# Patient Record
Sex: Female | Born: 1960 | Race: Black or African American | Hispanic: No | Marital: Married | Smoking: Current every day smoker
Health system: Southern US, Community
[De-identification: ages and names within clinical notes are randomized; demographics above are authoritative.]

## PROBLEM LIST (undated history)

## (undated) DIAGNOSIS — J189 Pneumonia, unspecified organism: Secondary | ICD-10-CM

## (undated) DIAGNOSIS — I340 Nonrheumatic mitral (valve) insufficiency: Secondary | ICD-10-CM

## (undated) DIAGNOSIS — N946 Dysmenorrhea, unspecified: Secondary | ICD-10-CM

## (undated) DIAGNOSIS — E119 Type 2 diabetes mellitus without complications: Secondary | ICD-10-CM

## (undated) DIAGNOSIS — I10 Essential (primary) hypertension: Secondary | ICD-10-CM

## (undated) DIAGNOSIS — I38 Endocarditis, valve unspecified: Secondary | ICD-10-CM

## (undated) DIAGNOSIS — F32A Depression, unspecified: Secondary | ICD-10-CM

## (undated) DIAGNOSIS — F329 Major depressive disorder, single episode, unspecified: Secondary | ICD-10-CM

## (undated) DIAGNOSIS — D509 Iron deficiency anemia, unspecified: Secondary | ICD-10-CM

## (undated) DIAGNOSIS — M1990 Unspecified osteoarthritis, unspecified site: Secondary | ICD-10-CM

## (undated) DIAGNOSIS — N920 Excessive and frequent menstruation with regular cycle: Secondary | ICD-10-CM

## (undated) HISTORY — PX: HAMMER TOE SURGERY: SHX385

## (undated) HISTORY — DX: Essential (primary) hypertension: I10

## (undated) HISTORY — DX: Iron deficiency anemia, unspecified: D50.9

## (undated) HISTORY — PX: KNEE ARTHROSCOPY: SUR90

## (undated) HISTORY — DX: Depression, unspecified: F32.A

## (undated) HISTORY — DX: Major depressive disorder, single episode, unspecified: F32.9

## (undated) HISTORY — DX: Nonrheumatic mitral (valve) insufficiency: I34.0

---

## 1997-10-29 ENCOUNTER — Inpatient Hospital Stay (HOSPITAL_COMMUNITY): Admission: EM | Admit: 1997-10-29 | Discharge: 1997-10-31 | Payer: Self-pay | Admitting: Emergency Medicine

## 1997-11-08 ENCOUNTER — Encounter: Admission: RE | Admit: 1997-11-08 | Discharge: 1997-11-08 | Payer: Self-pay | Admitting: Hematology and Oncology

## 1999-07-16 ENCOUNTER — Inpatient Hospital Stay (HOSPITAL_COMMUNITY): Admission: EM | Admit: 1999-07-16 | Discharge: 1999-08-01 | Payer: Self-pay | Admitting: Emergency Medicine

## 1999-07-16 ENCOUNTER — Encounter: Payer: Self-pay | Admitting: Emergency Medicine

## 1999-07-17 ENCOUNTER — Encounter: Payer: Self-pay | Admitting: Pulmonary Disease

## 1999-07-17 ENCOUNTER — Encounter: Payer: Self-pay | Admitting: Critical Care Medicine

## 1999-07-18 ENCOUNTER — Encounter: Payer: Self-pay | Admitting: Critical Care Medicine

## 1999-07-18 ENCOUNTER — Encounter: Payer: Self-pay | Admitting: Pulmonary Disease

## 1999-07-19 ENCOUNTER — Encounter: Payer: Self-pay | Admitting: Critical Care Medicine

## 1999-07-20 ENCOUNTER — Encounter: Payer: Self-pay | Admitting: Critical Care Medicine

## 1999-07-21 ENCOUNTER — Encounter: Payer: Self-pay | Admitting: Pulmonary Disease

## 1999-07-22 ENCOUNTER — Encounter: Payer: Self-pay | Admitting: Pulmonary Disease

## 1999-07-22 ENCOUNTER — Encounter: Payer: Self-pay | Admitting: Critical Care Medicine

## 1999-07-23 ENCOUNTER — Encounter: Payer: Self-pay | Admitting: Pulmonary Disease

## 1999-07-24 ENCOUNTER — Encounter: Payer: Self-pay | Admitting: Critical Care Medicine

## 1999-07-25 ENCOUNTER — Encounter: Payer: Self-pay | Admitting: Pulmonary Disease

## 1999-07-26 ENCOUNTER — Encounter: Payer: Self-pay | Admitting: Critical Care Medicine

## 1999-07-27 ENCOUNTER — Encounter: Payer: Self-pay | Admitting: Pulmonary Disease

## 1999-07-29 ENCOUNTER — Encounter: Payer: Self-pay | Admitting: Pulmonary Disease

## 1999-08-09 ENCOUNTER — Encounter: Payer: Self-pay | Admitting: Family Medicine

## 1999-08-09 ENCOUNTER — Ambulatory Visit (HOSPITAL_COMMUNITY): Admission: RE | Admit: 1999-08-09 | Discharge: 1999-08-09 | Payer: Self-pay | Admitting: Family Medicine

## 1999-09-27 ENCOUNTER — Encounter: Payer: Self-pay | Admitting: Internal Medicine

## 1999-09-27 ENCOUNTER — Ambulatory Visit (HOSPITAL_COMMUNITY): Admission: RE | Admit: 1999-09-27 | Discharge: 1999-09-27 | Payer: Self-pay | Admitting: Internal Medicine

## 2000-07-06 ENCOUNTER — Other Ambulatory Visit: Admission: RE | Admit: 2000-07-06 | Discharge: 2000-07-06 | Payer: Self-pay | Admitting: Family Medicine

## 2001-04-11 ENCOUNTER — Inpatient Hospital Stay (HOSPITAL_COMMUNITY): Admission: AD | Admit: 2001-04-11 | Discharge: 2001-04-14 | Payer: Self-pay | Admitting: *Deleted

## 2001-09-12 ENCOUNTER — Emergency Department (HOSPITAL_COMMUNITY): Admission: EM | Admit: 2001-09-12 | Discharge: 2001-09-12 | Payer: Self-pay | Admitting: Emergency Medicine

## 2001-10-29 ENCOUNTER — Emergency Department (HOSPITAL_COMMUNITY): Admission: EM | Admit: 2001-10-29 | Discharge: 2001-10-29 | Payer: Self-pay | Admitting: Emergency Medicine

## 2002-01-12 ENCOUNTER — Emergency Department (HOSPITAL_COMMUNITY): Admission: EM | Admit: 2002-01-12 | Discharge: 2002-01-12 | Payer: Self-pay | Admitting: Unknown Physician Specialty

## 2002-01-12 ENCOUNTER — Encounter: Payer: Self-pay | Admitting: Emergency Medicine

## 2002-01-30 ENCOUNTER — Encounter: Payer: Self-pay | Admitting: *Deleted

## 2002-01-30 ENCOUNTER — Emergency Department (HOSPITAL_COMMUNITY): Admission: EM | Admit: 2002-01-30 | Discharge: 2002-01-30 | Payer: Self-pay | Admitting: *Deleted

## 2002-02-06 ENCOUNTER — Ambulatory Visit (HOSPITAL_COMMUNITY): Admission: RE | Admit: 2002-02-06 | Discharge: 2002-02-06 | Payer: Self-pay | Admitting: Family Medicine

## 2002-02-06 ENCOUNTER — Encounter: Payer: Self-pay | Admitting: Family Medicine

## 2002-03-22 ENCOUNTER — Encounter: Admission: RE | Admit: 2002-03-22 | Discharge: 2002-04-21 | Payer: Self-pay | Admitting: Orthopedic Surgery

## 2002-04-01 ENCOUNTER — Emergency Department (HOSPITAL_COMMUNITY): Admission: EM | Admit: 2002-04-01 | Discharge: 2002-04-01 | Payer: Self-pay | Admitting: Emergency Medicine

## 2002-05-02 ENCOUNTER — Encounter: Payer: Self-pay | Admitting: Internal Medicine

## 2002-05-02 ENCOUNTER — Ambulatory Visit (HOSPITAL_COMMUNITY): Admission: RE | Admit: 2002-05-02 | Discharge: 2002-05-02 | Payer: Self-pay | Admitting: Internal Medicine

## 2002-05-11 ENCOUNTER — Ambulatory Visit (HOSPITAL_BASED_OUTPATIENT_CLINIC_OR_DEPARTMENT_OTHER): Admission: RE | Admit: 2002-05-11 | Discharge: 2002-05-11 | Payer: Self-pay | Admitting: Orthopedic Surgery

## 2002-05-26 ENCOUNTER — Encounter: Admission: RE | Admit: 2002-05-26 | Discharge: 2002-08-24 | Payer: Self-pay | Admitting: Orthopedic Surgery

## 2002-06-11 ENCOUNTER — Emergency Department (HOSPITAL_COMMUNITY): Admission: EM | Admit: 2002-06-11 | Discharge: 2002-06-11 | Payer: Self-pay | Admitting: Emergency Medicine

## 2002-06-13 ENCOUNTER — Emergency Department (HOSPITAL_COMMUNITY): Admission: EM | Admit: 2002-06-13 | Discharge: 2002-06-13 | Payer: Self-pay | Admitting: Emergency Medicine

## 2002-08-05 ENCOUNTER — Ambulatory Visit (HOSPITAL_BASED_OUTPATIENT_CLINIC_OR_DEPARTMENT_OTHER): Admission: RE | Admit: 2002-08-05 | Discharge: 2002-08-05 | Payer: Self-pay | Admitting: Pulmonary Disease

## 2003-03-15 ENCOUNTER — Emergency Department (HOSPITAL_COMMUNITY): Admission: EM | Admit: 2003-03-15 | Discharge: 2003-03-15 | Payer: Self-pay | Admitting: Emergency Medicine

## 2003-03-21 ENCOUNTER — Encounter: Payer: Self-pay | Admitting: Emergency Medicine

## 2003-03-21 ENCOUNTER — Emergency Department (HOSPITAL_COMMUNITY): Admission: EM | Admit: 2003-03-21 | Discharge: 2003-03-21 | Payer: Self-pay | Admitting: Emergency Medicine

## 2003-03-24 ENCOUNTER — Emergency Department (HOSPITAL_COMMUNITY): Admission: EM | Admit: 2003-03-24 | Discharge: 2003-03-24 | Payer: Self-pay | Admitting: Emergency Medicine

## 2004-03-12 ENCOUNTER — Ambulatory Visit: Payer: Self-pay | Admitting: Family Medicine

## 2004-07-16 ENCOUNTER — Ambulatory Visit: Payer: Self-pay | Admitting: Family Medicine

## 2004-07-16 ENCOUNTER — Other Ambulatory Visit: Admission: RE | Admit: 2004-07-16 | Discharge: 2004-07-16 | Payer: Self-pay | Admitting: Family Medicine

## 2004-07-25 ENCOUNTER — Ambulatory Visit (HOSPITAL_COMMUNITY): Admission: RE | Admit: 2004-07-25 | Discharge: 2004-07-25 | Payer: Self-pay | Admitting: Family Medicine

## 2004-10-24 ENCOUNTER — Emergency Department (HOSPITAL_COMMUNITY): Admission: EM | Admit: 2004-10-24 | Discharge: 2004-10-24 | Payer: Self-pay | Admitting: Emergency Medicine

## 2004-11-04 ENCOUNTER — Ambulatory Visit (HOSPITAL_COMMUNITY): Admission: RE | Admit: 2004-11-04 | Discharge: 2004-11-04 | Payer: Self-pay | Admitting: Internal Medicine

## 2004-11-08 ENCOUNTER — Ambulatory Visit: Payer: Self-pay | Admitting: Family Medicine

## 2004-11-29 ENCOUNTER — Ambulatory Visit: Payer: Self-pay | Admitting: Family Medicine

## 2004-11-30 ENCOUNTER — Ambulatory Visit (HOSPITAL_COMMUNITY): Admission: RE | Admit: 2004-11-30 | Discharge: 2004-11-30 | Payer: Self-pay | Admitting: Internal Medicine

## 2004-12-04 ENCOUNTER — Ambulatory Visit: Payer: Self-pay | Admitting: Family Medicine

## 2004-12-24 ENCOUNTER — Ambulatory Visit: Payer: Self-pay | Admitting: Family Medicine

## 2005-03-18 ENCOUNTER — Ambulatory Visit: Payer: Self-pay | Admitting: Family Medicine

## 2005-03-25 ENCOUNTER — Ambulatory Visit: Payer: Self-pay | Admitting: Family Medicine

## 2005-05-20 ENCOUNTER — Ambulatory Visit: Payer: Self-pay | Admitting: Internal Medicine

## 2005-07-23 ENCOUNTER — Ambulatory Visit (HOSPITAL_COMMUNITY): Admission: RE | Admit: 2005-07-23 | Discharge: 2005-07-23 | Payer: Self-pay | Admitting: Internal Medicine

## 2005-07-31 ENCOUNTER — Ambulatory Visit (HOSPITAL_COMMUNITY): Admission: RE | Admit: 2005-07-31 | Discharge: 2005-07-31 | Payer: Self-pay | Admitting: Family Medicine

## 2005-07-31 ENCOUNTER — Ambulatory Visit: Payer: Self-pay | Admitting: Family Medicine

## 2005-08-26 ENCOUNTER — Ambulatory Visit: Payer: Self-pay | Admitting: Family Medicine

## 2005-11-03 ENCOUNTER — Ambulatory Visit: Payer: Self-pay | Admitting: Family Medicine

## 2005-11-04 ENCOUNTER — Encounter (INDEPENDENT_AMBULATORY_CARE_PROVIDER_SITE_OTHER): Payer: Self-pay | Admitting: Family Medicine

## 2005-11-04 LAB — CONVERTED CEMR LAB

## 2005-11-24 ENCOUNTER — Ambulatory Visit: Payer: Self-pay | Admitting: Family Medicine

## 2005-12-08 ENCOUNTER — Ambulatory Visit: Payer: Self-pay | Admitting: *Deleted

## 2006-03-31 ENCOUNTER — Emergency Department (HOSPITAL_COMMUNITY): Admission: EM | Admit: 2006-03-31 | Discharge: 2006-03-31 | Payer: Self-pay | Admitting: Emergency Medicine

## 2006-07-13 ENCOUNTER — Ambulatory Visit: Payer: Self-pay | Admitting: Family Medicine

## 2006-07-21 ENCOUNTER — Telehealth: Payer: Self-pay | Admitting: *Deleted

## 2006-08-04 ENCOUNTER — Ambulatory Visit (HOSPITAL_COMMUNITY): Admission: RE | Admit: 2006-08-04 | Discharge: 2006-08-04 | Payer: Self-pay | Admitting: Internal Medicine

## 2006-10-06 ENCOUNTER — Ambulatory Visit: Admission: RE | Admit: 2006-10-06 | Discharge: 2006-10-06 | Payer: Self-pay | Admitting: Internal Medicine

## 2006-10-06 ENCOUNTER — Ambulatory Visit: Payer: Self-pay | Admitting: Family Medicine

## 2006-10-08 ENCOUNTER — Ambulatory Visit: Payer: Self-pay | Admitting: Internal Medicine

## 2006-12-08 ENCOUNTER — Ambulatory Visit: Payer: Self-pay | Admitting: Family Medicine

## 2006-12-08 ENCOUNTER — Encounter (INDEPENDENT_AMBULATORY_CARE_PROVIDER_SITE_OTHER): Payer: Self-pay | Admitting: Family Medicine

## 2006-12-16 ENCOUNTER — Encounter (INDEPENDENT_AMBULATORY_CARE_PROVIDER_SITE_OTHER): Payer: Self-pay | Admitting: Family Medicine

## 2006-12-16 DIAGNOSIS — I10 Essential (primary) hypertension: Secondary | ICD-10-CM | POA: Insufficient documentation

## 2006-12-16 DIAGNOSIS — D509 Iron deficiency anemia, unspecified: Secondary | ICD-10-CM | POA: Insufficient documentation

## 2006-12-16 DIAGNOSIS — M109 Gout, unspecified: Secondary | ICD-10-CM | POA: Insufficient documentation

## 2006-12-16 DIAGNOSIS — F17200 Nicotine dependence, unspecified, uncomplicated: Secondary | ICD-10-CM | POA: Insufficient documentation

## 2007-03-15 ENCOUNTER — Ambulatory Visit: Payer: Self-pay | Admitting: Internal Medicine

## 2007-03-15 LAB — CONVERTED CEMR LAB: TSH: 1.488 microintl units/mL (ref 0.350–5.50)

## 2007-03-24 ENCOUNTER — Encounter (INDEPENDENT_AMBULATORY_CARE_PROVIDER_SITE_OTHER): Payer: Self-pay | Admitting: *Deleted

## 2007-09-07 ENCOUNTER — Emergency Department (HOSPITAL_COMMUNITY): Admission: EM | Admit: 2007-09-07 | Discharge: 2007-09-07 | Payer: Self-pay | Admitting: Family Medicine

## 2007-09-16 ENCOUNTER — Ambulatory Visit (HOSPITAL_COMMUNITY): Admission: RE | Admit: 2007-09-16 | Discharge: 2007-09-16 | Payer: Self-pay | Admitting: Family Medicine

## 2007-10-06 ENCOUNTER — Ambulatory Visit: Payer: Self-pay | Admitting: Internal Medicine

## 2007-11-16 ENCOUNTER — Emergency Department (HOSPITAL_COMMUNITY): Admission: EM | Admit: 2007-11-16 | Discharge: 2007-11-17 | Payer: Self-pay | Admitting: Emergency Medicine

## 2007-12-14 ENCOUNTER — Ambulatory Visit: Payer: Self-pay | Admitting: Family Medicine

## 2007-12-14 ENCOUNTER — Encounter (INDEPENDENT_AMBULATORY_CARE_PROVIDER_SITE_OTHER): Payer: Self-pay | Admitting: Family Medicine

## 2007-12-14 LAB — CONVERTED CEMR LAB
ALT: 14 units/L (ref 0–35)
AST: 18 units/L (ref 0–37)
Albumin: 3.9 g/dL (ref 3.5–5.2)
Alkaline Phosphatase: 81 units/L (ref 39–117)
BUN: 14 mg/dL (ref 6–23)
CO2: 23 meq/L (ref 19–32)
Calcium: 9.1 mg/dL (ref 8.4–10.5)
Chloride: 106 meq/L (ref 96–112)
Cholesterol: 132 mg/dL (ref 0–200)
Creatinine, Ser: 1.01 mg/dL (ref 0.40–1.20)
Glucose, Bld: 106 mg/dL — ABNORMAL HIGH (ref 70–99)
HDL: 89 mg/dL (ref >39)
LDL Cholesterol: 26 mg/dL (ref 0–99)
Potassium: 4.4 meq/L (ref 3.5–5.3)
Sodium: 139 meq/L (ref 135–145)
Total Bilirubin: 0.6 mg/dL (ref 0.3–1.2)
Total CHOL/HDL Ratio: 1.5
Total Protein: 7.2 g/dL (ref 6.0–8.3)
Triglycerides: 83 mg/dL (ref <150)
VLDL: 17 mg/dL (ref 0–40)

## 2008-04-01 ENCOUNTER — Inpatient Hospital Stay (HOSPITAL_COMMUNITY): Admission: EM | Admit: 2008-04-01 | Discharge: 2008-04-08 | Payer: Self-pay | Admitting: Emergency Medicine

## 2008-04-03 ENCOUNTER — Encounter (INDEPENDENT_AMBULATORY_CARE_PROVIDER_SITE_OTHER): Payer: Self-pay | Admitting: Internal Medicine

## 2008-04-20 ENCOUNTER — Ambulatory Visit: Payer: Self-pay | Admitting: Internal Medicine

## 2008-09-04 ENCOUNTER — Emergency Department (HOSPITAL_COMMUNITY): Admission: EM | Admit: 2008-09-04 | Discharge: 2008-09-04 | Payer: Self-pay | Admitting: Family Medicine

## 2008-10-03 ENCOUNTER — Ambulatory Visit (HOSPITAL_COMMUNITY): Admission: RE | Admit: 2008-10-03 | Discharge: 2008-10-03 | Payer: Self-pay | Admitting: Family Medicine

## 2008-11-02 ENCOUNTER — Ambulatory Visit: Payer: Self-pay | Admitting: Internal Medicine

## 2008-12-07 ENCOUNTER — Emergency Department (HOSPITAL_COMMUNITY): Admission: EM | Admit: 2008-12-07 | Discharge: 2008-12-07 | Payer: Self-pay | Admitting: Emergency Medicine

## 2009-01-29 ENCOUNTER — Emergency Department (HOSPITAL_COMMUNITY): Admission: EM | Admit: 2009-01-29 | Discharge: 2009-01-29 | Payer: Self-pay | Admitting: Emergency Medicine

## 2009-04-01 ENCOUNTER — Emergency Department (HOSPITAL_COMMUNITY): Admission: EM | Admit: 2009-04-01 | Discharge: 2009-04-01 | Payer: Self-pay | Admitting: Emergency Medicine

## 2009-04-03 ENCOUNTER — Emergency Department (HOSPITAL_COMMUNITY): Admission: EM | Admit: 2009-04-03 | Discharge: 2009-04-03 | Payer: Self-pay | Admitting: Family Medicine

## 2009-04-09 ENCOUNTER — Emergency Department (HOSPITAL_COMMUNITY): Admission: EM | Admit: 2009-04-09 | Discharge: 2009-04-09 | Payer: Self-pay | Admitting: Emergency Medicine

## 2009-04-12 ENCOUNTER — Ambulatory Visit: Payer: Self-pay | Admitting: Internal Medicine

## 2009-04-26 ENCOUNTER — Telehealth (INDEPENDENT_AMBULATORY_CARE_PROVIDER_SITE_OTHER): Payer: Self-pay | Admitting: *Deleted

## 2009-08-30 ENCOUNTER — Emergency Department (HOSPITAL_COMMUNITY): Admission: EM | Admit: 2009-08-30 | Discharge: 2009-08-30 | Payer: Self-pay | Admitting: Family Medicine

## 2009-10-04 ENCOUNTER — Ambulatory Visit (HOSPITAL_COMMUNITY): Admission: RE | Admit: 2009-10-04 | Discharge: 2009-10-04 | Payer: Self-pay | Admitting: Internal Medicine

## 2009-11-08 ENCOUNTER — Ambulatory Visit: Payer: Self-pay | Admitting: Internal Medicine

## 2009-12-12 ENCOUNTER — Ambulatory Visit: Payer: Self-pay | Admitting: Internal Medicine

## 2009-12-13 LAB — CONVERTED CEMR LAB: Pap Smear: NEGATIVE

## 2009-12-26 ENCOUNTER — Ambulatory Visit: Payer: Self-pay | Admitting: Internal Medicine

## 2009-12-26 ENCOUNTER — Encounter: Payer: Self-pay | Admitting: Cardiovascular Disease

## 2009-12-26 LAB — CONVERTED CEMR LAB
Basophils Absolute: 0 10*3/uL (ref 0.0–0.1)
Basophils Relative: 0 % (ref 0–1)
Eosinophils Absolute: 0.1 10*3/uL (ref 0.0–0.7)
Eosinophils Relative: 1 % (ref 0–5)
HCT: 35.8 % — ABNORMAL LOW (ref 36.0–46.0)
Hemoglobin: 11.4 g/dL — ABNORMAL LOW (ref 12.0–15.0)
Lymphocytes Relative: 35 % (ref 12–46)
Lymphs Abs: 1.8 10*3/uL (ref 0.7–4.0)
MCHC: 31.8 g/dL (ref 30.0–36.0)
MCV: 85.6 fL (ref 78.0–100.0)
Monocytes Absolute: 0.4 10*3/uL (ref 0.1–1.0)
Monocytes Relative: 8 % (ref 3–12)
Neutro Abs: 2.8 10*3/uL (ref 1.7–7.7)
Neutrophils Relative %: 55 % (ref 43–77)
Platelets: 323 10*3/uL (ref 150–400)
RBC: 4.18 M/uL (ref 3.87–5.11)
RDW: 15.1 % (ref 11.5–15.5)
Retic Ct Pct: 1.5 % (ref 0.4–3.1)
WBC: 5.1 10*3/uL (ref 4.0–10.5)

## 2009-12-27 ENCOUNTER — Ambulatory Visit (HOSPITAL_COMMUNITY): Admission: RE | Admit: 2009-12-27 | Discharge: 2009-12-27 | Payer: Self-pay | Admitting: Internal Medicine

## 2010-01-15 ENCOUNTER — Encounter: Payer: Self-pay | Admitting: Internal Medicine

## 2010-01-15 ENCOUNTER — Encounter: Payer: Self-pay | Admitting: Cardiovascular Disease

## 2010-01-15 ENCOUNTER — Ambulatory Visit (HOSPITAL_COMMUNITY): Admission: RE | Admit: 2010-01-15 | Discharge: 2010-01-15 | Payer: Self-pay | Admitting: Internal Medicine

## 2010-01-25 ENCOUNTER — Encounter: Payer: Self-pay | Admitting: Cardiovascular Disease

## 2010-02-25 ENCOUNTER — Ambulatory Visit (HOSPITAL_COMMUNITY): Admission: RE | Admit: 2010-02-25 | Discharge: 2010-02-25 | Payer: Self-pay | Admitting: Internal Medicine

## 2010-03-04 DIAGNOSIS — F32A Depression, unspecified: Secondary | ICD-10-CM | POA: Insufficient documentation

## 2010-03-04 DIAGNOSIS — F329 Major depressive disorder, single episode, unspecified: Secondary | ICD-10-CM

## 2010-04-18 ENCOUNTER — Encounter (INDEPENDENT_AMBULATORY_CARE_PROVIDER_SITE_OTHER): Payer: Self-pay | Admitting: *Deleted

## 2010-04-26 ENCOUNTER — Ambulatory Visit: Payer: Self-pay | Admitting: Cardiovascular Disease

## 2010-04-29 ENCOUNTER — Telehealth: Payer: Self-pay | Admitting: Cardiovascular Disease

## 2010-06-17 ENCOUNTER — Ambulatory Visit: Payer: Self-pay | Admitting: Cardiovascular Disease

## 2010-08-06 NOTE — Letter (Addendum)
Summary: [facility]: Referral Form  [facility]: Referral Form   Imported By: Earl Many 03/05/2010 11:55:39  _____________________________________________________________________  External Attachment:    Type:   Image     Comment:   External Document

## 2010-08-06 NOTE — Progress Notes (Addendum)
  Phone Note Call from Patient   Caller: Patient Reason for Call: Talk to Nurse Complaint: Cough/Sore throat Action Taken: Patient advised to go to ER Summary of Call: pt called to say she was no better from her 07/15/06 visit wants to be seen, informed no available appts for the remainder of week, instructed to go to urgent care or er. pt agreeable Initial call taken by: Marin Roberts RN,  July 21, 2006 4:07 PM

## 2010-08-06 NOTE — Consult Note (Addendum)
Summary: [facility]   Imported By: Earl Many 03/05/2010 11:52:32  _____________________________________________________________________  External Attachment:    Type:   Image     Comment:   External Document

## 2010-08-06 NOTE — Assessment & Plan Note (Addendum)
Summary: np3/abn echo/mt  Medications Added WELLBUTRIN XL 300 MG XR24H-TAB (BUPROPION HCL) 1 tab by mouth once daily LOSARTAN POTASSIUM 25 MG TABS (LOSARTAN POTASSIUM) 1 once daily      Allergies Added: NKDA  CC:  referal from dr Inocente Salles abnormal echo.  History of Present Illness: 50 yo referred for murmur and abnormal echo.  Reviewed echo read by Dr Lynnea Ferrier in September.  He indicated severe MR with no frank prolapse or flail.  LV size and funciton normal.  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  Denies appetite suppresant use, RF, SBE.  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    I reviewed the echo and thought the MR was more moderate and in the absence of symptoms or clearly repairable lesion there is no indicatoin for agressive w/u at this time  Discussed this with patient including diagnosis of MR and warning signs of symptomatic progression.  BP suboptimally controlled and will add Cozzaar to med list for further afterload reduction.    Current Problems (verified): 1)  Severe Regurgitation  () 2)  Hypertension, Essential Nos  (ICD-401.9) 3)  Depression  (ICD-311) 4)  Tobacco Abuse  (ICD-305.1) 5)  Gout  (ICD-274.9) 6)  Anemia, Iron Deficiency Nos  (ICD-280.9)  Current Medications (verified): 1)  Lasix 20 Mg Tabs (Furosemide) .... Every Am 2)  Atenolol 50 Mg Tabs (Atenolol) .... Once Daily 3)  Prozac 20 Mg Caps (Fluoxetine Hcl) .... Once Daily 4)  Wellbutrin Xl 300 Mg Xr24h-Tab (Bupropion Hcl) .Marland Kitchen.. 1 Tab By Mouth Once Daily 5)  Losartan Potassium 25 Mg Tabs (Losartan Potassium) .Marland Kitchen.. 1 Once Daily  Allergies (verified): No Known Drug Allergies  Past History:  Past Medical History: Last updated: 03/04/2010 Mitral Regurgitation HYPERTENSION, ESSENTIAL NOS  DEPRESSION1) TOBACCO ABUSE  GOUT  ANEMIA, IRON DEFICIENCY NOS   Past Surgical History: Last updated: 03/04/2010 Left knee arthroscopic partial  medial meniscectomy, with debridement of chondromalacia.  Family History: Last updated: 04/26/2010 Sister and brother with heart problems one needing valve replacement but did drugs  Social History: Last updated: 04/26/2010 Tobacco Use - Yes.  Single mother living with daughter Works as Conservation officer, nature Sedentary Previous crack use  Family History: Sister and brother with heart problems one needing valve replacement but did drugs  Social History: Tobacco Use - Yes.  Single mother living with daughter Works as Conservation officer, nature Sedentary Previous crack use  Review of Systems       Denies fever, malais, weight loss, blurry vision, decreased visual acuity, cough, sputum, SOB, hemoptysis, pleuritic pain, palpitaitons, heartburn, abdominal pain, melena, lower extremity edema, claudication, or rash.   Vital Signs:  Patient profile:   50 year old female Height:      67 inches Weight:      225 pounds BMI:     35.37 Pulse rate:   73 / minute Resp:     14 per minute BP sitting:   150 / 80  (left arm)  Vitals Entered By: Kem Parkinson (April 26, 2010 3:39 PM)  Physical Exam  General:  Affect appropriate Healthy:  appears stated age HEENT: normal Neck supple with no adenopathy JVP normal no bruits no thyromegaly Lungs clear with no wheezing and good diaphragmatic motion Heart:  S1/S2 MR diffiuct to hear  murmur,rub, gallop or click PMI normal Abdomen: benighn, BS positve, no tenderness, no AAA no bruit.  No HSM or HJR Distal pulses intact with no  bruits No edema Neuro non-focal Skin warm and dry    Impression & Recommendations:  Problem # 1:  * SEVERE  REGURGITATION Asymptomatic with no easily repairable morphology and more moderate to my review.  Better control BP and F/U echo in 6 months  Problem # 2:  HYPERTENSION, ESSENTIAL NOS (ICD-401.9) Add Cozaar and reevaluate with BMET in 4 weeks Her updated medication list for this problem includes:    Lasix 20 Mg Tabs  (Furosemide) ..... Every am    Atenolol 50 Mg Tabs (Atenolol) ..... Once daily    Losartan Potassium 25 Mg Tabs (Losartan potassium) .Marland Kitchen... 1 once daily  Problem # 3:  TOBACCO ABUSE (ICD-305.1) Counseled for less than 10 minutes.  Risks of lung and vascular disease discussed.  Currently not motivated to quit  Other Orders: Echocardiogram (Echo)  Patient Instructions: 1)  Your physician recommends that you schedule a follow-up appointment in: MARCH 2012 WITH ECHO SAME DAY 2)  Your physician has requested that you have an echocardiogram.  Echocardiography is a painless test that uses sound waves to create images of your heart. It provides your doctor with information about the size and shape of your heart and how well your heart's chambers and valves are working.  This procedure takes approximately one hour. There are no restrictions for this procedure.3 /2012 SEE DR Eden Emms SAME DAY 3)  Your physician recommends that you return for lab work in:4 WEEKS AFTER START BMET BNP V58.69 4)  Your physician has recommended you make the following change in your medication: ADD LOSARATN 25 MG 1 once daily Prescriptions: LOSARTAN POTASSIUM 25 MG TABS (LOSARTAN POTASSIUM) 1 once daily  #30 x 11   Entered by:   Scherrie Bateman, LPN   Authorized by:   Colon Branch, MD, [facility]   Signed by:   Scherrie Bateman, LPN on 16/04/9603   Method used:   Electronically to        [facility] [address] (retail)       [phone]       [place], [place]  [address]       Ph: [phone]       Fax: [phone]   RxID:   [id number]    EKG Report  Procedure date:  04/26/2010  Findings:      NSR 100 Nonspecific ST/T wave changes Abnormal ECG

## 2010-08-06 NOTE — Progress Notes (Addendum)
Summary: pt wants to know if she can get meds at [facility]   Phone Note Call from Patient Call back at [phone]   Caller: Patient Reason for Call: Talk to Nurse, Talk to Doctor Summary of Call: pt was perscribed medication friday and she was told it was $4 at [facility] and it was $41 can you see if they have it at [facility] and leave a message at her daughters phone listed above Initial call taken by: Omer Jack,  April 29, 2010 8:36 AM  Follow-up for Phone Call        [facility] and gave verbal prescription for Losartan 25 mg by mouth daily #30, 11 refills as per office note on 04/26/10. Can be picked up tomorrow.  I called the call back number given which is pt's daughter. Pt is at work and is unavailable. Message left with daughter that medication has been called to pharmacy pt requested and that it can be picked up tomorrow. Follow-up by: Dossie Arbour, RN, BSN,  April 29, 2010 9:36 AM

## 2010-08-06 NOTE — Miscellaneous (Addendum)
Summary: VIP  Patient: Stephanie Joseph Note: All result statuses are Final unless otherwise noted.  Tests: (1) VIP (Medications)   LLIMPORTMEDS              "Result Below..."       RESULT: ORTHO TRI-CYCLEN LO TABS 0.025 MG*TAKE ONE (1) TABLET BY MOUTH EVERY DAY*01/25/2007*Last Refill: 02/24/2007*[id number]*******   LLIMPORTMEDS              "Result Below..."       RESULT: FUROSEMIDE TABS 20 MG*TAKE ONE (1) TABLET EVERY MORNING   Generic for LASIX 20MG  TAB*04/13/2007*Last Refill: Ila.[email or web address]*******   LLIMPORTMEDS              "Result Below..."       RESULT: AZITHROMYCIN TABS 250 MG*TAKE 2 TABLETS TODAY, THEN TAKE 1 TABLET DAILY UNTIL GONE.  Generic for ZITHROMAX 250MG  TA*10/06/2006*Last Refill: MWNUUVO*[id number]*******   LLIMPORTMEDS              "Result Below..."       RESULT: ATENOLOL TABS 50 MG*TAKE ONE (1) TABLET EACH DAY*04/13/2007*Last Refill: Unknown*2012*******   LLIMPORTALLS              NKDA***  Note: An exclamation mark (!) indicates a result that was not dispersed into the flowsheet. Document Creation Date: 05/06/2007 3:02 PM _______________________________________________________________________  (1) Order result status: Final Collection or observation date-time: 03/24/2007 Requested date-time: 03/24/2007 Receipt date-time:  Reported date-time: 03/24/2007 Referring Physician:   Ordering Physician:   Specimen Source:  Source: [place] Order Number:  Lab site:

## 2010-08-08 NOTE — Assessment & Plan Note (Signed)
Summary: bp check/lg  Nurse Visit   Vital Signs:  Patient profile:   50 year old female Pulse rate:   76 / minute Resp:     20 per minute BP sitting:   136 / 72  (right arm)

## 2010-08-08 NOTE — Letter (Addendum)
Summary: [facility] Office Visit Note   [facility] Office Visit Note   Imported By: Roderic Ovens 04/10/2010 12:05:51  _____________________________________________________________________  External Attachment:    Type:   Image     Comment:   External Document

## 2010-08-08 NOTE — Letter (Signed)
Summary: Appointment - Missed  [facility], Main Office  [address]   [place], [place] [address]   Phone: [phone]  Fax: [phone]         April 18, 2010 MRN: [id number]      [facility] [address] [place], [place]  [address]     Dear Ms. [facility],  Our records indicate you missed your appointment on March 05, 2010 at 10:45am with Dr. Eden Emms. It is very important that we reach you to reschedule this appointment. We look forward to participating in your health care needs. Please contact us at the number listed above at your earliest convenience to reschedule this appointment.     Sincerely,   Glass blower/designer

## 2010-08-30 ENCOUNTER — Ambulatory Visit (INDEPENDENT_AMBULATORY_CARE_PROVIDER_SITE_OTHER): Payer: Self-pay

## 2010-08-30 ENCOUNTER — Inpatient Hospital Stay (INDEPENDENT_AMBULATORY_CARE_PROVIDER_SITE_OTHER)
Admission: RE | Admit: 2010-08-30 | Discharge: 2010-08-30 | Disposition: A | Discharge status: Home / Self Care | Payer: Self-pay | Source: Ambulatory Visit | Attending: Family Medicine | Admitting: Family Medicine

## 2010-08-30 DIAGNOSIS — J45909 Unspecified asthma, uncomplicated: Secondary | ICD-10-CM

## 2010-09-11 ENCOUNTER — Encounter: Payer: Self-pay | Admitting: Cardiovascular Disease

## 2010-09-16 ENCOUNTER — Other Ambulatory Visit (HOSPITAL_COMMUNITY): Payer: Self-pay | Admitting: Family Medicine

## 2010-09-16 DIAGNOSIS — Z1231 Encounter for screening mammogram for malignant neoplasm of breast: Secondary | ICD-10-CM

## 2010-09-24 ENCOUNTER — Other Ambulatory Visit (HOSPITAL_COMMUNITY): Payer: Self-pay | Admitting: Cardiovascular Disease

## 2010-09-24 DIAGNOSIS — I340 Nonrheumatic mitral (valve) insufficiency: Secondary | ICD-10-CM

## 2010-09-25 ENCOUNTER — Encounter: Payer: Self-pay | Admitting: Cardiovascular Disease

## 2010-09-25 ENCOUNTER — Ambulatory Visit (HOSPITAL_COMMUNITY): Payer: Self-pay | Attending: Cardiovascular Disease | Admitting: Radiology

## 2010-09-25 ENCOUNTER — Ambulatory Visit (INDEPENDENT_AMBULATORY_CARE_PROVIDER_SITE_OTHER): Payer: Self-pay | Admitting: Cardiovascular Disease

## 2010-09-25 VITALS — BP 120/84 | HR 84 | Ht 67.0 in | Wt 225.8 lb

## 2010-09-25 DIAGNOSIS — I080 Rheumatic disorders of both mitral and aortic valves: Secondary | ICD-10-CM | POA: Insufficient documentation

## 2010-09-25 DIAGNOSIS — I059 Rheumatic mitral valve disease, unspecified: Secondary | ICD-10-CM

## 2010-09-25 DIAGNOSIS — I340 Nonrheumatic mitral (valve) insufficiency: Secondary | ICD-10-CM

## 2010-09-25 DIAGNOSIS — I10 Essential (primary) hypertension: Secondary | ICD-10-CM | POA: Insufficient documentation

## 2010-09-25 DIAGNOSIS — F17200 Nicotine dependence, unspecified, uncomplicated: Secondary | ICD-10-CM | POA: Insufficient documentation

## 2010-09-25 NOTE — Progress Notes (Signed)
50 yo referred for murmur and abnormal echo.  Reviewed echo read by Dr Lynnea Ferrier in September.  He indicated severe MR with no frank prolapse or flail.  LV size and funciton normal.  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  Denies appetite suppresant use, RF, SBE.  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    I reviewed the echo and thought the MR was more moderate and in the absence of symptoms or clearly repairable lesion there is no indicatoin for agressive w/u at this time  Discussed this with patient including diagnosis of MR and warning signs of symptomatic progression.  BP suboptimally controlled and will add Cozzaar to med list for further afterload reduction.

## 2010-09-25 NOTE — Patient Instructions (Signed)
Your physician recommends that you schedule a follow-up appointment in 1 year Your physician has requested that you have an echocardiogram in 1 year. Echocardiography is a painless test that uses sound waves to create images of your heart. It provides your doctor with information about the size and shape of your heart and how well your heart's chambers and valves are working. This procedure takes approximately one hour. There are no restrictions for this procedure.

## 2010-10-07 ENCOUNTER — Ambulatory Visit (HOSPITAL_COMMUNITY)
Admission: RE | Admit: 2010-10-07 | Discharge: 2010-10-07 | Disposition: A | Discharge status: Home / Self Care | Payer: Self-pay | Source: Ambulatory Visit | Attending: Family Medicine | Admitting: Family Medicine

## 2010-10-07 DIAGNOSIS — Z1231 Encounter for screening mammogram for malignant neoplasm of breast: Secondary | ICD-10-CM | POA: Insufficient documentation

## 2010-11-19 NOTE — Consult Note (Signed)
NAMEMarland Kitchen  MANA, HABERL NO.:  [id number]   MEDICAL RECORD NO.:  [id number]          PATIENT TYPE:  INP   LOCATION:  1428                         FACILITY:  [facility]   PHYSICIAN:  Armanda Magic, M.D.     DATE OF BIRTH:  03-20-1961   DATE OF CONSULTATION:  04/06/2008  DATE OF DISCHARGE:                                 CONSULTATION   REFERRING PHYSICIAN:  Isidor Holts, MD, [facility].   CHIEF COMPLAINT:  Shortness of breath.   HISTORY OF PRESENT ILLNESS:  This is a 50 year old African American  obese female who was admitted after a 1-week history of progressive  dyspnea on exertion.  About a week ago, she took a long train ride to  [place].  Apparently, she was having some mild shortness of breath  prior to that, but then after the trip, she noticed increasing dyspnea  on exertion with chest pressure and brought to the emergency room visit.  She denies any past medical history of coronary artery disease or CHF,  but apparently there was some mention about CHF in 2007, although she is  unaware of this.  She was told back in 2001 that she had a heart murmur.  She has seen [facility] in the past for pneumonia in 2001.  Her  chest x-ray on admission showed cardiomegaly and vascular congestion.  A  2-D echocardiogram showed mild to moderate aortic insufficiency and  moderate to severe mitral regurgitation with normal LV systolic  function.  We are now asked to consult.  She denies any recent night  sweats, although she did have some fever and chills when she was  recently admitted, which was felt to be due to bronchitis/pneumonia.  She is currently on antibiotics and prednisone.  She does have a history  of tobacco use in the past.  She has no history of IV drug use and blood  cultures have been negative.   PAST MEDICAL HISTORY:  Includes hypertension, depression, and pneumonia.   ALLERGIES:  None.   MEDICATIONS:  Include:  1. Norvasc 5 mg a day.  2.  Pepcid 20 mg b.i.d.  3. Lasix 40 mg a day.  4. Solu-Medrol 100 mg q.6 h.  5. NicoDerm.  6. K-Dur 20 mEq q.12 h.  7. Prednisone 40 mg daily.  8. Lasix 40 mg a day.   SOCIAL HISTORY:  She is married.  She lives in [place].  She smokes  one-half pack of cigarettes per day.  She drinks up to a 6-pack of beer  a day.  She is currently on disability, but works as a Passenger transport manager.  She used to do snort cocaine, but the last time she did was 10 years  ago.   FAMILY HISTORY:  Her mother is deceased and father is deceased, but no  family history of coronary artery disease.  She has a brother with  asthma.   REVIEW OF SYSTEMS:  Otherwise as stated in HPI includes occasional  dyspnea on exertion and chest pain, otherwise negative.   PHYSICAL EXAMINATION:  VITAL SIGNS:  Blood pressure is 132/87, pulse 76,  respirations 22, she is afebrile, and O2 saturation 96% on room air.  GENERAL:  She appears comfortable.  HEENT:  Benign.  NECK:  Supple without lymphadenopathy.  Carotid upstrokes +2  bilaterally.  No bruits.  LUNGS:  Clear to auscultation throughout.  HEART:  Regular rate and rhythm with a 1/6 diastolic murmur at the left  lower sternal border and a 1/6 systolic murmur at the apex.  There are  some late expiratory wheezes at the left base, both anteriorly and  posteriorly.  ABDOMEN:  Benign.  EXTREMITIES:  No edema.   A 2-D echocardiogram showed normal LV function with EF of 60% with no  regional wall motion abnormalities, mild to moderate aortic  insufficiency, moderate to severe mitral regurgitation, moderate  tricuspid regurgitation, RV systolic pressure of 35-45 mmHg, PA systolic  pressure estimated 47 mmHg.  Chest x-ray showed cardiomegaly with  vascular congestion.   LABORATORY DATA:  Magnesium 2.  Blood cultures negative x2.  BNP 934 and  406.  Free T4 1.2, TSH 0.162, and T3 of 2.9.  White cell count 9.2,  hemoglobin 9.1, hematocrit 28.6, and platelet count 250.   Sodium 138,  potassium 4.1, chloride 105, bicarb 28, BUN 9, creatinine 0.74, and  glucose 133.  CPKs are 220, 218, and 207.  MB is 1.6, 2.1, and 1.2.  Troponin is 0.01 x2 and 0.02.  EKG shows normal sinus rhythm with no ST  changes except for laterally where they are nonspecific.   ASSESSMENT:  1. Dyspnea, multifactorial secondary to mitral regurgitation as well      as to recent bronchitis.  Also need to consider acute pulmonary      embolism given her recent trip.  2. Aortic and mitral regurgitation of unclear etiology.  I doubt this      is due to endocarditis given there was no vegetation on 2-D      echocardiogram and blood cultures of negative.  3. Hypertension, well controlled.  4. Cardiomegaly by chest x-ray, but 2-D echocardiogram shows normal      left ventricular size and systolic function.  5. History of depression.   PLAN:  I have recommended continuing her Lasix.  We will check a stat D-  dimer.  I will take a look at the 2-D echocardiogram.  If it does appear  that there is significant mitral regurgitation more than moderate, then  we will proceed with transesophageal echo to try to determine the  etiology of her mitral regurgitation.      Armanda Magic, M.D.  Electronically Signed     TT/MEDQ  D:  04/06/2008  T:  04/07/2008  Job:  [id number]   cc:   Melvern Banker

## 2010-11-19 NOTE — H&P (Signed)
NAMEMarland Joseph  ASHUNTI, SCHOFIELD NO.:  [id number]   MEDICAL RECORD NO.:  [id number]          PATIENT TYPE:  INP   LOCATION:  1428                         FACILITY:  [facility]   PHYSICIAN:  Della Goo, M.D. DATE OF BIRTH:  [birth year]   DATE OF ADMISSION:  04/01/2008  DATE OF DISCHARGE:                              HISTORY & PHYSICAL   PRIMARY CARE PHYSICIAN:  [facility].   CHIEF COMPLAINT:  Shortness of breath, cough.   HISTORY OF PRESENT ILLNESS:  This 50 year old female presenting to the  emergency department with complaints of worsening shortness of breath  over the past 3 days along with cough productive of greenish sputum.  She reports having fevers and chills along with dyspnea on exertion.  She denies having any chest pain.  She also reports having wheezing off  and on.  She denies having any nausea, vomiting or diarrhea.   PAST MEDICAL HISTORY:  1. History of congestive heart failure in the past.  2. A reported hospitalization for pneumonia and CHF in 2007.  3. Also, a history of hypertension and depression.   MEDICATIONS AT THIS TIME:  1. Wellbutrin 300 mg one p.o. daily.  2. Prozac 20 mg one p.o. daily.  3. Lasix 40 mg one p.o. daily.   ALLERGIES:  No known drug allergies.   SOCIAL HISTORY:  The patient is a smoker.  She smokes 1/2 pack per day  for 10 years and she reports also drinking alcohol 1-2 beers daily and  occasionally mixed drinks.   FAMILY HISTORY:  Noncontributory.   REVIEW OF SYSTEMS:  Pertinents are mentioned above.   PAST SURGICAL HISTORY:  None.   PHYSICAL EXAMINATION FINDINGS:  This is a 50 year old overweight female  in discomfort but no acute distress.  VITAL SIGNS:  Temperature 99.5, blood pressure 148/89, heart rate 91,  respirations 24, O2 saturations 93% on room air.  HEENT:  Normocephalic, atraumatic.  Pupils equally round, reactive to  light.  Extraocular movements are intact, funduscopic benign.  There is  no scleral  icterus.  Oropharynx is clear.  NECK:  Supple, full range of motion.  No thyromegaly, adenopathy,  jugular venous distention.  CARDIOVASCULAR:  Regular rate, rhythm.  No murmurs, gallops, rubs.  LUNGS:  Clear to auscultation but mildly decreased.  There are no rales,  rhonchi or wheezes.  ABDOMEN:  Positive bowel sounds, soft, nontender, nondistended.  EXTREMITIES:  Without cyanosis, clubbing or edema.  NEUROLOGIC EXAMINATION:  Nonfocal.   LABORATORY STUDIES:  White blood cell count 10.7, hemoglobin 9.7,  hematocrit 30.2, platelets 236, neutrophils 93%, lymphocytes 3%.  Sodium  139, potassium 3.1, chloride 99, bicarb 31, BUN 6, creatinine 0.84 and  glucose 103.  Beta natriuretic peptide 934.  Chest x-ray reveals no  pneumonia; however, stable cardiomegaly with vascular congestion  present.  Cardiac enzymes with a CK of 220, CK-MB of 1.6, relative index  0.7 and troponin 0.01.  EKG performed revealing a normal sinus rhythm  with a ventricular rate of 96 with nonspecific ST-segment changes.   ASSESSMENT:  A 50 year old female being admitted with:  1. Shortness of breath.  2. Influenza versus early pneumonia.  3. Acute congestive heart failure/diastolic congestive heart failure      syndrome.  4. Anemia.  5. Hypertension.  6. Hypokalemia.   PLAN:  The patient will be admitted, placed on IV antibiotic therapy of  Avelox, nebulizer treatments and an IV steroid taper.  She will also be  placed on telemetry for cardiac monitoring.  Cardiac enzymes will  continue to be performed.  IV diuretic therapy will also be administered  along with potassium replacement therapy.  She will continue on her  regular medications and DVT and GI prophylaxis will be ordered.  Further  workup will ensue pending results of her studies and her clinical  course.      Della Goo, M.D.  Electronically Signed     HJ/MEDQ  D:  04/02/2008  T:  04/02/2008  Job:  [id number]

## 2010-11-19 NOTE — Discharge Summary (Signed)
NAMENORMALEE, SISTARE NO.:  [id number]   MEDICAL RECORD NO.:  [id number]          PATIENT TYPE:  INP   LOCATION:  1428                         FACILITY:  [facility]   PHYSICIAN:  Isidor Holts, M.D.  DATE OF BIRTH:  1961-01-30   DATE OF ADMISSION:  04/01/2008  DATE OF DISCHARGE:  04/08/2008                               DISCHARGE SUMMARY   DISCHARGE DIAGNOSES:  1. Acute  bronchitis.  2. Angioedema of face and tongue, etiology unclear.  3. Cardiomyopathy/moderate to severe mitral regurgitation; ejection      fraction 60-65% by 2D echocardiogram April 03, 2008.  4. Hypertension.  5. Smoking history.  6. Iron deficiency anemia.  7. Subclinical hyperthyroidism.   DISCHARGE MEDICATIONS:  1. Prozac 20 mg daily.  2. Wellbutrin XL 300 mg p.o. daily.  3. Lasix 40 mg p.o. b.i.d. (was on 40 mg p.o. daily).  4. Norvasc 10 mg p.o. daily.  5. NuIron 150 mg p.o. b.i.d.  6. Albuterol inhaler two puffs p.r.n. q.4-6 h for short term use.   PROCEDURES:  1. Chest x-ray dated April 01, 2008.  This showed stable      cardiomegaly and vascular congestion.  No evidence of pneumonia.  2. Chest CT angiogram dated April 07, 2008.  This showed no evidence      of acute pulmonary embolism, though with mild mediastinal and hilar      lymphadenopathy, bilateral ground glass opacities with an upper      lobe predominance, marked pericardial thickening.  3. A 2D echocardiogram dated April 03, 2008, that showed overall      normal left ventricle systolic function, EF 60-65%.  There were no      left ventricular regional wall motion abnormalities.  Left      ventricle wall thickness was mildly increased.  The upper      parameters were consistent with elevated mean left atrial filling      pressure.  There was moderate aortic valvular regurgitation or      right severe mitral valvular regurgitation.  Left atrial sides were      within the upper limits of normal.  Estimated peak  right      ventricular systolic pressure was mild to moderately increased.      Estimated peak right ventricular systolic pressure was in the range      of 35 mmHg to 45 mmHg.  There was moderate tricuspid valvular      regurgitation.   CONSULTATIONS:  Armanda Magic, M.D., cardiologist.   ADMISSION HISTORY:  As in the H&P notes of April 01, 2008, dictated  by Dr. Della Goo.  However, in brief, this is a 50 year old  female, with known history of congestive heart failure, status post  previous hospitalization for pneumonia/CHF 2007, hypertension,  depression, presenting with worsening shortness of breath for 3 days,  along with cough productive greenish phlegm, fevers and chills.  She was  admitted for further evaluation, investigation and management.   CLINICAL COURSE:  1. Acute bronchitis.  For  details of presentation, refer to admission      history.  Chest  x-rays  showed no evidence of pneumonic      consolidation.  The patient was managed with bronchodilator      nebulizers, Avelox, oxygen supplementation and steroids.  By      April 07, 2008, wheezes had disappeared.  She was no longer short      of breath, sputum had cleared up, and she felt clinically much      improved.  She had no episodes of pyrexia during the course of her      hospitalization.   1. Congestive cardiomyopathy/moderate to severe mitral regurgitation.      The patient does have a known history of congestive heart failure      in 2007.  She presents with shortness of breath, wheeze and      productive cough.  She was managed as above, for acute bronchitis.      However, it was felt that her cardiologic status warranted further      evaluation. She underwent 2D echocardiogram on April 03, 2008.      For details of findings, refer to procedure list above.  Also, her      BNP was found to be elevated 416.  She was managed with diuretics,      with satisfactory clinical response.  Cardiology  consultation was      kindly provided by Dr. Armanda Magic, who did a D-dimer, which was      found to be elevated at 2.40.  However, followup chest CT angiogram      of April 07, 2008, showed no evidence of pulmonary embolism.  Dr.      Mayford Knife has recommended continued diuretic use and plans to have      patient follow up in her office, on discharge.  As of April 08, 2008, BNP was 136.  The patient's Lasix has been increased to 40 mg      p.o. b.i.d.   1. Hypertension.  This was managed with calcium channel blocker.   1. Angioedema.  The patient was in the night of April 05, 2008,      noted to have facial and tongue swelling, raising suspicion of      angioedema.  She was managed with parenteral steroids, H2RA and      antihistaminics, with resolution.  Review of her medication list,      showed no obvious culprit medications.  However, antibiotics were      discontinued on that date.  There were no recurrences, and by      April 07, 2008, angioedema had completely resolved.  Because of      this, one is reluctant to start the patient on ACE inhibitor.   1. Subclinical hyperthyroidism.  The patient was noted to have a TSH      of 0.162.  T4 was normal at 1.20.  T3 was normal at 2.9.  This      constellation of biochemical findings, appears consistent with      subclinical hyperthyroidism.  No treatment is indicated for now.      However, follow up of thyroid function tests periodically, by      primary MD is indicated and recommended.   1. Iron deficiency anemia.  The patient was noted to have an anemia of      9.1 with a hematocrit of 20.6 on April 06, 2008. Laboratory      studies  showed iron which was low at 20.  TIBC 324, percent      desaturation 6.  B-12 was 438.  Folate was 9.6.  Ferritin was 334.      The patient has been commenced on iron supplements, accordingly.   1. History of depression.  The patient's mood remained stable      throughout the course of  her hospitalization.   DISPOSITION:  The patient was, on April 08, 2008, considered clinically  stable for discharge.  She was therefore discharged accordingly.   DISCHARGE INSTRUCTIONS:  1. Diet:  Heart healthy.  2. Activity:  As tolerated.  Recommended to increase activity slowly.   FOLLOWUP:  The patient is recommended to follow up with her primary MD,  Dr. Fannie Knee Drinkard at [facility] within 1-2 weeks of discharge.  She has  been instructed to call for an appointment.  In addition, she has  scheduled an appointment for April 11, 2008, with Dr. Armanda Magic,  cardiologist, phone number [phone].      Isidor Holts, M.D.  Electronically Signed     CO/MEDQ  D:  04/08/2008  T:  04/08/2008  Job:  [id number]   cc:   Armanda Magic, M.D.  Fax: [phone]   Brett Canales Dr. Hinda Kehr  Medical Dept. [facility]

## 2010-11-22 NOTE — Discharge Summary (Signed)
[facility]  Patient:    Stephanie Joseph, Stephanie Joseph Visit Number: [id number] MRN: [id number]          Service Type: PSY Location: 50 [id number] Attending Physician:  Jeanice Lim Dictated by:   Jeanice Lim, M.D. Admit Date:  04/11/2001 Discharge Date: 04/14/2001                             Discharge Summary  IDENTIFYING DATA:  This is a 50 year old single African-American female voluntarily admitted for depression and suicidal ideation.  MEDICATIONS:  Prozac, hydrochlorothiazide and atenolol prior to admission.  ALLERGIES:  No known drug allergies.  PHYSICAL EXAMINATION:  Essentially within normal limits.  Vital signs stable. Afebrile.  Neurologically nonfocal.  LABORATORY DATA:  Routine screening labs were essentially within normal limits including a CBC with differential, a comprehensive metabolic panel and thyroid profile, which showed a slightly elevated T3 uptake at 43.  MENTAL STATUS EXAMINATION:  The patient was alert and oriented, pleasant with little eye contact.  Speech was within normal limits.  Mood depressed.  Affect restricted.  Thought process goal directed.  Thought content negative for psychotic symptoms.  No suicidal or homicidal ideation.  Cognition grossly intact.  Memory and judgment fair with history of poor impulse control.  ADMISSION DIAGNOSES: Axis I:    1. Depression not otherwise specified.            2. Alcohol abuse. Axis II:   None. Axis III:  Hypertension. Axis IV:   Mild (Psychosocial problems related to primary support group). Axis V:    40/70.  HOSPITAL COURSE:  The patient was admitted with routine p.r.n. medications, ordered individual, group and milieu therapy, monitored and medications were resumed.  Prozac 20 mg q.a.m.  Prozac was optimized and Wellbutrin started and titrated.  The patient reported positive improvement for his mood, motivation to be compliant with substance abuse treatment.  CONDITION  ON DISCHARGE:  Improved mood.  Mostly euthymic.  Affect brighter. Thought processes goal directed.  No psychotic symptoms.  No dangerous ideation.  Judgment and insight had improved.  Motivation to be compliant with substance abuse treatment and recommendations.  DISPOSITION:  To follow up with [facility] on April 20, 2001 at 1 p.m. and Lenore Cordia on May 11, 2001 at 2 p.m.  He was given crisis numbers.  DISCHARGE MEDICATIONS: 1. Hydrochlorothiazide. 2. Tenormin. 3. Prozac 40 mg q.a.m. 4. Wellbutrin SR 100 mg q.a.m. and 2 p.m. 5. Seroquel 25 mg q.h.s.  DISCHARGE DIAGNOSES: Axis I:    1. Depression not otherwise specified.            2. Alcohol abuse. Axis II:   None. Axis III:  Hypertension. Axis IV:   Mild (Psychosocial problems related to primary support group). Axis V:    Global Assessment of Functioning on discharge 55-60. Dictated by:   Jeanice Lim, M.D. Attending Physician:  Jeanice Lim DD:  05/26/01 TD:  05/30/01 Job: [id number]

## 2010-11-22 NOTE — H&P (Signed)
[facility]  Patient:    Stephanie Joseph                     MRN: [id number] Adm. Date:  [id number] Attending:  Silvio Pate Dictator:   Nancee Liter, M.D. CC:         Barbaraann Share, M.D. [facility]             [facility], Nadyne Coombes, R.N.                         History and Physical  ADMISSION DIAGNOSES:  1. Bilateral lower lobe pneumonia.  2. Systemic inflammatory response system: fever, tachycardia, tachypnea.  3. Alcohol abuse.  4. Tobacco abuse.  5. Hyponatremia.  6. Hypokalemia.  7. Hypocalcemia.  8. Neutropenia.  9. Anemia. 10. Ileus.  HISTORY OF PRESENT ILLNESS:  The patient is a 50 year old black female who presented to [facility] with complaint of abdominal pain on July 15, 1999. The patient was examined and felt to be stable.  Labs and chest x-ray were ordered.  The patients chest x-ray was read on July 16, 1999, and found to have bilateral lower lobe infiltrates with a right lower lobe consolidation.  The patient was taken by EMS to the [facility] Emergency Department for evaluation.  The patient was found to be extremely hypoxic en route with pulse oximetry of 72%.  The patient complained of abdominal pain, diarrhea, shortness of breath, dyspnea on exertion, and nonproductive cough and nausea over the last four days.  The patient said the diarrhea had started on July 15, 1999.  The patient denies headache, photophobia, nuchal rigidity, confusion, lightheadedness, visual changes, sore throat, rashes, sick contacts, or history of TB.  PAST MEDICAL HISTORY:  1. History of pneumonia in 1997.  2. Tobacco use.  3. Alcohol abuse.  4. 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  MEDICATIONS:  Norplant.  ALLERGIES:  No known drug allergies.  SOCIAL HISTORY: The patient lives with her daughter is [place].  The daughter is disabled, and the patient stays at home with her.  The patient is single.   The patient drank a fifth a day until the last five or six days when she increased er use.  The patient smokes one pack per day and has for 10 to 20 years.  The patient denies every using needles.  The patient reports her last intercourse occurred seven years ago.  FAMILY HISTORY:  No known family illnesses.  Her parents are alive and well.  REVIEW OF SYSTEMS:  The patient denies chest pain, bright red blood per rectum,  melena.  Positive diarrhea, positive nausea.  No lower extremity swelling.  No rashes.  No dysuria or hematuria.  PHYSICAL EXAMINATION:  VITAL SIGNS:  Temperature 101 to 102, pulse 133, blood pressure 149/82, respirations 40 to 42.  Saturation 90 to 96% on 100% face mask.  GENERAL:  She is a well-nourished, well-developed black female in mild respiratory distress.  HEENT:  Pupils are equal, round and reactive to light.  Sclerae nonicteric. Conjunctivae not injected.  Oropharynx clear.  NECK:  No masses or lymphadenopathy.  HEART:  Tachycardic with regular rhythm, no murmurs noted.  LUNGS:  Bilateral rhonchi in upper lobes.  Decreased breath sounds in bases with egophony on the right and rales on the left.  Dullness to percussion as well.  ABDOMEN:  Mildly distended, soft, mildly tender.  No  rebound, no masses.  Bowel  sounds are distant but present.  EXTREMITIES:  No edema, warm.  SKIN:  No rashes.  LABORATORY DATA:  Sodium 126, potassium 2.9, chloride 92, bicarb 23, BUN 25, creatinine 0.9, glucose 124, calcium 7.2.  White blood cell count 2.3 (down from 8.6 yesterday), hemoglobin 9.7 (down from 10.7 yesterday), platelet count 172 (down from 222 yesterday).  ABG 7.46/38/72 on 100% face mask.  RADIOLOGY:  Chest x-ray shows right lower lobe consolidation, left lower lobe infiltrate.  Upper lobes are clear.  KUB: Ileus pattern, small-bowel.  EKG: Sinus tachycardia with rate of 134.  Normal axis.  No hypertrophy.   Normal  EKG.  IMPRESSION/PLAN: 1. Bilateral pneumonia.  Suspect Legionella or Streptococcus given the    gastrointestinal symptoms, consolidation, and fallen white blood cell count.    I will check Legionella urinary antigen, sputum for Gram stain and culture.    I will start Zosyn and azithromycin to cover gram negatives as well as gram    positives, Streptococcus.  The patient received one dose of Levaquin in the    emergency department.  The patient will be monitored closely as she will    probably need to be intubated if she starts to tire.  We will be checking an    HIV with the consideration that the patient may have PCP.  The acute drop in    white blood cell count is probably related to the pneumonia and not a    consequence of HIV. 2. Anemia.  Hemoglobin has decreased by 1 g over one day.  The RDW is elevated.    I will check iron, TIBC, and percent saturation as well as guaiac stools. This    may represent an effect of the possible sepsis syndrome.  I will check a    reticulocyte count.  The patient may have a component of bone marrow suppression    secondary to alcohol abuse. 3. Abdominal pain/ileus.  The patient has had a lipase and amylase drawn.  The    amylase was 55.  I will start Pepcid.  The patient has had diarrhea.  We will    make her n.p.o. except for ice chips and medications. 4. Neutropenia.  Drop in white blood cell count from 8.6 to 2.3 over 24 hours.    I have called the lab to have the pathology slides reviewed, and they report    that the initial reading is accurate.  This represents margination,    sequestration, and/or bone marrow suppression.  We will follow this closely.    The patient will receive folic acid. 5. 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  I will check a urine    sodium, urine osmolarity, and serum osmolarity. 6. Hypocalcemia/hypoalbuminemia.  I  will replace the calcium and check LFTs. 7. Hypokalemia most likely secondary to gastrointestinal losses.  I will replace    with IV Kay-Ciel.  8. Alcohol abuse.  The patient will be watched carefully for DTs.  We will give  the patient thiamine and folate IV. DD:  07/16/99 TD:  07/16/99 Job: [id number]

## 2010-11-22 NOTE — Op Note (Signed)
NAME:  Stephanie Joseph, GEESLIN                       ACCOUNT NO.:  [id number]   MEDICAL RECORD NO.:  [id number]                   PATIENT TYPE:  AMB   LOCATION:  DSC                                  FACILITY:  [facility]   PHYSICIAN:  Feliberto Gottron. Turner Daniels, M.D.                DATE OF BIRTH:  06-19-1961   DATE OF PROCEDURE:  05/11/2002  DATE OF DISCHARGE:  05/11/2002                                 OPERATIVE REPORT   PREOPERATIVE DIAGNOSES:  1. Left knee medial meniscal tear.  2. Anterior cruciate ligament deficient knee.  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   POSTOPERATIVE DIAGNOSES:  1. Left knee medial meniscal tear.  2. Anterior cruciate ligament deficient knee.  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   PROCEDURE:  Left knee arthroscopic partial medial meniscectomy, with  debridement of chondromalacia.   ANESTHESIA:  General endotracheal.   SURGEON:  Feliberto Gottron. Turner Daniels, M.D.   ASSISTANT:  Skip Mayer, P.A.-C.   ESTIMATED BLOOD LOSS:  Minimal.   FLUIDS REPLACED:  800 cc of crystalloid.   DRAINS:  None.   TOURNIQUET TIME:  None.   COMPLICATIONS:  None.   INDICATIONS FOR PROCEDURE:  This 50 year old woman with a left knee injury  consistent with a medial meniscal tear and an ACL deficiency.  Because of  her age and activity style, she is taken for an arthroscopic partial medial  meniscectomy regarding the treatment of the ACL.  We will debride the  meniscus.  The ACL will be treated with continued physical therapy, although  she has had this prior to the surgery.  Apparently she slipped and fell and  injured her medial meniscus as well.  She continues to have instability, ACL  deficiency, which will be corrected by a reconstruction, but right now she  does not have the resources to have physical therapy after an ACL  Reconstruction; therefore it would not be a wise choice at this point.   DESCRIPTION OF PROCEDURE:  The patient was identified by arm band and  taken  to the operating room at [facility]. [facility].  Appropriate anesthetic monitors were attached.  General endotracheal  anesthesia was induced with the patient in the supine position.  Lateral  post applied to the table.  The left lower extremity was prepped and draped  in the usual sterile fashion from the ankle to the mid-thigh.  The standard  inferomedial and inferolateral parapatellar portals were then made with a  #11 blade, allowing introduction of the arthroscope through the  inferolateral portal, and the outflow through the inferomedial portal.  A  diagnostic arthroscopy did reveal grade 3 chondromalacia of the medial  femoral condyle and the medial meniscal tear which were both debrided with a  4.2 great white sucker shaver and straight _____ basket cutters.  Moving  into the notch, the ACL was deficient.  It was debrided back  to its origin  and its insertion, removing the stumps.  On the lateral side the articular  and meniscal cartilages were in good condition.  Small bits of articular  cartilage were noted in the joint fluid and taken through the outflow.  The  gutters were cleared.  The scope was taken medial and lateral to the PCL,  clearing the posterior compartments as well.  At this point the knee was  washed out with normal saline solution.  The arthroscopic instruments were  removed.  A dressing of Xeroform, 4 x 4 dressing, sponges, Webril and an Ace  wrap were applied.  The patient was then awakened and taken to the recovery room without  difficulty.                                                 Feliberto Gottron. Turner Daniels, M.D.    Ovid Curd  D:  06/06/2002  T:  06/06/2002  Job:  [id number]

## 2010-11-22 NOTE — H&P (Signed)
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 Orsini, N.P. Admit Date:  04/11/2001                     Psychiatric Admission Assessment  DATE OF ADMISSION:  April 11, 2001  PATIENT IDENTIFICATION:  This is a 50 year old single African-American female voluntarily admitted on April 11, 2001, for depression and suicidal ideation.  HISTORY OF PRESENT ILLNESS:  The patient presents with a history of depression for years with depression escalating with the patient having suicidal thoughts on Saturday with plans to overdose.  She broke up with her fiancee.  He had left after she began drinking again.  Stephanie Joseph is remaining sober and trying to do well for himself.  She became depressed, having suicidal thoughts.  The patient has a history of alcohol abuse.  Her longest history of sobriety has been seven months.  She relapsed about a month ago with her last drink in August.  She reports no withdrawal symptoms or blackouts.  She is very motivated to stop drinking for herself, child, and hoping her fiancee will give her another chance.  She states she knows what to do to keep herself sober.  Sleeping and appetite have been satisfactory.  She denies any psychotic symptoms.  She denies any current suicidal or homicidal ideation and promises safety.  PAST PSYCHIATRIC HISTORY:  Has a history of depression since the age of 56. She believes she has had an admission before about two years.  She sees Dr. Annetta Maw at [facility] with her first visit about three weeks ago.  SUBSTANCE ABUSE HISTORY:  Smokes for years.  Has been drinking since the age of 45.  She drinks approximately about a half a bottle of wine.  Last drink was on March 03, 2001.  The patient had a history of crack cocaine  use, has been sober from cocaine since 1996.  Reports no substance abuse currently.  PAST MEDICAL HISTORY:  Primary care Chrisette Man: Dr. Lupita Shutter at [facility]. Medical problems: Hypertension.  MEDICATIONS: 1. Prozac 20 mg q.d., been on this for three and a half weeks, reports it is    not effective. 2. Hydrochlorothiazide 12.5 mg q.a.m. 3. Atenolol 100 mg q.d.  DRUG ALLERGIES:  No allergies.  REVIEW OF SYSTEMS:  The patient denies any fever or chills or changes in appetite.  No blurred or double vision.  No hearing loss.  No sinus pain.  No chest pain or palpitations.  No cough or shortness of breath; the patient smokes.  No heartburn, change in habits, or blood in the stool.  GU: No dysuria, frequency, or hematuria.  No stiffness, swelling, or joint pain.  No itching, wounds, or rash.  No numbness, tingling, or seizures.  Long history of depression with suicidal thoughts, no history of suicide attempts.  No thyroid or diabetic problems, no enlarged or tender nodes, no anemias, no environmental allergies.  PHYSICAL EXAMINATION:  VITAL SIGNS:  Temperature 98, pulse 73, respirations 18, blood pressure 114/77.  The patient is 5 feet 7 inches, 204 pounds.  GENERAL:  The patient is a 50 year old African-American female in no acute distress, well-developed, appears her stated age, obese.  The patient is in hospital wear, alert and cooperative.  HEENT:  Head  is normocephalic.  She can raise her eyebrows.  Hair is of equal distribution.  EOMs are intact bilaterally.  External ear canals are patent. Hearing is appropriate to conversation.  Mucosa is moist.  Fair dentition. Has some missing teeth, no lesions were seen.  Tongue protrudes to midline without tremor.  She can clench her teeth and puff out her cheeks.  NECK:  Supple, no JVD, negative lymphadenopathy.  Thyroid is nonpalpable, nontender.  Trachea is midline.  CHEST:  Clear to auscultation, no adventitious sounds, no  cough.  HEART:  Regular rate and rhythm, no edema was noted.  BREAST:  Deferred.  ABDOMEN:  Soft, obese, nontender abdomen, no CVA tenderness.  MUSCULOSKELETAL:  No joint swelling or deformity, good range of motion. Muscle strength and tone is equal bilaterally, no signs of injury.  SKIN:  Warm and dry with good turgor.  Nail beds are pink with good capillary refill.  NEUROLOGIC:  Oriented x 3.  Cranial nerves are grossly intact.  Good grip strength bilaterally.  No involuntary movements.  Gait is normal.  Cerebellar function is intact with heel-to-shin and normal alternating movements.  Health maintenance issues were addressed.  SOCIAL HISTORY:  A 50 year old single African-American female, has a 34 year old daughter.  She lives with her daughter.  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  She completed the 12th grade.  She has no legal charges.  FAMILY HISTORY:  Mother with depression.  MENTAL STATUS EXAMINATION:  She is an alert, middle-aged African-American female dressed in hospital gown.  She is cooperative, pleasant, with little eye contact.  Speech is normal and relevant.  Mood is depressed.  Affect is depressed but overall euthymic.  Thought processes are coherent.  There is no evidence of psychosis, no auditory or visual hallucinations, no suicidal or homicidal ideations.  Cognitive: Intact.  Memory is good.  Judgment is fair. Insight is good.  Poor impulse control.  ADMISSION DIAGNOSES: Axis I:    1. Depression, not otherwise specified.            2. Alcohol abuse. Axis II:   Deferred. Axis III:  Hypertension. Axis IV:   Psychosocial problems and problems with primary support group. Axis V:    Current is 40, past year is 85.  INITIAL PLAN OF CARE:  Plan is a voluntary admission to [facility] for depression, suicidal ideation, and alcohol abuse.  Contract for safety.  Check every 15 minutes.  Will resume her routine medications.  Will obtain labs.  Will increase Prozac to  reduce depressive symptoms.  Attend  groups.  Our goal is to return the patient to her prior living arrangement, to stabilize her mood and thinking so the patient can be safe, for the patient to remain sober, attend AA and [facility] after discharge.  ESTIMATED LENGTH OF STAY:  Three to four days. Dictated by:   Candi Leash. Orsini, N.P. Attending Physician:  Denny Peon DD:  04/12/01 TD:  04/12/01 Job: [id number]

## 2010-11-22 NOTE — Discharge Summary (Signed)
[facility]  Patient:    Stephanie Joseph                     MRN: [id number] Adm. Date:  [id number] Disc. Date: [id number] Attending:  Farley Ly Dictator:   Talmage Coin, M.D.                           Discharge Summary  DATE OF BIRTH:  [birth year]  ADMISSION DIAGNOSES:  1. Bilateral lower lobe pneumonia.  2. Systemic inflammatory response syndrome with fever, tachycardia, and     tachypnea.  3. Alcohol abuse.  4. Tobacco use.  5. Hyponatremia.  6. Hypokalemia.  7. Hypocalcemia.  8. Neutropenia.  9. Anemia. 10. Ileus.  DISCHARGE DIAGNOSES:  1. Bilateral lower lobe pneumonia.  2. Systemic inflammatory response syndrome with fever, tachycardia, and     tachypnea.  3. Alcohol abuse.  4. Tobacco use.  5. Hyponatremia.  6. Hypokalemia.  7. Hypocalcemia.  8. Neutropenia.  9. Anemia. 10. Ileus. 11. Brief psychosis during hospitalization. 12. Adult respiratory distress syndrome.  DISCHARGE MEDICATIONS:  Ferrous sulfate 325 mg 1 p.o. t.i.d. x 2 months. Prevacid  and promethazine as previously prescribed by the [facility], dosage unknown at the time of her hospitalization.  She reports that she has the medications at home and will follow up with her [facility] physicians regarding this.  Multivitamin 1 p.o. q..d  ACTIVITY:  As tolerated.  DIET:  As tolerated.  FOLLOWUP:  The patient is to return to [facility] for followup appointment at 2:45 p.m. on Friday, August 09, 1999, with Fannie Knee Drinkard.  She will need a followup chest x-ray to evaluate infiltrate/pleural effusion and will need to recheck a hemoglobin level.  ADMISSION HISTORY AND PHYSICAL:  The patient is a 50 year old black female who presented to [facility] with complaint of abdominal pain on July 15, 1999.  She was examined and felt to be stable.  Labs and chest x-ray were ordered.  Chest x-ray revealed bilateral lower lobe  infiltrate with right lower lobe consolidation.  She was taken by ambulance to [facility] Emergency Department.  She was found to be extremely hypoxic with pulse oximetry at 72% en route to the hospital.  She complained of abdominal pain, diarrhea, shortness of breath, dyspnea on exertion, a nonproductive cough, and nausea over the last four days.  She had diarrhea which started on July 15, 1999, as well.  She denied headache, photophobia, nuchal rigidity, confusion, lightheadedness, visual changes, sore throat, rashes, sick contacts, or history of TB.  PAST MEDICAL HISTORY:  Included pneumonia, tobacco use, alcohol abuse, and history of crack cocaine use.  MEDICATIONS:  Included Norplant.  ALLERGIES:  None.  PHYSICAL EXAMINATION:  VITAL SIGNS:  Temperature 101 to 102, pulse 133, blood pressure 149/82, respirations 40 to 42, saturation 90 to 96% on 100% oxygen per nonrebreather face mask.  GENERAL:  Well-developed, well-nourished, mild respiratory distress.  HEENT:  Pupils are equal, round and reactive.  Sclerae nonicteric.  No conjunctival injection.  Mouth and throat clear.  NECK:  No masses, no adenopathy.  HEART:  Tachycardic, regular rhythm, no murmurs.  LUNGS:  Bilateral rhonchi, decreased breath sounds in bases with egophony on the right and rales on the left, dullness to percussion as well.  ABDOMEN:  Soft, mildly tender, no masses.  EXTREMITIES:  No edema, no warmth.  SKIN:  No  rashes.  LABORATORY DATA:  Sodium 126, potassium 2.9, chloride 92, bicarb 23, BUN 25, creatinine 0.9, glucose 124, calcium 7.2.  White blood cells 2.3, down from 8.6 the day prior to admission.  Hemoglobin 9.7, down from 10.7 yesterday. Platelets 172, down from 222 yesterday.  ABG 7.46/38/72 on 100% face mask.  Chest x-ray showed right lower lobe consolidation, left lower lobe infiltrate.  KUB showed ileus pattern.  EKG showed sinus tachycardia with rate of 134, otherwise  normal.  HOSPITAL COURSE:  #1 - BILATERAL PNEUMONIA:  The patient was in acute respiratory failure.  She was intubated on July 18, 1999, by anesthesiology who were called for an awake intubation.  Rapid sequence intubation was performed successfully.  She was started on azithromycin and Zosyn intravenously as well as nebulizer therapy and appropriate sedation for mechanical ventilation.  Over the course of her stay, she developed acute respiratory distress syndrome.  She had difficulty with oxygenation despite being on ventilation.  However, this resolved over the course of her stay.  By the time of discharge, she was oxygenating well with oxygen saturation 100% on 2 liters as well as oxygen saturations of 92% at rest on room air, and greater than 90% with exertion. She had only a transient drop down to 85% room air on exertion, and that was quickly relieved by rest.  Her pneumonia was confirmed to be Streptococcal or pneumococcal pneumonia.  Sepsis resolved, and she was doing reasonably well in regards to blood pressure and systemic symptoms.  #2 - ANEMIA:  This remained rather stable.  She was confirmed by laboratory studies to have iron-deficiency anemia and was started on iron therapy.  She did not have any signs or symptoms of gastrointestinal bleeding, and this was thought to be due to other causes.  #3 - ILEUS:  Her ileus resolved after the initial presentation.  We were able to titrate up tube feeds.  She was on a regular diet by the time of discharge and having adequate bowel movements and bowel function.  LABORATORY DATA ON DISCHARGE:  Her labs at the time of discharge include hemoglobin 10.6, white blood cells 10.3, platelets 646.  Potassium 4.4, BUN 9, creatinine 0.5.  Even to the time of discharge, her x-ray reveals pleural effusion and what appears to represent fibrotic changes in the lung.   Will need to follow up on this as an outpatient.  Otherwise, her  hospital course was relatively unremarkable except for a  psychosis that resolved once she was taken off some of her medications.  This was thought to be due to ICU psychosis or drug-related psychosis.  By the time of discharge, she was quite competent and lucid with clear mentation.  If you have any questions or concerns regarding her care and her medical problems, please feel free to contact me. DD:  09/12/99 TD:  09/13/99 Job: [id number]

## 2010-12-12 ENCOUNTER — Other Ambulatory Visit: Payer: Self-pay | Admitting: Internal Medicine

## 2010-12-12 NOTE — Telephone Encounter (Signed)
Is this our patient?

## 2010-12-16 ENCOUNTER — Other Ambulatory Visit: Payer: Self-pay | Admitting: Internal Medicine

## 2010-12-16 NOTE — Telephone Encounter (Signed)
Call to pt.  Pt goes to [facility].  Call again to [facility] to have refill request sent to [facility].

## 2010-12-16 NOTE — Telephone Encounter (Signed)
Call to [facility] spoke to [name]- informed her that Stephanie Joseph is not a patient of the [facility] at [facility].

## 2010-12-16 NOTE — Telephone Encounter (Signed)
Call to pharmacy and refax that pt is not connected with the Clinics.

## 2011-04-07 LAB — DIFFERENTIAL
Basophils Absolute: 0
Basophils Absolute: 0
Basophils Relative: 0
Basophils Relative: 0
Eosinophils Absolute: 0.1
Eosinophils Absolute: 0.1
Eosinophils Relative: 1
Eosinophils Relative: 1
Lymphocytes Relative: 3 — ABNORMAL LOW
Lymphocytes Relative: 18
Lymphs Abs: 0.3 — ABNORMAL LOW
Lymphs Abs: 1.7
Monocytes Absolute: 0.3
Monocytes Absolute: 0.2
Monocytes Relative: 3
Monocytes Relative: 3
Neutro Abs: 10 — ABNORMAL HIGH
Neutro Abs: 7.2
Neutrophils Relative %: 93 — ABNORMAL HIGH
Neutrophils Relative %: 78 — ABNORMAL HIGH

## 2011-04-07 LAB — GLUCOSE, CAPILLARY
Glucose-Capillary: 116 — ABNORMAL HIGH
Glucose-Capillary: 134 — ABNORMAL HIGH
Glucose-Capillary: 136 — ABNORMAL HIGH
Glucose-Capillary: 153 — ABNORMAL HIGH
Glucose-Capillary: 163 — ABNORMAL HIGH
Glucose-Capillary: 168 — ABNORMAL HIGH
Glucose-Capillary: 182 — ABNORMAL HIGH
Glucose-Capillary: 214 — ABNORMAL HIGH

## 2011-04-07 LAB — CBC
HCT: 30.2 — ABNORMAL LOW
HCT: 28.6 — ABNORMAL LOW
Hemoglobin: 9.7 — ABNORMAL LOW
Hemoglobin: 9.1 — ABNORMAL LOW
MCHC: 32
MCHC: 31.8
MCV: 92.4
MCV: 92.6
Platelets: 236
Platelets: 250
RBC: 3.26 — ABNORMAL LOW
RBC: 3.08 — ABNORMAL LOW
RDW: 17.6 — ABNORMAL HIGH
RDW: 17.6 — ABNORMAL HIGH
WBC: 10.7 — ABNORMAL HIGH
WBC: 9.2

## 2011-04-07 LAB — COMPREHENSIVE METABOLIC PANEL
ALT: 21
AST: 31
Albumin: 3.3 — ABNORMAL LOW
Alkaline Phosphatase: 101
BUN: 6
CO2: 31
Calcium: 8.5
Chloride: 99
Creatinine, Ser: 0.84
GFR calc Af Amer: >60
GFR calc non Af Amer: >60
Glucose, Bld: 103 — ABNORMAL HIGH
Potassium: 3.1 — ABNORMAL LOW
Sodium: 139
Total Bilirubin: 0.8
Total Protein: 6.6

## 2011-04-07 LAB — BASIC METABOLIC PANEL
BUN: 10
BUN: 10
BUN: 9
CO2: 28
CO2: 29
CO2: 31
Calcium: 8.6
Calcium: 8.6
Calcium: 8.7
Chloride: 101
Chloride: 104
Chloride: 105
Creatinine, Ser: 0.7
Creatinine, Ser: 0.74
Creatinine, Ser: 0.83
GFR calc Af Amer: >60
GFR calc Af Amer: >60
GFR calc Af Amer: >60
GFR calc non Af Amer: >60
GFR calc non Af Amer: >60
GFR calc non Af Amer: >60
Glucose, Bld: 133 — ABNORMAL HIGH
Glucose, Bld: 165 — ABNORMAL HIGH
Glucose, Bld: 95
Potassium: 3.6
Potassium: 4.1
Potassium: 4.3
Sodium: 138
Sodium: 138
Sodium: 138

## 2011-04-07 LAB — MAGNESIUM: Magnesium: 2

## 2011-04-07 LAB — CARDIAC PANEL(CRET KIN+CKTOT+MB+TROPI)
CK, MB: 2.1
CK, MB: 2.5
Relative Index: 1
Relative Index: 1.2
Total CK: 207 — ABNORMAL HIGH
Total CK: 218 — ABNORMAL HIGH
Troponin I: 0.01
Troponin I: 0.02

## 2011-04-07 LAB — CULTURE, BLOOD (ROUTINE X 2)
Culture: NO GROWTH
Culture: NO GROWTH

## 2011-04-07 LAB — B-NATRIURETIC PEPTIDE (CONVERTED LAB)
Pro B Natriuretic peptide (BNP): 416 — ABNORMAL HIGH
Pro B Natriuretic peptide (BNP): 934 — ABNORMAL HIGH
Pro B Natriuretic peptide (BNP): 136 — ABNORMAL HIGH
Pro B Natriuretic peptide (BNP): 406 — ABNORMAL HIGH
Pro B Natriuretic peptide (BNP): 608 — ABNORMAL HIGH

## 2011-04-07 LAB — CK TOTAL AND CKMB (NOT AT ARMC)
CK, MB: 1.6
Relative Index: 0.7
Total CK: 220 — ABNORMAL HIGH

## 2011-04-07 LAB — IRON AND TIBC
Iron: 20 — ABNORMAL LOW
Saturation Ratios: 6 — ABNORMAL LOW
TIBC: 324
UIBC: 304

## 2011-04-07 LAB — TSH: TSH: 0.162 — ABNORMAL LOW

## 2011-04-07 LAB — TROPONIN I: Troponin I: 0.01

## 2011-04-07 LAB — FOLATE: Folate: 9.6

## 2011-04-07 LAB — FERRITIN: Ferritin: 334 — ABNORMAL HIGH (ref 10–291)

## 2011-04-07 LAB — D-DIMER, QUANTITATIVE: D-Dimer, Quant: 2.4 — ABNORMAL HIGH

## 2011-04-07 LAB — T3, FREE: T3, Free: 2.9 (ref 2.3–4.2)

## 2011-04-07 LAB — POTASSIUM: Potassium: 4.5

## 2011-04-07 LAB — T4, FREE: Free T4: 1.2

## 2011-04-07 LAB — VITAMIN B12: Vitamin B-12: 438 (ref 211–911)

## 2011-06-05 ENCOUNTER — Telehealth: Payer: Self-pay | Admitting: Cardiovascular Disease

## 2011-06-05 NOTE — Telephone Encounter (Signed)
Pt needs refill of losartin, pt out now, said [facility] has been requesting for two weeks, uses [facility]

## 2011-06-06 NOTE — Telephone Encounter (Signed)
Called in to pharmacy gave pt 1 year supply number to pharmacy was [phone]

## 2011-07-27 ENCOUNTER — Encounter (HOSPITAL_COMMUNITY): Payer: Self-pay

## 2011-07-27 ENCOUNTER — Inpatient Hospital Stay (HOSPITAL_COMMUNITY)

## 2011-07-27 ENCOUNTER — Inpatient Hospital Stay (HOSPITAL_COMMUNITY)
Admission: AD | Admit: 2011-07-27 | Discharge: 2011-07-27 | Disposition: A | Discharge status: Home / Self Care | Source: Ambulatory Visit | Attending: Obstetrics & Gynecology | Admitting: Obstetrics & Gynecology

## 2011-07-27 ENCOUNTER — Emergency Department (HOSPITAL_COMMUNITY)
Admission: EM | Admit: 2011-07-27 | Discharge: 2011-07-27 | Disposition: A | Discharge status: Other Acute Inpatient Hospital | Payer: Self-pay | Source: Home / Self Care

## 2011-07-27 DIAGNOSIS — N938 Other specified abnormal uterine and vaginal bleeding: Secondary | ICD-10-CM | POA: Insufficient documentation

## 2011-07-27 DIAGNOSIS — N925 Other specified irregular menstruation: Secondary | ICD-10-CM | POA: Insufficient documentation

## 2011-07-27 DIAGNOSIS — N898 Other specified noninflammatory disorders of vagina: Secondary | ICD-10-CM

## 2011-07-27 DIAGNOSIS — N949 Unspecified condition associated with female genital organs and menstrual cycle: Secondary | ICD-10-CM | POA: Insufficient documentation

## 2011-07-27 DIAGNOSIS — N920 Excessive and frequent menstruation with regular cycle: Secondary | ICD-10-CM

## 2011-07-27 DIAGNOSIS — N939 Abnormal uterine and vaginal bleeding, unspecified: Secondary | ICD-10-CM

## 2011-07-27 LAB — POCT URINALYSIS DIP (DEVICE)
Glucose, UA: NEGATIVE mg/dL
Leukocytes, UA: NEGATIVE
Nitrite: NEGATIVE
Protein, ur: 100 mg/dL — AB
Specific Gravity, Urine: 1.02 (ref 1.005–1.030)
Urobilinogen, UA: 1 mg/dL (ref 0.0–1.0)
pH: 7 (ref 5.0–8.0)

## 2011-07-27 LAB — POCT I-STAT, CHEM 8
BUN: 11 mg/dL (ref 6–23)
Calcium, Ion: 1.15 mmol/L (ref 1.12–1.32)
Chloride: 99 mEq/L (ref 96–112)
Creatinine, Ser: 1 mg/dL (ref 0.50–1.10)
Glucose, Bld: 93 mg/dL (ref 70–99)
HCT: 36 % (ref 36.0–46.0)
Hemoglobin: 12.2 g/dL (ref 12.0–15.0)
Potassium: 3.4 mEq/L — ABNORMAL LOW (ref 3.5–5.1)
Sodium: 139 mEq/L (ref 135–145)
TCO2: 27 mmol/L (ref 0–100)

## 2011-07-27 LAB — WET PREP, GENITAL
Clue Cells Wet Prep HPF POC: NONE SEEN
Trich, Wet Prep: NONE SEEN
Yeast Wet Prep HPF POC: NONE SEEN

## 2011-07-27 LAB — POCT PREGNANCY, URINE: Preg Test, Ur: NEGATIVE

## 2011-07-27 MED ORDER — MEDROXYPROGESTERONE ACETATE 10 MG PO TABS: 10.0000 mg | ORAL_TABLET | Freq: Every day | ORAL | Status: DC

## 2011-07-27 NOTE — ED Notes (Signed)
Pt c/o heavy vaginal bleeding with clots since 7/12, states will stop for 1/2 day then bleeding with begin again.  States has been trying to get into [facility] but they have a long waiting list.

## 2011-07-27 NOTE — ED Provider Notes (Signed)
History     CSN: [id number]  Arrival date & time 07/27/11  1037   None     Chief Complaint  Patient presents with  . Vaginal Bleeding    heavy vaginal bleeding since July 2012    (Consider location/radiation/quality/duration/timing/severity/associated sxs/prior treatment) HPI Comments: Patient presents today stating that she's had heavy vaginal bleeding daily since July of 2012. She states that the bleeding may let up for half a day, or maybe a whole day but then resumes. The bleeding has worsened in the last 2 weeks. It has become heavier with clots. She states that she is now wearing 2 pads and a tampon and changing her protection every 2 hours. She denies any pelvic pain. She admits that she has become dizzy in the last 2-3 weeks. She states that she only notices the dizziness while lying down. Patient initially stated that prior to July 2012 her periods were irregular, usually bleeding every 2 months or sometimes going longer between periods. She then stated that in 2011 she did have an episode of heavy vaginal bleeding. Her records do show that she had a pelvic ultrasound in June of 2011 that showed a thickened endometrium and complex ovarian cyst. She did have a followup pelvic ultrasound in August of 2011 which was negative. Patient states that she's had no further followup since that time.   Past Medical History  Diagnosis Date  . Hypertension   . Mitral regurgitation   . Depression   . Gout   . Anemia, iron deficiency     Past Surgical History  Procedure Date  . Knee arthroscopy     Left    Family History  Problem Relation Age of Onset  . Heart disease Sister   . Heart disease Brother     History  Substance Use Topics  . Smoking status: Current Everyday Smoker -- 0.5 packs/day    Types: Cigarettes  . Smokeless tobacco: Never Used  . Alcohol Use: No    OB History    Grav Para Term Preterm Abortions TAB SAB Ect Mult Living                  Review of  Systems  Respiratory: Negative for cough and shortness of breath.   Cardiovascular: Negative for chest pain and palpitations.  Genitourinary: Positive for vaginal bleeding. Negative for vaginal discharge, vaginal pain and pelvic pain.  Neurological: Positive for dizziness. Negative for headaches.    Allergies  Review of patient's allergies indicates no known allergies.  Home Medications   Current Outpatient Rx  Name Route Sig Dispense Refill  . AMLODIPINE BESYLATE 10 MG PO TABS Oral Take 10 mg by mouth daily.      . BUPROPION HCL ER (XL) 300 MG PO TB24 Oral Take 150 mg by mouth 2 (two) times daily.     Marland Kitchen HYDROCHLOROTHIAZIDE 25 MG PO TABS Oral Take 25 mg by mouth daily.      Marland Kitchen LOSARTAN POTASSIUM 25 MG PO TABS Oral Take 25 mg by mouth daily.      Marland Kitchen NAPROXEN 500 MG PO TABS Oral Take 500 mg by mouth daily as needed.      . SERTRALINE HCL 50 MG PO TABS Oral Take 50 mg by mouth daily.        BP 143/90  Pulse 88  Temp(Src) 98 F (36.7 C) (Oral)  Resp 18  SpO2 97%  LMP 07/27/2011  Physical Exam  Nursing note and vitals reviewed. Cardiovascular: Normal rate,  regular rhythm and normal heart sounds.   Pulmonary/Chest: Effort normal and breath sounds normal. No respiratory distress.  Abdominal: Soft. Bowel sounds are normal. She exhibits no distension and no mass. There is no tenderness.  Genitourinary: Uterus normal. There is no rash, tenderness or lesion on the right labia. There is no rash, tenderness or lesion on the left labia. Cervix exhibits no motion tenderness, no discharge and no friability. Right adnexum displays no mass, no tenderness and no fullness. Left adnexum displays no mass, no tenderness and no fullness. There is bleeding (minimal bleeding with small clot at os) around the vagina. No erythema or tenderness around the vagina. No foreign body around the vagina. No signs of injury around the vagina. No vaginal discharge found.  Neurological: She is alert.  Skin: Skin is warm  and dry.  Psychiatric: She has a normal mood and affect.    ED Course  Procedures (including critical care time)  Labs Reviewed  POCT URINALYSIS DIP (DEVICE) - Abnormal; Notable for the following:    Bilirubin Urine SMALL (*)    Ketones, ur TRACE (*)    Hgb urine dipstick LARGE (*)    Protein, ur 100 (*)    All other components within normal limits  WET PREP, GENITAL - Abnormal; Notable for the following:    WBC, Wet Prep HPF POC FEW (*)    All other components within normal limits  POCT I-STAT, CHEM 8 - Abnormal; Notable for the following:    Potassium 3.4 (*)    All other components within normal limits  POCT PREGNANCY, URINE  POCT URINALYSIS DIPSTICK  POCT PREGNANCY, URINE  I-STAT, CHEM 8  GC/CHLAMYDIA PROBE AMP, GENITAL   No results found.   1. Menorrhagia       MDM  Pt to go via private vehicle to [facility] ED for further evaluation/Pelvic US.         Melody Comas, [place] 07/27/11 1255

## 2011-07-27 NOTE — ED Provider Notes (Signed)
History     Chief Complaint  Patient presents with  . Vaginal Bleeding   HPI Stephanie Joseph 51 y.o. Comes to MAU today from [facility] for an ultrasound.  Has been having bleeding since July 2012.  Had full exam and labs at Urgent Care.  Is a client of [facility].  OB History    Grav Para Term Preterm Abortions TAB SAB Ect Mult Living   3 1   2 1 1   1       Past Medical History  Diagnosis Date  . Hypertension   . Mitral regurgitation   . Depression   . Gout   . Anemia, iron deficiency     Past Surgical History  Procedure Date  . Knee arthroscopy     Left    Family History  Problem Relation Age of Onset  . Heart disease Sister   . Heart disease Brother     History  Substance Use Topics  . Smoking status: Current Everyday Smoker -- 0.5 packs/day    Types: Cigarettes  . Smokeless tobacco: Never Used  . Alcohol Use: No    Allergies: No Known Allergies  Prescriptions prior to admission  Medication Sig Dispense Refill  . amLODipine (NORVASC) 10 MG tablet Take 10 mg by mouth daily.        Marland Kitchen buPROPion (WELLBUTRIN XL) 300 MG 24 hr tablet Take 150 mg by mouth 2 (two) times daily.       . hydrochlorothiazide 25 MG tablet Take 25 mg by mouth daily.        Marland Kitchen losartan (COZAAR) 25 MG tablet Take 25 mg by mouth daily.        . naproxen (NAPROSYN) 500 MG tablet Take 500 mg by mouth daily as needed.        . sertraline (ZOLOFT) 50 MG tablet Take 50 mg by mouth daily.          ROS Physical Exam   Blood pressure 144/83, pulse 94, temperature 98.9 F (37.2 C), temperature source Oral, resp. rate 18, height 5\' 7"  (1.702 m), weight 235 lb (106.595 kg), last menstrual period 07/27/2011.  Physical Exam  Nursing note and vitals reviewed. Constitutional: She is oriented to person, place, and time. She appears well-developed and well-nourished.  HENT:  Head: Normocephalic.  Eyes: EOM are normal.  Neck: Neck supple.  Musculoskeletal: Normal range of motion.    Neurological: She is alert and oriented to person, place, and time.  Skin: Skin is warm and dry.  Psychiatric: She has a normal mood and affect.    MAU Course  Procedures Results for orders placed during the hospital encounter of 07/27/11 (from the past 24 hour(s))  POCT URINALYSIS DIP (DEVICE)     Status: Abnormal   Collection Time   07/27/11 12:04 PM      Component Value Range   Glucose, UA NEGATIVE  NEGATIVE (mg/dL)   Bilirubin Urine SMALL (*) NEGATIVE    Ketones, ur TRACE (*) NEGATIVE (mg/dL)   Specific Gravity, Urine 1.020  1.005 - 1.030    Hgb urine dipstick LARGE (*) NEGATIVE    pH 7.0  5.0 - 8.0    Protein, ur 100 (*) NEGATIVE (mg/dL)   Urobilinogen, UA 1.0  0.0 - 1.0 (mg/dL)   Nitrite NEGATIVE  NEGATIVE    Leukocytes, UA NEGATIVE  NEGATIVE   POCT PREGNANCY, URINE     Status: Normal   Collection Time   07/27/11 12:08 PM  Component Value Range   Preg Test, Ur NEGATIVE    WET PREP, GENITAL     Status: Abnormal   Collection Time   07/27/11 12:15 PM      Component Value Range   Yeast, Wet Prep NONE SEEN  NONE SEEN    Trich, Wet Prep NONE SEEN  NONE SEEN    Clue Cells, Wet Prep NONE SEEN  NONE SEEN    WBC, Wet Prep HPF POC FEW (*) NONE SEEN   POCT I-STAT, CHEM 8     Status: Abnormal   Collection Time   07/27/11 12:26 PM      Component Value Range   Sodium 139  135 - 145 (mEq/L)   Potassium 3.4 (*) 3.5 - 5.1 (mEq/L)   Chloride 99  96 - 112 (mEq/L)   BUN 11  6 - 23 (mg/dL)   Creatinine, Ser 1.61  0.50 - 1.10 (mg/dL)   Glucose, Bld 93  70 - 99 (mg/dL)   Calcium, Ion 0.96  0.45 - 1.32 (mmol/L)   TCO2 27  0 - 100 (mmol/L)   Hemoglobin 12.2  12.0 - 15.0 (g/dL)   HCT 40.9  81.1 - 91.4 (%)     MDM Clinical Data: Dysfunctional uterine bleeding  TRANSABDOMINAL AND TRANSVAGINAL ULTRASOUND OF PELVIS  Technique: Both transabdominal and transvaginal ultrasound  examinations of the pelvis were performed. Transabdominal technique  was performed for global imaging of the  pelvis including uterus,  ovaries, adnexal regions, and pelvic cul-de-sac.  Comparison: 02/25/2010  It was necessary to proceed with endovaginal exam following the  transabdominal exam to visualize the left ovary, not seen  transabdominally, and endometrium.  Findings:  Uterus: 7.0 x 4.2 x 4.1 cm. Anteverted, anteflexed. No focal  abnormality.  Endometrium: 8 mm. Mildly inhomogeneous but no focal abnormality  identified.  Right ovary: 2.1 x 1.2 x 0.9 cm. Normal.  Left ovary: 3.5 x 2.7 x 2.2 cm. Simple appearing cyst measures 3.4  x 2.3 x 2.3 cm.  Other findings: No free fluid  IMPRESSION:  Normal exam.    Assessment and Plan  Abnormal vaginal bleeding  Plan Needs to be seen in GYN clinic for followup - message sent to clinic Will given Provera 10 mg x 10 days to stop bleeding. Return if bleeding worsens.   Stephanie Joseph 07/27/2011, 2:34 PM   Nolene Bernheim, NP 07/27/11 1542

## 2011-07-27 NOTE — Progress Notes (Signed)
Patient c/o of abnormal vaginal bleeding since last 7/12 heavy at times was seen at urgent care and sent for an ultrasound patient denies pain, denies history of uterine fibroids.

## 2011-07-28 LAB — GC/CHLAMYDIA PROBE AMP, GENITAL
Chlamydia, DNA Probe: NEGATIVE
GC Probe Amp, Genital: NEGATIVE

## 2011-07-29 NOTE — ED Provider Notes (Signed)
Medical screening examination/treatment/procedure(s) were performed by non-physician practitioner and as supervising physician I was immediately available for consultation/collaboration.   Barkley Bruns MD.    Barkley Bruns, MD 07/29/11 [phone]

## 2011-08-09 ENCOUNTER — Inpatient Hospital Stay (HOSPITAL_COMMUNITY)
Admission: AD | Admit: 2011-08-09 | Discharge: 2011-08-09 | Disposition: A | Discharge status: Home / Self Care | Source: Ambulatory Visit | Attending: Family Medicine | Admitting: Family Medicine

## 2011-08-09 ENCOUNTER — Encounter (HOSPITAL_COMMUNITY): Payer: Self-pay | Admitting: *Deleted

## 2011-08-09 DIAGNOSIS — N924 Excessive bleeding in the premenopausal period: Secondary | ICD-10-CM

## 2011-08-09 DIAGNOSIS — D649 Anemia, unspecified: Secondary | ICD-10-CM | POA: Insufficient documentation

## 2011-08-09 DIAGNOSIS — N920 Excessive and frequent menstruation with regular cycle: Secondary | ICD-10-CM | POA: Insufficient documentation

## 2011-08-09 HISTORY — DX: Dysmenorrhea, unspecified: N94.6

## 2011-08-09 HISTORY — DX: Excessive and frequent menstruation with regular cycle: N92.0

## 2011-08-09 HISTORY — DX: Pneumonia, unspecified organism: J18.9

## 2011-08-09 LAB — URINALYSIS, MICROSCOPIC ONLY
Bilirubin Urine: NEGATIVE
Glucose, UA: NEGATIVE mg/dL
Ketones, ur: 15 mg/dL — AB
Leukocytes, UA: NEGATIVE
Nitrite: NEGATIVE
Protein, ur: 30 mg/dL — AB
Specific Gravity, Urine: 1.02 (ref 1.005–1.030)
Urobilinogen, UA: 1 mg/dL (ref 0.0–1.0)
pH: 6 (ref 5.0–8.0)

## 2011-08-09 LAB — CBC
HCT: 29.5 % — ABNORMAL LOW (ref 36.0–46.0)
Hemoglobin: 9.5 g/dL — ABNORMAL LOW (ref 12.0–15.0)
MCH: 26.8 pg (ref 26.0–34.0)
MCHC: 32.2 g/dL (ref 30.0–36.0)
MCV: 83.3 fL (ref 78.0–100.0)
Platelets: 385 10*3/uL (ref 150–400)
RBC: 3.54 MIL/uL — ABNORMAL LOW (ref 3.87–5.11)
RDW: 14.7 % (ref 11.5–15.5)
WBC: 6.2 10*3/uL (ref 4.0–10.5)

## 2011-08-09 MED ORDER — MEDROXYPROGESTERONE ACETATE 10 MG PO TABS: 10.0000 mg | ORAL_TABLET | Freq: Every day | ORAL | Status: DC

## 2011-08-09 NOTE — ED Provider Notes (Signed)
History   Stephanie Joseph is a 51 y.o. year old [id number] female who presents to MAU reporting heavy bleeding and passing clots. She denies dizziness or syncope. She states she has been bleeding nearly continuously since July 2012. She was seen in the ED and transferred to MAU 07/27/11 for Menorrhagia and had a normal [place]. Bleeding lessened on Provera and has helped in the past. She is supposed to be scheduled for F/U visit in GYN clinic, but not been called. She would like to take another round of Provera.   CSN: [id number]  Arrival date & time 08/09/11  1012   None     Chief Complaint  Patient presents with  . Vaginal Bleeding    (Consider location/radiation/quality/duration/timing/severity/associated sxs/prior treatment) HPI  Past Medical History  Diagnosis Date  . Hypertension   . Mitral regurgitation   . Depression   . Gout   . Anemia, iron deficiency   . Dysmenorrhea   . Menorrhagia   . Pneumonia     Past Surgical History  Procedure Date  . Knee arthroscopy     Left    Family History  Problem Relation Age of Onset  . Heart disease Sister   . Heart disease Brother     History  Substance Use Topics  . Smoking status: Current Everyday Smoker -- 0.5 packs/day    Types: Cigarettes  . Smokeless tobacco: Never Used  . Alcohol Use: No    OB History    Grav Para Term Preterm Abortions TAB SAB Ect Mult Living   3 1  1 2 1 1   1       Review of Systems: Otherwise neg  Allergies  Review of patient's allergies indicates no known allergies.  Home Medications  No current outpatient prescriptions on file.  BP 130/92  Pulse 82  Temp(Src) 97.9 F (36.6 C) (Oral)  Resp 18  Ht 5\' 7"  (1.702 m)  Wt 107.502 kg (237 lb)  BMI 37.12 kg/m2  LMP 08/09/2011  Physical Exam Mod BRB in vault w/ small clot. Small amount of blood oozing from os. No polyps. Uterus normal size, NT.  ED Course  Procedures (including critical care time)  Labs Reviewed  URINALYSIS, WITH  MICROSCOPIC - Abnormal; Notable for the following:    Color, Urine ORANGE (*) BIOCHEMICALS MAY BE AFFECTED BY COLOR   APPearance HAZY (*)    Hgb urine dipstick LARGE (*)    Ketones, ur 15 (*)    Protein, ur 30 (*)    Bacteria, UA FEW (*)    Squamous Epithelial / LPF FEW (*)    All other components within normal limits  CBC - Abnormal; Notable for the following:    RBC 3.54 (*)    Hemoglobin 9.5 (*)    HCT 29.5 (*)    All other components within normal limits   No results found.   1. Premenopausal menorrhagia   2. Anemia       MDM  Assessment: 1. Menorrhagia, likely perimenopausal 2. Mild anemia, hemodynamically stable and asymptomatic  Plan: 1. Discussed alternative medications for acute management, ablation or hysterectomy for definitive Tx and need for EBX in clinic. Pt would like to use Provera again. 2. Gyn Clinic requested 3. Bleeding precautions 4. FeSO4 PO BID  Dorathy Kinsman 08/09/2011 1:44 PM

## 2011-08-09 NOTE — Progress Notes (Signed)
Last took Provera this week, no change in bleeding, requesting more, has had to have more than one round in the past.  Encouraged to call GYN clinic, states has been referred but has not received a call.

## 2011-08-09 NOTE — Progress Notes (Signed)
Stephanie Joseph is a non pregnant female. Presents to MAU with vaginal bleeding; large, changes 2 pads an hr. Was here recently and prescribed provera. Needs a new prescription due to not able to be seen in the clinic.

## 2011-08-09 NOTE — ED Provider Notes (Signed)
Chart reviewed and agree with management and plan.  

## 2011-09-01 ENCOUNTER — Ambulatory Visit (INDEPENDENT_AMBULATORY_CARE_PROVIDER_SITE_OTHER): Admitting: Obstetrics and Gynecology

## 2011-09-01 ENCOUNTER — Encounter: Payer: Self-pay | Admitting: Advanced Practice Midwife

## 2011-09-01 ENCOUNTER — Other Ambulatory Visit (HOSPITAL_COMMUNITY)
Admission: RE | Admit: 2011-09-01 | Discharge: 2011-09-01 | Disposition: A | Discharge status: Home / Self Care | Source: Ambulatory Visit | Attending: Obstetrics and Gynecology | Admitting: Obstetrics and Gynecology

## 2011-09-01 VITALS — BP 112/75 | HR 84 | Temp 98.6°F | Ht 67.0 in | Wt 236.5 lb

## 2011-09-01 DIAGNOSIS — N920 Excessive and frequent menstruation with regular cycle: Secondary | ICD-10-CM

## 2011-09-01 DIAGNOSIS — N938 Other specified abnormal uterine and vaginal bleeding: Secondary | ICD-10-CM | POA: Insufficient documentation

## 2011-09-01 DIAGNOSIS — N925 Other specified irregular menstruation: Secondary | ICD-10-CM | POA: Insufficient documentation

## 2011-09-01 DIAGNOSIS — N924 Excessive bleeding in the premenopausal period: Secondary | ICD-10-CM | POA: Insufficient documentation

## 2011-09-01 DIAGNOSIS — N949 Unspecified condition associated with female genital organs and menstrual cycle: Secondary | ICD-10-CM | POA: Insufficient documentation

## 2011-09-01 DIAGNOSIS — Z124 Encounter for screening for malignant neoplasm of cervix: Secondary | ICD-10-CM

## 2011-09-01 DIAGNOSIS — Z01812 Encounter for preprocedural laboratory examination: Secondary | ICD-10-CM

## 2011-09-01 LAB — POCT PREGNANCY, URINE: Preg Test, Ur: NEGATIVE

## 2011-09-01 NOTE — Progress Notes (Signed)
Addended by: Sherre Lain A on: 09/01/2011 04:36 PM   Modules accepted: Orders

## 2011-09-01 NOTE — Progress Notes (Signed)
Stephanie Joseph [id number]  Chief Complaint  Patient presents with  . Menorrhagia    SUBJECTIVE  HPI: Seen in MAU 08/09/2011 with hx bleeding nearly continuously since 01/2011 at times heavy with clots. The bleeding was somewhat alleviated by Provera but she is not on Provera at present and not bleeding. She had pelvic US 07/27/2011 that revealed normal size uterus, endometrial stripe 8 mm, no focal abnormalities. Hgb 9.5 08/09/2011 and not taking iron. She would like some type of definitive treatment, possibly ablation. Last Pap normal 1.5 yrs ago at [facility].    Past Medical History  Diagnosis Date  . Hypertension   . Mitral regurgitation   . Depression   . Gout   . Anemia, iron deficiency   . Dysmenorrhea   . Menorrhagia   . Pneumonia    Past Surgical History  Procedure Date  . Knee arthroscopy     Left   History   Social History  . Marital Status: Married    Spouse Name: N/A    Number of Children: N/A  . Years of Education: N/A   Occupational History  . Not on file.   Social History Main Topics  . Smoking status: Current Everyday Smoker -- 0.5 packs/day    Types: Cigarettes  . Smokeless tobacco: Never Used  . Alcohol Use: No  . Drug Use: Yes    Special: "Crack" cocaine     in the past  . Sexually Active: Yes    Birth Control/ Protection: None   Other Topics Concern  . Not on file   Social History Narrative  . No narrative on file   Current Outpatient Prescriptions on File Prior to Visit  Medication Sig Dispense Refill  . amLODipine (NORVASC) 10 MG tablet Take 10 mg by mouth daily.        Marland Kitchen buPROPion (WELLBUTRIN XL) 300 MG 24 hr tablet Take 150 mg by mouth 2 (two) times daily.       . hydrochlorothiazide 25 MG tablet Take 25 mg by mouth daily.        Marland Kitchen losartan (COZAAR) 25 MG tablet Take 25 mg by mouth daily.        . meloxicam (MOBIC) 15 MG tablet Take 15 mg by mouth daily.      . sertraline (ZOLOFT) 50 MG tablet Take 50 mg by mouth daily.        .  medroxyPROGESTERone (PROVERA) 10 MG tablet Take 1 tablet (10 mg total) by mouth daily.  10 tablet  1   No Known Allergies  ROS: Pertinent items in HPI  OBJECTIVE Blood pressure 112/75, pulse 84, temperature 98.6 F (37 C), temperature source Oral, height 5\' 7"  (1.702 m), weight 236 lb 8 oz (107.276 kg), last menstrual period 08/09/2011. GENERAL: Well-developed, well-nourished female in no acute distress.  EXTREMITIES: Nontender, no edema EXTERNAL GENITALIA: normal VAGINA: physiologic discharge CERVIX: appears nulliparous (but SVD 21 yrs ago), no lesions, midline. Pap done  PROCEDURE NOTE Patient given informed consent, signed copy in the chart, time out was performed. Appropriate time out taken. . The patient was placed in the lithotomy position and the cervix brought into view with sterile speculum.  Portio of cervix cleansed x 2 with betadine swabs.  A tenaculum was placed in the anterior lip of the cervix. Pratt dilators x 2 prior to successful sounding. The uterus was sounded for depth of 10cm. A pipelle was introduced to into the uterus, suction created,  and an endometrial sample was obtained. All  equipment was removed and accounted for.  The patient tolerated the procedure well.    Patient given post procedure instructions. The patient will return in 2 weeks for results.  ASSESSMENT Premenopausal menorrhagia Diabetes Hypertension Smoker  PLAN Advised iron supplementation Return in 2 wks for results of endometrial biopsy and POC

## 2011-09-01 NOTE — Patient Instructions (Addendum)
Abnormal Uterine Bleeding Abnormal uterine bleeding can have many causes. Some cases are simply treated, while others are more serious. There are several kinds of bleeding that is considered abnormal, including:  Bleeding between periods.   Bleeding after sexual intercourse.   Spotting anytime in the menstrual cycle.   Bleeding heavier or more than normal.   Bleeding after menopause.  CAUSES  There are many causes of abnormal uterine bleeding. It can be present in teenagers, pregnant women, women during their reproductive years, and women who have reached menopause. Your caregiver will look for the more common causes depending on your age, signs, symptoms and your particular circumstance. Most cases are not serious and can be treated. Even the more serious causes, like cancer of the female organs, can be treated adequately if found in the early stages. That is why all types of bleeding should be evaluated and treated as soon as possible. DIAGNOSIS  Diagnosing the cause may take several kinds of tests. Your caregiver may:  Take a complete history of the type of bleeding.   Perform a complete physical exam and Pap smear.   Take an ultrasound on the abdomen showing a picture of the female organs and the pelvis.   Inject dye into the uterus and Fallopian tubes and X-ray them (hysterosalpingogram).   Place fluid in the uterus and do an ultrasound (sonohysterogrqphy).   Take a CT scan to examine the female organs and pelvis.   Take an MRI to examine the female organs and pelvis. There is no X-ray involved with this procedure.   Look inside the uterus with a telescope that has a light at the end (hysteroscopy).   Scrap the inside of the uterus to get tissue to examine (Dilatation and Curettage, D&C).   Look into the pelvis with a telescope that has a light at the end (laparoscopy). This is done through a very small cut (incision) in the abdomen.  TREATMENT  Treatment will depend on the  cause of the abnormal bleeding. It can include:  Doing nothing to allow the problem to take care of itself over time.   Hormone treatment.   Birth control pills.   Treating the medical condition causing the problem.   Laparoscopy.   Major or minor surgery   Destroying the lining of the uterus with electrical currant, laser, freezing or heat (uterine ablation).  HOME CARE INSTRUCTIONS   Follow your caregiver's recommendation on how to treat your problem.   See your caregiver if you missed a menstrual period and think you may be pregnant.   If you are bleeding heavily, count the number of pads/tampons you use and how often you have to change them. Tell this to your caregiver.   Avoid sexual intercourse until the problem is controlled.  SEEK MEDICAL CARE IF:   You have any kind of abnormal bleeding mentioned above.   You feel dizzy at times.   You are 51 years old and have not had a menstrual period yet.  SEEK IMMEDIATE MEDICAL CARE IF:   You pass out.   You are changing pads/tampons every 15 to 30 minutes.   You have belly (abdominal) pain.   You have a temperature of 100 F (37.8 C) or higher.   You become sweaty or weak.   You are passing large blood clots from the vagina.   You start to feel sick to your stomach (nauseous) and throw up (vomit).  Document Released: 06/23/2005 Document Revised: 03/05/2011 Document Reviewed: 11/16/2008 [employer]   Patient Information 2012 [employer].  Smoking Cessation This document explains the best ways for you to quit smoking and new treatments to help. It lists new medicines that can double or triple your chances of quitting and quitting for good. It also considers ways to avoid relapses and concerns you may have about quitting, including weight gain. NICOTINE: A POWERFUL ADDICTION If you have tried to quit smoking, you know how hard it can be. It is hard because nicotine is a very addictive drug. For some people, it can be as  addictive as heroin or cocaine. Usually, people make 2 or 3 tries, or more, before finally being able to quit. Each time you try to quit, you can learn about what helps and what hurts. Quitting takes hard work and a lot of effort, but you can quit smoking. QUITTING SMOKING IS ONE OF THE MOST IMPORTANT THINGS YOU WILL EVER DO.  You will live longer, feel better, and live better.   The impact on your body of quitting smoking is felt almost immediately:   Within 20 minutes, blood pressure decreases. Pulse returns to its normal level.   After 8 hours, carbon monoxide levels in the blood return to normal. Oxygen level increases.   After 24 hours, chance of heart attack starts to decrease. Breath, hair, and body stop smelling like smoke.   After 48 hours, damaged nerve endings begin to recover. Sense of taste and smell improve.   After 72 hours, the body is virtually free of nicotine. Bronchial tubes relax and breathing becomes easier.   After 2 to 12 weeks, lungs can hold more air. Exercise becomes easier and circulation improves.   Quitting will reduce your risk of having a heart attack, stroke, cancer, or lung disease:   After 1 year, the risk of coronary heart disease is cut in half.   After 5 years, the risk of stroke falls to the same as a nonsmoker.   After 10 years, the risk of lung cancer is cut in half and the risk of other cancers decreases significantly.   After 15 years, the risk of coronary heart disease drops, usually to the level of a nonsmoker.   If you are pregnant, quitting smoking will improve your chances of having a healthy baby.   The people you live with, especially your children, will be healthier.   You will have extra money to spend on things other than cigarettes.  FIVE KEYS TO QUITTING Studies have shown that these 5 steps will help you quit smoking and quit for good. You have the best chances of quitting if you use them together: 1. Get ready.  2. Get  support and encouragement.  3. Learn new skills and behaviors.  4. Get medicine to reduce your nicotine addiction and use it correctly.  5. Be prepared for relapse or difficult situations. Be determined to continue trying to quit, even if you do not succeed at first.  1. GET READY  Set a quit date.   Change your environment.   Get rid of ALL cigarettes, ashtrays, matches, and lighters in your home, car, and place of work.   Do not let people smoke in your home.   Review your past attempts to quit. Think about what worked and what did not.   Once you quit, do not smoke. NOT EVEN A PUFF!  2. GET SUPPORT AND ENCOURAGEMENT Studies have shown that you have a better chance of being successful if you have help. You can   get support in many ways.  Tell your family, friends, and coworkers that you are going to quit and need their support. Ask them not to smoke around you.   Talk to your caregivers (doctor, dentist, nurse, pharmacist, psychologist, and/or smoking counselor).   Get individual, group, or telephone counseling and support. The more counseling you have, the better your chances are of quitting. Programs are available at local hospitals and health centers. Call your local health department for information about programs in your area.   Spiritual beliefs and practices may help some smokers quit.   Quit meters are small computer programs online or downloadable that keep track of quit statistics, such as amount of "quit-time," cigarettes not smoked, and money saved.   Many smokers find one or more of the many self-help books available useful in helping them quit and stay off tobacco.  3. LEARN NEW SKILLS AND BEHAVIORS  Try to distract yourself from urges to smoke. Talk to someone, go for a walk, or occupy your time with a task.   When you first try to quit, change your routine. Take a different route to work. Drink tea instead of coffee. Eat breakfast in a different place.   Do  something to reduce your stress. Take a hot bath, exercise, or read a book.   Plan something enjoyable to do every day. Reward yourself for not smoking.   Explore interactive web-based programs that specialize in helping you quit.  4. GET MEDICINE AND USE IT CORRECTLY Medicines can help you stop smoking and decrease the urge to smoke. Combining medicine with the above behavioral methods and support can quadruple your chances of successfully quitting smoking. The [place] Food and Drug Administration (FDA) has approved 7 medicines to help you quit smoking. These medicines fall into 3 categories.  Nicotine replacement therapy (delivers nicotine to your body without the negative effects and risks of smoking):   Nicotine gum: Available over-the-counter.   Nicotine lozenges: Available over-the-counter.   Nicotine inhaler: Available by prescription.   Nicotine nasal spray: Available by prescription.   Nicotine skin patches (transdermal): Available by prescription and over-the-counter.   Antidepressant medicine (helps people abstain from smoking, but how this works is unknown):   Bupropion sustained-release (SR) tablets: Available by prescription.   Nicotinic receptor partial agonist (simulates the effect of nicotine in your brain):   Varenicline tartrate tablets: Available by prescription.   Ask your caregiver for advice about which medicines to use and how to use them. Carefully read the information on the package.   Everyone who is trying to quit may benefit from using a medicine. If you are pregnant or trying to become pregnant, nursing an infant, you are under age 18, or you smoke fewer than 10 cigarettes per day, talk to your caregiver before taking any nicotine replacement medicines.   You should stop using a nicotine replacement product and call your caregiver if you experience nausea, dizziness, weakness, vomiting, fast or irregular heartbeat, mouth problems with the lozenge or gum, or  redness or swelling of the skin around the patch that does not go away.   Do not use any other product containing nicotine while using a nicotine replacement product.   Talk to your caregiver before using these products if you have diabetes, heart disease, asthma, stomach ulcers, you had a recent heart attack, you have high blood pressure that is not controlled with medicine, a history of irregular heartbeat, or you have been prescribed medicine to help you quit smoking.    5. BE PREPARED FOR RELAPSE OR DIFFICULT SITUATIONS  Most relapses occur within the first 3 months after quitting. Do not be discouraged if you start smoking again. Remember, most people try several times before they finally quit.   You may have symptoms of withdrawal because your body is used to nicotine. You may crave cigarettes, be irritable, feel very hungry, cough often, get headaches, or have difficulty concentrating.   The withdrawal symptoms are only temporary. They are strongest when you first quit, but they will go away within 10 to 14 days.  Here are some difficult situations to watch for:  Alcohol. Avoid drinking alcohol. Drinking lowers your chances of successfully quitting.   Caffeine. Try to reduce the amount of caffeine you consume. It also lowers your chances of successfully quitting.   Other smokers. Being around smoking can make you want to smoke. Avoid smokers.   Weight gain. Many smokers will gain weight when they quit, usually less than 10 pounds. Eat a healthy diet and stay active. Do not let weight gain distract you from your main goal, quitting smoking. Some medicines that help you quit smoking may also help delay weight gain. You can always lose the weight gained after you quit.   Bad mood or depression. There are a lot of ways to improve your mood other than smoking.  If you are having problems with any of these situations, talk to your caregiver. SPECIAL SITUATIONS AND CONDITIONS Studies suggest  that everyone can quit smoking. Your situation or condition can give you a special reason to quit.  Pregnant women/new mothers: By quitting, you protect your baby's health and your own.   Hospitalized patients: By quitting, you reduce health problems and help healing.   Heart attack patients: By quitting, you reduce your risk of a second heart attack.   Lung, head, and neck cancer patients: By quitting, you reduce your chance of a second cancer.   Parents of children and adolescents: By quitting, you protect your children from illnesses caused by secondhand smoke.  QUESTIONS TO THINK ABOUT Think about the following questions before you try to stop smoking. You may want to talk about your answers with your caregiver.  Why do you want to quit?   If you tried to quit in the past, what helped and what did not?   What will be the most difficult situations for you after you quit? How will you plan to handle them?   Who can help you through the tough times? Your family? Friends? Caregiver?   What pleasures do you get from smoking? What ways can you still get pleasure if you quit?  Here are some questions to ask your caregiver:  How can you help me to be successful at quitting?   What medicine do you think would be best for me and how should I take it?   What should I do if I need more help?   What is smoking withdrawal like? How can I get information on withdrawal?  Quitting takes hard work and a lot of effort, but you can quit smoking. FOR MORE INFORMATION  Smokefree.gov (http://www.smokefree.gov) provides free, accurate, evidence-based information and professional assistance to help support the immediate and long-term needs of people trying to quit smoking. Document Released: 06/17/2001 Document Revised: 03/05/2011 Document Reviewed: 04/09/2009 [employer] Patient Information 2012 [employer]. 

## 2011-09-05 ENCOUNTER — Other Ambulatory Visit (HOSPITAL_COMMUNITY): Payer: Self-pay | Admitting: Radiology

## 2011-09-09 ENCOUNTER — Ambulatory Visit: Payer: Self-pay | Admitting: Cardiovascular Disease

## 2011-09-10 ENCOUNTER — Other Ambulatory Visit (HOSPITAL_COMMUNITY): Payer: Self-pay | Admitting: Family Medicine

## 2011-09-10 DIAGNOSIS — Z1231 Encounter for screening mammogram for malignant neoplasm of breast: Secondary | ICD-10-CM

## 2011-09-11 ENCOUNTER — Other Ambulatory Visit (HOSPITAL_COMMUNITY): Payer: Self-pay

## 2011-09-15 ENCOUNTER — Ambulatory Visit (HOSPITAL_COMMUNITY): Attending: Cardiology

## 2011-09-15 ENCOUNTER — Other Ambulatory Visit (HOSPITAL_COMMUNITY): Payer: Self-pay | Admitting: Cardiovascular Disease

## 2011-09-15 ENCOUNTER — Other Ambulatory Visit: Payer: Self-pay

## 2011-09-15 DIAGNOSIS — I359 Nonrheumatic aortic valve disorder, unspecified: Secondary | ICD-10-CM

## 2011-09-15 DIAGNOSIS — I080 Rheumatic disorders of both mitral and aortic valves: Secondary | ICD-10-CM | POA: Insufficient documentation

## 2011-09-15 DIAGNOSIS — I079 Rheumatic tricuspid valve disease, unspecified: Secondary | ICD-10-CM | POA: Insufficient documentation

## 2011-09-15 DIAGNOSIS — F17200 Nicotine dependence, unspecified, uncomplicated: Secondary | ICD-10-CM | POA: Insufficient documentation

## 2011-09-15 DIAGNOSIS — I059 Rheumatic mitral valve disease, unspecified: Secondary | ICD-10-CM

## 2011-09-15 DIAGNOSIS — I340 Nonrheumatic mitral (valve) insufficiency: Secondary | ICD-10-CM

## 2011-09-18 ENCOUNTER — Ambulatory Visit (INDEPENDENT_AMBULATORY_CARE_PROVIDER_SITE_OTHER): Admitting: Obstetrics and Gynecology

## 2011-09-18 ENCOUNTER — Encounter: Payer: Self-pay | Admitting: Obstetrics and Gynecology

## 2011-09-18 VITALS — BP 126/80 | HR 103 | Temp 98.4°F | Ht 67.0 in | Wt 234.5 lb

## 2011-09-18 DIAGNOSIS — N939 Abnormal uterine and vaginal bleeding, unspecified: Secondary | ICD-10-CM

## 2011-09-18 DIAGNOSIS — N926 Irregular menstruation, unspecified: Secondary | ICD-10-CM

## 2011-09-18 NOTE — Progress Notes (Deleted)
  Subjective:    Patient ID: Stephanie Joseph, female    DOB: Jul 12, 1960, 51 y.o.   MRN: [id number]  HPI 51 yo [id number] with BMI 36 and h/o abnormal uterine bleeding presenting today to discuss the results of endometrial biopsy performed   Review of Systems     Objective:   Physical Exam        Assessment & Plan:

## 2011-09-18 NOTE — Progress Notes (Signed)
51 yo [id number] with BMI 36 and h/o abnormal uterine bleeding presenting today to discuss the results of endometrial biopsy performed on 09/01/2011. Patient reports a 2-3 month h/o heavy vaginal bleeding a few months ago medically managed with provera with good results. Patient is currently doing well and denies any further episodes of vaginal bleeding over the past month. Results of endometrial biopsy (weakly proliferative endometrium without hyperplasia or malignancy) reviewed, along with normal pap smear results explained to the patient. It was explained to the patient that she is in the perimenopausal phase and that irregular vaginal bleeding pattern are expected. Patient does not desire any interventions at this time and would like to see what becomes of her vaginal bleeding. If bleeding becomes problematic, patient is interested in endometrial biopsy.  Patient to return prn. Patient schedule for mammogram next week

## 2011-10-08 ENCOUNTER — Ambulatory Visit (HOSPITAL_COMMUNITY)
Admission: RE | Admit: 2011-10-08 | Discharge: 2011-10-08 | Disposition: A | Discharge status: Home / Self Care | Payer: Self-pay | Source: Ambulatory Visit | Attending: Family Medicine | Admitting: Family Medicine

## 2011-10-08 DIAGNOSIS — Z1231 Encounter for screening mammogram for malignant neoplasm of breast: Secondary | ICD-10-CM | POA: Insufficient documentation

## 2011-10-21 ENCOUNTER — Ambulatory Visit (INDEPENDENT_AMBULATORY_CARE_PROVIDER_SITE_OTHER): Admitting: Cardiovascular Disease

## 2011-10-21 ENCOUNTER — Encounter: Payer: Self-pay | Admitting: Cardiovascular Disease

## 2011-10-21 VITALS — BP 131/88 | HR 97 | Ht 67.0 in | Wt 223.0 lb

## 2011-10-21 DIAGNOSIS — I359 Nonrheumatic aortic valve disorder, unspecified: Secondary | ICD-10-CM

## 2011-10-21 DIAGNOSIS — I38 Endocarditis, valve unspecified: Secondary | ICD-10-CM | POA: Insufficient documentation

## 2011-10-21 DIAGNOSIS — I10 Essential (primary) hypertension: Secondary | ICD-10-CM

## 2011-10-21 NOTE — Progress Notes (Signed)
51 yo referred for murmur and abnormal echo. Reviewed echo read by Dr Lynnea Ferrier in September. He indicated severe MR with no frank prolapse or flail. LV size and funciton normal. 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 Denies appetite suppresant use, RF, SBE. 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  I reviewed the echo and thought the MR was more moderate and in the absence of symptoms or clearly repairable lesion there is no indicatoin for agressive w/u at this time Discussed this with patient including diagnosis of MR and warning signs of symptomatic progression. BP suboptimally controlled and will add Cozzaar to med list for further afterload reduction.   Echo 3/13/  Study Conclusions  - Left ventricle: The cavity size was normal. Wall thickness was increased in a pattern of mild LVH. Systolic function was normal. The estimated ejection fraction was in the range of 50% to 55%. Wall motion was normal; there were no regional wall motion abnormalities. Doppler parameters are consistent with abnormal left ventricular relaxation (grade 1 diastolic dysfunction). - Aortic valve: There was mild stenosis. Mild to moderate regurgitation. Mean gradient: 10mm Hg (S). Peak gradient: 20mm Hg (S). - Pulmonary arteries: PA peak pressure: 38mm Hg (S).    ROS: Denies fever, malais, weight loss, blurry vision, decreased visual acuity, cough, sputum, SOB, hemoptysis, pleuritic pain, palpitaitons, heartburn, abdominal pain, melena, lower extremity edema, claudication, or rash.  All other systems reviewed and negative  General: Affect appropriate Obese black female HEENT: normal Neck supple with no adenopathy JVP normal no bruits no thyromegaly Lungs clear with no wheezing and good diaphragmatic motion Heart:  S1/S2 SEM  murmur, no rub, gallop or click PMI normal Abdomen: benighn, BS positve, no tenderness, no AAA no bruit.  No HSM or  HJR Distal pulses intact with no bruits No edema Neuro non-focal Skin warm and dry No muscular weakness   Current Outpatient Prescriptions  Medication Sig Dispense Refill  . amLODipine (NORVASC) 10 MG tablet Take 10 mg by mouth daily.        Marland Kitchen buPROPion (WELLBUTRIN XL) 300 MG 24 hr tablet Take 150 mg by mouth 2 (two) times daily.       . hydrochlorothiazide 25 MG tablet Take 25 mg by mouth daily.        Marland Kitchen losartan (COZAAR) 25 MG tablet Take 25 mg by mouth daily.        . meloxicam (MOBIC) 15 MG tablet Take 15 mg by mouth daily.      . sertraline (ZOLOFT) 50 MG tablet Take 50 mg by mouth daily.          Allergies  Review of patient's allergies indicates no known allergies.  Electrocardiogram:  NSR rate 97  Nonspecific T wave changes  No change from 2012  Assessment and Plan

## 2011-10-21 NOTE — Assessment & Plan Note (Signed)
Continue current 3 drug Rx  Encouraged to loose weight and decrease sodium May have to add beta blocker in future

## 2011-10-21 NOTE — Patient Instructions (Signed)
Your physician wants you to follow-up in:  6 MONTHS WITH DR NISHAN  You will receive a reminder letter in the mail two months in advance. If you don't receive a letter, please call our office to schedule the follow-up appointment. Your physician recommends that you continue on your current medications as directed. Please refer to the Current Medication list given to you today. 

## 2011-10-21 NOTE — Assessment & Plan Note (Signed)
F/U echo in a year Asymptomatic  Will have to review as current echo only indicates trivial MR and this was her primary valvular disease 2 years ago

## 2012-02-09 ENCOUNTER — Encounter (HOSPITAL_COMMUNITY): Payer: Self-pay | Admitting: *Deleted

## 2012-02-09 ENCOUNTER — Emergency Department (INDEPENDENT_AMBULATORY_CARE_PROVIDER_SITE_OTHER)
Admission: EM | Admit: 2012-02-09 | Discharge: 2012-02-09 | Disposition: A | Discharge status: Home / Self Care | Source: Home / Self Care

## 2012-02-09 DIAGNOSIS — M722 Plantar fascial fibromatosis: Secondary | ICD-10-CM

## 2012-02-09 MED ORDER — LIDOCAINE HCL (PF) 1 % IJ SOLN
INTRAMUSCULAR | Status: AC
  Filled 2012-02-09: qty 5

## 2012-02-09 MED ORDER — METHYLPREDNISOLONE SODIUM SUCC 125 MG IJ SOLR: 40.0000 mg | Freq: Once | INTRAMUSCULAR | Status: DC

## 2012-02-09 MED ORDER — METHYLPREDNISOLONE ACETATE 40 MG/ML IJ SUSP
40.0000 mg | Freq: Once | INTRAMUSCULAR | Status: AC
  Administered 2012-02-09: 40 mg via INTRA_ARTICULAR

## 2012-02-09 MED ORDER — LIDOCAINE HCL (PF) 1 % IJ SOLN
2.0000 mL | Freq: Once | INTRAMUSCULAR | Status: AC
  Administered 2012-02-09: 2 mL

## 2012-02-09 MED ORDER — METHYLPREDNISOLONE ACETATE 40 MG/ML IJ SUSP
INTRAMUSCULAR | Status: AC
  Filled 2012-02-09: qty 5

## 2012-02-09 NOTE — ED Provider Notes (Signed)
History     CSN: [id number]  Arrival date & time 02/09/12  1220   First MD Initiated Contact with Patient 02/09/12 1303      Chief Complaint  Patient presents with  . Foot Pain    (Consider location/radiation/quality/duration/timing/severity/associated sxs/prior treatment) Patient is a 51 y.o. female presenting with lower extremity pain.  Foot Pain    Past Medical History  Diagnosis Date  . Hypertension   . Mitral regurgitation   . Depression   . Gout   . Anemia, iron deficiency   . Dysmenorrhea   . Menorrhagia   . Pneumonia     Past Surgical History  Procedure Date  . Knee arthroscopy     Left    Family History  Problem Relation Age of Onset  . Heart disease Sister   . Heart disease Brother     History  Substance Use Topics  . Smoking status: Current Everyday Smoker -- 0.5 packs/day    Types: Cigarettes  . Smokeless tobacco: Never Used  . Alcohol Use: No    OB History    Grav Para Term Preterm Abortions TAB SAB Ect Mult Living   3 1  1 2 1 1   1       Review of Systems  Allergies  Review of patient's allergies indicates no known allergies.  Home Medications   Current Outpatient Rx  Name Route Sig Dispense Refill  . AMLODIPINE BESYLATE 10 MG PO TABS Oral Take 10 mg by mouth daily.      . BUPROPION HCL ER (XL) 300 MG PO TB24 Oral Take 150 mg by mouth 2 (two) times daily.     Marland Kitchen HYDROCHLOROTHIAZIDE 25 MG PO TABS Oral Take 25 mg by mouth daily.      Marland Kitchen LOSARTAN POTASSIUM 25 MG PO TABS Oral Take 25 mg by mouth daily.      . MELOXICAM 15 MG PO TABS Oral Take 15 mg by mouth daily.    . SERTRALINE HCL 50 MG PO TABS Oral Take 50 mg by mouth daily.        BP 138/81  Pulse 87  Temp 98.3 F (36.8 C) (Oral)  Resp 16  SpO2 100%  LMP 01/03/2011  Physical Exam  ED Course  Injection tendon or ligament Date/Time: 02/09/2012 3:06 PM Performed by: Luiz Blare Authorized by: Rhetta Mura Consent: Verbal consent obtained. Risks and  benefits: risks, benefits and alternatives were discussed Consent given by: patient Patient understanding: patient states understanding of the procedure being performed Patient consent: the patient's understanding of the procedure matches consent given Procedure consent: procedure consent matches procedure scheduled Site marked: the operative site was marked Required items: required blood products, implants, devices, and special equipment available Patient identity confirmed: verbally with patient and provided demographic data Time out: Immediately prior to procedure a "time out" was called to verify the correct patient, procedure, equipment, support staff and site/side marked as required. Preparation: Patient was prepped and draped in the usual sterile fashion. Local anesthesia used: yes Local anesthetic: lidocaine 1% without epinephrine Anesthetic total: 2 ml Patient sedated: no Patient tolerance: Patient tolerated the procedure well with no immediate complications. Comments: Mixed 2mL lido 1% and 40 mg depomedrol. Injected plantar fascia. Sterile dressing applied.    (including critical care time)  Labs Reviewed - No data to display No results found.   1. Plantar fascia syndrome       MDM  See Dr. Pandora Leiter note. I performed the procedure myself with  the patient's consent with supervision by Dr. Mahala Menghini.    Luiz Blare, MD 02/09/12 [phone]

## 2012-02-09 NOTE — ED Provider Notes (Signed)
History     CSN: [id number]  Arrival date & time 02/09/12  1220   First MD Initiated Contact with Patient 02/09/12 1303      Chief Complaint  Patient presents with  . Foot Pain   51 yr old with know heart murmur, comes to [facility] with R foot pain in the heel for the past 2 weeks.  She states it "just started"-it appears to her to be swollen.  Has tkn some NSAIDS-Mobic didn't help at all.  States the pain is about 7/10.  Localized to the heel.  Not hot but swollen.  Not a diabetic, hasn't had any fevers or chills.  No accidents or falls onto the area, didn't twist her ankle States that she has had an injection like this before and it did help her  HPI  Past Medical History  Diagnosis Date  . Hypertension   . Mitral regurgitation   . Depression   . Gout   . Anemia, iron deficiency   . Dysmenorrhea   . Menorrhagia   . Pneumonia     Past Surgical History  Procedure Date  . Knee arthroscopy     Left    Family History  Problem Relation Age of Onset  . Heart disease Sister   . Heart disease Brother     History  Substance Use Topics  . Smoking status: Current Everyday Smoker -- 0.5 packs/day    Types: Cigarettes  . Smokeless tobacco: Never Used  . Alcohol Use: No    OB History    Grav Para Term Preterm Abortions TAB SAB Ect Mult Living   3 1  1 2 1 1   1       Review of Systems No chest pain or shortness of breath nausea no vomiting no blurred vision or double vision No cold cough fever States she does have pain in her right ankle but does not have any other joint pain Rest of review systems negative except as noted in history of present illness  Allergies  Review of patient's allergies indicates no known allergies.  Home Medications   Current Outpatient Rx  Name Route Sig Dispense Refill  . AMLODIPINE BESYLATE 10 MG PO TABS Oral Take 10 mg by mouth daily.      . BUPROPION HCL ER (XL) 300 MG PO TB24 Oral Take 150 mg by mouth 2 (two) times daily.     Marland Kitchen  HYDROCHLOROTHIAZIDE 25 MG PO TABS Oral Take 25 mg by mouth daily.      Marland Kitchen LOSARTAN POTASSIUM 25 MG PO TABS Oral Take 25 mg by mouth daily.      . MELOXICAM 15 MG PO TABS Oral Take 15 mg by mouth daily.    . SERTRALINE HCL 50 MG PO TABS Oral Take 50 mg by mouth daily.        BP 138/81  Pulse 87  Temp 98.3 F (36.8 C) (Oral)  Resp 16  SpO2 100%  LMP 01/03/2011  Physical Exam Pleasant alert obese African American female in no apparent distress Chest clear S1-S2 no murmur rub or gallop Morbidly obese Good dentition Tenderness over the right heel point tenderness one third way cephalad 2 the great toe.  Under informed consent with a 25-gauge 1/2 inch needle 40 mg of Solu-Medrol along with 2 mils of lidocaine was injected into the plantar fascia. Next is no need for medications and did well with regards to the same   ED Course  Injection of joint Date/Time: 02/09/2012  3:04 PM Performed by: Rhetta Mura Authorized by: Rhetta Mura Consent: Verbal consent obtained. Consent given by: patient Patient understanding: patient states understanding of the procedure being performed Patient consent: the patient's understanding of the procedure matches consent given Procedure consent: procedure consent matches procedure scheduled Relevant documents: relevant documents present and verified Test results: test results available and properly labeled Site marked: the operative site was marked Imaging studies: imaging studies not available Patient identity confirmed: verbally with patient Time out: Immediately prior to procedure a "time out" was called to verify the correct patient, procedure, equipment, support staff and site/side marked as required. Preparation: Patient was prepped and draped in the usual sterile fashion. Local anesthesia used: yes Local anesthetic: lidocaine 2% without epinephrine Anesthetic total: 2 ml Patient sedated: no Patient tolerance: Patient tolerated  the procedure well with no immediate complications.   (including critical care time)  Labs Reviewed - No data to display No results found.   No diagnosis found.    MDM  Patient tolerated plantar injection well and was given instructions to her night splint and do exercises.  Patient understood the same. She understands she needs to be present primary-care physician of her choice if she continues to have issues        Rhetta Mura, MD 02/09/12 1505

## 2012-02-09 NOTE — ED Notes (Signed)
Pt with c/o right heel pain onset x 2 weeks - worse when standing -  Has taken meloxicam without relief

## 2012-11-02 ENCOUNTER — Other Ambulatory Visit (HOSPITAL_COMMUNITY): Payer: Self-pay | Admitting: Internal Medicine

## 2012-11-02 DIAGNOSIS — Z1231 Encounter for screening mammogram for malignant neoplasm of breast: Secondary | ICD-10-CM

## 2012-11-05 ENCOUNTER — Ambulatory Visit (INDEPENDENT_AMBULATORY_CARE_PROVIDER_SITE_OTHER): Admitting: Cardiovascular Disease

## 2012-11-05 ENCOUNTER — Encounter: Payer: Self-pay | Admitting: Cardiovascular Disease

## 2012-11-05 VITALS — BP 112/76 | HR 71 | Ht 67.0 in | Wt 239.0 lb

## 2012-11-05 DIAGNOSIS — I059 Rheumatic mitral valve disease, unspecified: Secondary | ICD-10-CM

## 2012-11-05 DIAGNOSIS — I10 Essential (primary) hypertension: Secondary | ICD-10-CM

## 2012-11-05 DIAGNOSIS — I340 Nonrheumatic mitral (valve) insufficiency: Secondary | ICD-10-CM

## 2012-11-05 NOTE — Assessment & Plan Note (Signed)
Moderate by echo 3/13  Needs f/u echo Suspect there will continue to be no surgical indications

## 2012-11-05 NOTE — Patient Instructions (Signed)

## 2012-11-05 NOTE — Assessment & Plan Note (Signed)
Well controlled.  Continue current medications and low sodium Dash type diet.    

## 2012-11-05 NOTE — Progress Notes (Signed)
Patient ID: Stephanie Joseph, female   DOB: [birth year], 52 y.o.   MRN: [id number] 52 yo referred for murmur and abnormal echo. Reviewed echo read by Dr Lynnea Ferrier in September. He indicated severe MR with no frank prolapse or flail. LV size and funciton normal. 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 Denies appetite suppresant use, RF, SBE. 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  I reviewed the echo and thought the MR was more moderate and in the absence of symptoms or clearly repairable lesion there is no indicatoin for agressive w/u at this time Discussed this with patient including diagnosis of MR and warning signs of symptomatic progression. BP suboptimally controlled and will add Cozzaar to med list for further afterload reduction.  Echo 3/13/  Study Conclusions  - Left ventricle: The cavity size was normal. Wall thickness was increased in a pattern of mild LVH. Systolic function was normal. The estimated ejection fraction was in the range of 50% to 55%. Wall motion was normal; there were no regional wall motion abnormalities. Doppler parameters are consistent with abnormal left ventricular relaxation (grade 1 diastolic dysfunction). - Aortic valve: There was mild stenosis. Mild to moderate regurgitation. Mean gradient: 10mm Hg (S). Peak gradient: 20mm Hg (S). - Pulmonary arteries: PA peak pressure: 38mm Hg (S).  Daughter living with her She reports going to school No dyspnea palpitaitons or chest pain   ROS: Denies fever, malais, weight loss, blurry vision, decreased visual acuity, cough, sputum, SOB, hemoptysis, pleuritic pain, palpitaitons, heartburn, abdominal pain, melena, lower extremity edema, claudication, or rash.  All other systems reviewed and negative  General: Affect appropriate Healthy:  appears stated age HEENT: normal Neck supple with no adenopathy JVP normal no bruits no thyromegaly Lungs clear with no  wheezing and good diaphragmatic motion Heart:  S1/S2 soft MR murmur, no rub, gallop or click PMI normal Abdomen: benighn, BS positve, no tenderness, no AAA no bruit.  No HSM or HJR Distal pulses intact with no bruits No edema Neuro non-focal Skin warm and dry No muscular weakness   Current Outpatient Prescriptions  Medication Sig Dispense Refill  . amLODipine (NORVASC) 10 MG tablet Take 10 mg by mouth daily.        . hydrochlorothiazide 25 MG tablet Take 25 mg by mouth daily.        . hydrOXYzine (ATARAX/VISTARIL) 50 MG tablet Take 50 mg by mouth 3 (three) times daily as needed for itching.      . losartan (COZAAR) 25 MG tablet Take 25 mg by mouth daily.        . meloxicam (MOBIC) 15 MG tablet Take 15 mg by mouth daily.       No current facility-administered medications for this visit.    Allergies  Review of patient's allergies indicates no known allergies.  Electrocardiogram:  SR rate 71 normal ECG  Assessment and Plan

## 2012-11-10 ENCOUNTER — Ambulatory Visit (HOSPITAL_COMMUNITY)
Admission: RE | Admit: 2012-11-10 | Discharge: 2012-11-10 | Disposition: A | Discharge status: Home / Self Care | Source: Ambulatory Visit | Attending: Internal Medicine | Admitting: Internal Medicine

## 2012-11-10 DIAGNOSIS — Z1231 Encounter for screening mammogram for malignant neoplasm of breast: Secondary | ICD-10-CM | POA: Insufficient documentation

## 2012-11-22 ENCOUNTER — Other Ambulatory Visit (HOSPITAL_COMMUNITY)

## 2012-12-03 ENCOUNTER — Ambulatory Visit (HOSPITAL_COMMUNITY): Attending: Cardiovascular Disease | Admitting: Radiology

## 2012-12-03 DIAGNOSIS — I340 Nonrheumatic mitral (valve) insufficiency: Secondary | ICD-10-CM

## 2012-12-03 DIAGNOSIS — I359 Nonrheumatic aortic valve disorder, unspecified: Secondary | ICD-10-CM

## 2012-12-03 DIAGNOSIS — I080 Rheumatic disorders of both mitral and aortic valves: Secondary | ICD-10-CM | POA: Insufficient documentation

## 2012-12-03 DIAGNOSIS — I059 Rheumatic mitral valve disease, unspecified: Secondary | ICD-10-CM

## 2012-12-03 NOTE — Progress Notes (Signed)
Echocardiogram performed.  

## 2012-12-16 ENCOUNTER — Telehealth: Payer: Self-pay | Admitting: Cardiovascular Disease

## 2012-12-16 ENCOUNTER — Encounter: Payer: Self-pay | Admitting: *Deleted

## 2012-12-16 NOTE — Telephone Encounter (Signed)
New Problem:    Patient called in retuning your call.  Please call back at 1pm.

## 2012-12-21 NOTE — Telephone Encounter (Signed)
PT AWARE OF ECHO RESULTS,   ALSO PT AWARE TO DISREGARD RESULT LETTER THAT WAS MAILED AS RESULTS WERE GIVEN AT  THIS TIME .Stephanie Joseph

## 2013-04-25 ENCOUNTER — Encounter (HOSPITAL_COMMUNITY): Payer: Self-pay | Admitting: Emergency Medicine

## 2013-04-25 ENCOUNTER — Emergency Department (INDEPENDENT_AMBULATORY_CARE_PROVIDER_SITE_OTHER)
Admission: EM | Admit: 2013-04-25 | Discharge: 2013-04-25 | Disposition: A | Discharge status: Home / Self Care | Source: Home / Self Care | Attending: Family Medicine | Admitting: Family Medicine

## 2013-04-25 DIAGNOSIS — M629 Disorder of muscle, unspecified: Secondary | ICD-10-CM

## 2013-04-25 DIAGNOSIS — M7632 Iliotibial band syndrome, left leg: Secondary | ICD-10-CM

## 2013-04-25 MED ORDER — ETODOLAC 500 MG PO TABS: 500.0000 mg | ORAL_TABLET | Freq: Two times a day (BID) | ORAL | Status: DC | PRN

## 2013-04-25 NOTE — ED Provider Notes (Signed)
CSN: [id number]     Arrival date & time 04/25/13  1249 History   First MD Initiated Contact with Patient 04/25/13 1514     Chief Complaint  Patient presents with  . Hip Pain   (Consider location/radiation/quality/duration/timing/severity/associated sxs/prior Treatment) HPI Comments: 52 year old female presents for evaluation of hip pain. This pain has been on for 2 weeks and is located on the  side of her left hip and down the side of her left leg to the knee. She has seen her primary care doctor about this and is set up to see orthopedics but she states she does not have a an appointment until 2 weeks from now. She says this pain is exactly the same as it was when she went to her primary care doctor. She denies any injury. She is taking meloxicam for this which is not helping significantly.    Past Medical History  Diagnosis Date  . Hypertension   . Mitral regurgitation   . Depression   . Gout   . Anemia, iron deficiency   . Dysmenorrhea   . Menorrhagia   . Pneumonia    Past Surgical History  Procedure Laterality Date  . Knee arthroscopy      Left   Family History  Problem Relation Age of Onset  . Heart disease Sister   . Heart disease Brother    History  Substance Use Topics  . Smoking status: Current Every Day Smoker -- 0.50 packs/day    Types: Cigarettes  . Smokeless tobacco: Never Used  . Alcohol Use: No   OB History   Grav Para Term Preterm Abortions TAB SAB Ect Mult Living   3 1  1 2 1 1   1      Review of Systems  Constitutional: Negative for fever and chills.  Eyes: Negative for visual disturbance.  Respiratory: Negative for cough and shortness of breath.   Cardiovascular: Negative for chest pain, palpitations and leg swelling.  Gastrointestinal: Negative for nausea, vomiting and abdominal pain.  Endocrine: Negative for polydipsia and polyuria.  Genitourinary: Negative for dysuria, urgency and frequency.  Musculoskeletal: Negative for arthralgias and  myalgias.       See history of present illness  Skin: Negative for rash.  Neurological: Negative for dizziness, weakness and light-headedness.    Allergies  Review of patient's allergies indicates no known allergies.  Home Medications   Current Outpatient Rx  Name  Route  Sig  Dispense  Refill  . amLODipine (NORVASC) 10 MG tablet   Oral   Take 10 mg by mouth daily.           . hydrochlorothiazide 25 MG tablet   Oral   Take 25 mg by mouth daily.           . hydrOXYzine (ATARAX/VISTARIL) 50 MG tablet   Oral   Take 50 mg by mouth 3 (three) times daily as needed for itching.         . losartan (COZAAR) 25 MG tablet   Oral   Take 25 mg by mouth daily.           . meloxicam (MOBIC) 15 MG tablet   Oral   Take 15 mg by mouth daily.         Marland Kitchen etodolac (LODINE) 500 MG tablet   Oral   Take 1 tablet (500 mg total) by mouth 2 (two) times daily as needed.   30 tablet   0    BP 127/86  Pulse  73  Temp(Src) 98 F (36.7 C) (Oral)  Resp 12  SpO2 100%  LMP 10/13/2012 Physical Exam  Nursing note and vitals reviewed. Constitutional: She is oriented to person, place, and time. Vital signs are normal. She appears well-developed and well-nourished. No distress.  HENT:  Head: Normocephalic and atraumatic.  Pulmonary/Chest: Effort normal. No respiratory distress.  Musculoskeletal:       Right hip: Normal.       Left hip: She exhibits tenderness (Mild tenderness to palpation on the lateral thigh in the distribution of the IT band).       Lumbar back: Normal.  Neurological: She is alert and oriented to person, place, and time. She has normal strength. Coordination normal.  Skin: Skin is warm and dry. No rash noted. She is not diaphoretic.  Psychiatric: She has a normal mood and affect. Judgment normal.    ED Course  Procedures (including critical care time) Labs Review Labs Reviewed - No data to display Imaging Review No results found.    MDM   1. IT band  syndrome, left    This patient's presentation is consistent with IT band syndrome. I will prescribe stretching exercises and we can switch the meloxicam to total in the meantime. I spoke with her primary care physician's office, her appointment is actually one week from now, not 2 weeks from now.  Meds ordered this encounter  Medications  . etodolac (LODINE) 500 MG tablet    Sig: Take 1 tablet (500 mg total) by mouth 2 (two) times daily as needed.    Dispense:  30 tablet    Refill:  0    Order Specific Question:  Supervising Provider    Answer:  Lorenz Coaster, DAVID C [[id number]]     Graylon Good, PA-C 04/25/13 1547

## 2013-04-25 NOTE — ED Notes (Signed)
C/o left hip pain. States has fluid on hip. Pain and swelling.  Pain with walking. Onset 2 wks ago. No otc meds taken. States this has never happened before.

## 2013-04-27 NOTE — ED Provider Notes (Signed)
Medical screening examination/treatment/procedure(s) were performed by resident physician or non-physician practitioner and as supervising physician I was immediately available for consultation/collaboration.   Clista Rainford DOUGLAS MD.   Noa Constante D Hilaria Titsworth, MD 04/27/13 2101 

## 2013-09-07 LAB — HM DIABETES EYE EXAM

## 2013-09-25 LAB — HM DIABETES EYE EXAM

## 2013-11-10 ENCOUNTER — Encounter: Payer: Self-pay | Admitting: Cardiovascular Disease

## 2013-11-10 ENCOUNTER — Ambulatory Visit (INDEPENDENT_AMBULATORY_CARE_PROVIDER_SITE_OTHER): Admitting: Cardiovascular Disease

## 2013-11-10 VITALS — BP 124/78 | HR 67 | Ht 67.0 in | Wt 224.0 lb

## 2013-11-10 DIAGNOSIS — I359 Nonrheumatic aortic valve disorder, unspecified: Secondary | ICD-10-CM

## 2013-11-10 DIAGNOSIS — I340 Nonrheumatic mitral (valve) insufficiency: Secondary | ICD-10-CM

## 2013-11-10 DIAGNOSIS — I10 Essential (primary) hypertension: Secondary | ICD-10-CM

## 2013-11-10 DIAGNOSIS — I059 Rheumatic mitral valve disease, unspecified: Secondary | ICD-10-CM

## 2013-11-10 LAB — HM DIABETES EYE EXAM

## 2013-11-10 NOTE — Progress Notes (Signed)
Patient ID: Stephanie Joseph, female   DOB: 03-21-1961, 53 y.o.   MRN: [id number] 53 yo referred for murmur and abnormal echo. Reviewed echo read by Dr Lynnea FerrierSolomon in September. He indicated severe MR with no frank prolapse or flail. LV size and funciton normal. 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 Denies appetite suppresant use, RF, SBE. 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  I reviewed the echo and thought the MR was more moderate and in the absence of symptoms or clearly repairable lesion there is no indicatoin for agressive w/u at this time Discussed this with patient including diagnosis of MR and warning signs of symptomatic progression. BP suboptimally controlled and will add Cozzaar to med list for further afterload reduction.   Echo 5/14  Study Conclusions  - Left ventricle: The cavity size was normal. Wall thickness was increased in a pattern of mild LVH. Systolic function was normal. The estimated ejection fraction was in the range of 50% to 55%. Wall motion was normal; there were no regional wall motion abnormalities. - Aortic valve: Mild regurgitation. Mean gradient: 8mm Hg (S). Peak gradient: 14mm Hg (S).     ROS: Denies fever, malais, weight loss, blurry vision, decreased visual acuity, cough, sputum, SOB, hemoptysis, pleuritic pain, palpitaitons, heartburn, abdominal pain, melena, lower extremity edema, claudication, or rash.  All other systems reviewed and negative  General: Affect appropriate Healthy:  appears stated age HEENT: normal Neck supple with no adenopathy JVP normal no bruits no thyromegaly Lungs clear with no wheezing and good diaphragmatic motion Heart:  S1/S2 mild AS  murmur, no rub, gallop or click PMI normal Abdomen: benighn, BS positve, no tenderness, no AAA no bruit.  No HSM or HJR Distal pulses intact with no bruits No edema Neuro non-focal Skin warm and dry No muscular  weakness   Current Outpatient Prescriptions  Medication Sig Dispense Refill  . amLODipine (NORVASC) 10 MG tablet Take 10 mg by mouth daily.        Marland Kitchen. etodolac (LODINE) 500 MG tablet Take 1 tablet (500 mg total) by mouth 2 (two) times daily as needed.  30 tablet  0  . hydrochlorothiazide 25 MG tablet Take 25 mg by mouth daily.        . hydrOXYzine (ATARAX/VISTARIL) 50 MG tablet Take 50 mg by mouth 3 (three) times daily as needed for itching.      . losartan (COZAAR) 25 MG tablet Take 25 mg by mouth daily.        . meloxicam (MOBIC) 15 MG tablet Take 15 mg by mouth daily.       No current facility-administered medications for this visit.    Allergies  Review of patient's allergies indicates no known allergies.  Electrocardiogram:  SR rate 67 normal ECG   Assessment and Plan

## 2013-11-10 NOTE — Assessment & Plan Note (Signed)
Not audible on exam mild to moderate on previous echos  Stable Consider echo in a year

## 2013-11-10 NOTE — Assessment & Plan Note (Signed)
Well controlled.  Continue current medications and low sodium Dash type diet.    

## 2013-11-10 NOTE — Patient Instructions (Signed)
Your physician wants you to follow-up in: YEAR WITH DR NISHAN  You will receive a reminder letter in the mail two months in advance. If you don't receive a letter, please call our office to schedule the follow-up appointment.  Your physician recommends that you continue on your current medications as directed. Please refer to the Current Medication list given to you today. 

## 2013-11-10 NOTE — Assessment & Plan Note (Signed)
Mild by echo 2014  Soft murmur consider repeat echo next year

## 2013-12-22 ENCOUNTER — Inpatient Hospital Stay (HOSPITAL_COMMUNITY)
Admission: AD | Admit: 2013-12-22 | Discharge: 2013-12-22 | Disposition: A | Discharge status: Home / Self Care | Source: Ambulatory Visit | Attending: Obstetrics & Gynecology | Admitting: Obstetrics & Gynecology

## 2013-12-22 ENCOUNTER — Encounter (HOSPITAL_COMMUNITY): Payer: Self-pay | Admitting: *Deleted

## 2013-12-22 ENCOUNTER — Other Ambulatory Visit (HOSPITAL_COMMUNITY): Payer: Self-pay | Admitting: Internal Medicine

## 2013-12-22 ENCOUNTER — Inpatient Hospital Stay (HOSPITAL_COMMUNITY)

## 2013-12-22 DIAGNOSIS — N898 Other specified noninflammatory disorders of vagina: Secondary | ICD-10-CM | POA: Insufficient documentation

## 2013-12-22 DIAGNOSIS — E119 Type 2 diabetes mellitus without complications: Secondary | ICD-10-CM | POA: Insufficient documentation

## 2013-12-22 DIAGNOSIS — I10 Essential (primary) hypertension: Secondary | ICD-10-CM | POA: Insufficient documentation

## 2013-12-22 DIAGNOSIS — D500 Iron deficiency anemia secondary to blood loss (chronic): Secondary | ICD-10-CM | POA: Insufficient documentation

## 2013-12-22 DIAGNOSIS — F17200 Nicotine dependence, unspecified, uncomplicated: Secondary | ICD-10-CM | POA: Insufficient documentation

## 2013-12-22 DIAGNOSIS — N950 Postmenopausal bleeding: Secondary | ICD-10-CM | POA: Insufficient documentation

## 2013-12-22 DIAGNOSIS — Z1231 Encounter for screening mammogram for malignant neoplasm of breast: Secondary | ICD-10-CM

## 2013-12-22 HISTORY — DX: Type 2 diabetes mellitus without complications: E11.9

## 2013-12-22 LAB — POCT PREGNANCY, URINE: Preg Test, Ur: NEGATIVE

## 2013-12-22 LAB — CBC
HCT: 27.6 % — ABNORMAL LOW (ref 36.0–46.0)
Hemoglobin: 8.7 g/dL — ABNORMAL LOW (ref 12.0–15.0)
MCH: 26.1 pg (ref 26.0–34.0)
MCHC: 31.5 g/dL (ref 30.0–36.0)
MCV: 82.9 fL (ref 78.0–100.0)
Platelets: 313 10*3/uL (ref 150–400)
RBC: 3.33 MIL/uL — ABNORMAL LOW (ref 3.87–5.11)
RDW: 15.3 % (ref 11.5–15.5)
WBC: 4.5 10*3/uL (ref 4.0–10.5)

## 2013-12-22 MED ORDER — MEGESTROL ACETATE 40 MG PO TABS: 40.0000 mg | ORAL_TABLET | Freq: Two times a day (BID) | ORAL | Status: DC

## 2013-12-22 MED ORDER — FERROUS FUMARATE-FOLIC ACID 324-1 MG PO TABS: 1.0000 | ORAL_TABLET | Freq: Two times a day (BID) | ORAL | Status: DC

## 2013-12-22 NOTE — Discharge Instructions (Signed)

## 2013-12-22 NOTE — MAU Provider Note (Signed)
History     CSN: [id number]  Arrival date and time: 12/22/13 1049   First Provider Initiated Contact with Patient 12/22/13 1128      Chief Complaint  Patient presents with  . Vaginal Bleeding   HPI This is a 53 y.o. female who presents with c/o heavy vaginal bleeding. States went a year with no bleeding prior to this.  Has been bleeding for 2 months off and on. Followed by Dr Eden EmmsNishan for medical care  RN Note:  Pt reports she has been on her period for the past 2 months. Reports changing pad every 1.5-2 hr and having clots as well. reports feeling tired.       OB History   Grav Para Term Preterm Abortions TAB SAB Ect Mult Living   3 1  1 2 1 1   1       Past Medical History  Diagnosis Date  . Hypertension   . Mitral regurgitation   . Depression   . Gout   . Anemia, iron deficiency   . Dysmenorrhea   . Menorrhagia   . Pneumonia   . Diabetes mellitus without complication     Past Surgical History  Procedure Laterality Date  . Knee arthroscopy      Left    Family History  Problem Relation Age of Onset  . Heart disease Sister   . Heart disease Brother     History  Substance Use Topics  . Smoking status: Current Every Day Smoker -- 0.50 packs/day    Types: Cigarettes  . Smokeless tobacco: Never Used  . Alcohol Use: No    Allergies: No Known Allergies  Prescriptions prior to admission  Medication Sig Dispense Refill  . amLODipine (NORVASC) 10 MG tablet Take 10 mg by mouth daily.       . hydrochlorothiazide 25 MG tablet Take 25 mg by mouth daily.        Marland Kitchen. losartan (COZAAR) 25 MG tablet Take 25 mg by mouth daily.        . meloxicam (MOBIC) 15 MG tablet Take 15 mg by mouth daily.      . metFORMIN (GLUCOPHAGE) 500 MG tablet Take 500 mg by mouth 2 (two) times daily with a meal.      . rOPINIRole (REQUIP) 1 MG tablet Take 1 mg by mouth daily.      Marland Kitchen. venlafaxine (EFFEXOR) 75 MG tablet Take 75 mg by mouth 3 (three) times daily with meals.      Marland Kitchen. etodolac  (LODINE) 500 MG tablet Take 1 tablet (500 mg total) by mouth 2 (two) times daily as needed.  30 tablet  0  . hydrOXYzine (ATARAX/VISTARIL) 50 MG tablet Take 50 mg by mouth 3 (three) times daily as needed for itching.        Review of Systems  Constitutional: Negative for fever, chills and malaise/fatigue.  Gastrointestinal: Negative for nausea, vomiting, abdominal pain, diarrhea and constipation.  Genitourinary: Negative for dysuria.       Vaginal bleeding   Neurological: Negative for dizziness.   Physical Exam   Blood pressure 118/73, pulse 77, temperature 98 F (36.7 C), resp. rate 18, height 5\' 7"  (1.702 m), weight 100.88 kg (222 lb 6.4 oz), last menstrual period 10/26/2013.  Physical Exam  Constitutional: She is oriented to person, place, and time. She appears well-developed and well-nourished. No distress.  Cardiovascular: Normal rate.   Respiratory: Effort normal.  GI: Soft. She exhibits no distension. There is no tenderness. There is no  rebound and no guarding.  Genitourinary: Uterus normal. Vaginal discharge (moderate blood with clots) found.  Uterus normal, nontender   Musculoskeletal: Normal range of motion.  Neurological: She is alert and oriented to person, place, and time.  Skin: Skin is warm and dry.  Psychiatric: She has a normal mood and affect.    MAU Course  Procedures  MDM Results for orders placed during the hospital encounter of 12/22/13 (from the past 72 hour(s))  POCT PREGNANCY, URINE     Status: None   Collection Time    12/22/13 11:27 AM      Result Value Ref Range   Preg Test, Ur NEGATIVE  NEGATIVE   Comment:            THE SENSITIVITY OF THIS     METHODOLOGY IS >24 mIU/mL  CBC     Status: Abnormal   Collection Time    12/22/13 11:31 AM      Result Value Ref Range   WBC 4.5  4.0 - 10.5 K/uL   RBC 3.33 (*) 3.87 - 5.11 MIL/uL   Hemoglobin 8.7 (*) 12.0 - 15.0 g/dL   HCT 96.027.6 (*) 45.436.0 - 09.846.0 %   MCV 82.9  78.0 - 100.0 fL   MCH 26.1  26.0 -  34.0 pg   MCHC 31.5  30.0 - 36.0 g/dL   RDW 11.915.3  14.711.5 - 82.915.5 %   Platelets 313  150 - 400 K/uL     Assessment and Plan  A:  Postmenopausal bleeding       Anemia due to #1  P:  Discussed with Dr Debroah LoopArnold       Discharge home       Rx Megace low dose taper, 40mg  bid then daily       Rx Hemocyte F bid       Will schedule outpatient US to check endometrial thickness        Reschedule into GYN clinic for endometrial biopsy and plan of care  [facility],MARIE 12/22/2013, 12:14 PM

## 2013-12-22 NOTE — MAU Note (Signed)
Pt reports she has been on her period for the past 2 months. Reports changing pad every 1.5-2 hr and having clots as well. reports feeling tired.

## 2013-12-26 ENCOUNTER — Ambulatory Visit (HOSPITAL_COMMUNITY)
Admission: RE | Admit: 2013-12-26 | Discharge: 2013-12-26 | Disposition: A | Discharge status: Home / Self Care | Source: Ambulatory Visit | Attending: Internal Medicine | Admitting: Internal Medicine

## 2013-12-26 DIAGNOSIS — Z1231 Encounter for screening mammogram for malignant neoplasm of breast: Secondary | ICD-10-CM | POA: Insufficient documentation

## 2014-01-05 ENCOUNTER — Encounter: Admitting: Obstetrics and Gynecology

## 2014-01-05 ENCOUNTER — Encounter: Payer: Self-pay | Admitting: General Practice

## 2014-01-05 ENCOUNTER — Telehealth: Payer: Self-pay | Admitting: General Practice

## 2014-01-05 NOTE — Telephone Encounter (Signed)
Patient no showed for appt today. Called patient, no answer- left message that we are trying to reach you because it appears you missed your appt with us today, please contact our front office staff so we may get this appt rescheduled. Will send letter

## 2014-01-26 ENCOUNTER — Encounter (HOSPITAL_COMMUNITY): Payer: Self-pay | Admitting: Emergency Medicine

## 2014-01-26 ENCOUNTER — Emergency Department (INDEPENDENT_AMBULATORY_CARE_PROVIDER_SITE_OTHER)
Admission: EM | Admit: 2014-01-26 | Discharge: 2014-01-26 | Disposition: A | Discharge status: Home / Self Care | Payer: PRIVATE HEALTH INSURANCE | Source: Home / Self Care | Attending: Emergency Medicine | Admitting: Emergency Medicine

## 2014-01-26 DIAGNOSIS — M109 Gout, unspecified: Secondary | ICD-10-CM

## 2014-01-26 MED ORDER — INDOMETHACIN 25 MG PO CAPS: 25.0000 mg | ORAL_CAPSULE | Freq: Three times a day (TID) | ORAL | Status: DC | PRN

## 2014-01-26 NOTE — ED Notes (Signed)
C/o left toe pain for two months States tingling started recently Does have hx of gout

## 2014-01-26 NOTE — ED Provider Notes (Signed)
CSN: [id number]     Arrival date & time 01/26/14  0804 History   First MD Initiated Contact with Patient 01/26/14 [phone]     Chief Complaint  Patient presents with  . Toe Pain   (Consider location/radiation/quality/duration/timing/severity/associated sxs/prior Treatment) HPI Comments: No fever No injury No treatment at home PCP: [facility]  Patient is a 53 y.o. female presenting with toe pain. The history is provided by the patient.  Toe Pain This is a recurrent (reports several year history of gout) problem. The problem occurs constantly. The problem has not changed (Symptoms have waxed and waned over past 2 months) since onset.   Past Medical History  Diagnosis Date  . Hypertension   . Mitral regurgitation   . Depression   . Gout   . Anemia, iron deficiency   . Dysmenorrhea   . Menorrhagia   . Pneumonia   . Diabetes mellitus without complication    Past Surgical History  Procedure Laterality Date  . Knee arthroscopy      Left   Family History  Problem Relation Age of Onset  . Heart disease Sister   . Heart disease Brother    History  Substance Use Topics  . Smoking status: Current Every Day Smoker -- 0.50 packs/day    Types: Cigarettes  . Smokeless tobacco: Never Used  . Alcohol Use: No   OB History   Grav Para Term Preterm Abortions TAB SAB Ect Mult Living   3 1  1 2 1 1   1      Review of Systems  All other systems reviewed and are negative.   Allergies  Review of patient's allergies indicates no known allergies.  Home Medications   Prior to Admission medications   Medication Sig Start Date End Date Taking? Authorizing Provider  methocarbamol (ROBAXIN) 500 MG tablet Take 500 mg by mouth 4 (four) times daily.   Yes Historical Provider, MD  QUEtiapine (SEROQUEL) 50 MG tablet Take 50 mg by mouth at bedtime.   Yes Historical Provider, MD  amLODipine (NORVASC) 10 MG tablet Take 10 mg by mouth daily.     Historical Provider, MD  etodolac  (LODINE) 500 MG tablet Take 1 tablet (500 mg total) by mouth 2 (two) times daily as needed. 04/25/13   Graylon GoodZachary H Baker, PA-C  Ferrous Fumarate-Folic Acid 324-1 MG TABS Take 1 tablet by mouth 2 (two) times daily. 12/22/13   Aviva SignsMarie L Williams, CNM  hydrochlorothiazide 25 MG tablet Take 25 mg by mouth daily.      Historical Provider, MD  hydrOXYzine (ATARAX/VISTARIL) 50 MG tablet Take 50 mg by mouth 3 (three) times daily as needed for itching.    Historical Provider, MD  indomethacin (INDOCIN) 25 MG capsule Take 1 capsule (25 mg total) by mouth 3 (three) times daily as needed for mild pain or moderate pain. 01/26/14   Ardis RowanJennifer Lee Jamarcus Laduke, PA  losartan (COZAAR) 25 MG tablet Take 25 mg by mouth daily.      Historical Provider, MD  megestrol (MEGACE) 40 MG tablet Take 1 tablet (40 mg total) by mouth 2 (two) times daily. Take one tablets 2 times a day for 3 days, then one tablet daily 12/22/13   Aviva SignsMarie L Williams, CNM  meloxicam (MOBIC) 15 MG tablet Take 15 mg by mouth daily.    Historical Provider, MD  metFORMIN (GLUCOPHAGE) 500 MG tablet Take 500 mg by mouth 2 (two) times daily with a meal.    Historical Provider, MD  rOPINIRole (  REQUIP) 1 MG tablet Take 1 mg by mouth daily.    Historical Provider, MD  venlafaxine (EFFEXOR) 75 MG tablet Take 75 mg by mouth 3 (three) times daily with meals.    Historical Provider, MD   BP 127/82  Pulse 80  Temp(Src) 98.2 F (36.8 C) (Oral)  Resp 14  SpO2 98% Physical Exam  Nursing note and vitals reviewed. Constitutional: She is oriented to person, place, and time. She appears well-developed and well-nourished. No distress.  HENT:  Head: Normocephalic and atraumatic.  Eyes: Conjunctivae are normal. No scleral icterus.  Cardiovascular: Normal rate.   Pulmonary/Chest: Effort normal.  Musculoskeletal: Normal range of motion. She exhibits tenderness. She exhibits no edema.  +podagra at left great toe with intact cap refill, sensation and ROM  Neurological: She is alert  and oriented to person, place, and time.  Skin: Skin is warm and dry. There is erythema.  +mild podagra at base of left great toe  Psychiatric: She has a normal mood and affect. Her behavior is normal.    ED Course  Procedures (including critical care time) Labs Review Labs Reviewed - No data to display  Imaging Review No results found.   MDM   1. Podagra    Last creatinine 1.0. Will provide Rx for indocin and advise PCP follow up if no improvement.     Jess Barters [place], [place] 01/26/14 [phone]

## 2014-01-26 NOTE — Discharge Instructions (Signed)
Gout Gout is an inflammatory arthritis caused by a buildup of uric acid crystals in the joints. Uric acid is a chemical that is normally present in the blood. When the level of uric acid in the blood is too high it can form crystals that deposit in your joints and tissues. This causes joint redness, soreness, and swelling (inflammation). Repeat attacks are common. Over time, uric acid crystals can form into masses (tophi) near a joint, destroying bone and causing disfigurement. Gout is treatable and often preventable. CAUSES  The disease begins with elevated levels of uric acid in the blood. Uric acid is produced by your body when it breaks down a naturally found substance called purines. Certain foods you eat, such as meats and fish, contain high amounts of purines. Causes of an elevated uric acid level include:  Being passed down from parent to child (heredity).  Diseases that cause increased uric acid production (such as obesity, psoriasis, and certain cancers).  Excessive alcohol use.  Diet, especially diets rich in meat and seafood.  Medicines, including certain cancer-fighting medicines (chemotherapy), water pills (diuretics), and aspirin.  Chronic kidney disease. The kidneys are no longer able to remove uric acid well.  Problems with metabolism. Conditions strongly associated with gout include:  Obesity.  High blood pressure.  High cholesterol.  Diabetes. Not everyone with elevated uric acid levels gets gout. It is not understood why some people get gout and others do not. Surgery, joint injury, and eating too much of certain foods are some of the factors that can lead to gout attacks. SYMPTOMS   An attack of gout comes on quickly. It causes intense pain with redness, swelling, and warmth in a joint.  Fever can occur.  Often, only one joint is involved. Certain joints are more commonly involved:  Base of the big toe.  Knee.  Ankle.  Wrist.  Finger. Without  treatment, an attack usually goes away in a few days to weeks. Between attacks, you usually will not have symptoms, which is different from many other forms of arthritis. DIAGNOSIS  Your caregiver will suspect gout based on your symptoms and exam. In some cases, tests may be recommended. The tests may include:  Blood tests.  Urine tests.  X-rays.  Joint fluid exam. This exam requires a needle to remove fluid from the joint (arthrocentesis). Using a microscope, gout is confirmed when uric acid crystals are seen in the joint fluid. TREATMENT  There are two phases to gout treatment: treating the sudden onset (acute) attack and preventing attacks (prophylaxis).  Treatment of an Acute Attack.  Medicines are used. These include anti-inflammatory medicines or steroid medicines.  An injection of steroid medicine into the affected joint is sometimes necessary.  The painful joint is rested. Movement can worsen the arthritis.  You may use warm or cold treatments on painful joints, depending which works best for you.  Treatment to Prevent Attacks.  If you suffer from frequent gout attacks, your caregiver may advise preventive medicine. These medicines are started after the acute attack subsides. These medicines either help your kidneys eliminate uric acid from your body or decrease your uric acid production. You may need to stay on these medicines for a very long time.  The early phase of treatment with preventive medicine can be associated with an increase in acute gout attacks. For this reason, during the first few months of treatment, your caregiver may also advise you to take medicines usually used for acute gout treatment. Be sure you   understand your caregiver's directions. Your caregiver may make several adjustments to your medicine dose before these medicines are effective.  Discuss dietary treatment with your caregiver or dietitian. Alcohol and drinks high in sugar and fructose and foods  such as meat, poultry, and seafood can increase uric acid levels. Your caregiver or dietitian can advise you on drinks and foods that should be limited. HOME CARE INSTRUCTIONS   Do not take aspirin to relieve pain. This raises uric acid levels.  Only take over-the-counter or prescription medicines for pain, discomfort, or fever as directed by your caregiver.  Rest the joint as much as possible. When in bed, keep sheets and blankets off painful areas.  Keep the affected joint raised (elevated).  Apply warm or cold treatments to painful joints. Use of warm or cold treatments depends on which works best for you.  Use crutches if the painful joint is in your leg.  Drink enough fluids to keep your urine clear or pale yellow. This helps your body get rid of uric acid. Limit alcohol, sugary drinks, and fructose drinks.  Follow your dietary instructions. Pay careful attention to the amount of protein you eat. Your daily diet should emphasize fruits, vegetables, whole grains, and fat-free or low-fat milk products. Discuss the use of coffee, vitamin C, and cherries with your caregiver or dietitian. These may be helpful in lowering uric acid levels.  Maintain a healthy body weight. SEEK MEDICAL CARE IF:   You develop diarrhea, vomiting, or any side effects from medicines.  You do not feel better in 24 hours, or you are getting worse. SEEK IMMEDIATE MEDICAL CARE IF:   Your joint becomes suddenly more tender, and you have chills or a fever. MAKE SURE YOU:   Understand these instructions.  Will watch your condition.  Will get help right away if you are not doing well or get worse. Document Released: 06/20/2000 Document Revised: 11/07/2013 Document Reviewed: 02/04/2012 Exit[employer] Patient Information 2015 [employer]. This information is not intended to replace advice given to you by your health care provider. Make sure you discuss any questions you have with your health care  provider. Low-Purine Diet Purines are compounds that affect the level of uric acid in your body. A low-purine diet is a diet that is low in purines. Eating a low-purine diet can prevent the level of uric acid in your body from getting too high and causing gout or kidney stones or both. WHAT DO I NEED TO KNOW ABOUT THIS DIET?  Choose low-purine foods. Examples of low-purine foods are listed in the next section.  Drink plenty of fluids, especially water. Fluids can help remove uric acid from your body. Try to drink 8-16 cups (1.9-3.8 L) a day.  Limit foods high in fat, especially saturated fat, as fat makes it harder for the body to get rid of uric acid. Foods high in saturated fat include pizza, cheese, ice cream, whole milk, fried foods, and gravies. Choose foods that are lower in fat and lean sources of protein. Use olive oil when cooking as it contains healthy fats that are not high in saturated fat.  Limit alcohol. Alcohol interferes with the elimination of uric acid from your body. If you are having a gout attack, avoid all alcohol.  Keep in mind that different people's bodies react differently to different foods. You will probably learn over time which foods do or do not affect you. If you discover that a food tends to cause your gout to flare up,   avoid eating that food. You can more freely enjoy foods that do not cause problems. If you have any questions about a food item, talk to your dietitian or health care provider. WHICH FOODS ARE LOW, MODERATE, AND HIGH IN PURINES? The following is a list of foods that are low, moderate, and high in purines. You can eat any amount of the foods that are low in purines. You may be able to have small amounts of foods that are moderate in purines. Ask your health care provider how much of a food moderate in purines you can have. Avoid foods high in purines. Grains  Foods low in purines: Enriched white bread, pasta, rice, cake, cornbread, popcorn.  Foods  moderate in purines: Whole-grain breads and cereals, wheat germ, bran, oatmeal. Uncooked oatmeal. Dry wheat bran or wheat germ.  Foods high in purines: Pancakes, French toast, biscuits, muffins. Vegetables  Foods low in purines: All vegetables, except those that are moderate in purines.  Foods moderate in purines: Asparagus, cauliflower, spinach, mushrooms, green peas. Fruits  All fruits are low in purines. Meats and other Protein Foods  Foods low in purines: Eggs, nuts, peanut butter.  Foods moderate in purines: 80-90% lean beef, lamb, veal, pork, poultry, fish, eggs, peanut butter, nuts. Crab, lobster, oysters, and shrimp. Cooked dried beans, peas, and lentils.  Foods high in purines: Anchovies, sardines, herring, mussels, tuna, codfish, scallops, trout, and haddock. Bacon. Organ meats (such as liver or kidney). Tripe. Game meat. Goose. Sweetbreads. Dairy  All dairy foods are low in purines. Low-fat and fat-free dairy products are best because they are low in saturated fat. Beverages  Drinks low in purines: Water, carbonated beverages, tea, coffee, cocoa.  Drinks moderate in purines: Soft drinks and other drinks sweetened with high-fructose corn syrup. Juices. To find whether a food or drink is sweetened with high-fructose corn syrup, look at the ingredients list.  Drinks high in purines: Alcoholic beverages (such as beer). Condiments  Foods low in purines: Salt, herbs, olives, pickles, relishes, vinegar.  Foods moderate in purines: Butter, margarine, oils, mayonnaise. Fats and Oils  Foods low in purines: All types, except gravies and sauces made with meat.  Foods high in purines: Gravies and sauces made with meat. Other Foods  Foods low in purines: Sugars, sweets, gelatin. Cake. Soups made without meat.  Foods moderate in purines: Meat-based or fish-based soups, broths, or bouillons. Foods and drinks sweetened with high-fructose corn syrup.  Foods high in purines:  High-fat desserts (such as ice cream, cookies, cakes, pies, doughnuts, and chocolate). Contact your dietitian for more information on foods that are not listed here. Document Released: 10/18/2010 Document Revised: 06/28/2013 Document Reviewed: 05/30/2013 Exit[employer] Patient Information 2015 [employer]. This information is not intended to replace advice given to you by your health care provider. Make sure you discuss any questions you have with your health care provider.  

## 2014-01-26 NOTE — ED Provider Notes (Signed)
Medical screening examination/treatment/procedure(s) were performed by resident physician or non-physician practitioner and as supervising physician I was immediately available for consultation/collaboration.  Randal BubaErin Yisroel Mullendore, MD     Charm RingsErin J Aarian Griffie, MD 01/26/14 [phone]

## 2014-02-21 ENCOUNTER — Emergency Department (INDEPENDENT_AMBULATORY_CARE_PROVIDER_SITE_OTHER)
Admission: EM | Admit: 2014-02-21 | Discharge: 2014-02-21 | Disposition: A | Discharge status: Home / Self Care | Payer: PRIVATE HEALTH INSURANCE | Source: Home / Self Care | Attending: Emergency Medicine | Admitting: Emergency Medicine

## 2014-02-21 ENCOUNTER — Emergency Department (HOSPITAL_COMMUNITY): Payer: PRIVATE HEALTH INSURANCE

## 2014-02-21 ENCOUNTER — Encounter (HOSPITAL_COMMUNITY): Payer: Self-pay | Admitting: Emergency Medicine

## 2014-02-21 DIAGNOSIS — M545 Low back pain, unspecified: Secondary | ICD-10-CM

## 2014-02-21 MED ORDER — DICLOFENAC SODIUM 75 MG PO TBEC: 75.0000 mg | DELAYED_RELEASE_TABLET | Freq: Two times a day (BID) | ORAL | Status: DC | PRN

## 2014-02-21 MED ORDER — CYCLOBENZAPRINE HCL 10 MG PO TABS: 10.0000 mg | ORAL_TABLET | Freq: Three times a day (TID) | ORAL | Status: DC | PRN

## 2014-02-21 NOTE — ED Provider Notes (Signed)
CSN: [id number]     Arrival date & time 02/21/14  1305 History   First MD Initiated Contact with Patient 02/21/14 1459     Chief Complaint  Patient presents with  . Back Pain   (Consider location/radiation/quality/duration/timing/severity/associated sxs/prior Treatment) HPI She is a 53 year old woman here for evaluation of low back pain. It is located in the central low back. It started 4 days ago. She denies any radiation of the pain. There is no numbness, tingling, weakness in her lower extremities. No saddle anesthesia. No trouble with bowel or bladder incontinence. She has tried Robaxin and meloxicam without improvement. She's also tried heat and ice without improvement. She states she had the same pain back in January of this year. At [facility], [facility] and received what sounds like an epidural steroid injection. She states this helped for several months.  Past Medical History  Diagnosis Date  . Hypertension   . Mitral regurgitation   . Depression   . Gout   . Anemia, iron deficiency   . Dysmenorrhea   . Menorrhagia   . Pneumonia   . Diabetes mellitus without complication    Past Surgical History  Procedure Laterality Date  . Knee arthroscopy      Left   Family History  Problem Relation Age of Onset  . Heart disease Sister   . Heart disease Brother    History  Substance Use Topics  . Smoking status: Current Every Day Smoker -- 0.50 packs/day    Types: Cigarettes  . Smokeless tobacco: Never Used  . Alcohol Use: No   OB History   Grav Para Term Preterm Abortions TAB SAB Ect Mult Living   3 1  1 2 1 1   1      Review of Systems  Constitutional: Negative.   Musculoskeletal: Positive for back pain.  Neurological: Negative for weakness and numbness.    Allergies  Review of patient's allergies indicates no known allergies.  Home Medications   Prior to Admission medications   Medication Sig Start Date End Date Taking? Authorizing  Provider  amLODipine (NORVASC) 10 MG tablet Take 10 mg by mouth daily.    Yes Historical Provider, MD  hydrochlorothiazide 25 MG tablet Take 25 mg by mouth daily.     Yes Historical Provider, MD  losartan (COZAAR) 25 MG tablet Take 25 mg by mouth daily.     Yes Historical Provider, MD  megestrol (MEGACE) 40 MG tablet Take 1 tablet (40 mg total) by mouth 2 (two) times daily. Take one tablets 2 times a day for 3 days, then one tablet daily 12/22/13  Yes Aviva SignsMarie L Williams, CNM  metFORMIN (GLUCOPHAGE) 500 MG tablet Take 500 mg by mouth 2 (two) times daily with a meal.   Yes Historical Provider, MD  QUEtiapine (SEROQUEL) 50 MG tablet Take 50 mg by mouth at bedtime.   Yes Historical Provider, MD  rOPINIRole (REQUIP) 1 MG tablet Take 1 mg by mouth daily.   Yes Historical Provider, MD  venlafaxine (EFFEXOR) 75 MG tablet Take 75 mg by mouth 3 (three) times daily with meals.   Yes Historical Provider, MD  cyclobenzaprine (FLEXERIL) 10 MG tablet Take 1 tablet (10 mg total) by mouth 3 (three) times daily as needed for muscle spasms. 02/21/14   Charm RingsErin J Anye Brose, MD  diclofenac (VOLTAREN) 75 MG EC tablet Take 1 tablet (75 mg total) by mouth 2 (two) times daily as needed for moderate pain. 02/21/14   Denny PeonErin  Chip Boer, MD  etodolac (LODINE) 500 MG tablet Take 1 tablet (500 mg total) by mouth 2 (two) times daily as needed. 04/25/13   Graylon Good, PA-C  Ferrous Fumarate-Folic Acid 324-1 MG TABS Take 1 tablet by mouth 2 (two) times daily. 12/22/13   Aviva Signs, CNM  hydrOXYzine (ATARAX/VISTARIL) 50 MG tablet Take 50 mg by mouth 3 (three) times daily as needed for itching.    Historical Provider, MD   BP 134/79  Pulse 83  Temp(Src) 98.3 F (36.8 C) (Oral)  SpO2 100% Physical Exam  Constitutional: She is oriented to person, place, and time. She appears well-developed and well-nourished. She appears distressed (mild with exam).  Neck: Normal range of motion.  Cardiovascular: Normal rate.   Pulmonary/Chest: Effort  normal.  Musculoskeletal:  Back: no erythema or obvious deformity.  Tender over distal lumbar spine. No paraspinous spasm appreciated.  No tenderness over piriformis or SI joints.  Negative seated SLR bilaterally.  Neurological: She is alert and oriented to person, place, and time. She exhibits normal muscle tone.    ED Course  Procedures (including critical care time) Labs Review Labs Reviewed - No data to display  Imaging Review No results found.   MDM   1. Midline low back pain without sciatica    Initially planned to get lumbar films, however patient would like to defer at this time. No red flags on history or exam. We'll treat symptomatically with Tylenol, Flexeril, diclofenac. Recommended alternating ice and heat to the area. Reviewed warning signs as in after visit summary. Followup with primary care provider in 2 weeks. If symptoms worsen, she should go to the emergency room.    Charm Rings, MD 02/21/14 [phone]

## 2014-02-21 NOTE — ED Notes (Signed)
C/o 4 day duration of pain in lower back ; no known injury, denies saddle anesthesia

## 2014-02-21 NOTE — Discharge Instructions (Signed)
Take extra-strength tylenol 2 tablets 3 times a day. Stop the robaxin and meloxicam. Start flexeril 3 times a day as needed. Start diclofenac 75mg  twice a day as needed. Continue to alternate heat and ice to the area.  Follow up with your regular doctor in 2 weeks. If you experience worsening pain, weakness in your legs, or bowel/bladder incontinence, go to the emergency room.

## 2014-02-22 ENCOUNTER — Encounter: Payer: Self-pay | Admitting: Obstetrics & Gynecology

## 2014-02-22 ENCOUNTER — Ambulatory Visit (INDEPENDENT_AMBULATORY_CARE_PROVIDER_SITE_OTHER): Payer: PRIVATE HEALTH INSURANCE | Admitting: Obstetrics & Gynecology

## 2014-02-22 VITALS — BP 120/84 | HR 81 | Temp 98.0°F | Ht 67.0 in | Wt 214.6 lb

## 2014-02-22 DIAGNOSIS — N924 Excessive bleeding in the premenopausal period: Secondary | ICD-10-CM

## 2014-02-22 DIAGNOSIS — Z01812 Encounter for preprocedural laboratory examination: Secondary | ICD-10-CM

## 2014-02-22 LAB — POCT PREGNANCY, URINE: Preg Test, Ur: NEGATIVE

## 2014-02-22 MED ORDER — ESTRADIOL 1 MG PO TABS: 1.0000 mg | ORAL_TABLET | Freq: Every day | ORAL | Status: DC

## 2014-02-22 MED ORDER — MEDROXYPROGESTERONE ACETATE 10 MG PO TABS: 20.0000 mg | ORAL_TABLET | Freq: Every day | ORAL | Status: DC

## 2014-02-22 NOTE — Patient Instructions (Signed)
Dysfunctional Uterine Bleeding Normally, menstrual periods begin between ages 11 to 17 in young women. A normal menstrual cycle/period may begin every 23 days up to 35 days and lasts from 1 to 7 days. Around 12 to 14 days before your menstrual period starts, ovulation (ovary produces an egg) occurs. When counting the time between menstrual periods, count from the first day of bleeding of the previous period to the first day of bleeding of the next period. Dysfunctional (abnormal) uterine bleeding is bleeding that is different from a normal menstrual period. Your periods may come earlier or later than usual. They may be lighter, have blood clots or be heavier. You may have bleeding between periods, or you may skip one period or more. You may have bleeding after sexual intercourse, bleeding after menopause, or no menstrual period. CAUSES   Pregnancy (normal, miscarriage, tubal).  IUDs (intrauterine device, birth control).  Birth control pills.  Hormone treatment.  Menopause.  Infection of the cervix.  Blood clotting problems.  Infection of the inside lining of the uterus.  Endometriosis, inside lining of the uterus growing in the pelvis and other female organs.  Adhesions (scar tissue) inside the uterus.  Obesity or severe weight loss.  Uterine polyps inside the uterus.  Cancer of the vagina, cervix, or uterus.  Ovarian cysts or polycystic ovary syndrome.  Medical problems (diabetes, thyroid disease).  Uterine fibroids (noncancerous tumor).  Problems with your female hormones.  Endometrial hyperplasia, very thick lining and enlarged cells inside of the uterus.  Medicines that interfere with ovulation.  Radiation to the pelvis or abdomen.  Chemotherapy. DIAGNOSIS   Your doctor will discuss the history of your menstrual periods, medicines you are taking, changes in your weight, stress in your life, and any medical problems you may have.  Your doctor will do a physical  and pelvic examination.  Your doctor may want to perform certain tests to make a diagnosis, such as:  Pap test.  Blood tests.  Cultures for infection.  CT scan.  Ultrasound.  Hysteroscopy.  Laparoscopy.  MRI.  Hysterosalpingography.  D and C.  Endometrial biopsy. TREATMENT  Treatment will depend on the cause of the dysfunctional uterine bleeding (DUB). Treatment may include:  Observing your menstrual periods for a couple of months.  Prescribing medicines for medical problems, including:  Antibiotics.  Hormones.  Birth control pills.  Removing an IUD (intrauterine device, birth control).  Surgery:  D and C (scrape and remove tissue from inside the uterus).  Laparoscopy (examine inside the abdomen with a lighted tube).  Uterine ablation (destroy lining of the uterus with electrical current, laser, heat, or freezing).  Hysteroscopy (examine cervix and uterus with a lighted tube).  Hysterectomy (remove the uterus). HOME CARE INSTRUCTIONS   If medicines were prescribed, take exactly as directed. Do not change or switch medicines without consulting your caregiver.  Long term heavy bleeding may result in iron deficiency. Your caregiver may have prescribed iron pills. They help replace the iron that your body lost from heavy bleeding. Take exactly as directed.  Do not take aspirin or medicines that contain aspirin one week before or during your menstrual period. Aspirin may make the bleeding worse.  If you need to change your sanitary pad or tampon more than once every 2 hours, stay in bed with your feet elevated and a cold pack on your lower abdomen. Rest as much as possible, until the bleeding stops or slows down.  Eat well-balanced meals. Eat foods high in iron. Examples   are:  Leafy green vegetables.  Whole-grain breads and cereals.  Eggs.  Meat.  Liver.  Do not try to lose weight until the abnormal bleeding has stopped and your blood iron level is  back to normal. Do not lift more than ten pounds or do strenuous activities when you are bleeding.  For a couple of months, make note on your calendar, marking the start and ending of your period, and the type of bleeding (light, medium, heavy, spotting, clots or missed periods). This is for your caregiver to better evaluate your problem. SEEK MEDICAL CARE IF:   You develop nausea (feeling sick to your stomach) and vomiting, dizziness, or diarrhea while you are taking your medicine.  You are getting lightheaded or weak.  You have any problems that may be related to the medicine you are taking.  You develop pain with your DUB.  You want to remove your IUD.  You want to stop or change your birth control pills or hormones.  You have any type of abnormal bleeding mentioned above.  You are over 16 years old and have not had a menstrual period yet.  You are 53 years old and you are still having menstrual periods.  You have any of the symptoms mentioned above.  You develop a rash. SEEK IMMEDIATE MEDICAL CARE IF:   An oral temperature above 102 F (38.9 C) develops.  You develop chills.  You are changing your sanitary pad or tampon more than once an hour.  You develop abdominal pain.  You pass out or faint. Document Released: 06/20/2000 Document Revised: 09/15/2011 Document Reviewed: 05/22/2009 Exit[employer] Patient Information 2015 [employer]. This information is not intended to replace advice given to you by your health care provider. Make sure you discuss any questions you have with your health care provider.  

## 2014-02-22 NOTE — Progress Notes (Signed)
Patient ID: Stephanie Joseph, female   DOB: January 02, 1961, 53 y.o.   MRN: [id number]  Chief Complaint  Patient presents with  . DUB     HPI Stephanie Humblengela Reichel is a 53 y.o. female.  [id number] No LMP recorded. H/O DUB, frequent irregular vaginal bleeding, may use 3 pad a day, no pain. Normal biopsy of uterus 08/2011, nl pap, nl US  HPI  Past Medical History  Diagnosis Date  . Hypertension   . Mitral regurgitation   . Depression   . Gout   . Anemia, iron deficiency   . Dysmenorrhea   . Menorrhagia   . Pneumonia   . Diabetes mellitus without complication     Past Surgical History  Procedure Laterality Date  . Knee arthroscopy      Left    Family History  Problem Relation Age of Onset  . Heart disease Sister   . Heart disease Brother     Social History History  Substance Use Topics  . Smoking status: Current Every Day Smoker -- 0.50 packs/day    Types: Cigarettes  . Smokeless tobacco: Never Used  . Alcohol Use: No    No Known Allergies  Current Outpatient Prescriptions  Medication Sig Dispense Refill  . amLODipine (NORVASC) 10 MG tablet Take 10 mg by mouth daily.       . Ferrous Fumarate-Folic Acid 324-1 MG TABS Take 1 tablet by mouth 2 (two) times daily.  60 each  1  . hydrOXYzine (ATARAX/VISTARIL) 50 MG tablet Take 50 mg by mouth 3 (three) times daily as needed for itching.      . losartan (COZAAR) 25 MG tablet Take 25 mg by mouth daily.        . metFORMIN (GLUCOPHAGE) 500 MG tablet Take 500 mg by mouth 2 (two) times daily with a meal.      . QUEtiapine (SEROQUEL) 50 MG tablet Take 50 mg by mouth at bedtime.      Marland Kitchen. rOPINIRole (REQUIP) 1 MG tablet Take 1 mg by mouth daily.      Marland Kitchen. venlafaxine (EFFEXOR) 75 MG tablet Take 75 mg by mouth 3 (three) times daily with meals.      . cyclobenzaprine (FLEXERIL) 10 MG tablet Take 1 tablet (10 mg total) by mouth 3 (three) times daily as needed for muscle spasms.  30 tablet  0  . diclofenac (VOLTAREN) 75 MG EC tablet Take 1 tablet (75  mg total) by mouth 2 (two) times daily as needed for moderate pain.  30 tablet  0  . estradiol (ESTRACE) 1 MG tablet Take 1 tablet (1 mg total) by mouth daily.  30 tablet  2  . etodolac (LODINE) 500 MG tablet Take 1 tablet (500 mg total) by mouth 2 (two) times daily as needed.  30 tablet  0  . hydrochlorothiazide 25 MG tablet Take 25 mg by mouth daily.        . medroxyPROGESTERone (PROVERA) 10 MG tablet Take 2 tablets (20 mg total) by mouth daily.  30 tablet  2  . megestrol (MEGACE) 40 MG tablet Take 1 tablet (40 mg total) by mouth 2 (two) times daily. Take one tablets 2 times a day for 3 days, then one tablet daily  90 tablet  3   No current facility-administered medications for this visit.    Review of Systems Review of Systems  Constitutional: Negative.   Respiratory: Negative.   Genitourinary: Positive for vaginal bleeding and menstrual problem. Negative for vaginal discharge and pelvic pain.  Blood pressure 120/84, pulse 81, temperature 98 F (36.7 C), temperature source Oral, height 5\' 7"  (1.702 m), weight 214 lb 9.6 oz (97.342 kg).  Physical Exam Physical Exam  Vitals reviewed. Constitutional: She is oriented to person, place, and time. She appears well-developed. She appears distressed.  Pulmonary/Chest: Effort normal. No respiratory distress.  Abdominal: Soft. There is no tenderness.  Genitourinary: Uterus normal. Vaginal discharge (light bleeding, vaginal tissue tag posterior fornix) found.  Neurological: She is alert and oriented to person, place, and time.  Skin: Skin is warm and dry. No pallor.  Psychiatric: She has a normal mood and affect. Her behavior is normal.    Data Reviewed [place], pap  And Bx  Assessment    DUB perimenopause previous nl biopsy     Plan    MPA 20 mg /day plus estrace 1 mg f/u in 6 weeks        Floria Brandau 02/22/2014, 4:08 PM

## 2014-04-03 ENCOUNTER — Ambulatory Visit: Payer: Self-pay | Admitting: Obstetrics and Gynecology

## 2014-05-08 ENCOUNTER — Encounter: Payer: Self-pay | Admitting: Obstetrics and Gynecology

## 2014-05-08 ENCOUNTER — Ambulatory Visit (INDEPENDENT_AMBULATORY_CARE_PROVIDER_SITE_OTHER): Payer: PRIVATE HEALTH INSURANCE | Admitting: Obstetrics and Gynecology

## 2014-05-08 VITALS — BP 125/74 | HR 75 | Temp 97.9°F | Ht 67.0 in | Wt 222.4 lb

## 2014-05-08 DIAGNOSIS — N924 Excessive bleeding in the premenopausal period: Secondary | ICD-10-CM

## 2014-05-08 MED ORDER — FERROUS FUMARATE-FOLIC ACID 324-1 MG PO TABS: 1.0000 | ORAL_TABLET | Freq: Two times a day (BID) | ORAL | Status: DC

## 2014-05-08 MED ORDER — ESTRADIOL 1 MG PO TABS: 1.0000 mg | ORAL_TABLET | Freq: Every day | ORAL | Status: DC

## 2014-05-08 MED ORDER — MEDROXYPROGESTERONE ACETATE 10 MG PO TABS: 20.0000 mg | ORAL_TABLET | Freq: Every day | ORAL | Status: DC

## 2014-05-08 NOTE — Progress Notes (Signed)
Patient ID: Stephanie Joseph, female   DOB: Dec 05, 1960, 53 y.o.   MRN: [id number] 53 yo [id number] presenting today for follow up on her perimenopausal vaginal bleeding. Patient was prescribed MPA 20 mg/d with estrace. She reports no menses since mid-August.   Past Medical History  Diagnosis Date  . Hypertension   . Mitral regurgitation   . Depression   . Gout   . Anemia, iron deficiency   . Dysmenorrhea   . Menorrhagia   . Pneumonia   . Diabetes mellitus without complication    Past Surgical History  Procedure Laterality Date  . Knee arthroscopy      Left   Family History  Problem Relation Age of Onset  . Heart disease Sister   . Heart disease Brother    History  Substance Use Topics  . Smoking status: Current Every Day Smoker -- 0.50 packs/day    Types: Cigarettes  . Smokeless tobacco: Never Used  . Alcohol Use: No    A/P 53 yo here for follow up on perimenopausal vaginal bleeding - Patient has been amenorrheic for 2 months - Patient desires to continue on these medications and will stop in the spring to see if her cycle will return - Advised to keep track of a menstrual calendar - RTC prn

## 2014-06-07 ENCOUNTER — Encounter: Payer: Self-pay | Admitting: Cardiovascular Disease

## 2014-06-07 ENCOUNTER — Ambulatory Visit (INDEPENDENT_AMBULATORY_CARE_PROVIDER_SITE_OTHER)
Admission: RE | Admit: 2014-06-07 | Discharge: 2014-06-07 | Disposition: A | Discharge status: Home / Self Care | Payer: PRIVATE HEALTH INSURANCE | Source: Ambulatory Visit | Attending: Cardiovascular Disease | Admitting: Cardiovascular Disease

## 2014-06-07 ENCOUNTER — Ambulatory Visit (INDEPENDENT_AMBULATORY_CARE_PROVIDER_SITE_OTHER): Payer: PRIVATE HEALTH INSURANCE | Admitting: Cardiovascular Disease

## 2014-06-07 VITALS — BP 127/88 | HR 77 | Ht 67.0 in | Wt 221.1 lb

## 2014-06-07 DIAGNOSIS — I10 Essential (primary) hypertension: Secondary | ICD-10-CM

## 2014-06-07 DIAGNOSIS — Z720 Tobacco use: Secondary | ICD-10-CM

## 2014-06-07 DIAGNOSIS — F17200 Nicotine dependence, unspecified, uncomplicated: Secondary | ICD-10-CM

## 2014-06-07 DIAGNOSIS — I359 Nonrheumatic aortic valve disorder, unspecified: Secondary | ICD-10-CM

## 2014-06-07 NOTE — Assessment & Plan Note (Signed)
Counseled for less than 10 minutes  Not motivated to quit  CXR today

## 2014-06-07 NOTE — Assessment & Plan Note (Signed)
NO need for echo this year mild AS/AR no symptoms  Consider f/u echo next year ? Related to previous drug/ appetite suppressant use

## 2014-06-07 NOTE — Assessment & Plan Note (Signed)
Well controlled.  Continue current medications and low sodium Dash type diet.    

## 2014-06-07 NOTE — Patient Instructions (Signed)
Your physician wants you to follow-up in: YEAR WITH DR Eden EmmsNISHAN AND  ECHO SAME DAY  You will receive a reminder letter in the mail two months in advance. If you don't receive a letter, please call our office to schedule the follow-up appointment. Your physician recommends that you continue on your current medications as directed. Please refer to the Current Medication list given to you today.  A chest x-ray takes a picture of the organs and structures inside the chest, including the heart, lungs, and blood vessels. This test can show several things, including, whether the heart is enlarges; whether fluid is building up in the lungs; and whether pacemaker / defibrillator leads are still in place. Your physician has requested that you have an echocardiogram. Echocardiography is a painless test that uses sound waves to create images of your heart. It provides your doctor with information about the size and shape of your heart and how well your heart's chambers and valves are working. This procedure takes approximately one hour. There are no restrictions for this procedure. YEAR  SEE DR  Eden EmmsNISHAN SAME  DAY

## 2014-06-07 NOTE — Progress Notes (Signed)
Patient ID: Stephanie Joseph, female   DOB: 09-Oct-1960, 53 y.o.   MRN: [id number] 53 yo referred for murmur and abnormal echo. Reviewed echo read by Dr Lynnea FerrierSolomon in September. He indicated severe MR with no frank prolapse or flail. LV size and funciton normal. 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 Denies appetite suppresant use, RF, SBE. 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  I reviewed the echo and thought the MR was more moderate and in the absence of symptoms or clearly repairable lesion there is no indicatoin for agressive w/u at this time Discussed this with patient including diagnosis of MR and warning signs of symptomatic progression. BP suboptimally controlled and will add Cozzaar to med list for further afterload reduction.   Echo 5/14  Study Conclusions  - Left ventricle: The cavity size was normal. Wall thickness was increased in a pattern of mild LVH. Systolic function was normal. The estimated ejection fraction was in the range of 50% to 55%. Wall motion was normal; there were no regional wall motion abnormalities. - Aortic valve: Mild regurgitation. Mean gradient: 8mm Hg (S). Peak gradient: 14mm Hg (S). Trivial MR     ROS: Denies fever, malais, weight loss, blurry vision, decreased visual acuity, cough, sputum, SOB, hemoptysis, pleuritic pain, palpitaitons, heartburn, abdominal pain, melena, lower extremity edema, claudication, or rash.  All other systems reviewed and negative  General: Affect appropriate Healthy:  appears stated age HEENT: normal Neck supple with no adenopathy JVP normal no bruits no thyromegaly Lungs clear with no wheezing and good diaphragmatic motion Heart:  S1/S2 mild AS/AR murmur, no rub, gallop or click PMI normal Abdomen: benighn, BS positve, no tenderness, no AAA no bruit.  No HSM or HJR Distal pulses intact with no bruits No edema Neuro non-focal Skin warm and dry No  muscular weakness   Current Outpatient Prescriptions  Medication Sig Dispense Refill  . amLODipine (NORVASC) 10 MG tablet Take 10 mg by mouth daily.     . cyclobenzaprine (FLEXERIL) 10 MG tablet Take 1 tablet (10 mg total) by mouth 3 (three) times daily as needed for muscle spasms. 30 tablet 0  . diclofenac (VOLTAREN) 75 MG EC tablet Take 1 tablet (75 mg total) by mouth 2 (two) times daily as needed for moderate pain. 30 tablet 0  . estradiol (ESTRACE) 1 MG tablet Take 1 tablet (1 mg total) by mouth daily. 30 tablet 2  . etodolac (LODINE) 500 MG tablet Take 1 tablet (500 mg total) by mouth 2 (two) times daily as needed. 30 tablet 0  . Ferrous Fumarate-Folic Acid 324-1 MG TABS Take 1 tablet by mouth 2 (two) times daily. 60 each 1  . hydrochlorothiazide 25 MG tablet Take 25 mg by mouth daily.      . hydrOXYzine (ATARAX/VISTARIL) 50 MG tablet Take 50 mg by mouth 3 (three) times daily as needed for itching.    . losartan (COZAAR) 25 MG tablet Take 25 mg by mouth daily.      . medroxyPROGESTERone (PROVERA) 10 MG tablet Take 2 tablets (20 mg total) by mouth daily. 30 tablet 2  . metFORMIN (GLUCOPHAGE) 500 MG tablet Take 500 mg by mouth 2 (two) times daily with a meal.    . QUEtiapine (SEROQUEL) 50 MG tablet Take 50 mg by mouth at bedtime.    Marland Kitchen. rOPINIRole (REQUIP) 1 MG tablet Take 1 mg by mouth daily.    Marland Kitchen. venlafaxine (EFFEXOR) 75  MG tablet Take 75 mg by mouth 3 (three) times daily with meals.     No current facility-administered medications for this visit.    Allergies  Review of patient's allergies indicates no known allergies.  Electrocardiogram:  5/15  SR rate 67 normal   Assessment and Plan

## 2014-06-20 LAB — HM DIABETES EYE EXAM

## 2014-07-01 ENCOUNTER — Other Ambulatory Visit: Payer: Self-pay | Admitting: Obstetrics and Gynecology

## 2014-07-03 ENCOUNTER — Other Ambulatory Visit: Payer: Self-pay | Admitting: Obstetrics and Gynecology

## 2014-07-03 MED ORDER — MEDROXYPROGESTERONE ACETATE 10 MG PO TABS: 20.0000 mg | ORAL_TABLET | Freq: Every day | ORAL | Status: DC

## 2014-09-08 ENCOUNTER — Ambulatory Visit: Payer: PRIVATE HEALTH INSURANCE | Admitting: Obstetrics and Gynecology

## 2014-09-21 LAB — HM DIABETES EYE EXAM

## 2014-10-04 ENCOUNTER — Encounter: Payer: Self-pay | Admitting: Obstetrics & Gynecology

## 2014-10-04 ENCOUNTER — Ambulatory Visit (INDEPENDENT_AMBULATORY_CARE_PROVIDER_SITE_OTHER): Payer: PRIVATE HEALTH INSURANCE | Admitting: Obstetrics & Gynecology

## 2014-10-04 VITALS — BP 134/86 | HR 82 | Temp 98.0°F | Wt 225.0 lb

## 2014-10-04 DIAGNOSIS — Z1151 Encounter for screening for human papillomavirus (HPV): Secondary | ICD-10-CM | POA: Diagnosis not present

## 2014-10-04 DIAGNOSIS — Z124 Encounter for screening for malignant neoplasm of cervix: Secondary | ICD-10-CM | POA: Diagnosis not present

## 2014-10-04 DIAGNOSIS — Z01419 Encounter for gynecological examination (general) (routine) without abnormal findings: Secondary | ICD-10-CM

## 2014-10-04 DIAGNOSIS — N924 Excessive bleeding in the premenopausal period: Secondary | ICD-10-CM

## 2014-10-04 DIAGNOSIS — Z79890 Hormone replacement therapy (postmenopausal): Secondary | ICD-10-CM | POA: Diagnosis not present

## 2014-10-04 MED ORDER — MEDROXYPROGESTERONE ACETATE 5 MG PO TABS: 5.0000 mg | ORAL_TABLET | Freq: Every day | ORAL | Status: DC

## 2014-10-04 MED ORDER — ESTRADIOL 1 MG PO TABS: 1.0000 mg | ORAL_TABLET | Freq: Every day | ORAL | Status: DC

## 2014-10-04 NOTE — Progress Notes (Signed)
Patient ID: Stephanie Joseph Labonte, female   DOB: [birth year], 54 y.o.   MRN: [id number]  Chief Complaint  Patient presents with  . Gynecologic Exam    HPI Stephanie Joseph Wangerin is a 54 y.o. female.  [id number] Postmenopausal HRT, no bleeding.   HPI  Past Medical History  Diagnosis Date  . Hypertension   . Mitral regurgitation   . Depression   . Gout   . Anemia, iron deficiency   . Dysmenorrhea   . Menorrhagia   . Pneumonia   . Diabetes mellitus without complication     Past Surgical History  Procedure Laterality Date  . Knee arthroscopy      Left    Family History  Problem Relation Age of Onset  . Heart disease Sister   . Heart disease Brother     Social History History  Substance Use Topics  . Smoking status: Current Every Day Smoker -- 0.50 packs/day    Types: Cigarettes  . Smokeless tobacco: Never Used  . Alcohol Use: No    No Known Allergies  Current Outpatient Prescriptions  Medication Sig Dispense Refill  . amLODipine (NORVASC) 10 MG tablet Take 10 mg by mouth daily.     . cyclobenzaprine (FLEXERIL) 10 MG tablet Take 1 tablet (10 mg total) by mouth 3 (three) times daily as needed for muscle spasms. 30 tablet 0  . estradiol (ESTRACE) 1 MG tablet Take 1 tablet (1 mg total) by mouth daily. 30 tablet 12  . etodolac (LODINE) 500 MG tablet Take 1 tablet (500 mg total) by mouth 2 (two) times daily as needed. 30 tablet 0  . hydrochlorothiazide 25 MG tablet Take 25 mg by mouth daily.      . hydrOXYzine (ATARAX/VISTARIL) 50 MG tablet Take 50 mg by mouth 3 (three) times daily as needed for itching.    . losartan (COZAAR) 25 MG tablet Take 25 mg by mouth daily.      . metFORMIN (GLUCOPHAGE) 500 MG tablet Take 500 mg by mouth 2 (two) times daily with a meal.    . QUEtiapine (SEROQUEL) 50 MG tablet Take 50 mg by mouth at bedtime.    Marland Kitchen. venlafaxine (EFFEXOR) 75 MG tablet Take 75 mg by mouth 3 (three) times daily with meals.    . diclofenac (VOLTAREN) 75 MG EC tablet Take 1 tablet (75  mg total) by mouth 2 (two) times daily as needed for moderate pain. (Patient not taking: Reported on 10/04/2014) 30 tablet 0  . medroxyPROGESTERone (PROVERA) 5 MG tablet Take 1 tablet (5 mg total) by mouth daily. 30 tablet 12  . rOPINIRole (REQUIP) 1 MG tablet Take 1 mg by mouth daily.     No current facility-administered medications for this visit.    Review of Systems  Review of Systems  Constitutional: Negative.   Respiratory: Negative.   Genitourinary: Positive for frequency. Negative for dysuria, vaginal bleeding and vaginal discharge.    Blood pressure 134/86, pulse 82, temperature 98 F (36.7 C), weight 102.059 kg (225 lb).  Physical Exam Physical Exam  Constitutional: She is oriented to person, place, and time. She appears well-developed. No distress.  Pulmonary/Chest:  Breasts: breasts appear normal, no suspicious masses, no skin or nipple changes or axillary nodes.   Abdominal: Soft. There is no tenderness.  Genitourinary: Vagina normal and uterus normal. No vaginal discharge found.  Pelvic exam: normal external genitalia, vulva, vagina, cervix, uterus and adnexa.   Musculoskeletal: Normal range of motion.  Neurological: She is alert and oriented to person,  place, and time.  Skin: Skin is warm and dry.  Psychiatric: She has a normal mood and affect. Her behavior is normal.    Data Reviewed Pap and mammogram  Assessment    Doing well with HRT postmenopausal     Plan    Provera 5 mg daily, estrace 1 mg daily, mammogram, check pap result        ARNOLD,JAMES 10/04/2014, 1:15 PM

## 2014-10-04 NOTE — Patient Instructions (Signed)

## 2014-10-05 LAB — CYTOLOGY - PAP

## 2014-12-29 ENCOUNTER — Other Ambulatory Visit (HOSPITAL_COMMUNITY): Payer: Self-pay | Admitting: Family Medicine

## 2014-12-29 DIAGNOSIS — Z1231 Encounter for screening mammogram for malignant neoplasm of breast: Secondary | ICD-10-CM

## 2015-01-02 ENCOUNTER — Ambulatory Visit (HOSPITAL_COMMUNITY)
Admission: RE | Admit: 2015-01-02 | Discharge: 2015-01-02 | Disposition: A | Discharge status: Home / Self Care | Source: Ambulatory Visit | Attending: Family Medicine | Admitting: Family Medicine

## 2015-01-02 DIAGNOSIS — Z1231 Encounter for screening mammogram for malignant neoplasm of breast: Secondary | ICD-10-CM | POA: Insufficient documentation

## 2015-01-23 LAB — HM DIABETES EYE EXAM

## 2015-02-06 ENCOUNTER — Encounter: Payer: Self-pay | Admitting: Podiatry

## 2015-02-06 ENCOUNTER — Ambulatory Visit (INDEPENDENT_AMBULATORY_CARE_PROVIDER_SITE_OTHER): Admitting: Podiatry

## 2015-02-06 VITALS — BP 153/86 | HR 77 | Temp 99.0°F | Resp 14

## 2015-02-06 DIAGNOSIS — L84 Corns and callosities: Secondary | ICD-10-CM | POA: Diagnosis not present

## 2015-02-06 DIAGNOSIS — B351 Tinea unguium: Secondary | ICD-10-CM | POA: Diagnosis not present

## 2015-02-06 DIAGNOSIS — M79676 Pain in unspecified toe(s): Secondary | ICD-10-CM | POA: Diagnosis not present

## 2015-02-06 NOTE — Progress Notes (Signed)
   Subjective:    Patient ID: Stephanie Joseph, female    DOB: February 14, 1961, 54 y.o.   MRN: [id number]  HPI This patient presents today complaining of 1 year history of pain in the fifth toenial areas bilaterally aggravated with walking wearing shoes relieved with removal shoes. She denies any specific treatment or professional evaluation.  She is a type II diabetic for the past 1 year She denies any history of foot ulceration, claudication, or amputation      Review of Systems  HENT: Positive for hearing loss.   Genitourinary: Positive for urgency.  Musculoskeletal: Positive for back pain.       Joint pain       Objective:   Physical Exam  Orientated 3. Patient's husband present in the treatment room today  Vascular: No peripheral edema noted bilaterally DP and PT pulses 2/4 bilaterally Capillary reflex immediate bilaterally  Neurological: Sensation to 10 g monofilament wire intact 5/5 bilaterally Vibratory sensation reactive bilaterally Ankle reflex equal and reactive bilaterally  Dermatological: Hypertrophic deformed fifth toenails bilaterally The lateral margins the fifth toenails have hyperkeratotic tissue, bilaterally Well-healed surgical scar dorsal right first MPJ  Musculoskeletal: HAV deformity left Varus rotation fourth and fifth toes bilaterally     Assessment & Plan:   Assessment: Satisfactory neurovascular status 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

## 2015-02-06 NOTE — Patient Instructions (Signed)
Diabetes and Foot Care Diabetes may cause you to have problems because of poor blood supply (circulation) to your feet and legs. This may cause the skin on your feet to become thinner, break easier, and heal more slowly. Your skin may become dry, and the skin may peel and crack. You may also have nerve damage in your legs and feet causing decreased feeling in them. You may not notice minor injuries to your feet that could lead to infections or more serious problems. Taking care of your feet is one of the most important things you can do for yourself.  HOME CARE INSTRUCTIONS  Wear shoes at all times, even in the house. Do not go barefoot. Bare feet are easily injured.  Check your feet daily for blisters, cuts, and redness. If you cannot see the bottom of your feet, use a mirror or ask someone for help.  Wash your feet with warm water (do not use hot water) and mild soap. Then pat your feet and the areas between your toes until they are completely dry. Do not soak your feet as this can dry your skin.  Apply a moisturizing lotion or petroleum jelly (that does not contain alcohol and is unscented) to the skin on your feet and to dry, brittle toenails. Do not apply lotion between your toes.  Trim your toenails straight across. Do not dig under them or around the cuticle. File the edges of your nails with an emery board or nail file.  Do not cut corns or calluses or try to remove them with medicine.  Wear clean socks or stockings every day. Make sure they are not too tight. Do not wear knee-high stockings since they may decrease blood flow to your legs.  Wear shoes that fit properly and have enough cushioning. To break in new shoes, wear them for just a few hours a day. This prevents you from injuring your feet. Always look in your shoes before you put them on to be sure there are no objects inside.  Do not cross your legs. This may decrease the blood flow to your feet.  If you find a minor scrape,  cut, or break in the skin on your feet, keep it and the skin around it clean and dry. These areas may be cleansed with mild soap and water. Do not cleanse the area with peroxide, alcohol, or iodine.  When you remove an adhesive bandage, be sure not to damage the skin around it.  If you have a wound, look at it several times a day to make sure it is healing.  Do not use heating pads or hot water bottles. They may burn your skin. If you have lost feeling in your feet or legs, you may not know it is happening until it is too late.  Make sure your health care provider performs a complete foot exam at least annually or more often if you have foot problems. Report any cuts, sores, or bruises to your health care provider immediately. SEEK MEDICAL CARE IF:   You have an injury that is not healing.  You have cuts or breaks in the skin.  You have an ingrown nail.  You notice redness on your legs or feet.  You feel burning or tingling in your legs or feet.  You have pain or cramps in your legs and feet.  Your legs or feet are numb.  Your feet always feel cold. SEEK IMMEDIATE MEDICAL CARE IF:   There is increasing redness,   swelling, or pain in or around a wound.  There is a red line that goes up your leg.  Pus is coming from a wound.  You develop a fever or as directed by your health care provider.  You notice a bad smell coming from an ulcer or wound. Document Released: 06/20/2000 Document Revised: 02/23/2013 Document Reviewed: 11/30/2012 [employer] Patient Information 2015 [employer]. This information is not intended to replace advice given to you by your health care provider. Make sure you discuss any questions you have with your health care provider.  

## 2015-04-22 ENCOUNTER — Emergency Department (HOSPITAL_COMMUNITY)

## 2015-04-22 ENCOUNTER — Encounter (HOSPITAL_COMMUNITY): Payer: Self-pay | Admitting: Nurse Practitioner

## 2015-04-22 ENCOUNTER — Emergency Department (HOSPITAL_COMMUNITY)
Admission: EM | Admit: 2015-04-22 | Discharge: 2015-04-22 | Disposition: A | Discharge status: Home / Self Care | Attending: Emergency Medicine | Admitting: Emergency Medicine

## 2015-04-22 DIAGNOSIS — Z720 Tobacco use: Secondary | ICD-10-CM | POA: Diagnosis not present

## 2015-04-22 DIAGNOSIS — Z862 Personal history of diseases of the blood and blood-forming organs and certain disorders involving the immune mechanism: Secondary | ICD-10-CM | POA: Diagnosis not present

## 2015-04-22 DIAGNOSIS — Z79899 Other long term (current) drug therapy: Secondary | ICD-10-CM | POA: Diagnosis not present

## 2015-04-22 DIAGNOSIS — M545 Low back pain: Secondary | ICD-10-CM

## 2015-04-22 DIAGNOSIS — Z8701 Personal history of pneumonia (recurrent): Secondary | ICD-10-CM | POA: Insufficient documentation

## 2015-04-22 DIAGNOSIS — E119 Type 2 diabetes mellitus without complications: Secondary | ICD-10-CM | POA: Diagnosis not present

## 2015-04-22 DIAGNOSIS — R1030 Lower abdominal pain, unspecified: Secondary | ICD-10-CM | POA: Insufficient documentation

## 2015-04-22 DIAGNOSIS — F329 Major depressive disorder, single episode, unspecified: Secondary | ICD-10-CM | POA: Insufficient documentation

## 2015-04-22 DIAGNOSIS — M5440 Lumbago with sciatica, unspecified side: Secondary | ICD-10-CM | POA: Diagnosis not present

## 2015-04-22 DIAGNOSIS — M1990 Unspecified osteoarthritis, unspecified site: Secondary | ICD-10-CM | POA: Insufficient documentation

## 2015-04-22 DIAGNOSIS — I10 Essential (primary) hypertension: Secondary | ICD-10-CM | POA: Diagnosis not present

## 2015-04-22 HISTORY — DX: Endocarditis, valve unspecified: I38

## 2015-04-22 HISTORY — DX: Unspecified osteoarthritis, unspecified site: M19.90

## 2015-04-22 LAB — CBC
HCT: 37.8 % (ref 36.0–46.0)
Hemoglobin: 12.3 g/dL (ref 12.0–15.0)
MCH: 26.6 pg (ref 26.0–34.0)
MCHC: 32.5 g/dL (ref 30.0–36.0)
MCV: 81.6 fL (ref 78.0–100.0)
Platelets: 266 10*3/uL (ref 150–400)
RBC: 4.63 MIL/uL (ref 3.87–5.11)
RDW: 14.2 % (ref 11.5–15.5)
WBC: 5 10*3/uL (ref 4.0–10.5)

## 2015-04-22 LAB — COMPREHENSIVE METABOLIC PANEL
ALT: 8 U/L — ABNORMAL LOW (ref 14–54)
AST: 12 U/L — ABNORMAL LOW (ref 15–41)
Albumin: 3.3 g/dL — ABNORMAL LOW (ref 3.5–5.0)
Alkaline Phosphatase: 73 U/L (ref 38–126)
Anion gap: 7 (ref 5–15)
BUN: 12 mg/dL (ref 6–20)
CO2: 23 mmol/L (ref 22–32)
Calcium: 8.4 mg/dL — ABNORMAL LOW (ref 8.9–10.3)
Chloride: 103 mmol/L (ref 101–111)
Creatinine, Ser: 1 mg/dL (ref 0.44–1.00)
GFR calc Af Amer: >60 mL/min (ref >60)
GFR calc non Af Amer: >60 mL/min (ref >60)
Glucose, Bld: 105 mg/dL — ABNORMAL HIGH (ref 65–99)
Potassium: 4 mmol/L (ref 3.5–5.1)
Sodium: 133 mmol/L — ABNORMAL LOW (ref 135–145)
Total Bilirubin: 0.5 mg/dL (ref 0.3–1.2)
Total Protein: 6.5 g/dL (ref 6.5–8.1)

## 2015-04-22 LAB — URINALYSIS, ROUTINE W REFLEX MICROSCOPIC
Bilirubin Urine: NEGATIVE
Glucose, UA: NEGATIVE mg/dL
Hgb urine dipstick: NEGATIVE
Ketones, ur: NEGATIVE mg/dL
Leukocytes, UA: NEGATIVE
Nitrite: NEGATIVE
Protein, ur: NEGATIVE mg/dL
Specific Gravity, Urine: 1.009 (ref 1.005–1.030)
Urobilinogen, UA: 0.2 mg/dL (ref 0.0–1.0)
pH: 5.5 (ref 5.0–8.0)

## 2015-04-22 MED ORDER — METHOCARBAMOL 500 MG PO TABS
1000.0000 mg | ORAL_TABLET | Freq: Once | ORAL | Status: AC
  Administered 2015-04-22: 1000 mg via ORAL
  Filled 2015-04-22: qty 2

## 2015-04-22 MED ORDER — METHOCARBAMOL 500 MG PO TABS: 500.0000 mg | ORAL_TABLET | Freq: Four times a day (QID) | ORAL | Status: DC | PRN

## 2015-04-22 NOTE — ED Provider Notes (Signed)
CSN: [id number]     Arrival date & time 04/22/15  1152 History   First MD Initiated Contact with Patient 04/22/15 1206     Chief Complaint  Patient presents with  . Abdominal Pain  . Back Pain     (Consider location/radiation/quality/duration/timing/severity/associated sxs/prior Treatment) The history is provided by the patient.     Patient with hx DM, HTN p/w low back pain that wraps around to her lower abdomen and into her anterior thighs.  Exacerbated by walking, bending over.  The pain in her legs is cramping.  The pain in her back and abdomen is pressure-like.  9/10 intensity, constant.  Has had this pain twice before this year, thought it was due to old mattress but has since replaced mattress..  Denies fevers, chills, loss of control of bowel or bladder, weakness of numbness of the extremities, saddle anesthesia, bowel, urinary, or vaginal complaints.   Pt does state her blood sugar was low yesterday, reports it was 96.    Past Medical History  Diagnosis Date  . Hypertension   . Mitral regurgitation   . Depression   . Gout   . Anemia, iron deficiency   . Dysmenorrhea   . Menorrhagia   . Pneumonia   . Diabetes mellitus without complication (HCC)   . Heart valve problem   . Arthritis    Past Surgical History  Procedure Laterality Date  . Knee arthroscopy      Left   Family History  Problem Relation Age of Onset  . Heart disease Sister   . Heart disease Brother    Social History  Substance Use Topics  . Smoking status: Current Every Day Smoker -- 0.50 packs/day    Types: Cigarettes  . Smokeless tobacco: Never Used  . Alcohol Use: No   OB History    Gravida Para Term Preterm AB TAB SAB Ectopic Multiple Living   3 1  1 2 1 1   1      Review of Systems  All other systems reviewed and are negative.     Allergies  Review of patient's allergies indicates no known allergies.  Home Medications   Prior to Admission medications   Medication Sig Start Date End  Date Taking? Authorizing Provider  amLODipine (NORVASC) 10 MG tablet Take 10 mg by mouth daily.     Historical Provider, MD  cyclobenzaprine (FLEXERIL) 10 MG tablet Take 1 tablet (10 mg total) by mouth 3 (three) times daily as needed for muscle spasms. 02/21/14   Charm RingsErin J Honig, MD  diclofenac (VOLTAREN) 75 MG EC tablet Take 1 tablet (75 mg total) by mouth 2 (two) times daily as needed for moderate pain. 02/21/14   Charm RingsErin J Honig, MD  empagliflozin (JARDIANCE) 10 MG TABS tablet Take 10 mg by mouth daily.    Historical Provider, MD  estradiol (ESTRACE) 1 MG tablet Take 1 tablet (1 mg total) by mouth daily. 10/04/14   Adam PhenixJames G Arnold, MD  etodolac (LODINE) 500 MG tablet Take 1 tablet (500 mg total) by mouth 2 (two) times daily as needed. 04/25/13   Graylon GoodZachary H Baker, PA-C  hydrochlorothiazide 25 MG tablet Take 25 mg by mouth daily.      Historical Provider, MD  hydrOXYzine (ATARAX/VISTARIL) 50 MG tablet Take 50 mg by mouth 3 (three) times daily as needed for itching.    Historical Provider, MD  losartan (COZAAR) 25 MG tablet Take 50 mg by mouth daily.     Historical Provider, MD  medroxyPROGESTERone (  PROVERA) 5 MG tablet Take 1 tablet (5 mg total) by mouth daily. 10/04/14   Adam Phenix, MD  metFORMIN (GLUCOPHAGE) 500 MG tablet Take 500 mg by mouth daily.     Historical Provider, MD  QUEtiapine (SEROQUEL) 50 MG tablet Take 50 mg by mouth at bedtime.    Historical Provider, MD  rOPINIRole (REQUIP) 1 MG tablet Take 1 mg by mouth daily.    Historical Provider, MD  sitaGLIPtin (JANUVIA) 100 MG tablet Take 100 mg by mouth daily.    Historical Provider, MD  venlafaxine (EFFEXOR) 75 MG tablet Take 75 mg by mouth 3 (three) times daily with meals.    Historical Provider, MD   BP 147/85 mmHg  Pulse 83  Temp(Src) 97.7 F (36.5 C) (Oral)  Resp 14  Ht  (1.702 m)  Wt 213 lb 6 oz (96.786 kg)  BMI 33.41 kg/m2  SpO2 98% Physical Exam  Constitutional: She appears well-developed and well-nourished. No distress.    HENT:  Head: Normocephalic and atraumatic.  Neck: Neck supple.  Pulmonary/Chest: Effort normal.  Abdominal: Soft. She exhibits no distension and no mass. There is generalized tenderness. There is no rebound and no guarding.  Musculoskeletal:       Back:  Spine nontender, no crepitus, or stepoffs. Lower extremities:  Strength 5/5, sensation intact, distal pulses intact.    Gait is normal   Neurological: She is alert.  Skin: She is not diaphoretic.  Nursing note and vitals reviewed.   ED Course  Procedures (including critical care time) Labs Review Labs Reviewed  COMPREHENSIVE METABOLIC PANEL - Abnormal; Notable for the following:    Sodium 133 (*)    Glucose, Bld 105 (*)    Calcium 8.4 (*)    Albumin 3.3 (*)    AST 12 (*)    ALT 8 (*)    All other components within normal limits  URINALYSIS, ROUTINE W REFLEX MICROSCOPIC (NOT AT [facility]) - Abnormal; Notable for the following:    APPearance HAZY (*)    All other components within normal limits  CBC    Imaging Review Dg Lumbar Spine Complete  04/22/2015  CLINICAL DATA:  Initial encounter for Pt complains of lower back pain x 3 days; pain is more on the right side; no known injury or history of back surgery LMP: pt states she no longer has a period due to hormonal medication she takes EXAM: LUMBAR SPINE - COMPLETE 4+ VIEW COMPARISON:  None. FINDINGS: Five lumbar type vertebral bodies. Sacroiliac joints are symmetric. Maintenance of vertebral body height and alignment. Degenerative disc disease, most advanced at L5-S1 and L1-2. Facet arthropathy at L4-S1. IMPRESSION: Spondylosis, without acute osseous finding. Electronically Signed   By: Jeronimo Greaves M.D.   On: 04/22/2015 14:01   I have personally reviewed and evaluated these images and lab results as part of my medical decision-making.   EKG Interpretation None      MDM   Final diagnoses:  Bilateral low back pain, with sciatica presence unspecified   Afebrile, nontoxic  patient with mechanical low back pain. No red flags.  Doubt acute intraabdominal pathology.  Pt feeling better with robaxin.  DDD and spondylosis on lumbar film.  D/C home with robaxin, PCP follow up.  Discussed result, findings, treatment, and follow up  with patient.  Pt given return precautions.  Pt verbalizes understanding and agrees with plan.        Trixie Dredge, PA-C 04/22/15 1504  Geoffery Lyons, MD 04/22/15 1539

## 2015-04-22 NOTE — Discharge Instructions (Signed)
Read the information below.  Use the prescribed medication as directed.  Please discuss all new medications with your pharmacist.  You may return to the Emergency Department at any time for worsening condition or any new symptoms that concern you.    If you develop fevers, loss of control of bowel or bladder, weakness or numbness in your legs, or are unable to walk, return to the ER for a recheck.    Back Pain, Adult Back pain is very common in adults.The cause of back pain is rarely dangerous and the pain often gets better over time.The cause of your back pain may not be known. Some common causes of back pain include: 1. Strain of the muscles or ligaments supporting the spine. 2. Wear and tear (degeneration) of the spinal disks. 3. Arthritis. 4. Direct injury to the back. For many people, back pain may return. Since back pain is rarely dangerous, most people can learn to manage this condition on their own. HOME CARE INSTRUCTIONS Watch your back pain for any changes. The following actions may help to lessen any discomfort you are feeling: 1. Remain active. It is stressful on your back to sit or stand in one place for long periods of time. Do not sit, drive, or stand in one place for more than 30 minutes at a time. Take short walks on even surfaces as soon as you are able.Try to increase the length of time you walk each day. 2. Exercise regularly as directed by your health care provider. Exercise helps your back heal faster. It also helps avoid future injury by keeping your muscles strong and flexible. 3. Do not stay in bed.Resting more than 1-2 days can delay your recovery. 4. Pay attention to your body when you bend and lift. The most comfortable positions are those that put less stress on your recovering back. Always use proper lifting techniques, including: 1. Bending your knees. 2. Keeping the load close to your body. 3. Avoiding twisting. 5. Find a comfortable position to sleep. Use a  firm mattress and lie on your side with your knees slightly bent. If you lie on your back, put a pillow under your knees. 6. Avoid feeling anxious or stressed.Stress increases muscle tension and can worsen back pain.It is important to recognize when you are anxious or stressed and learn ways to manage it, such as with exercise. 7. Take medicines only as directed by your health care provider. Over-the-counter medicines to reduce pain and inflammation are often the most helpful.Your health care provider may prescribe muscle relaxant drugs.These medicines help dull your pain so you can more quickly return to your normal activities and healthy exercise. 8. Apply ice to the injured area: 1. Put ice in a plastic bag. 2. Place a towel between your skin and the bag. 3. Leave the ice on for 20 minutes, 2-3 times a day for the first 2-3 days. After that, ice and heat may be alternated to reduce pain and spasms. 9. Maintain a healthy weight. Excess weight puts extra stress on your back and makes it difficult to maintain good posture. SEEK MEDICAL CARE IF: 1. You have pain that is not relieved with rest or medicine. 2. You have increasing pain going down into the legs or buttocks. 3. You have pain that does not improve in one week. 4. You have night pain. 5. You lose weight. 6. You have a fever or chills. SEEK IMMEDIATE MEDICAL CARE IF:  1. You develop new bowel or bladder control  problems. 2. You have unusual weakness or numbness in your arms or legs. 3. You develop nausea or vomiting. 4. You develop abdominal pain. 5. You feel faint.   This information is not intended to replace advice given to you by your health care provider. Make sure you discuss any questions you have with your health care provider.   Document Released: 06/23/2005 Document Revised: 07/14/2014 Document Reviewed: 10/25/2013 Elsevier Interactive Patient Education 2016 [employer]  Back Exercises The following exercises  strengthen the muscles that help to support the back. They also help to keep the lower back flexible. Doing these exercises can help to prevent back pain or lessen existing pain. If you have back pain or discomfort, try doing these exercises 2-3 times each day or as told by your health care provider. When the pain goes away, do them once each day, but increase the number of times that you repeat the steps for each exercise (do more repetitions). If you do not have back pain or discomfort, do these exercises once each day or as told by your health care provider. EXERCISES Single Knee to Chest Repeat these steps 3-5 times for each leg: 5. Lie on your back on a firm bed or the floor with your legs extended. 6. Bring one knee to your chest. Your other leg should stay extended and in contact with the floor. 7. Hold your knee in place by grabbing your knee or thigh. 8. Pull on your knee until you feel a gentle stretch in your lower back. 9. Hold the stretch for 10-30 seconds. 10. Slowly release and straighten your leg. Pelvic Tilt Repeat these steps 5-10 times: 10. Lie on your back on a firm bed or the floor with your legs extended. 11. Bend your knees so they are pointing toward the ceiling and your feet are flat on the floor. 12. Tighten your lower abdominal muscles to press your lower back against the floor. This motion will tilt your pelvis so your tailbone points up toward the ceiling instead of pointing to your feet or the floor. 13. With gentle tension and even breathing, hold this position for 5-10 seconds. Cat-Cow Repeat these steps until your lower back becomes more flexible: 7. Get into a hands-and-knees position on a firm surface. Keep your hands under your shoulders, and keep your knees under your hips. You may place padding under your knees for comfort. 8. Let your head hang down, and point your tailbone toward the floor so your lower back becomes rounded like the back of a cat. 9. Hold  this position for 5 seconds. 10. Slowly lift your head and point your tailbone up toward the ceiling so your back forms a sagging arch like the back of a cow. 11. Hold this position for 5 seconds. Press-Ups Repeat these steps 5-10 times: 6. Lie on your abdomen (face-down) on the floor. 7. Place your palms near your head, about shoulder-width apart. 8. While you keep your back as relaxed as possible and keep your hips on the floor, slowly straighten your arms to raise the top half of your body and lift your shoulders. Do not use your back muscles to raise your upper torso. You may adjust the placement of your hands to make yourself more comfortable. 9. Hold this position for 5 seconds while you keep your back relaxed. 10. Slowly return to lying flat on the floor. Bridges Repeat these steps 10 times: 1. Lie on your back on a firm surface. 2. [place] your  knees so they are pointing toward the ceiling and your feet are flat on the floor. 3. Tighten your buttocks muscles and lift your buttocks off of the floor until your waist is at almost the same height as your knees. You should feel the muscles working in your buttocks and the back of your thighs. If you do not feel these muscles, slide your feet 1-2 inches farther away from your buttocks. 4. Hold this position for 3-5 seconds. 5. Slowly lower your hips to the starting position, and allow your buttocks muscles to relax completely. If this exercise is too easy, try doing it with your arms crossed over your chest. Abdominal Crunches Repeat these steps 5-10 times: 1. Lie on your back on a firm bed or the floor with your legs extended. 2. Bend your knees so they are pointing toward the ceiling and your feet are flat on the floor. 3. Cross your arms over your chest. 4. Tip your chin slightly toward your chest without bending your neck. 5. Tighten your abdominal muscles and slowly raise your trunk (torso) high enough to lift your shoulder blades a tiny  bit off of the floor. Avoid raising your torso higher than that, because it can put too much stress on your low back and it does not help to strengthen your abdominal muscles. 6. Slowly return to your starting position. Back Lifts Repeat these steps 5-10 times: 1. Lie on your abdomen (face-down) with your arms at your sides, and rest your forehead on the floor. 2. Tighten the muscles in your legs and your buttocks. 3. Slowly lift your chest off of the floor while you keep your hips pressed to the floor. Keep the back of your head in line with the curve in your back. Your eyes should be looking at the floor. 4. Hold this position for 3-5 seconds. 5. Slowly return to your starting position. SEEK MEDICAL CARE IF:  Your back pain or discomfort gets much worse when you do an exercise.  Your back pain or discomfort does not lessen within 2 hours after you exercise. If you have any of these problems, stop doing these exercises right away. Do not do them again unless your health care provider says that you can. SEEK IMMEDIATE MEDICAL CARE IF:  You develop sudden, severe back pain. If this happens, stop doing the exercises right away. Do not do them again unless your health care provider says that you can.   This information is not intended to replace advice given to you by your health care provider. Make sure you discuss any questions you have with your health care provider.   Document Released: 07/31/2004 Document Revised: 03/14/2015 Document Reviewed: 08/17/2014 Elsevier Interactive Patient Education [employer] Inc[employer]

## 2015-04-22 NOTE — ED Notes (Signed)
Pt c/o lower back , lower abd pain radiating down bilateral legs x 2 days. She describes as "Cramps." she had an episode of nausea and vomiting yesterday. She states her blood sugar was low yesterday. She is A&Ox4, resp e/u

## 2015-04-22 NOTE — ED Notes (Signed)
Patient transported to X-ray 

## 2015-06-07 ENCOUNTER — Encounter: Payer: Self-pay | Admitting: Cardiovascular Disease

## 2015-06-07 NOTE — Progress Notes (Signed)
Patient ID: Stephanie Joseph, female   DOB: 19-Jan-1961, 54 y.o.   MRN: [id number] 54 y.o.  referred for murmur and abnormal echo. Reviewed echo read by Dr Lynnea Ferrier in September. He indicated severe MR with no frank prolapse or flail. LV size and funciton normal. 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 Denies appetite suppresant use, RF, SBE. 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  I reviewed the echo and thought the MR was more moderate and in the absence of symptoms or clearly repairable lesion there is no indicatoin for agressive w/u at this time Discussed this with patient including diagnosis of MR and warning signs of symptomatic progression. BP suboptimally controlled and will add Cozzaar to med list for further afterload reduction.   Echo 5/14  Study Conclusions  - Left ventricle: The cavity size was normal. Wall thickness was increased in a pattern of mild LVH. Systolic function was normal. The estimated ejection fraction was in the range of 50% to 55%. Wall motion was normal; there were no regional wall motion abnormalities. - Aortic valve: Mild regurgitation. Mean gradient: 8mm Hg (S). Peak gradient: 14mm Hg (S). Trivial MR   ROS: Denies fever, malais, weight loss, blurry vision, decreased visual acuity, cough, sputum, SOB, hemoptysis, pleuritic pain, palpitaitons, heartburn, abdominal pain, melena, lower extremity edema, claudication, or rash.  All other systems reviewed and negative  General: Affect appropriate Healthy:  appears stated age HEENT: normal Neck supple with no adenopathy JVP normal no bruits no thyromegaly Lungs clear with no wheezing and good diaphragmatic motion Heart:  S1/S2 mild AS/AR murmur, no rub, gallop or click PMI normal Abdomen: benighn, BS positve, no tenderness, no AAA no bruit.  No HSM or HJR Distal pulses intact with no bruits No edema Neuro non-focal Skin warm and dry No  muscular weakness   Current Outpatient Prescriptions  Medication Sig Dispense Refill  . amLODipine (NORVASC) 10 MG tablet Take 10 mg by mouth daily.     . cyclobenzaprine (FLEXERIL) 10 MG tablet Take 1 tablet (10 mg total) by mouth 3 (three) times daily as needed for muscle spasms. 30 tablet 0  . diclofenac (VOLTAREN) 75 MG EC tablet Take 1 tablet (75 mg total) by mouth 2 (two) times daily as needed for moderate pain. 30 tablet 0  . empagliflozin (JARDIANCE) 10 MG TABS tablet Take 10 mg by mouth daily.    Marland Kitchen estradiol (ESTRACE) 1 MG tablet Take 1 tablet (1 mg total) by mouth daily. 30 tablet 12  . etodolac (LODINE) 500 MG tablet Take 1 tablet (500 mg total) by mouth 2 (two) times daily as needed. 30 tablet 0  . hydrochlorothiazide 25 MG tablet Take 25 mg by mouth daily.      . hydrOXYzine (ATARAX/VISTARIL) 50 MG tablet Take 50 mg by mouth 3 (three) times daily as needed for itching.    . losartan (COZAAR) 25 MG tablet Take 50 mg by mouth daily.     . medroxyPROGESTERone (PROVERA) 5 MG tablet Take 1 tablet (5 mg total) by mouth daily. 30 tablet 12  . metFORMIN (GLUCOPHAGE) 500 MG tablet Take 500 mg by mouth daily.     . methocarbamol (ROBAXIN) 500 MG tablet Take 1-2 tablets (500-1,000 mg total) by mouth every 6 (six) hours as needed. 20 tablet 0  . QUEtiapine (SEROQUEL) 50 MG tablet Take 50 mg by mouth at bedtime.    Marland Kitchen rOPINIRole (REQUIP) 1 MG tablet  Take 1 mg by mouth daily.    . sitaGLIPtin (JANUVIA) 100 MG tablet Take 100 mg by mouth daily.    Marland Kitchen. venlafaxine (EFFEXOR) 75 MG tablet Take 75 mg by mouth 3 (three) times daily with meals.     No current facility-administered medications for this visit.    Allergies  Review of patient's allergies indicates no known allergies.  Electrocardiogram:  5/15  SR rate 67 normal   Assessment and Plan HTN: AS: MR: Previous Drug Use: DM:  Stephanie Joseph

## 2015-06-08 ENCOUNTER — Other Ambulatory Visit (HOSPITAL_COMMUNITY): Payer: PRIVATE HEALTH INSURANCE

## 2015-06-08 ENCOUNTER — Encounter: Payer: PRIVATE HEALTH INSURANCE | Admitting: Cardiovascular Disease

## 2015-06-15 LAB — HM DIABETES EYE EXAM

## 2015-07-10 ENCOUNTER — Encounter: Payer: Self-pay | Admitting: Family

## 2015-07-10 ENCOUNTER — Ambulatory Visit (INDEPENDENT_AMBULATORY_CARE_PROVIDER_SITE_OTHER): Admitting: Family

## 2015-07-10 ENCOUNTER — Other Ambulatory Visit (INDEPENDENT_AMBULATORY_CARE_PROVIDER_SITE_OTHER)

## 2015-07-10 ENCOUNTER — Telehealth: Payer: Self-pay | Admitting: Family

## 2015-07-10 VITALS — BP 144/98 | HR 77 | Temp 98.3°F | Resp 18 | Ht 67.0 in | Wt 215.0 lb

## 2015-07-10 DIAGNOSIS — F329 Major depressive disorder, single episode, unspecified: Secondary | ICD-10-CM

## 2015-07-10 DIAGNOSIS — E119 Type 2 diabetes mellitus without complications: Secondary | ICD-10-CM | POA: Diagnosis not present

## 2015-07-10 DIAGNOSIS — I10 Essential (primary) hypertension: Secondary | ICD-10-CM | POA: Diagnosis not present

## 2015-07-10 DIAGNOSIS — E1165 Type 2 diabetes mellitus with hyperglycemia: Secondary | ICD-10-CM | POA: Insufficient documentation

## 2015-07-10 DIAGNOSIS — E1169 Type 2 diabetes mellitus with other specified complication: Secondary | ICD-10-CM | POA: Insufficient documentation

## 2015-07-10 DIAGNOSIS — F32A Depression, unspecified: Secondary | ICD-10-CM

## 2015-07-10 LAB — MICROALBUMIN / CREATININE URINE RATIO
Creatinine,U: 221.2 mg/dL
Microalb Creat Ratio: 0.6 mg/g (ref 0.0–30.0)
Microalb, Ur: 1.3 mg/dL (ref 0.0–1.9)

## 2015-07-10 LAB — COMPREHENSIVE METABOLIC PANEL
ALT: 8 U/L (ref 0–35)
AST: 11 U/L (ref 0–37)
Albumin: 3.9 g/dL (ref 3.5–5.2)
Alkaline Phosphatase: 73 U/L (ref 39–117)
BUN: 13 mg/dL (ref 6–23)
CO2: 26 mEq/L (ref 19–32)
Calcium: 9.2 mg/dL (ref 8.4–10.5)
Chloride: 107 mEq/L (ref 96–112)
Creatinine, Ser: 0.91 mg/dL (ref 0.40–1.20)
GFR: 82.63 mL/min (ref >60.00)
Glucose, Bld: 104 mg/dL — ABNORMAL HIGH (ref 70–99)
Potassium: 4.1 mEq/L (ref 3.5–5.1)
Sodium: 139 mEq/L (ref 135–145)
Total Bilirubin: 0.4 mg/dL (ref 0.2–1.2)
Total Protein: 7.1 g/dL (ref 6.0–8.3)

## 2015-07-10 LAB — HEMOGLOBIN A1C: Hgb A1c MFr Bld: 5.8 % (ref 4.6–6.5)

## 2015-07-10 MED ORDER — AMLODIPINE BESYLATE 10 MG PO TABS: 10.0000 mg | ORAL_TABLET | Freq: Every day | ORAL | Status: DC

## 2015-07-10 MED ORDER — LOSARTAN POTASSIUM 100 MG PO TABS: 100.0000 mg | ORAL_TABLET | Freq: Every day | ORAL | Status: DC

## 2015-07-10 MED ORDER — ROPINIROLE HCL 1 MG PO TABS: 1.0000 mg | ORAL_TABLET | Freq: Every day | ORAL | Status: DC

## 2015-07-10 MED ORDER — METHOCARBAMOL 500 MG PO TABS: 500.0000 mg | ORAL_TABLET | Freq: Four times a day (QID) | ORAL | Status: DC | PRN

## 2015-07-10 NOTE — Assessment & Plan Note (Signed)
Stable with current medication and denies adverse side effects. Mood is stable with no suicidal ideation. Management and changes per [facility]. Continue current dosage quetiapine, venlafaxine, and hydroxyzine.

## 2015-07-10 NOTE — Assessment & Plan Note (Signed)
Blood pressure remains elevated with current regimen above goal of 140/90. Denies adverse side effects. Obtain CMET to check kidney function and electrolytes. If appropriate will increase losartan. Continue current dosage of amlodipine, hydrochlorothiazide, and losartan pending blood work.

## 2015-07-10 NOTE — Assessment & Plan Note (Signed)
Obtain A1c, microalbumin, and complete metabolic panel to determine current status. Maintained on sitagliptin, empagliflozin, and metformin. Symptoms experienced unlikely related to hypoglycemia given blood sugar of 127. Continue to monitor if occurs again. Eye exam and foot exams are up to date. Maintained on losartan for CAD risk reduction. Not currently maintained on a statin. Continue current dosage of sitagliptin, empagliflozin, and metformin pending A1c results.

## 2015-07-10 NOTE — Progress Notes (Signed)
Pre visit review using our clinic review tool, if applicable. No additional management support is needed unless otherwise documented below in the visit note. 

## 2015-07-10 NOTE — Telephone Encounter (Signed)
Please inform patient that her blood work shows that her A1c is 5.8 which indicates exceptional control of her diabetes. We can consider going down on one of her medications is she continues to have lower readings. Otherwise her kidney function, electrolytes and liver function are within the normal limits. Her urine also confirms that her kidney function is good. We can plan to follow up in 1 month to check her blood pressure with the new increased dose of losartan.

## 2015-07-10 NOTE — Patient Instructions (Signed)
Thank you for choosing [facility].  Summary/Instructions:  Your prescription(s) have been submitted to your pharmacy or been printed and provided for you. Please take as directed and contact our office if you believe you are having problem(s) with the medication(s) or have any questions.  Please stop by the lab on the basement level of the building for your blood work. Your results will be released to MyChart (or called to you) after review, usually within 72 hours after test completion. If any changes need to be made, you will be notified at that same time.  If your symptoms worsen or fail to improve, please contact our office for further instruction, or in case of emergency go directly to the emergency room at the closest medical facility.   Please continue to take your medications as prescribed.  Smoking Cessation, Tips for Success If you are ready to quit smoking, congratulations! You have chosen to help yourself be healthier. Cigarettes bring nicotine, tar, carbon monoxide, and other irritants into your body. Your lungs, heart, and blood vessels will be able to work better without these poisons. There are many different ways to quit smoking. Nicotine gum, nicotine patches, a nicotine inhaler, or nicotine nasal spray can help with physical craving. Hypnosis, support groups, and medicines help break the habit of smoking. WHAT THINGS CAN I DO TO MAKE QUITTING EASIER?  Here are some tips to help you quit for good:  Pick a date when you will quit smoking completely. Tell all of your friends and family about your plan to quit on that date.  Do not try to slowly cut down on the number of cigarettes you are smoking. Pick a quit date and quit smoking completely starting on that day.  Throw away all cigarettes.   Clean and remove all ashtrays from your home, work, and car.  On a card, write down your reasons for quitting. Carry the card with you and read it when you get the urge to  smoke.  Cleanse your body of nicotine. Drink enough water and fluids to keep your urine clear or pale yellow. Do this after quitting to flush the nicotine from your body.  Learn to predict your moods. Do not let a bad situation be your excuse to have a cigarette. Some situations in your life might tempt you into wanting a cigarette.  Never have "just one" cigarette. It leads to wanting another and another. Remind yourself of your decision to quit.  Change habits associated with smoking. If you smoked while driving or when feeling stressed, try other activities to replace smoking. Stand up when drinking your coffee. Brush your teeth after eating. Sit in a different chair when you read the paper. Avoid alcohol while trying to quit, and try to drink fewer caffeinated beverages. Alcohol and caffeine may urge you to smoke.  Avoid foods and drinks that can trigger a desire to smoke, such as sugary or spicy foods and alcohol.  Ask people who smoke not to smoke around you.  Have something planned to do right after eating or having a cup of coffee. For example, plan to take a walk or exercise.  Try a relaxation exercise to calm you down and decrease your stress. Remember, you may be tense and nervous for the first 2 weeks after you quit, but this will pass.  Find new activities to keep your hands busy. Play with a pen, coin, or rubber band. Doodle or draw things on paper.  Brush your teeth right after  eating. This will help cut down on the craving for the taste of tobacco after meals. You can also try mouthwash.   Use oral substitutes in place of cigarettes. Try using lemon drops, carrots, cinnamon sticks, or chewing gum. Keep them handy so they are available when you have the urge to smoke.  When you have the urge to smoke, try deep breathing.  Designate your home as a nonsmoking area.  If you are a heavy smoker, ask your health care provider about a prescription for nicotine chewing gum. It can  ease your withdrawal from nicotine.  Reward yourself. Set aside the cigarette money you save and buy yourself something nice.  Look for support from others. Join a support group or smoking cessation program. Ask someone at home or at work to help you with your plan to quit smoking.  Always ask yourself, "Do I need this cigarette or is this just a reflex?" Tell yourself, "Today, I choose not to smoke," or "I do not want to smoke." You are reminding yourself of your decision to quit.  Do not replace cigarette smoking with electronic cigarettes (commonly called e-cigarettes). The safety of e-cigarettes is unknown, and some may contain harmful chemicals.  If you relapse, do not give up! Plan ahead and think about what you will do the next time you get the urge to smoke. HOW WILL I FEEL WHEN I QUIT SMOKING? You may have symptoms of withdrawal because your body is used to nicotine (the addictive substance in cigarettes). You may crave cigarettes, be irritable, feel very hungry, cough often, get headaches, or have difficulty concentrating. The withdrawal symptoms are only temporary. They are strongest when you first quit but will go away within 10-14 days. When withdrawal symptoms occur, stay in control. Think about your reasons for quitting. Remind yourself that these are signs that your body is healing and getting used to being without cigarettes. Remember that withdrawal symptoms are easier to treat than the major diseases that smoking can cause.  Even after the withdrawal is over, expect periodic urges to smoke. However, these cravings are generally short lived and will go away whether you smoke or not. Do not smoke! WHAT RESOURCES ARE AVAILABLE TO HELP ME QUIT SMOKING? Your health care provider can direct you to community resources or hospitals for support, which may include:  Group support.  Education.  Hypnosis.  Therapy.   This information is not intended to replace advice given to you by  your health care provider. Make sure you discuss any questions you have with your health care provider.   Document Released: 03/21/2004 Document Revised: 07/14/2014 Document Reviewed: 12/09/2012 Elsevier Interactive Patient Education 2016 [employer].  Steps to Quit Smoking  Smoking tobacco can be harmful to your health and can affect almost every organ in your body. Smoking puts you, and those around you, at risk for developing many serious chronic diseases. Quitting smoking is difficult, but it is one of the best things that you can do for your health. It is never too late to quit. WHAT ARE THE BENEFITS OF QUITTING SMOKING? When you quit smoking, you lower your risk of developing serious diseases and conditions, such as:  Lung cancer or lung disease, such as COPD.  Heart disease.  Stroke.  Heart attack.  Infertility.  Osteoporosis and bone fractures. Additionally, symptoms such as coughing, wheezing, and shortness of breath may get better when you quit. You may also find that you get sick less often because your body  is stronger at fighting off colds and infections. If you are pregnant, quitting smoking can help to reduce your chances of having a baby of low birth weight. HOW DO I GET READY TO QUIT? When you decide to quit smoking, create a plan to make sure that you are successful. Before you quit:  Pick a date to quit. Set a date within the next two weeks to give you time to prepare.  Write down the reasons why you are quitting. Keep this list in places where you will see it often, such as on your bathroom mirror or in your car or wallet.  Identify the people, places, things, and activities that make you want to smoke (triggers) and avoid them. Make sure to take these actions:  Throw away all cigarettes at home, at work, and in your car.  Throw away smoking accessories, such as Set designerashtrays and lighters.  Clean your car and make sure to empty the ashtray.  Clean your home,  including curtains and carpets.  Tell your family, friends, and coworkers that you are quitting. Support from your loved ones can make quitting easier.  Talk with your health care provider about your options for quitting smoking.  Find out what treatment options are covered by your health insurance. WHAT STRATEGIES CAN I USE TO QUIT SMOKING?  Talk with your healthcare provider about different strategies to quit smoking. Some strategies include:  Quitting smoking altogether instead of gradually lessening how much you smoke over a period of time. Research shows that quitting "cold [place]" is more successful than gradually quitting.  Attending in-person counseling to help you build problem-solving skills. You are more likely to have success in quitting if you attend several counseling sessions. Even short sessions of 10 minutes can be effective.  Finding resources and support systems that can help you to quit smoking and remain smoke-free after you quit. These resources are most helpful when you use them often. They can include:  Online chats with a Veterinary surgeoncounselor.  Telephone quitlines.  Printed Materials engineerself-help materials.  Support groups or group counseling.  Text messaging programs.  Mobile phone applications.  Taking medicines to help you quit smoking. (If you are pregnant or breastfeeding, talk with your health care provider first.) Some medicines contain nicotine and some do not. Both types of medicines help with cravings, but the medicines that include nicotine help to relieve withdrawal symptoms. Your health care provider may recommend:  Nicotine patches, gum, or lozenges.  Nicotine inhalers or sprays.  Non-nicotine medicine that is taken by mouth. Talk with your health care provider about combining strategies, such as taking medicines while you are also receiving in-person counseling. Using these two strategies together makes you more likely to succeed in quitting than if you used either  strategy on its own. If you are pregnant or breastfeeding, talk with your health care provider about finding counseling or other support strategies to quit smoking. Do not take medicine to help you quit smoking unless told to do so by your health care provider. WHAT THINGS CAN I DO TO MAKE IT EASIER TO QUIT? Quitting smoking might feel overwhelming at first, but there is a lot that you can do to make it easier. Take these important actions:  Reach out to your family and friends and ask that they support and encourage you during this time. Call telephone quitlines, reach out to support groups, or work with a counselor for support.  Ask people who smoke to avoid smoking around you.  Avoid  places that trigger you to smoke, such as bars, parties, or smoke-break areas at work.  Spend time around people who do not smoke.  Lessen stress in your life, because stress can be a smoking trigger for some people. To lessen stress, try:  Exercising regularly.  Deep-breathing exercises.  Yoga.  Meditating.  Performing a body scan. This involves closing your eyes, scanning your body from head to toe, and noticing which parts of your body are particularly tense. Purposefully relax the muscles in those areas.  Download or purchase mobile phone or tablet apps (applications) that can help you stick to your quit plan by providing reminders, tips, and encouragement. There are many free apps, such as QuitGuide from the [employer] developer for Disease Control and Prevention). You can find other support for quitting smoking (smoking cessation) through smokefree.gov and other websites. HOW WILL I FEEL WHEN I QUIT SMOKING? Within the first 24 hours of quitting smoking, you may start to feel some withdrawal symptoms. These symptoms are usually most noticeable 2-3 days after quitting, but they usually do not last beyond 2-3 weeks. Changes or symptoms that you might experience include:  Mood swings.  Restlessness, anxiety,  or irritation.  Difficulty concentrating.  Dizziness.  Strong cravings for sugary foods in addition to nicotine.  Mild weight gain.  Constipation.  Nausea.  Coughing or a sore throat.  Changes in how your medicines work in your body.  A depressed mood.  Difficulty sleeping (insomnia). After the first 2-3 weeks of quitting, you may start to notice more positive results, such as:  Improved sense of smell and taste.  Decreased coughing and sore throat.  Slower heart rate.  Lower blood pressure.  Clearer skin.  The ability to breathe more easily.  Fewer sick days. Quitting smoking is very challenging for most people. Do not get discouraged if you are not successful the first time. Some people need to make many attempts to quit before they achieve long-term success. Do your best to stick to your quit plan, and talk with your health care provider if you have any questions or concerns.   This information is not intended to replace advice given to you by your health care provider. Make sure you discuss any questions you have with your health care provider.   Document Released: 06/17/2001 Document Revised: 11/07/2014 Document Reviewed: 11/07/2014 Elsevier Interactive Patient Education [employer]

## 2015-07-10 NOTE — Progress Notes (Signed)
Subjective:    Patient ID: Stephanie Joseph, female    DOB: 10-Mar-1961, 55 y.o.   MRN: [id number]  Chief Complaint  Patient presents with  . Establish Care    wants to check on diabetes, states that her sugar was at 127 and she felt really bad, her normal is around 140s-150s    HPI:  Stephanie Joseph is a 55 y.o. female who  has a past medical history of Hypertension; Mitral regurgitation; Depression; Gout; Anemia, iron deficiency; Dysmenorrhea; Menorrhagia; Pneumonia; Diabetes mellitus without complication ([place]); Heart valve problem; and Arthritis. and presents today for an office visit to establish care.  1.) Diabetes - Currently maintained on metformin, empagliflozin, and sitagliptin. Takes the medications as prescribed and denies adverse side effects. Did note an episode where she believes her sugar was low and she was feeling bad with a blood sugar of 127 and reports her normal average is 140-150. Modifying factor for this event included eating something and she reportedly felt better. Denies symptoms of end organ damage. Diabetic foot care is done at [facility] with the most recent being November 2016. Last eye exam was most recently completed in December 2016.   2.) Depression - Currently managed through [facility] and maintained on venlafaxine, quetiapine, and hydroxyzine. Takes her medication as prescribed and denies adverse side effects. No sucidial ideations.  3.) Hypertension - Currently managed on lorsartan, hydrochlorothiazide, and amlodipine.   BP Readings from Last 3 Encounters:  07/10/15 144/98  04/22/15 163/96  02/06/15 153/86    No Known Allergies   Outpatient Prescriptions Prior to Visit  Medication Sig Dispense Refill  . amLODipine (NORVASC) 10 MG tablet Take 10 mg by mouth daily.     . cyclobenzaprine (FLEXERIL) 10 MG tablet Take 1 tablet (10 mg total) by mouth 3 (three) times daily as needed for muscle spasms. 30 tablet 0  . diclofenac (VOLTAREN) 75 MG EC  tablet Take 1 tablet (75 mg total) by mouth 2 (two) times daily as needed for moderate pain. 30 tablet 0  . empagliflozin (JARDIANCE) 10 MG TABS tablet Take 10 mg by mouth daily.    Marland Kitchen estradiol (ESTRACE) 1 MG tablet Take 1 tablet (1 mg total) by mouth daily. 30 tablet 12  . etodolac (LODINE) 500 MG tablet Take 500 mg by mouth 2 (two) times daily as needed (pain).    . hydrochlorothiazide 25 MG tablet Take 25 mg by mouth daily.      . hydrOXYzine (ATARAX/VISTARIL) 50 MG tablet Take 50 mg by mouth 3 (three) times daily as needed for itching.    . medroxyPROGESTERone (PROVERA) 5 MG tablet Take 1 tablet (5 mg total) by mouth daily. 30 tablet 12  . metFORMIN (GLUCOPHAGE) 500 MG tablet Take 500 mg by mouth daily.     . methocarbamol (ROBAXIN) 500 MG tablet Take 500-1,000 mg by mouth every 6 (six) hours as needed for muscle spasms.    Marland Kitchen QUEtiapine (SEROQUEL) 50 MG tablet Take 50 mg by mouth at bedtime.    Marland Kitchen rOPINIRole (REQUIP) 1 MG tablet Take 1 mg by mouth daily.    . sitaGLIPtin (JANUVIA) 100 MG tablet Take 100 mg by mouth daily.    Marland Kitchen venlafaxine (EFFEXOR) 75 MG tablet Take 75 mg by mouth 3 (three) times daily with meals.    Marland Kitchen losartan (COZAAR) 25 MG tablet Take 50 mg by mouth daily.      No facility-administered medications prior to visit.     Past Medical History  Diagnosis Date  . Hypertension   . Mitral regurgitation   . Depression   . Gout   . Anemia, iron deficiency   . Dysmenorrhea   . Menorrhagia   . Pneumonia   . Diabetes mellitus without complication ([place])   . Heart valve problem   . Arthritis      Past Surgical History  Procedure Laterality Date  . Knee arthroscopy      Left     Family History  Problem Relation Age of Onset  . Heart disease Sister   . Heart disease Brother   . Hypertension Mother   . Stroke Paternal Grandmother      Social History   Social History  . Marital Status: Married    Spouse Name: N/A  . Number of Children: 1  . Years of  Education: 12   Occupational History  . Disability Pending    Social History Main Topics  . Smoking status: Current Every Day Smoker -- 0.50 packs/day for 5 years    Types: Cigarettes  . Smokeless tobacco: Never Used  . Alcohol Use: No  . Drug Use: Yes    Special: "Crack" cocaine     Comment: in the past- DENIES today  . Sexual Activity: Yes    Birth Control/ Protection: None   Other Topics Concern  . Not on file   Social History Narrative   Fun: Go to church   Denies abuse and feels safe at home.      Review of Systems  Constitutional: Negative for fever and chills.  Eyes:       Negative for changes in vision.   Respiratory: Negative for chest tightness and shortness of breath.   Cardiovascular: Negative for chest pain, palpitations and leg swelling.  Neurological: Negative for headaches.      Objective:    BP 144/98 mmHg  Pulse 77  Temp(Src) 98.3 F (36.8 C) (Oral)  Resp 18  Ht _0  (1.702 m)  Wt 215 lb (97.523 kg)  BMI 33.67 kg/m2  SpO2 98% Nursing note and vital signs reviewed.  Physical Exam  Constitutional: She is oriented to person, place, and time. She appears well-developed and well-nourished. No distress.  Cardiovascular: Normal rate, regular rhythm, normal heart sounds and intact distal pulses.   Pulmonary/Chest: Effort normal and breath sounds normal.  Neurological: She is alert and oriented to person, place, and time.  Skin: Skin is warm and dry.  Psychiatric: She has a normal mood and affect. Her behavior is normal. Judgment and thought content normal.       Assessment & Plan:   Problem List Items Addressed This Visit      Cardiovascular and Mediastinum   Essential hypertension    Blood pressure remains elevated with current regimen above goal of 140/90. Denies adverse side effects. Obtain CMET to check kidney function and electrolytes. If appropriate will increase losartan. Continue current dosage of amlodipine, hydrochlorothiazide, and  losartan pending blood work.       Relevant Orders   Comp Met (CMET)     Endocrine   Type 2 diabetes mellitus ([place]) - Primary    Obtain A1c, microalbumin, and complete metabolic panel to determine current status. Maintained on sitagliptin, empagliflozin, and metformin. Symptoms experienced unlikely related to hypoglycemia given blood sugar of 127. Continue to monitor if occurs again. Eye exam and foot exams are up to date. Maintained on losartan for CAD risk reduction. Not currently maintained on a statin. Continue current dosage of sitagliptin,  empagliflozin, and metformin pending A1c results.       Relevant Orders   Hemoglobin A1c   Microalbumin / creatinine urine ratio   Comp Met (CMET)     Other   Depression    Stable with current medication and denies adverse side effects. Mood is stable with no suicidal ideation. Management and changes per [facility]. Continue current dosage quetiapine, venlafaxine, and hydroxyzine.       Relevant Orders   Comp Met (CMET)     [id number]

## 2015-07-11 NOTE — Telephone Encounter (Signed)
i have faxed rx refill requests for amlodipine, cozaar, requip and robaxin to [facility] fax #[phone] per patient request

## 2015-07-24 NOTE — Progress Notes (Signed)
Patient ID: Stephanie Joseph, female   DOB: 1960/10/30, 55 y.o.   MRN: [id number]   55 y.o.  referred for murmur and abnormal echo 06/2014 . Reviewed echo read by Dr Lynnea Ferrier in September 2013 . He indicated severe MR with no frank prolapse or flail. LV size and funciton normal. 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 Denies appetite suppresant use, RF, SBE. 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  I reviewed the echo and thought the MR was more moderate and in the absence of symptoms or clearly repairable lesion there is no indicatoin for agressive w/u at this time Discussed this with patient including diagnosis of MR and warning signs of symptomatic progression. BP suboptimally controlled and will add Cozzaar to med list for further afterload reduction.   Echo 12/03/12  Study Conclusions  - Left ventricle: The cavity size was normal. Wall thickness was increased in a pattern of mild LVH. Systolic function was normal. The estimated ejection fraction was in the range of 50% to 55%. Wall motion was normal; there were no regional wall motion abnormalities. - Aortic valve: Mild regurgitation. Mean gradient: 8mm Hg (S). Peak gradient: 14mm Hg (S). Trivial MR   ROS: Denies fever, malais, weight loss, blurry vision, decreased visual acuity, cough, sputum, SOB, hemoptysis, pleuritic pain, palpitaitons, heartburn, abdominal pain, melena, lower extremity edema, claudication, or rash.  All other systems reviewed and negative  General: Affect appropriate Overweight black female  HEENT: normal Neck supple with no adenopathy JVP normal no bruits no thyromegaly Lungs clear with no wheezing and good diaphragmatic motion Heart:  S1/S2 mild AS/AR murmur, no rub, gallop or click PMI normal Abdomen: benighn, BS positve, no tenderness, no AAA no bruit.  No HSM or HJR Distal pulses intact with no bruits No edema Neuro non-focal Skin warm  and dry No muscular weakness   Current Outpatient Prescriptions  Medication Sig Dispense Refill  . amLODipine (NORVASC) 10 MG tablet Take 1 tablet (10 mg total) by mouth daily. 90 tablet 1  . cyclobenzaprine (FLEXERIL) 10 MG tablet Take 1 tablet (10 mg total) by mouth 3 (three) times daily as needed for muscle spasms. 30 tablet 0  . diclofenac (VOLTAREN) 75 MG EC tablet Take 1 tablet (75 mg total) by mouth 2 (two) times daily as needed for moderate pain. 30 tablet 0  . empagliflozin (JARDIANCE) 10 MG TABS tablet Take 10 mg by mouth daily.    Marland Kitchen estradiol (ESTRACE) 1 MG tablet Take 1 tablet (1 mg total) by mouth daily. 30 tablet 12  . etodolac (LODINE) 500 MG tablet Take 500 mg by mouth 2 (two) times daily as needed (pain).    . hydrochlorothiazide 25 MG tablet Take 25 mg by mouth daily.      . hydrOXYzine (ATARAX/VISTARIL) 50 MG tablet Take 50 mg by mouth 3 (three) times daily as needed for itching.    . losartan (COZAAR) 100 MG tablet Take 1 tablet (100 mg total) by mouth daily. 90 tablet 1  . medroxyPROGESTERone (PROVERA) 5 MG tablet Take 1 tablet (5 mg total) by mouth daily. 30 tablet 12  . metFORMIN (GLUCOPHAGE) 500 MG tablet Take 500 mg by mouth daily.     . methocarbamol (ROBAXIN) 500 MG tablet Take 1-2 tablets (500-1,000 mg total) by mouth every 6 (six) hours as needed for muscle spasms. 60 tablet 1  . QUEtiapine (SEROQUEL) 50 MG tablet Take 50 mg by mouth  at bedtime.    Marland Kitchen rOPINIRole (REQUIP) 1 MG tablet Take 1 tablet (1 mg total) by mouth daily. 90 tablet 1  . sitaGLIPtin (JANUVIA) 100 MG tablet Take 100 mg by mouth daily.    Marland Kitchen venlafaxine (EFFEXOR) 75 MG tablet Take 75 mg by mouth 3 (three) times daily with meals.     No current facility-administered medications for this visit.    Allergies  Review of patient's allergies indicates no known allergies.  Electrocardiogram:  5/15  SR rate 67 normal  07/27/15  SR rate 73  Normal   Assessment and Plan MR: soft murmur f/u echo today   AS/AR:  Mild f/u echo today  Bipolar: stable continue current meds WU:[id number] low carb diet.  Target hemoglobin A1c is 6.5 or less.  Continue current medications. HTN: Well controlled.  Continue current medications and low sodium Dash type diet.    F/u with me in a year if echo ok    Charlton Haws

## 2015-07-25 NOTE — Telephone Encounter (Signed)
Pt aware of results 

## 2015-07-27 ENCOUNTER — Encounter: Payer: Self-pay | Admitting: Cardiovascular Disease

## 2015-07-27 ENCOUNTER — Ambulatory Visit (INDEPENDENT_AMBULATORY_CARE_PROVIDER_SITE_OTHER): Admitting: Cardiovascular Disease

## 2015-07-27 ENCOUNTER — Other Ambulatory Visit: Payer: Self-pay

## 2015-07-27 ENCOUNTER — Ambulatory Visit (HOSPITAL_COMMUNITY): Attending: Cardiovascular Disease

## 2015-07-27 VITALS — BP 140/84 | HR 73 | Ht 67.0 in | Wt 216.4 lb

## 2015-07-27 DIAGNOSIS — F17200 Nicotine dependence, unspecified, uncomplicated: Secondary | ICD-10-CM | POA: Diagnosis not present

## 2015-07-27 DIAGNOSIS — E119 Type 2 diabetes mellitus without complications: Secondary | ICD-10-CM | POA: Diagnosis not present

## 2015-07-27 DIAGNOSIS — I10 Essential (primary) hypertension: Secondary | ICD-10-CM | POA: Insufficient documentation

## 2015-07-27 DIAGNOSIS — I340 Nonrheumatic mitral (valve) insufficiency: Secondary | ICD-10-CM | POA: Insufficient documentation

## 2015-07-27 DIAGNOSIS — I517 Cardiomegaly: Secondary | ICD-10-CM | POA: Insufficient documentation

## 2015-07-27 DIAGNOSIS — I359 Nonrheumatic aortic valve disorder, unspecified: Secondary | ICD-10-CM | POA: Insufficient documentation

## 2015-07-27 DIAGNOSIS — I351 Nonrheumatic aortic (valve) insufficiency: Secondary | ICD-10-CM | POA: Insufficient documentation

## 2015-07-27 NOTE — Patient Instructions (Signed)

## 2015-08-23 ENCOUNTER — Encounter (HOSPITAL_COMMUNITY): Payer: Self-pay | Admitting: Cardiology

## 2015-08-23 ENCOUNTER — Emergency Department (HOSPITAL_COMMUNITY)
Admission: EM | Admit: 2015-08-23 | Discharge: 2015-08-23 | Disposition: A | Discharge status: Home / Self Care | Attending: Emergency Medicine | Admitting: Emergency Medicine

## 2015-08-23 DIAGNOSIS — I10 Essential (primary) hypertension: Secondary | ICD-10-CM | POA: Insufficient documentation

## 2015-08-23 DIAGNOSIS — F17210 Nicotine dependence, cigarettes, uncomplicated: Secondary | ICD-10-CM | POA: Diagnosis not present

## 2015-08-23 DIAGNOSIS — Z8701 Personal history of pneumonia (recurrent): Secondary | ICD-10-CM | POA: Diagnosis not present

## 2015-08-23 DIAGNOSIS — F329 Major depressive disorder, single episode, unspecified: Secondary | ICD-10-CM | POA: Insufficient documentation

## 2015-08-23 DIAGNOSIS — M25552 Pain in left hip: Secondary | ICD-10-CM | POA: Insufficient documentation

## 2015-08-23 DIAGNOSIS — G8929 Other chronic pain: Secondary | ICD-10-CM | POA: Diagnosis not present

## 2015-08-23 DIAGNOSIS — E119 Type 2 diabetes mellitus without complications: Secondary | ICD-10-CM | POA: Insufficient documentation

## 2015-08-23 DIAGNOSIS — Z862 Personal history of diseases of the blood and blood-forming organs and certain disorders involving the immune mechanism: Secondary | ICD-10-CM | POA: Diagnosis not present

## 2015-08-23 DIAGNOSIS — Z79899 Other long term (current) drug therapy: Secondary | ICD-10-CM | POA: Diagnosis not present

## 2015-08-23 DIAGNOSIS — Z793 Long term (current) use of hormonal contraceptives: Secondary | ICD-10-CM | POA: Insufficient documentation

## 2015-08-23 DIAGNOSIS — M1990 Unspecified osteoarthritis, unspecified site: Secondary | ICD-10-CM | POA: Insufficient documentation

## 2015-08-23 DIAGNOSIS — M25551 Pain in right hip: Secondary | ICD-10-CM | POA: Diagnosis not present

## 2015-08-23 DIAGNOSIS — M109 Gout, unspecified: Secondary | ICD-10-CM | POA: Insufficient documentation

## 2015-08-23 MED ORDER — NAPROXEN 500 MG PO TABS: 500.0000 mg | ORAL_TABLET | Freq: Two times a day (BID) | ORAL | Status: DC

## 2015-08-23 MED ORDER — TRAMADOL HCL 50 MG PO TABS
50.0000 mg | ORAL_TABLET | Freq: Four times a day (QID) | ORAL | Status: DC | PRN
Start: 2015-08-23 — End: 2015-10-18

## 2015-08-23 NOTE — ED Notes (Signed)
Declined W/C at D/C and was escorted to lobby by RN. 

## 2015-08-23 NOTE — ED Provider Notes (Signed)
CSN: [id number]     Arrival date & time 08/23/15  0919 History  By signing my name below, I, Freida Busman, attest that this documentation has been prepared under the direction and in the presence of non-physician practitioner, Santiago Glad, PA-C. Electronically Signed: Freida Busman, Scribe. 08/23/2015. 10:50 AM.    Chief Complaint  Patient presents with  . Hip Pain  . Leg Pain    The history is provided by the patient. No language interpreter was used.     HPI Comments:  Stephanie Joseph is a 55 y.o. female with a history of DM, and gout, who presents to the Emergency Department complaining of intermittent bilateral hip and lateral thigh pain  x 1 month. She notes her pain is worse at night and for the last 2 days she has been experiencing an increase in pain in the day as well. She describes her pain as an ache with an occasional pins and needles sensation. She denies injury. She also denies fever, chills, numbness/tingling, bowel/dladder incontinence. She has been taking methocarbamol without relief. Pt also notes chronic back pain but does not believe her pain today is related to her back pain.    Past Medical History  Diagnosis Date  . Hypertension   . Mitral regurgitation   . Depression   . Gout   . Anemia, iron deficiency   . Dysmenorrhea   . Menorrhagia   . Pneumonia   . Diabetes mellitus without complication (HCC)   . Heart valve problem   . Arthritis    Past Surgical History  Procedure Laterality Date  . Knee arthroscopy      Left   Family History  Problem Relation Age of Onset  . Heart disease Sister   . Heart disease Brother   . Hypertension Mother   . Stroke Paternal Grandmother    Social History  Substance Use Topics  . Smoking status: Current Every Day Smoker -- 0.50 packs/day for 5 years    Types: Cigarettes  . Smokeless tobacco: Never Used  . Alcohol Use: No   OB History    Gravida Para Term Preterm AB TAB SAB Ectopic Multiple Living   Review of Systems  Constitutional: Negative for fever and chills.  Musculoskeletal: Positive for myalgias and back pain (chronic).  Neurological: Negative for weakness and numbness.    Allergies  Review of patient's allergies indicates no known allergies.  Home Medications   Prior to Admission medications   Medication Sig Start Date End Date Taking? Authorizing Provider  amLODipine (NORVASC) 10 MG tablet Take 1 tablet (10 mg total) by mouth daily. 07/10/15   Veryl Speak, FNP  cyclobenzaprine (FLEXERIL) 10 MG tablet Take 1 tablet (10 mg total) by mouth 3 (three) times daily as needed for muscle spasms. 02/21/14   Charm Rings, MD  diclofenac (VOLTAREN) 75 MG EC tablet Take 1 tablet (75 mg total) by mouth 2 (two) times daily as needed for moderate pain. 02/21/14   Charm Rings, MD  empagliflozin (JARDIANCE) 10 MG TABS tablet Take 10 mg by mouth daily.    Historical Provider, MD  estradiol (ESTRACE) 1 MG tablet Take 1 tablet (1 mg total) by mouth daily. 10/04/14   Adam Phenix, MD  etodolac (LODINE) 500 MG tablet Take 500 mg by mouth 2 (two) times daily as needed (pain).    Historical Provider, MD  hydrochlorothiazide 25 MG tablet Take  25 mg by mouth daily.      Historical Provider, MD  hydrOXYzine (ATARAX/VISTARIL) 50 MG tablet Take 50 mg by mouth 3 (three) times daily as needed for itching.    Historical Provider, MD  losartan (COZAAR) 100 MG tablet Take 1 tablet (100 mg total) by mouth daily. 07/10/15   Veryl Speak, FNP  medroxyPROGESTERone (PROVERA) 5 MG tablet Take 1 tablet (5 mg total) by mouth daily. 10/04/14   Adam Phenix, MD  metFORMIN (GLUCOPHAGE) 500 MG tablet Take 500 mg by mouth daily.     Historical Provider, MD  methocarbamol (ROBAXIN) 500 MG tablet Take 1-2 tablets (500-1,000 mg total) by mouth every 6 (six) hours as needed for muscle spasms. 07/10/15   Veryl Speak, FNP  QUEtiapine (SEROQUEL) 50 MG tablet Take 50 mg by mouth at bedtime.    Historical  Provider, MD  rOPINIRole (REQUIP) 1 MG tablet Take 1 tablet (1 mg total) by mouth daily. 07/10/15   Veryl Speak, FNP  sitaGLIPtin (JANUVIA) 100 MG tablet Take 100 mg by mouth daily.    Historical Provider, MD  venlafaxine (EFFEXOR) 75 MG tablet Take 75 mg by mouth 3 (three) times daily with meals.    Historical Provider, MD   BP 179/88 mmHg  Pulse 82  Temp(Src) 97.8 F (36.6 C) (Oral)  Resp 18  Wt 216 lb (97.977 kg)  SpO2 100% Physical Exam  Constitutional: She is oriented to person, place, and time. She appears well-developed and well-nourished. No distress.  HENT:  Head: Normocephalic and atraumatic.  Eyes: Conjunctivae are normal.  Neck: Normal range of motion. Neck supple.  Cardiovascular: Normal rate and regular rhythm.   Pulses:      Dorsalis pedis pulses are 2+ on the right side, and 2+ on the left side.  Pulmonary/Chest: Effort normal and breath sounds normal.  Abdominal: She exhibits no distension.  Musculoskeletal:  TTP over lumbar spine  Distal sensation in bilateral feet intact No erythema, edema, or warmth to hips or BLE TTP along IT band bilaterally; increase pain with abduction and flexion of hips bilateral  Neurological: She is alert and oriented to person, place, and time.  Reflex Scores:      Patellar reflexes are 2+ on the right side and 2+ on the left side. Skin: Skin is warm and dry.  Psychiatric: She has a normal mood and affect.  Nursing note and vitals reviewed.   ED Course  Procedures  DIAGNOSTIC STUDIES:  Oxygen Saturation is 100% on RA, normal by my interpretation.    COORDINATION OF CARE:  10:47 AM Advised to follow up with PCP.  Discussed treatment plan with pt at bedside and pt agreed to plan.   MDM   Final diagnoses:  Bilateral hip pain   Patient with bilateral hip and thigh pain.  No neurological deficits.  Patient is ambulatory.  No loss of bowel or bladder control.  No concern for cauda equina.  No fever, night sweats, weight  loss, h/o cancer, IVDA, no recent procedure to back. No urinary symptoms suggestive of UTI.  Pain appears to be musculoskeletal.  Tenderness along the IT band bilaterally.  Supportive care and return precaution discussed. Appears safe for discharge at this time. Follow up as indicated in discharge paperwork.   I personally performed the services described in this documentation, which was scribed in my presence. The recorded information has been reviewed and is accurate.    Santiago Glad, PA-C 08/23/15 1639  Gerhard Munch, MD  08/24/15 1608 

## 2015-08-23 NOTE — ED Notes (Signed)
Reports bilateral hip and leg pain for the past week or so. Denies any hx of the same. Denies any injury.

## 2015-08-27 ENCOUNTER — Ambulatory Visit

## 2015-08-27 VITALS — BP 144/94

## 2015-08-27 DIAGNOSIS — Z0130 Encounter for examination of blood pressure without abnormal findings: Secondary | ICD-10-CM

## 2015-08-27 NOTE — Progress Notes (Signed)
Pt confirms that she is taking the amplodipine 10 mg daily, losartan 100 mg daily and the HCTZ 25 mg daily. BP reading was taken first on the left arm, sitting position. Today it was still elevated but patient stated that she has not taken her BP meds today.   Wanted 5 minutes and retook BP using the right arm, sitting position and it was relatively close to the same reading.

## 2015-09-28 DIAGNOSIS — B351 Tinea unguium: Secondary | ICD-10-CM

## 2015-10-08 ENCOUNTER — Other Ambulatory Visit: Payer: Self-pay | Admitting: Obstetrics & Gynecology

## 2015-10-10 ENCOUNTER — Other Ambulatory Visit: Payer: Self-pay | Admitting: Obstetrics & Gynecology

## 2015-10-10 ENCOUNTER — Telehealth: Payer: Self-pay

## 2015-10-10 NOTE — Telephone Encounter (Signed)
Patient called needing refills on her medications she needs Provera 5mg  and Estrace 1 mg both have expired. Please call her and let her know when it's been called in. Thanks!

## 2015-10-10 NOTE — Telephone Encounter (Signed)
Please scheduled this patient for appointment she no show her appointment

## 2015-10-18 ENCOUNTER — Ambulatory Visit (INDEPENDENT_AMBULATORY_CARE_PROVIDER_SITE_OTHER): Admitting: Family

## 2015-10-18 ENCOUNTER — Encounter: Payer: Self-pay | Admitting: Family

## 2015-10-18 VITALS — BP 140/74 | HR 70 | Temp 98.3°F | Resp 16 | Ht 67.0 in | Wt 215.0 lb

## 2015-10-18 DIAGNOSIS — M545 Low back pain: Secondary | ICD-10-CM

## 2015-10-18 DIAGNOSIS — M549 Dorsalgia, unspecified: Secondary | ICD-10-CM | POA: Insufficient documentation

## 2015-10-18 MED ORDER — TRAMADOL HCL 50 MG PO TABS: 50.0000 mg | ORAL_TABLET | Freq: Four times a day (QID) | ORAL | Status: DC | PRN

## 2015-10-18 MED ORDER — MELOXICAM 15 MG PO TABS: 15.0000 mg | ORAL_TABLET | Freq: Every day | ORAL | Status: DC

## 2015-10-18 MED ORDER — METHOCARBAMOL 500 MG PO TABS: 1000.0000 mg | ORAL_TABLET | Freq: Three times a day (TID) | ORAL | Status: DC | PRN

## 2015-10-18 NOTE — Patient Instructions (Signed)
Thank you for choosing [facility].  Summary/Instructions:  Ice 2-3 times per day. Stretches daily. They will call to schedule physical therapy.   Your prescription(s) have been submitted to your pharmacy or been printed and provided for you. Please take as directed and contact our office if you believe you are having problem(s) with the medication(s) or have any questions.  If your symptoms worsen or fail to improve, please contact our office for further instruction, or in case of emergency go directly to the emergency room at the closest medical facility.    Low Back Sprain With Rehab A sprain is an injury in which a ligament is torn. The ligaments of the lower back are vulnerable to sprains. However, they are strong and require great force to be injured. These ligaments are important for stabilizing the spinal column. Sprains are classified into three categories. Grade 1 sprains cause pain, but the tendon is not lengthened. Grade 2 sprains include a lengthened ligament, due to the ligament being stretched or partially ruptured. With grade 2 sprains there is still function, although the function may be decreased. Grade 3 sprains involve a complete tear of the tendon or muscle, and function is usually impaired. SYMPTOMS   Severe pain in the lower back.  Sometimes, a feeling of a "pop," "snap," or tear, at the time of injury.  Tenderness and sometimes swelling at the injury site.  Uncommonly, bruising (contusion) within 48 hours of injury.  Muscle spasms in the back. CAUSES  Low back sprains occur when a force is placed on the ligaments that is greater than they can handle. Common causes of injury include:  Performing a stressful act while off-balance.  Repetitive stressful activities that involve movement of the lower back.  Direct hit (trauma) to the lower back. RISK INCREASES WITH:  Contact sports (football, wrestling).  Collisions (major skiing accidents).  Sports  that require throwing or lifting (baseball, weightlifting).  Sports involving twisting of the spine (gymnastics, diving, tennis, golf).  Poor strength and flexibility.  Inadequate protection.  Previous back injury or surgery (especially fusion). PREVENTION  Wear properly fitted and padded protective equipment.  Warm up and stretch properly before activity.  Allow for adequate recovery between workouts.  Maintain physical fitness:  Strength, flexibility, and endurance.  Cardiovascular fitness.  Maintain a healthy body weight. PROGNOSIS  If treated properly, low back sprains usually heal with non-surgical treatment. The length of time for healing depends on the severity of the injury.  RELATED COMPLICATIONS   Recurring symptoms, resulting in a chronic problem.  Chronic inflammation and pain in the low back.  Delayed healing or resolution of symptoms, especially if activity is resumed too soon.  Prolonged impairment.  Unstable or arthritic joints of the low back. TREATMENT  Treatment first involves the use of ice and medicine, to reduce pain and inflammation. The use of strengthening and stretching exercises may help reduce pain with activity. These exercises may be performed at home or with a therapist. Severe injuries may require referral to a therapist for further evaluation and treatment, such as ultrasound. Your caregiver may advise that you wear a back brace or corset, to help reduce pain and discomfort. Often, prolonged bed rest results in greater harm then benefit. Corticosteroid injections may be recommended. However, these should be reserved for the most serious cases. It is important to avoid using your back when lifting objects. At night, sleep on your back on a firm mattress, with a pillow placed under your knees. If  non-surgical treatment is unsuccessful, surgery may be needed.  MEDICATION   If pain medicine is needed, nonsteroidal anti-inflammatory medicines  (aspirin and ibuprofen), or other minor pain relievers (acetaminophen), are often advised.  Do not take pain medicine for 7 days before surgery.  Prescription pain relievers may be given, if your caregiver thinks they are needed. Use only as directed and only as much as you need.  Ointments applied to the skin may be helpful.  Corticosteroid injections may be given by your caregiver. These injections should be reserved for the most serious cases, because they may only be given a certain number of times. HEAT AND COLD  Cold treatment (icing) should be applied for 10 to 15 minutes every 2 to 3 hours for inflammation and pain, and immediately after activity that aggravates your symptoms. Use ice packs or an ice massage.  Heat treatment may be used before performing stretching and strengthening activities prescribed by your caregiver, physical therapist, or athletic trainer. Use a heat pack or a warm water soak. SEEK MEDICAL CARE IF:   Symptoms get worse or do not improve in 2 to 4 weeks, despite treatment.  You develop numbness or weakness in either leg.  You lose bowel or bladder function.  Any of the following occur after surgery: fever, increased pain, swelling, redness, drainage of fluids, or bleeding in the affected area.  New, unexplained symptoms develop. (Drugs used in treatment may produce side effects.) EXERCISES  RANGE OF MOTION (ROM) AND STRETCHING EXERCISES - Low Back Sprain Most people with lower back pain will find that their symptoms get worse with excessive bending forward (flexion) or arching at the lower back (extension). The exercises that will help resolve your symptoms will focus on the opposite motion.  Your physician, physical therapist or athletic trainer will help you determine which exercises will be most helpful to resolve your lower back pain. Do not complete any exercises without first consulting with your caregiver. Discontinue any exercises which make your  symptoms worse, until you speak to your caregiver. If you have pain, numbness or tingling which travels down into your buttocks, leg or foot, the goal of the therapy is for these symptoms to move closer to your back and eventually resolve. Sometimes, these leg symptoms will get better, but your lower back pain may worsen. This is often an indication of progress in your rehabilitation. Be very alert to any changes in your symptoms and the activities in which you participated in the 24 hours prior to the change. Sharing this information with your caregiver will allow him or her to most efficiently treat your condition. These exercises may help you when beginning to rehabilitate your injury. Your symptoms may resolve with or without further involvement from your physician, physical therapist or athletic trainer. While completing these exercises, remember:   Restoring tissue flexibility helps normal motion to return to the joints. This allows healthier, less painful movement and activity.  An effective stretch should be held for at least 30 seconds.  A stretch should never be painful. You should only feel a gentle lengthening or release in the stretched tissue. FLEXION RANGE OF MOTION AND STRETCHING EXERCISES: STRETCH - Flexion, Single Knee to Chest   Lie on a firm bed or floor with both legs extended in front of you.  Keeping one leg in contact with the floor, bring your opposite knee to your chest. Hold your leg in place by either grabbing behind your thigh or at your knee.  Pull until  you feel a gentle stretch in your low back. Hold __________ seconds.  Slowly release your grasp and repeat the exercise with the opposite side. Repeat __________ times. Complete this exercise __________ times per day.  STRETCH - Flexion, Double Knee to Chest  Lie on a firm bed or floor with both legs extended in front of you.  Keeping one leg in contact with the floor, bring your opposite knee to your  chest.  Tense your stomach muscles to support your back and then lift your other knee to your chest. Hold your legs in place by either grabbing behind your thighs or at your knees.  Pull both knees toward your chest until you feel a gentle stretch in your low back. Hold __________ seconds.  Tense your stomach muscles and slowly return one leg at a time to the floor. Repeat __________ times. Complete this exercise __________ times per day.  STRETCH - Low Trunk Rotation  Lie on a firm bed or floor. Keeping your legs in front of you, bend your knees so they are both pointed toward the ceiling and your feet are flat on the floor.  Extend your arms out to the side. This will stabilize your upper body by keeping your shoulders in contact with the floor.  Gently and slowly drop both knees together to one side until you feel a gentle stretch in your low back. Hold for __________ seconds.  Tense your stomach muscles to support your lower back as you bring your knees back to the starting position. Repeat the exercise to the other side. Repeat __________ times. Complete this exercise __________ times per day  EXTENSION RANGE OF MOTION AND FLEXIBILITY EXERCISES: STRETCH - Extension, Prone on Elbows   Lie on your stomach on the floor, a bed will be too soft. Place your palms about shoulder width apart and at the height of your head.  Place your elbows under your shoulders. If this is too painful, stack pillows under your chest.  Allow your body to relax so that your hips drop lower and make contact more completely with the floor.  Hold this position for __________ seconds.  Slowly return to lying flat on the floor. Repeat __________ times. Complete this exercise __________ times per day.  RANGE OF MOTION - Extension, Prone Press Ups  Lie on your stomach on the floor, a bed will be too soft. Place your palms about shoulder width apart and at the height of your head.  Keeping your back as relaxed  as possible, slowly straighten your elbows while keeping your hips on the floor. You may adjust the placement of your hands to maximize your comfort. As you gain motion, your hands will come more underneath your shoulders.  Hold this position __________ seconds.  Slowly return to lying flat on the floor. Repeat __________ times. Complete this exercise __________ times per day.  RANGE OF MOTION- Quadruped, Neutral Spine   Assume a hands and knees position on a firm surface. Keep your hands under your shoulders and your knees under your hips. You may place padding under your knees for comfort.  Drop your head and point your tailbone toward the ground below you. This will round out your lower back like an angry cat. Hold this position for __________ seconds.  Slowly lift your head and release your tail bone so that your back sags into a large arch, like an old horse.  Hold this position for __________ seconds.  Repeat this until you feel limber in  your low back.  Now, find your "sweet spot." This will be the most comfortable position somewhere between the two previous positions. This is your neutral spine. Once you have found this position, tense your stomach muscles to support your low back.  Hold this position for __________ seconds. Repeat __________ times. Complete this exercise __________ times per day.  STRENGTHENING EXERCISES - Low Back Sprain These exercises may help you when beginning to rehabilitate your injury. These exercises should be done near your "sweet spot." This is the neutral, low-back arch, somewhere between fully rounded and fully arched, that is your least painful position. When performed in this safe range of motion, these exercises can be used for people who have either a flexion or extension based injury. These exercises may resolve your symptoms with or without further involvement from your physician, physical therapist or athletic trainer. While completing these  exercises, remember:   Muscles can gain both the endurance and the strength needed for everyday activities through controlled exercises.  Complete these exercises as instructed by your physician, physical therapist or athletic trainer. Increase the resistance and repetitions only as guided.  You may experience muscle soreness or fatigue, but the pain or discomfort you are trying to eliminate should never worsen during these exercises. If this pain does worsen, stop and make certain you are following the directions exactly. If the pain is still present after adjustments, discontinue the exercise until you can discuss the trouble with your caregiver. STRENGTHENING - Deep Abdominals, Pelvic Tilt   Lie on a firm bed or floor. Keeping your legs in front of you, bend your knees so they are both pointed toward the ceiling and your feet are flat on the floor.  Tense your lower abdominal muscles to press your low back into the floor. This motion will rotate your pelvis so that your tail bone is scooping upwards rather than pointing at your feet or into the floor. With a gentle tension and even breathing, hold this position for __________ seconds. Repeat __________ times. Complete this exercise __________ times per day.  STRENGTHENING - Abdominals, Crunches   Lie on a firm bed or floor. Keeping your legs in front of you, bend your knees so they are both pointed toward the ceiling and your feet are flat on the floor. Cross your arms over your chest.  Slightly tip your chin down without bending your neck.  Tense your abdominals and slowly lift your trunk high enough to just clear your shoulder blades. Lifting higher can put excessive stress on the lower back and does not further strengthen your abdominal muscles.  Control your return to the starting position. Repeat __________ times. Complete this exercise __________ times per day.  STRENGTHENING - Quadruped, Opposite UE/LE Lift   Assume a hands and  knees position on a firm surface. Keep your hands under your shoulders and your knees under your hips. You may place padding under your knees for comfort.  Find your neutral spine and gently tense your abdominal muscles so that you can maintain this position. Your shoulders and hips should form a rectangle that is parallel with the floor and is not twisted.  Keeping your trunk steady, lift your right hand no higher than your shoulder and then your left leg no higher than your hip. Make sure you are not holding your breath. Hold this position for __________ seconds.  Continuing to keep your abdominal muscles tense and your back steady, slowly return to your starting position. Repeat with the  opposite arm and leg. Repeat __________ times. Complete this exercise __________ times per day.  STRENGTHENING - Abdominals and Quadriceps, Straight Leg Raise   Lie on a firm bed or floor with both legs extended in front of you.  Keeping one leg in contact with the floor, bend the other knee so that your foot can rest flat on the floor.  Find your neutral spine, and tense your abdominal muscles to maintain your spinal position throughout the exercise.  Slowly lift your straight leg off the floor about 6 inches for a count of 15, making sure to not hold your breath.  Still keeping your neutral spine, slowly lower your leg all the way to the floor. Repeat this exercise with each leg __________ times. Complete this exercise __________ times per day. POSTURE AND BODY MECHANICS CONSIDERATIONS - Low Back Sprain Keeping correct posture when sitting, standing or completing your activities will reduce the stress put on different body tissues, allowing injured tissues a chance to heal and limiting painful experiences. The following are general guidelines for improved posture. Your physician or physical therapist will provide you with any instructions specific to your needs. While reading these guidelines,  remember:  The exercises prescribed by your provider will help you have the flexibility and strength to maintain correct postures.  The correct posture provides the best environment for your joints to work. All of your joints have less wear and tear when properly supported by a spine with good posture. This means you will experience a healthier, less painful body.  Correct posture must be practiced with all of your activities, especially prolonged sitting and standing. Correct posture is as important when doing repetitive low-stress activities (typing) as it is when doing a single heavy-load activity (lifting). RESTING POSITIONS Consider which positions are most painful for you when choosing a resting position. If you have pain with flexion-based activities (sitting, bending, stooping, squatting), choose a position that allows you to rest in a less flexed posture. You would want to avoid curling into a fetal position on your side. If your pain worsens with extension-based activities (prolonged standing, working overhead), avoid resting in an extended position such as sleeping on your stomach. Most people will find more comfort when they rest with their spine in a more neutral position, neither too rounded nor too arched. Lying on a non-sagging bed on your side with a pillow between your knees, or on your back with a pillow under your knees will often provide some relief. Keep in mind, being in any one position for a prolonged period of time, no matter how correct your posture, can still lead to stiffness. PROPER SITTING POSTURE In order to minimize stress and discomfort on your spine, you must sit with correct posture. Sitting with good posture should be effortless for a healthy body. Returning to good posture is a gradual process. Many people can work toward this most comfortably by using various supports until they have the flexibility and strength to maintain this posture on their own. When sitting  with proper posture, your ears will fall over your shoulders and your shoulders will fall over your hips. You should use the back of the chair to support your upper back. Your lower back will be in a neutral position, just slightly arched. You may place a small pillow or folded towel at the base of your lower back for  support.  When working at a desk, create an environment that supports good, upright posture. Without extra support, muscles  tire, which leads to excessive strain on joints and other tissues. Keep these recommendations in mind: CHAIR:  A chair should be able to slide under your desk when your back makes contact with the back of the chair. This allows you to work closely.  The chair's height should allow your eyes to be level with the upper part of your monitor and your hands to be slightly lower than your elbows. BODY POSITION  Your feet should make contact with the floor. If this is not possible, use a foot rest.  Keep your ears over your shoulders. This will reduce stress on your neck and low back. INCORRECT SITTING POSTURES  If you are feeling tired and unable to assume a healthy sitting posture, do not slouch or slump. This puts excessive strain on your back tissues, causing more damage and pain. Healthier options include:  Using more support, like a lumbar pillow.  Switching tasks to something that requires you to be upright or walking.  Talking a brief walk.  Lying down to rest in a neutral-spine position. PROLONGED STANDING WHILE SLIGHTLY LEANING FORWARD  When completing a task that requires you to lean forward while standing in one place for a long time, place either foot up on a stationary 2-4 inch high object to help maintain the best posture. When both feet are on the ground, the lower back tends to lose its slight inward curve. If this curve flattens (or becomes too large), then the back and your other joints will experience too much stress, tire more quickly, and  can cause pain. CORRECT STANDING POSTURES Proper standing posture should be assumed with all daily activities, even if they only take a few moments, like when brushing your teeth. As in sitting, your ears should fall over your shoulders and your shoulders should fall over your hips. You should keep a slight tension in your abdominal muscles to brace your spine. Your tailbone should point down to the ground, not behind your body, resulting in an over-extended swayback posture.  INCORRECT STANDING POSTURES  Common incorrect standing postures include a forward head, locked knees and/or an excessive swayback. WALKING Walk with an upright posture. Your ears, shoulders and hips should all line-up. PROLONGED ACTIVITY IN A FLEXED POSITION When completing a task that requires you to bend forward at your waist or lean over a low surface, try to find a way to stabilize 3 out of 4 of your limbs. You can place a hand or elbow on your thigh or rest a knee on the surface you are reaching across. This will provide you more stability, so that your muscles do not tire as quickly. By keeping your knees relaxed, or slightly bent, you will also reduce stress across your lower back. CORRECT LIFTING TECHNIQUES DO :  Assume a wide stance. This will provide you more stability and the opportunity to get as close as possible to the object which you are lifting.  Tense your abdominals to brace your spine. Bend at the knees and hips. Keeping your back locked in a neutral-spine position, lift using your leg muscles. Lift with your legs, keeping your back straight.  Test the weight of unknown objects before attempting to lift them.  Try to keep your elbows locked down at your sides in order get the best strength from your shoulders when carrying an object.  Always ask for help when lifting heavy or awkward objects. INCORRECT LIFTING TECHNIQUES DO NOT:   Lock your knees when lifting, even if  it is a small object.  Bend  and twist. Pivot at your feet or move your feet when needing to change directions.  Assume that you can safely pick up even a paperclip without proper posture.   This information is not intended to replace advice given to you by your health care provider. Make sure you discuss any questions you have with your health care provider.   Document Released: 06/23/2005 Document Revised: 07/14/2014 Document Reviewed: 10/05/2008 [employer] Interactive Patient Education [employer]

## 2015-10-18 NOTE — Assessment & Plan Note (Signed)
Low back pain with previous spondylolysis noted on x-ray. Symptoms and exam consistent with muscle tightness/muscle imbalance and possibly from spondylolysis. Increase methocarbamol. Refill tramadol as needed for pain. Start meloxicam. Refer for physical therapy. If symptoms worsen or do not improve consider further advanced imaging and possible referral

## 2015-10-18 NOTE — Progress Notes (Signed)
Pre visit review using our clinic review tool, if applicable. No additional management support is needed unless otherwise documented below in the visit note. 

## 2015-10-18 NOTE — Progress Notes (Signed)
Subjective:    Patient ID: Stephanie Joseph, female    DOB: Mar 04, 1961, 55 y.o.   MRN: [id number]  Chief Complaint  Patient presents with  . Hip Pain    has pain that starts at hips and goes around the back side at her lower back/buttocks area    HPI:  Stephanie Humblengela Bellavance is a 55 y.o. female who  has a past medical history of Hypertension; Mitral regurgitation; Depression; Gout; Anemia, iron deficiency; Dysmenorrhea; Menorrhagia; Pneumonia; Diabetes mellitus without complication (HCC); Heart valve problem; and Arthritis. and presents today For an office visit.  This is a new problem. Associated symptom of pain located in her lower back has been going on for a couple of months. Pain is described as achy with a severity of 8/10. Describes radiculopathy to both legs. Denies numbness or tingling. Denies trauma. Modifying factors include tramadol, naproxen, and Robaxin which does not help very much. The pain does effect her ADLs. Denies saddle anesthesia or changes to bowel/bladder habits. Course of the symptoms is progressively worsening. Previous imaging showed spondylosis without any acute osseous findings.    No Known Allergies   Current Outpatient Prescriptions on File Prior to Visit  Medication Sig Dispense Refill  . amLODipine (NORVASC) 10 MG tablet Take 1 tablet (10 mg total) by mouth daily. 90 tablet 1  . empagliflozin (JARDIANCE) 10 MG TABS tablet Take 10 mg by mouth daily.    Marland Kitchen. estradiol (ESTRACE) 1 MG tablet TAKE ONE TABLET BY MOUTH ONCE DAILY 30 tablet 0  . hydrochlorothiazide 25 MG tablet Take 25 mg by mouth daily.      . hydrOXYzine (ATARAX/VISTARIL) 50 MG tablet Take 50 mg by mouth 3 (three) times daily as needed for itching.    . losartan (COZAAR) 100 MG tablet Take 1 tablet (100 mg total) by mouth daily. 90 tablet 1  . medroxyPROGESTERone (PROVERA) 5 MG tablet TAKE ONE TABLET BY MOUTH ONCE DAILY 30 tablet 0  . metFORMIN (GLUCOPHAGE) 500 MG tablet Take 500 mg by mouth daily.       . QUEtiapine (SEROQUEL) 50 MG tablet Take 50 mg by mouth at bedtime.    Marland Kitchen. rOPINIRole (REQUIP) 1 MG tablet Take 1 tablet (1 mg total) by mouth daily. 90 tablet 1  . sitaGLIPtin (JANUVIA) 100 MG tablet Take 100 mg by mouth daily.    Marland Kitchen. venlafaxine (EFFEXOR) 75 MG tablet Take 75 mg by mouth 3 (three) times daily with meals.     No current facility-administered medications on file prior to visit.    Past Medical History  Diagnosis Date  . Hypertension   . Mitral regurgitation   . Depression   . Gout   . Anemia, iron deficiency   . Dysmenorrhea   . Menorrhagia   . Pneumonia   . Diabetes mellitus without complication (HCC)   . Heart valve problem   . Arthritis      Review of Systems  Constitutional: Negative for fever and chills.  Cardiovascular: Negative for chest pain, palpitations and leg swelling.  Musculoskeletal: Positive for back pain.  Neurological: Negative for weakness and numbness.      Objective:    BP 140/74 mmHg  Pulse 70  Temp(Src) 98.3 F (36.8 C) (Oral)  Resp 16  Ht 5\' 7"  (1.702 m)  Wt 215 lb (97.523 kg)  BMI 33.67 kg/m2  SpO2 98% Nursing note and vital signs reviewed.  Physical Exam  Constitutional: She is oriented to person, place, and time. She appears well-developed and  well-nourished. No distress.  Cardiovascular: Normal rate, regular rhythm, normal heart sounds and intact distal pulses.   Pulmonary/Chest: Effort normal and breath sounds normal.  Musculoskeletal:  Low back - no obvious deformity, discoloration, or edema noted. Mildly increased lordotic curve. Palpable tenderness lower lumbar spine midline along L4-L5. Range of motion is within normal limits with discomfort noted in flexion and extension. All other motions are normal. Negative straight leg raise. Negative Faber's. Distal pulses and sensation are intact and appropriate.  Neurological: She is alert and oriented to person, place, and time.  Skin: Skin is warm and dry.  Psychiatric: She  has a normal mood and affect. Her behavior is normal. Judgment and thought content normal.       Assessment & Plan:   Problem List Items Addressed This Visit      Other   Back pain - Primary    Low back pain with previous spondylolysis noted on x-ray. Symptoms and exam consistent with muscle tightness/muscle imbalance and possibly from spondylolysis. Increase methocarbamol. Refill tramadol as needed for pain. Start meloxicam. Refer for physical therapy. If symptoms worsen or do not improve consider further advanced imaging and possible referral      Relevant Medications   methocarbamol (ROBAXIN) 500 MG tablet   meloxicam (MOBIC) 15 MG tablet   traMADol (ULTRAM) 50 MG tablet   Other Relevant Orders   Ambulatory referral to Physical Therapy       I have discontinued Ms. Bouza's cyclobenzaprine, diclofenac, etodolac, and naproxen. I have also changed her methocarbamol. Additionally, I am having her start on meloxicam. Lastly, I am having her maintain her hydrochlorothiazide, hydrOXYzine, venlafaxine, metFORMIN, QUEtiapine, sitaGLIPtin, empagliflozin, amLODipine, losartan, rOPINIRole, estradiol, medroxyPROGESTERone, and traMADol.   Meds ordered this encounter  Medications  . methocarbamol (ROBAXIN) 500 MG tablet    Sig: Take 2 tablets (1,000 mg total) by mouth 3 (three) times daily as needed for muscle spasms.    Dispense:  90 tablet    Refill:  0    Order Specific Question:  Supervising Provider    Answer:  Hillard Danker A [[id number]]  . meloxicam (MOBIC) 15 MG tablet    Sig: Take 1 tablet (15 mg total) by mouth daily.    Dispense:  30 tablet    Refill:  0    Order Specific Question:  Supervising Provider    Answer:  Hillard Danker A [[id number]]  . traMADol (ULTRAM) 50 MG tablet    Sig: Take 1 tablet (50 mg total) by mouth every 6 (six) hours as needed.    Dispense:  120 tablet    Refill:  0    Order Specific Question:  Supervising Provider    Answer:  Hillard Danker A [[id number]]     Follow-up: Return in about 3 weeks (around 11/08/2015).  Jeanine Luz, FNP

## 2015-11-05 ENCOUNTER — Encounter: Payer: Self-pay | Admitting: Physical Therapy

## 2015-11-05 ENCOUNTER — Ambulatory Visit: Attending: Family | Admitting: Physical Therapy

## 2015-11-05 DIAGNOSIS — M545 Low back pain: Secondary | ICD-10-CM | POA: Diagnosis not present

## 2015-11-05 DIAGNOSIS — R262 Difficulty in walking, not elsewhere classified: Secondary | ICD-10-CM | POA: Insufficient documentation

## 2015-11-05 DIAGNOSIS — M6281 Muscle weakness (generalized): Secondary | ICD-10-CM | POA: Diagnosis present

## 2015-11-05 NOTE — Therapy (Signed)
[facility] Outpatient Rehabilitation [facility] [address] [place], [place], [address] Phone: [phone]   Fax:  [phone]  Physical Therapy Evaluation  Patient Details  Name: Stephanie Joseph MRN: [id number] Date of Birth: 07/20/1960 Referring Provider: Veryl Speak  Encounter Date: 11/05/2015      PT End of Session - 11/05/15 1318    Visit Number 1   Number of Visits 17   Date for PT Re-Evaluation 12/31/15   Authorization Type [employer], PT only   PT Start Time 0930   PT Stop Time 1013   PT Time Calculation (min) 43 min   Activity Tolerance Patient tolerated treatment well   Behavior During Therapy [facility] for tasks assessed/performed      Past Medical History  Diagnosis Date  . Hypertension   . Mitral regurgitation   . Depression   . Gout   . Anemia, iron deficiency   . Dysmenorrhea   . Menorrhagia   . Pneumonia   . Diabetes mellitus without complication (HCC)   . Heart valve problem   . Arthritis     Past Surgical History  Procedure Laterality Date  . Knee arthroscopy      Left    There were no vitals filed for this visit.       Subjective Assessment - 11/05/15 0935    Subjective Bilateral radicular pain to anterior shins. Insidous onset over a few years. Recently become worse   How long can you sit comfortably? 20 min   How long can you stand comfortably? 20 min   How long can you walk comfortably? 20 min   Currently in Pain? Yes   Pain Score 8    Pain Location Back   Pain Orientation Lower;Left;Right   Pain Descriptors / Indicators Aching   Pain Radiating Towards into backside, down bilateral lateral thighs to anterior shins   Pain Onset More than a month ago   Aggravating Factors  standing, walking   Pain Relieving Factors medication, ice pack            [facility] PT Assessment - 11/05/15 0001    Assessment   Medical Diagnosis LBP with sciatica   Referring Provider Tama Headings Calone   Onset Date/Surgical Date --  no  surgical intervention   Hand Dominance Right   Next MD Visit no   Prior Therapy 2016 and approximately 3 years ago   Precautions   Precautions None   Restrictions   Weight Bearing Restrictions No   Balance Screen   Has the patient fallen in the past 6 months No   Home Environment   Living Environment Private residence   Available Help at Discharge Family   Type of Home House   Home Access Stairs to enter   Entrance Stairs-Number of Steps 3   Prior Function   Level of Independence Independent   Cognition   Overall Cognitive Status Within Functional Limits for tasks assessed   Observation/Other Assessments   Focus on Therapeutic Outcomes (FOTO)  31% ability   ROM / Strength   AROM / PROM / Strength AROM;Strength   AROM   AROM Assessment Site Lumbar   Lumbar Flexion 30  inclinometer at L4, pain, UE assist to stand   Lumbar Extension 0  Inclinometer at L4, no pain   Strength   Strength Assessment Site Hip   Right/Left Hip Right;Left   Right Hip Flexion 3+/5   Right Hip Extension 3/5  resisted in supine   Right Hip ABduction 3/5  Left Hip Flexion 3+/5   Left Hip Extension 3/5   Left Hip ABduction 3+/5   Palpation   Spinal mobility limited, pain at L5 with P-A   SI assessment  limited mobility with pain upon anterior and post tilt, pain to P-A R and L SIJ ( R SIJ more painful than L)   Palpation comment TTP bilat greater trochanter; tight ITband bilat without TTP at distal attachment                   [facility] Adult PT Treatment/Exercise - 11/05/15 0001    Modalities   Modalities Cryotherapy   Cryotherapy   Number Minutes Cryotherapy 10 Minutes  concurrent with discussion regarding plan of care and HEP   Cryotherapy Location Lumbar Spine  in prone on elbows   Type of Cryotherapy Ice pack                PT Education - 11/05/15 1317    Education provided Yes   Education Details anatomy of condition, plan of care, HEP   Person(s) Educated Patient    Methods Explanation;Tactile cues;Verbal cues   Comprehension Verbalized understanding;Verbal cues required;Tactile cues required;Need further instruction          PT Short Term Goals - 11/05/15 1324    PT SHORT TERM GOAL #1   Title Pt will report average pain <5/10 by 5/26   Time 4   Period Weeks   Status New   PT SHORT TERM GOAL #2   Title Verbalize improved sleeping ability   Time 4   Period Weeks   Status New   PT SHORT TERM GOAL #3   Title Ability to control pain with postural positioning and use of HEP   Time 4   Period Weeks   Status New           PT Long Term Goals - 11/05/15 1322    PT LONG TERM GOAL #1   Title FOTO to 48% ability by 6/26   Time 8   Period Weeks   Status New   PT LONG TERM GOAL #2   Title Pt will be able to sleep being woken <1 per night due to pain   Time 8   Period Weeks   Status New   PT LONG TERM GOAL #3   Title Pt will be able to return to cooking with miniml impairment    Time 8   Period Weeks   Status New   PT LONG TERM GOAL #4   Title Pt will be able to perform HHC with minimal impairment   Time 8   Period Weeks   Status New   PT LONG TERM GOAL #5   Title Pt will be able to usher at church pain <3/10   Time 8   Period Weeks   Status New               Plan - 11/05/15 1004    Clinical Impression Statement Pt presents with complaints of LBP and radicular symptoms bilaterally. Notable TTP at L5 as well as bilat SIJ created concordant pain. Improved symptoms in extension. Pt will benefit from skilled PT in order to improve strength and endurance in upright posture to return to functional acitivites.    Rehab Potential Good   PT Frequency 2x / week   PT Duration 8 weeks   PT Treatment/Interventions ADLs/Self Care Home Management;Cryotherapy;Electrical Stimulation;Iontophoresis 4mg /ml Dexamethasone;Moist Heat;Therapeutic exercise;Therapeutic activities;Functional mobility training;Stair training;Gait  training;Traction;Balance training;Neuromuscular  re-education;Patient/family education;Manual techniques;Taping;Dry needling;Passive range of motion   PT Next Visit Plan repeated extensions in prone, prone glut sets, swimmers   PT Home Exercise Plan prone on elbows 5 min before getting out of bed and before going to sleep, standing extensions at counter PRN for pain   Consulted and Agree with Plan of Care Patient      Patient will benefit from skilled therapeutic intervention in order to improve the following deficits and impairments:  Decreased range of motion, Difficulty walking, Decreased activity tolerance, Pain, Improper body mechanics, Decreased strength, Postural dysfunction  Visit Diagnosis: Bilateral low back pain, with sciatica presence unspecified - Plan: PT plan of care cert/re-cert  Difficulty in walking, not elsewhere classified - Plan: PT plan of care cert/re-cert  Muscle weakness (generalized) - Plan: PT plan of care cert/re-cert      G-Codes - Nov 14, 2015 1327    Functional Assessment Tool Used FOTO(69% impairment), clnical judgement   Functional Limitation Mobility: Walking and moving around   Mobility: Walking and Moving Around Current Status [phone]) At least 60 percent but less than 80 percent impaired, limited or restricted   Mobility: Walking and Moving Around Goal Status [phone]) At least 40 percent but less than 60 percent impaired, limited or restricted       Problem List Patient Active Problem List   Diagnosis Date Noted  . Back pain 10/18/2015  . Type 2 diabetes mellitus (HCC) 07/10/2015  . Hormone replacement therapy (postmenopausal) 10/04/2014  . Mitral regurgitation 11/05/2012  . Aortic valve disease 10/21/2011  . Menorrhagia, premenopausal 09/01/2011  . Depression 03/04/2010  . GOUT 12/16/2006  . ANEMIA, IRON DEFICIENCY NOS 12/16/2006  . TOBACCO ABUSE 12/16/2006  . Essential hypertension 12/16/2006   Maziyah Vessel C. Konstantin Lehnen PT, DPT Nov 14, 2015 2:40  PM   [facility] [address] [place], [place], [address] Phone: [phone]   Fax:  [phone]  Name: Stephanie Joseph MRN: [id number] Date of Birth: [birth year]

## 2015-11-06 ENCOUNTER — Other Ambulatory Visit: Payer: Self-pay | Admitting: Obstetrics & Gynecology

## 2015-11-13 ENCOUNTER — Ambulatory Visit: Admitting: Physical Therapy

## 2015-11-13 ENCOUNTER — Encounter: Payer: Self-pay | Admitting: Physical Therapy

## 2015-11-13 DIAGNOSIS — M6281 Muscle weakness (generalized): Secondary | ICD-10-CM

## 2015-11-13 DIAGNOSIS — M545 Low back pain: Secondary | ICD-10-CM

## 2015-11-13 DIAGNOSIS — R262 Difficulty in walking, not elsewhere classified: Secondary | ICD-10-CM

## 2015-11-13 NOTE — Therapy (Signed)
[facility] Outpatient Rehabilitation [facility] [address] [place], [place], [address] Phone: [phone]   Fax:  [phone]  Physical Therapy Treatment  Patient Details  Name: Stephanie Joseph MRN: [id number] Date of Birth: 1961/03/12 Referring Provider: Veryl Speak  Encounter Date: 11/13/2015      PT End of Session - 11/13/15 1013    Visit Number 2   Number of Visits 17   Date for PT Re-Evaluation 12/31/15   Authorization Type [employer], PT only   PT Start Time 1015   PT Stop Time 1055   PT Time Calculation (min) 40 min      Past Medical History  Diagnosis Date  . Hypertension   . Mitral regurgitation   . Depression   . Gout   . Anemia, iron deficiency   . Dysmenorrhea   . Menorrhagia   . Pneumonia   . Diabetes mellitus without complication (HCC)   . Heart valve problem   . Arthritis     Past Surgical History  Procedure Laterality Date  . Knee arthroscopy      Left    There were no vitals filed for this visit.      Subjective Assessment - 11/13/15 1015    Subjective Pt reports high pain levels into L buttock that began this morning. Denies pain any further into lower extremities.    Currently in Pain? Yes   Pain Score 8    Pain Location Buttocks   Pain Orientation Left   Pain Descriptors / Indicators Aching                         [facility] Adult PT Treatment/Exercise - 11/13/15 0001    Exercises   Exercises Lumbar   Lumbar Exercises: Stretches   Lower Trunk Rotation 60 seconds   Prone on Elbows Stretch Other (comment)  3 min   Press Ups Other (comment)  x15   Press Ups Limitations prone to on elbows   Lumbar Exercises: Supine   Ab Set Other (comment)  3 minutes   AB Set Limitations separate exercise: ab set with ball squeeze 3 min   Other Supine Lumbar Exercises isometric hip extension, bolster under knees 3 min   Lumbar Exercises: Prone   Single Arm Raise 20 reps   Other Prone Lumbar Exercises --   Other  Prone Lumbar Exercises prone glut sets on elbows 15x3sec ea                PT Education - 11/13/15 1056    Education provided Yes   Education Details coordination of core and glut activation; support of spine by core; exercise form/rationale   Person(s) Educated Patient   Methods Explanation;Demonstration;Tactile cues;Verbal cues;Handout   Comprehension Verbalized understanding;Verbal cues required;Tactile cues required;Need further instruction          PT Short Term Goals - 11/05/15 1324    PT SHORT TERM GOAL #1   Title Pt will report average pain <5/10 by 5/26   Time 4   Period Weeks   Status New   PT SHORT TERM GOAL #2   Title Verbalize improved sleeping ability   Time 4   Period Weeks   Status New   PT SHORT TERM GOAL #3   Title Ability to control pain with postural positioning and use of HEP   Time 4   Period Weeks   Status New           PT Long Term  Goals - 11/05/15 1322    PT LONG TERM GOAL #1   Title FOTO to 48% ability by 6/26   Time 8   Period Weeks   Status New   PT LONG TERM GOAL #2   Title Pt will be able to sleep being woken <1 per night due to pain   Time 8   Period Weeks   Status New   PT LONG TERM GOAL #3   Title Pt will be able to return to cooking with miniml impairment    Time 8   Period Weeks   Status New   PT LONG TERM GOAL #4   Title Pt will be able to perform HHC with minimal impairment   Time 8   Period Weeks   Status New   PT LONG TERM GOAL #5   Title Pt will be able to usher at church pain <3/10   Time 8   Period Weeks   Status New               Plan - 11/13/15 1030    Clinical Impression Statement Notable centralization being reported, denied pain past buttock. Heavy verbal and tactile cuing required for core and glut sets; was unable to perform active glut set- 2+/5 noted when asked to perform isometric hip extension into bolster. Frequent cuing required to avoid valsalva, pt reported significant difficulty  not holding breath. Freqent c/o pain in lower abdomen with attempted glut contractions that was resolved with cuing, positioning and breathing.    PT Next Visit Plan glut sets (swimmers when able)   PT Home Exercise Plan prone on elbows 5 min before getting out of bed and before going to sleep, standing extensions at counter PRN for pain; transverse abdominis with breathing      Patient will benefit from skilled therapeutic intervention in order to improve the following deficits and impairments:  Decreased range of motion, Difficulty walking, Decreased activity tolerance, Pain, Improper body mechanics, Decreased strength, Postural dysfunction  Visit Diagnosis: Bilateral low back pain, with sciatica presence unspecified  Difficulty in walking, not elsewhere classified  Muscle weakness (generalized)     Problem List Patient Active Problem List   Diagnosis Date Noted  . Back pain 10/18/2015  . Type 2 diabetes mellitus (HCC) 07/10/2015  . Hormone replacement therapy (postmenopausal) 10/04/2014  . Mitral regurgitation 11/05/2012  . Aortic valve disease 10/21/2011  . Menorrhagia, premenopausal 09/01/2011  . Depression 03/04/2010  . GOUT 12/16/2006  . ANEMIA, IRON DEFICIENCY NOS 12/16/2006  . TOBACCO ABUSE 12/16/2006  . Essential hypertension 12/16/2006    Eluzer Howdeshell C. Roshanna Cimino PT, DPT 11/13/2015 10:59 AM   [facility] [address] [place], [place], [address] Phone: [phone]   Fax:  [phone]  Name: Stephanie Joseph MRN: [id number] Date of Birth: Oct 02, 1960

## 2015-11-15 ENCOUNTER — Encounter: Payer: Self-pay | Admitting: Physical Therapy

## 2015-11-15 ENCOUNTER — Ambulatory Visit: Admitting: Physical Therapy

## 2015-11-15 DIAGNOSIS — M545 Low back pain: Secondary | ICD-10-CM

## 2015-11-15 DIAGNOSIS — M6281 Muscle weakness (generalized): Secondary | ICD-10-CM

## 2015-11-15 DIAGNOSIS — R262 Difficulty in walking, not elsewhere classified: Secondary | ICD-10-CM

## 2015-11-15 NOTE — Therapy (Signed)
[facility] [address] [place], [place], [address] Phone: [phone]   Fax:  [phone]  Physical Therapy Treatment  Patient Details  Name: Stephanie Joseph MRN: [id number] Date of Birth: [birth year] Referring Provider: Veryl SpeakGregory D Calone  Encounter Date: 11/15/2015      PT End of Session - 11/15/15 0933    Visit Number 3   Number of Visits 17   Date for PT Re-Evaluation 12/31/15   Authorization Type [employer], PT only   PT Start Time 0930   PT Stop Time 1000  pt had to leave early due to other obligations   PT Time Calculation (min) 30 min      Past Medical History  Diagnosis Date  . Hypertension   . Mitral regurgitation   . Depression   . Gout   . Anemia, iron deficiency   . Dysmenorrhea   . Menorrhagia   . Pneumonia   . Diabetes mellitus without complication (HCC)   . Heart valve problem   . Arthritis     Past Surgical History  Procedure Laterality Date  . Knee arthroscopy      Left    There were no vitals filed for this visit.      Subjective Assessment - 11/15/15 0932    Subjective Pt reports continuing to feel pain with sittting or standing for too long. No pain past buttock again today    Limitations Sitting;Standing   Currently in Pain? Yes   Pain Score 5    Pain Location Buttocks   Pain Orientation Left                         [facility] Adult PT Treatment/Exercise - 11/15/15 0001    Lumbar Exercises: Stretches   Lower Trunk Rotation 60 seconds   Piriformis Stretch 2 reps;20 seconds   Lumbar Exercises: Aerobic   Stationary Bike 5 min L3   Lumbar Exercises: Supine   Bridge 15 reps   Bridge Limitations ball bw knees   Large Ball Abdominal Isometric 10 reps  10s   Large Ball Oblique Isometric 5 reps;5 seconds   Manual Therapy   Manual therapy comments roller and trigger point release L piriformis; PA Gr 3 L SIJ                PT Education - 11/15/15 1001    Education provided Yes   Education Details anatomy of condition, importance of glut contraction   Person(s) Educated Patient   Methods Explanation;Demonstration;Tactile cues;Verbal cues   Comprehension Verbalized understanding;Returned demonstration;Verbal cues required;Tactile cues required;Need further instruction          PT Short Term Goals - 11/05/15 1324    PT SHORT TERM GOAL #1   Title Pt will report average pain <5/10 by 5/26   Time 4   Period Weeks   Status New   PT SHORT TERM GOAL #2   Title Verbalize improved sleeping ability   Time 4   Period Weeks   Status New   PT SHORT TERM GOAL #3   Title Ability to control pain with postural positioning and use of HEP   Time 4   Period Weeks   Status New           PT Long Term Goals - 11/05/15 1322    PT LONG TERM GOAL #1   Title FOTO to 48% ability by 6/26   Time 8   Period Weeks   Status  New   PT LONG TERM GOAL #2   Title Pt will be able to sleep being woken <1 per night due to pain   Time 8   Period Weeks   Status New   PT LONG TERM GOAL #3   Title Pt will be able to return to cooking with miniml impairment    Time 8   Period Weeks   Status New   PT LONG TERM GOAL #4   Title Pt will be able to perform HHC with minimal impairment   Time 8   Period Weeks   Status New   PT LONG TERM GOAL #5   Title Pt will be able to usher at church pain <3/10   Time 8   Period Weeks   Status New               Plan - 11/15/15 1002    Clinical Impression Statement Pt cont to have significant difficulty contracting glut max bilat. Unable to stand from chair without UE, utilizing quick knee extension and low back musculature. No complaints of abdominal pain.   PT Treatment/Interventions ADLs/Self Care Home Management;Cryotherapy;Electrical Stimulation;Iontophoresis /ml Dexamethasone;Moist Heat;Therapeutic exercise;Therapeutic activities;Functional mobility training;Stair training;Gait training;Traction;Balance  training;Neuromuscular re-education;Patient/family education;Manual techniques;Taping;Dry needling;Passive range of motion   PT Next Visit Plan continue to train glut set   PT Home Exercise Plan prone on elbows 5 min before getting out of bed and before going to sleep, standing extensions at counter PRN for pain; transverse abdominis with breathing      Patient will benefit from skilled therapeutic intervention in order to improve the following deficits and impairments:  Decreased range of motion, Difficulty walking, Decreased activity tolerance, Pain, Improper body mechanics, Decreased strength, Postural dysfunction  Visit Diagnosis: Bilateral low back pain, with sciatica presence unspecified  Difficulty in walking, not elsewhere classified  Muscle weakness (generalized)     Problem List Patient Active Problem List   Diagnosis Date Noted  . Back pain 10/18/2015  . Type 2 diabetes mellitus (HCC) 07/10/2015  . Hormone replacement therapy (postmenopausal) 10/04/2014  . Mitral regurgitation 11/05/2012  . Aortic valve disease 10/21/2011  . Menorrhagia, premenopausal 09/01/2011  . Depression 03/04/2010  . GOUT 12/16/2006  . ANEMIA, IRON DEFICIENCY NOS 12/16/2006  . TOBACCO ABUSE 12/16/2006  . Essential hypertension 12/16/2006   Antonis Lor C. Isidra Mings PT, DPT 11/15/2015 10:06 AM   [facility] [address] [place], [place], [address] Phone: [phone]   Fax:  [phone]  Name: Shaeleigh Graw MRN: [id number] Date of Birth: Nov 03, 1960

## 2015-11-20 ENCOUNTER — Ambulatory Visit: Admitting: Physical Therapy

## 2015-11-20 DIAGNOSIS — M6281 Muscle weakness (generalized): Secondary | ICD-10-CM

## 2015-11-20 DIAGNOSIS — R262 Difficulty in walking, not elsewhere classified: Secondary | ICD-10-CM

## 2015-11-20 DIAGNOSIS — M545 Low back pain: Secondary | ICD-10-CM | POA: Diagnosis not present

## 2015-11-20 NOTE — Therapy (Addendum)
Stephanie Joseph, [place], [address] Phone: [phone]   Fax:  [phone]  Physical Therapy Treatment / Discharge Note  Patient Details  Name: Stephanie Joseph MRN: [id number] Date of Birth: 10/06/1960 Referring Provider: Golden Joseph  Encounter Date: 11/20/2015      PT End of Session - 11/20/15 1235    Visit Number 4   Number of Visits 17   Date for PT Re-Evaluation 12/31/15   Authorization Type [employer], PT only   PT Start Time 1148   PT Stop Time 1230   PT Time Calculation (min) 42 min   Activity Tolerance Patient tolerated treatment well   Behavior During Therapy Stephanie Joseph [place] for tasks assessed/performed      Past Medical History  Diagnosis Date  . Hypertension   . Mitral regurgitation   . Depression   . Gout   . Anemia, iron deficiency   . Dysmenorrhea   . Menorrhagia   . Pneumonia   . Diabetes mellitus without complication ([place])   . Heart valve problem   . Arthritis     Past Surgical History  Procedure Laterality Date  . Knee arthroscopy      Left    There were no vitals filed for this visit.      Subjective Assessment - 11/20/15 1155    Subjective things are going okay right now   Currently in Pain? Yes   Pain Score 3    Pain Location Buttocks   Pain Orientation Left   Pain Descriptors / Indicators Aching   Pain Onset More than a month ago   Aggravating Factors  standing   Pain Relieving Factors medication, ice                         [facility] Adult PT Treatment/Exercise - 11/20/15 1156    Lumbar Exercises: Stretches   Pelvic Tilt 5 reps;10 seconds  x 2 sitting on dyandics for proper sitting posture   Prone on Elbows Stretch 2 reps;30 seconds   Lumbar Exercises: Aerobic   Stationary Bike Nu-step L 5 x 5 min  LE only   Lumbar Exercises: Seated   Sit to Stand 10 reps   Sit to Stand Limitations cues for nose over toes    Lumbar Exercises: Supine   Bridge 10  reps;5 seconds  2 sets with clams: red theraband   Lumbar Exercises: Prone   Other Prone Lumbar Exercises press-ups 2 x 10    Manual Therapy   Manual Therapy Joint mobilization   Joint Mobilization IASTM of the glute maximus/ medius                  PT Short Term Goals - 11/20/15 1242    PT SHORT TERM GOAL #1   Title Pt will report average pain <5/10 by 5/26   Time 4   Period Weeks   Status On-going   PT SHORT TERM GOAL #2   Title Verbalize improved sleeping ability   Time 4   Period Weeks   Status On-going   PT SHORT TERM GOAL #3   Title Ability to control pain with postural positioning and use of HEP   Time 4   Period Weeks   Status On-going           PT Long Term Goals - 11/05/15 1322    PT LONG TERM GOAL #1   Title FOTO to 48% ability by 6/26  Time 8   Period Weeks   Status New   PT LONG TERM GOAL #2   Title Pt will be able to sleep being woken <1 per night due to pain   Time 8   Period Weeks   Status New   PT LONG TERM GOAL #3   Title Pt will be able to return to cooking with miniml impairment    Time 8   Period Weeks   Status New   PT LONG TERM GOAL #4   Title Pt will be able to perform [place] with minimal impairment   Time 8   Period Weeks   Status New   PT LONG TERM GOAL #5   Title Pt will be able to usher at church pain <3/10   Time [place] - 11/20/15 1235    Clinical Impression Statement [name] Reports she is doing a little better but still has intermittent pain in the glutes/ low back. she was able to perform sit to stand exercises better this visit with cues for proper mechanics. following todays session she reported no pain.    PT Next Visit Plan continue to train glut set, manual on glutes PRN, prone extension progression   PT Home Exercise Plan bridge with clam shell, single knee to chest glute stretch.    Consulted and Agree with Plan of Care Patient      Patient will  benefit from skilled therapeutic intervention in order to improve the following deficits and impairments:  Decreased range of motion, Difficulty walking, Decreased activity tolerance, Pain, Improper body mechanics, Decreased strength, Postural dysfunction  Visit Diagnosis: Bilateral low back pain, with sciatica presence unspecified  Difficulty in walking, not elsewhere classified  Muscle weakness (generalized)     Problem List Patient Active Problem List   Diagnosis Date Noted  . Back pain 10/18/2015  . Type 2 diabetes mellitus ([place]) 07/10/2015  . Hormone replacement therapy (postmenopausal) 10/04/2014  . Mitral regurgitation 11/05/2012  . Aortic valve disease 10/21/2011  . Menorrhagia, premenopausal 09/01/2011  . Depression 03/04/2010  . GOUT 12/16/2006  . ANEMIA, IRON DEFICIENCY NOS 12/16/2006  . TOBACCO ABUSE 12/16/2006  . Essential hypertension 12/16/2006   Stephanie Joseph PT, DPT, LAT, ATC  11/20/2015  12:47 PM      [place] Stephanie Joseph [address] [place], [place], [address] Phone: [phone]   Fax:  [phone]  Name: Stephanie Joseph MRN: [id number] Date of Birth: [birth year]    PHYSICAL THERAPY DISCHARGE SUMMARY  Visits from Start of Care: 4  Current functional level related to goals / functional outcomes: See goals   Remaining deficits: unknown   Education / Equipment: HEP, Theraband,   Plan: Patient agrees to discharge.  Patient goals were not met. Patient is being discharged due to meeting the stated rehab goals.  ?????

## 2015-11-23 ENCOUNTER — Encounter

## 2015-11-29 ENCOUNTER — Ambulatory Visit (INDEPENDENT_AMBULATORY_CARE_PROVIDER_SITE_OTHER): Admitting: Family

## 2015-11-29 ENCOUNTER — Encounter: Payer: Self-pay | Admitting: Family

## 2015-11-29 ENCOUNTER — Other Ambulatory Visit (INDEPENDENT_AMBULATORY_CARE_PROVIDER_SITE_OTHER)

## 2015-11-29 ENCOUNTER — Telehealth: Payer: Self-pay | Admitting: Family

## 2015-11-29 VITALS — BP 128/80 | HR 80 | Temp 98.4°F | Resp 16 | Ht 67.0 in | Wt 215.0 lb

## 2015-11-29 DIAGNOSIS — R200 Anesthesia of skin: Secondary | ICD-10-CM

## 2015-11-29 LAB — COMPREHENSIVE METABOLIC PANEL
ALT: 6 U/L (ref 0–35)
AST: 10 U/L (ref 0–37)
Albumin: 3.9 g/dL (ref 3.5–5.2)
Alkaline Phosphatase: 70 U/L (ref 39–117)
BUN: 11 mg/dL (ref 6–23)
CO2: 29 mEq/L (ref 19–32)
Calcium: 9.1 mg/dL (ref 8.4–10.5)
Chloride: 107 mEq/L (ref 96–112)
Creatinine, Ser: 0.86 mg/dL (ref 0.40–1.20)
GFR: 88.08 mL/min (ref >60.00)
Glucose, Bld: 88 mg/dL (ref 70–99)
Potassium: 4.1 mEq/L (ref 3.5–5.1)
Sodium: 140 mEq/L (ref 135–145)
Total Bilirubin: 0.4 mg/dL (ref 0.2–1.2)
Total Protein: 6.6 g/dL (ref 6.0–8.3)

## 2015-11-29 LAB — VITAMIN B12: Vitamin B-12: 147 pg/mL — ABNORMAL LOW (ref 211–911)

## 2015-11-29 LAB — HEMOGLOBIN A1C: Hgb A1c MFr Bld: 6.1 % (ref 4.6–6.5)

## 2015-11-29 NOTE — Patient Instructions (Signed)
Thank you for choosing [facility].  Summary/Instructions:  Please stop by the lab on the basement level of the building for your blood work. Your results will be released to MyChart (or called to you) after review, usually within 72 hours after test completion. If any changes need to be made, you will be notified at that same time.  If your symptoms worsen or fail to improve, please contact our office for further instruction, or in case of emergency go directly to the emergency room at the closest medical facility.    Peripheral Neuropathy Peripheral neuropathy is a type of nerve damage. It affects nerves that carry signals between the spinal cord and other parts of the body. These are called peripheral nerves. With peripheral neuropathy, one nerve or a group of nerves may be damaged.  CAUSES  Many things can damage peripheral nerves. For some people with peripheral neuropathy, the cause is unknown. Some causes include:  Diabetes. This is the most common cause of peripheral neuropathy.  Injury to a nerve.  Pressure or stress on a nerve that lasts a long time.  Too little vitamin B. Alcoholism can lead to this.  Infections.  Autoimmune diseases, such as multiple sclerosis and systemic lupus erythematosus.  Inherited nerve diseases.  Some medicines, such as cancer drugs.  Toxic substances, such as lead and mercury.  Too little blood flowing to the legs.  Kidney disease.  Thyroid disease. SIGNS AND SYMPTOMS  Different people have different symptoms. The symptoms you have will depend on which of your nerves is damaged. Common symptoms include:  Loss of feeling (numbness) in the feet and hands.  Tingling in the feet and hands.  Pain that burns.  Very sensitive skin.  Weakness.  Not being able to move a part of the body (paralysis).  Muscle twitching.  Clumsiness or poor coordination.  Loss of balance.  Not being able to control your bladder.  Feeling  dizzy.  Sexual problems. DIAGNOSIS  Peripheral neuropathy is a symptom, not a disease. Finding the cause of peripheral neuropathy can be hard. To figure that out, your health care provider will take a medical history and do a physical exam. A neurological exam will also be done. This involves checking things affected by your brain, spinal cord, and nerves (nervous system). For example, your health care provider will check your reflexes, how you move, and what you can feel.  Other types of tests may also be ordered, such as:  Blood tests.  A test of the fluid in your spinal cord.  Imaging tests, such as CT scans or an MRI.  Electromyography (EMG). This test checks the nerves that control muscles.  Nerve conduction velocity tests. These tests check how fast messages pass through your nerves.  Nerve biopsy. A small piece of nerve is removed. It is then checked under a microscope. TREATMENT   Medicine is often used to treat peripheral neuropathy. Medicines may include:  Pain-relieving medicines. Prescription or over-the-counter medicine may be suggested.  Antiseizure medicine. This may be used for pain.  Antidepressants. These also may help ease pain from neuropathy.  Lidocaine. This is a numbing medicine. You might wear a patch or be given a shot.  Mexiletine. This medicine is typically used to help control irregular heart rhythms.  Surgery. Surgery may be needed to relieve pressure on a nerve or to destroy a nerve that is causing pain.  Physical therapy to help movement.  Assistive devices to help movement. HOME CARE INSTRUCTIONS  Only take over-the-counter or prescription medicines as directed by your health care provider. Follow the instructions carefully for any given medicines. Do not take any other medicines without first getting approval from your health care provider.  If you have diabetes, work closely with your health care provider to keep your blood sugar under  control.  If you have numbness in your feet:  Check every day for signs of injury or infection. Watch for redness, warmth, and swelling.  Wear padded socks and comfortable shoes. These help protect your feet.  Do not do things that put pressure on your damaged nerve.  Do not smoke. Smoking keeps blood from getting to damaged nerves.  Avoid or limit alcohol. Too much alcohol can cause a lack of B vitamins. These vitamins are needed for healthy nerves.  Develop a good support system. Coping with peripheral neuropathy can be stressful. Talk to a mental health specialist or join a support group if you are struggling.  Follow up with your health care provider as directed. SEEK MEDICAL CARE IF:   You have new signs or symptoms of peripheral neuropathy.  You are struggling emotionally from dealing with peripheral neuropathy.  You have a fever. SEEK IMMEDIATE MEDICAL CARE IF:   You have an injury or infection that is not healing.  You feel very dizzy or begin vomiting.  You have chest pain.  You have trouble breathing.   This information is not intended to replace advice given to you by your health care provider. Make sure you discuss any questions you have with your health care provider.   Document Released: 06/13/2002 Document Revised: 03/05/2011 Document Reviewed: 02/28/2013 Elsevier Interactive Patient Education 2016 Elsevier Inc.   

## 2015-11-29 NOTE — Progress Notes (Signed)
Pre visit review using our clinic review tool, if applicable. No additional management support is needed unless otherwise documented below in the visit note. 

## 2015-11-29 NOTE — Assessment & Plan Note (Signed)
Numbness and tingling of fingertips of undetermined origin with concern for increased blood sugars as patient has not been taking her diabetes medication secondary to side effects of gastrointestinal distress. Obtain A1c, B12, and complete metabolic panel. If blood work is negative may need to consider referral to neurology for nerve conduction test. Does not appear to be repetitive motion or carpal tunnel related.

## 2015-11-29 NOTE — Telephone Encounter (Signed)
Please inform patient that her A1c has increased to 6.1 indicating prediabetes and her B12 levels are low which may also be contributing to the numbness and tingling in her fingers. There are 2 options for punishment of B12, the first being oral supplementation from over-the-counter medications and the second being injections. If she is interested in doing the oral over-the-counter is 1000 micrograms daily. I usually recommend [employer] products. Otherwise her blood work was normal. If she would like to do the injections she can schedule a nurse visit.

## 2015-11-29 NOTE — Progress Notes (Signed)
Subjective:    Patient ID: Stephanie Joseph, female    DOB: Mar 22, 1961, 55 y.o.   MRN: [id number]  Chief Complaint  Patient presents with  . Finger numbness    x2 weeks has had numbness in all fingers all day long    HPI:  Stephanie Joseph is a 55 y.o. female who  has a past medical history of Hypertension; Mitral regurgitation; Depression; Gout; Anemia, iron deficiency; Dysmenorrhea; Menorrhagia; Pneumonia; Diabetes mellitus without complication ([place]); Heart valve problem; and Arthritis. and presents today For an acute office visit.  1.) Numbness - this is a new problem. Associated symptom of numbness located in all of her fingers primarily at the tip have been going on for approximately 2 weeks and occurs all day long. Denies any trauma or weakness. Denies any repetitive motions or typing at work. Blood sugars have been around 130s-150s. Describes pins and needles sensation. No modifying factors or treatments that make it better. Denies neck pain.   Lab Results  Component Value Date   HGBA1C 5.8 07/10/2015    No Known Allergies   Current Outpatient Prescriptions on File Prior to Visit  Medication Sig Dispense Refill  . amLODipine (NORVASC) 10 MG tablet Take 1 tablet (10 mg total) by mouth daily. 90 tablet 1  . empagliflozin (JARDIANCE) 10 MG TABS tablet Take 10 mg by mouth daily.    Marland Kitchen estradiol (ESTRACE) 1 MG tablet TAKE ONE TABLET BY MOUTH ONCE DAILY 30 tablet 0  . hydrochlorothiazide 25 MG tablet Take 25 mg by mouth daily.      . hydrOXYzine (ATARAX/VISTARIL) 50 MG tablet Take 50 mg by mouth 3 (three) times daily as needed for itching.    . losartan (COZAAR) 100 MG tablet Take 1 tablet (100 mg total) by mouth daily. 90 tablet 1  . medroxyPROGESTERone (PROVERA) 5 MG tablet TAKE ONE TABLET BY MOUTH ONCE DAILY 30 tablet 0  . meloxicam (MOBIC) 15 MG tablet Take 1 tablet (15 mg total) by mouth daily. 30 tablet 0  . metFORMIN (GLUCOPHAGE) 500 MG tablet Take 500 mg by mouth daily.       . methocarbamol (ROBAXIN) 500 MG tablet Take 2 tablets (1,000 mg total) by mouth 3 (three) times daily as needed for muscle spasms. 90 tablet 0  . QUEtiapine (SEROQUEL) 50 MG tablet Take 50 mg by mouth at bedtime.    Marland Kitchen rOPINIRole (REQUIP) 1 MG tablet Take 1 tablet (1 mg total) by mouth daily. 90 tablet 1  . sitaGLIPtin (JANUVIA) 100 MG tablet Take 100 mg by mouth daily.    . traMADol (ULTRAM) 50 MG tablet Take 1 tablet (50 mg total) by mouth every 6 (six) hours as needed. 120 tablet 0  . venlafaxine (EFFEXOR) 75 MG tablet Take 75 mg by mouth 3 (three) times daily with meals.     No current facility-administered medications on file prior to visit.     Review of Systems  Constitutional: Negative for fever and chills.  Endocrine: Negative for polydipsia, polyphagia and polyuria.  Musculoskeletal: Negative for neck pain.  Neurological: Positive for numbness. Negative for dizziness, weakness and headaches.      Objective:    BP 128/80 mmHg  Pulse 80  Temp(Src) 98.4 F (36.9 C) (Oral)  Resp 16  Ht _0  (1.702 m)  Wt 215 lb (97.523 kg)  BMI 33.67 kg/m2  SpO2 98% Nursing note and vital signs reviewed.  Physical Exam  Constitutional: She is oriented to person, place, and time. She appears  well-developed and well-nourished. No distress.  Cardiovascular: Normal rate, regular rhythm, normal heart sounds and intact distal pulses.   Pulmonary/Chest: Effort normal and breath sounds normal.  Musculoskeletal:  Bilateral hands - no obvious deformity, discoloration, or edema. No palpable tenderness able to be elicited. Describes numbness of fingertips on bilateral hands. Pulses are intact and appropriate. Negative Phalen's. Range of motion within normal limits.  Neurological: She is alert and oriented to person, place, and time.  Skin: Skin is warm and dry.  Psychiatric: She has a normal mood and affect. Her behavior is normal. Judgment and thought content normal.       Assessment & Plan:    Problem List Items Addressed This Visit      Other   Finger numbness - Primary    Numbness and tingling of fingertips of undetermined origin with concern for increased blood sugars as patient has not been taking her diabetes medication secondary to side effects of gastrointestinal distress. Obtain A1c, B12, and complete metabolic panel. If blood work is negative may need to consider referral to neurology for nerve conduction test. Does not appear to be repetitive motion or carpal tunnel related.      Relevant Orders   Hemoglobin A1c   B12   Comp Met (CMET)       I am having Ms. Gibby maintain her hydrochlorothiazide, hydrOXYzine, venlafaxine, metFORMIN, QUEtiapine, sitaGLIPtin, empagliflozin, amLODipine, losartan, rOPINIRole, methocarbamol, meloxicam, traMADol, estradiol, and medroxyPROGESTERone.   Follow-up: Pending blood work  Mauricio Po, [place]

## 2015-11-30 ENCOUNTER — Telehealth: Payer: Self-pay | Admitting: Family

## 2015-11-30 NOTE — Telephone Encounter (Signed)
Called pt to let her know that injections are given once a month. She understood.

## 2015-11-30 NOTE — Telephone Encounter (Signed)
Patient would like to know how often she needs to get B12 injections

## 2015-11-30 NOTE — Telephone Encounter (Signed)
Pt aware of results 

## 2015-12-05 ENCOUNTER — Ambulatory Visit (INDEPENDENT_AMBULATORY_CARE_PROVIDER_SITE_OTHER): Admitting: Geriatric Medicine

## 2015-12-05 DIAGNOSIS — E538 Deficiency of other specified B group vitamins: Secondary | ICD-10-CM

## 2015-12-05 MED ORDER — CYANOCOBALAMIN 1000 MCG/ML IJ SOLN
1000.0000 ug | Freq: Once | INTRAMUSCULAR | Status: AC
  Administered 2015-12-05: 1000 ug via INTRAMUSCULAR

## 2015-12-06 ENCOUNTER — Emergency Department (HOSPITAL_COMMUNITY)
Admission: EM | Admit: 2015-12-06 | Discharge: 2015-12-06 | Disposition: A | Discharge status: Home / Self Care | Attending: Emergency Medicine | Admitting: Emergency Medicine

## 2015-12-06 ENCOUNTER — Encounter (HOSPITAL_COMMUNITY): Payer: Self-pay | Admitting: Emergency Medicine

## 2015-12-06 DIAGNOSIS — Z79899 Other long term (current) drug therapy: Secondary | ICD-10-CM | POA: Diagnosis not present

## 2015-12-06 DIAGNOSIS — Z862 Personal history of diseases of the blood and blood-forming organs and certain disorders involving the immune mechanism: Secondary | ICD-10-CM | POA: Diagnosis not present

## 2015-12-06 DIAGNOSIS — F329 Major depressive disorder, single episode, unspecified: Secondary | ICD-10-CM | POA: Diagnosis not present

## 2015-12-06 DIAGNOSIS — F17210 Nicotine dependence, cigarettes, uncomplicated: Secondary | ICD-10-CM | POA: Insufficient documentation

## 2015-12-06 DIAGNOSIS — Z8701 Personal history of pneumonia (recurrent): Secondary | ICD-10-CM | POA: Insufficient documentation

## 2015-12-06 DIAGNOSIS — D849 Immunodeficiency, unspecified: Secondary | ICD-10-CM | POA: Diagnosis not present

## 2015-12-06 DIAGNOSIS — R350 Frequency of micturition: Secondary | ICD-10-CM | POA: Diagnosis not present

## 2015-12-06 DIAGNOSIS — R319 Hematuria, unspecified: Secondary | ICD-10-CM | POA: Insufficient documentation

## 2015-12-06 DIAGNOSIS — E1165 Type 2 diabetes mellitus with hyperglycemia: Secondary | ICD-10-CM | POA: Insufficient documentation

## 2015-12-06 DIAGNOSIS — M1990 Unspecified osteoarthritis, unspecified site: Secondary | ICD-10-CM | POA: Insufficient documentation

## 2015-12-06 DIAGNOSIS — R3129 Other microscopic hematuria: Secondary | ICD-10-CM

## 2015-12-06 DIAGNOSIS — Z791 Long term (current) use of non-steroidal anti-inflammatories (NSAID): Secondary | ICD-10-CM | POA: Diagnosis not present

## 2015-12-06 DIAGNOSIS — Z79818 Long term (current) use of other agents affecting estrogen receptors and estrogen levels: Secondary | ICD-10-CM | POA: Diagnosis not present

## 2015-12-06 DIAGNOSIS — Z8742 Personal history of other diseases of the female genital tract: Secondary | ICD-10-CM | POA: Diagnosis not present

## 2015-12-06 DIAGNOSIS — I10 Essential (primary) hypertension: Secondary | ICD-10-CM | POA: Insufficient documentation

## 2015-12-06 DIAGNOSIS — R51 Headache: Secondary | ICD-10-CM | POA: Diagnosis not present

## 2015-12-06 DIAGNOSIS — R519 Headache, unspecified: Secondary | ICD-10-CM

## 2015-12-06 DIAGNOSIS — Z7984 Long term (current) use of oral hypoglycemic drugs: Secondary | ICD-10-CM | POA: Insufficient documentation

## 2015-12-06 DIAGNOSIS — R011 Cardiac murmur, unspecified: Secondary | ICD-10-CM | POA: Insufficient documentation

## 2015-12-06 LAB — URINALYSIS, ROUTINE W REFLEX MICROSCOPIC
Bilirubin Urine: NEGATIVE
Glucose, UA: NEGATIVE mg/dL
Ketones, ur: NEGATIVE mg/dL
Leukocytes, UA: NEGATIVE
Nitrite: NEGATIVE
Protein, ur: NEGATIVE mg/dL
Specific Gravity, Urine: 1.014 (ref 1.005–1.030)
pH: 7.5 (ref 5.0–8.0)

## 2015-12-06 LAB — URINE MICROSCOPIC-ADD ON

## 2015-12-06 LAB — CBG MONITORING, ED: Glucose-Capillary: 126 mg/dL — ABNORMAL HIGH (ref 65–99)

## 2015-12-06 MED ORDER — OXYCODONE-ACETAMINOPHEN 5-325 MG PO TABS: 1.0000 | ORAL_TABLET | Freq: Four times a day (QID) | ORAL | Status: DC | PRN

## 2015-12-06 MED ORDER — OXYCODONE-ACETAMINOPHEN 5-325 MG PO TABS
ORAL_TABLET | ORAL | Status: AC
  Filled 2015-12-06: qty 1

## 2015-12-06 MED ORDER — OXYCODONE-ACETAMINOPHEN 5-325 MG PO TABS
1.0000 | ORAL_TABLET | ORAL | Status: DC | PRN
  Administered 2015-12-06: 1 via ORAL

## 2015-12-06 NOTE — ED Notes (Signed)
Called lab to add on urine culture ?

## 2015-12-06 NOTE — Discharge Instructions (Signed)
Use Ibuprofen or Tylenol as needed for pain, use percocet as directed as needed for additional pain relief but don't drive or operate machinery while taking this medication. Stay well hydrated. Get plenty of rest. Avoid caffeine use to see if this helps with your urinary frequency. The lab will call you if your urine sample reveals an infection, but it doesn't seem that this is the cause of your urinary symptoms. Follow up with your regular doctor in 1 week for recheck of symptoms and ongoing management of your headaches and urinary frequency. Return to the ER for changes or worsening symptoms.    General Headache Without Cause A headache is pain or discomfort felt around the head or neck area. There are many causes and types of headaches. In some cases, the cause may not be found.  HOME CARE  Managing Pain  Take over-the-counter and prescription medicines only as told by your doctor.  Lie down in a dark, quiet room when you have a headache.  If directed, apply ice to the head and neck area:  Put ice in a plastic bag.  Place a towel between your skin and the bag.  Leave the ice on for 20 minutes, 2-3 times per day.  Use a heating pad or hot shower to apply heat to the head and neck area as told by your doctor.  Keep lights dim if bright lights bother you or make your headaches worse. Eating and Drinking  Eat meals on a regular schedule.  Lessen how much alcohol you drink.  Lessen how much caffeine you drink, or stop drinking caffeine. General Instructions  Keep all follow-up visits as told by your doctor. This is important.  Keep a journal to find out if certain things bring on headaches. For example, write down:  What you eat and drink.  How much sleep you get.  Any change to your diet or medicines.  Relax by getting a massage or doing other relaxing activities.  Lessen stress.  Sit up straight. Do not tighten (tense) your muscles.  Do not use tobacco products. This  includes cigarettes, chewing tobacco, or e-cigarettes. If you need help quitting, ask your doctor.  Exercise regularly as told by your doctor.  Get enough sleep. This often means 7-9 hours of sleep. GET HELP IF:  Your symptoms are not helped by medicine.  You have a headache that feels different than the other headaches.  You feel sick to your stomach (nauseous) or you throw up (vomit).  You have a fever. GET HELP RIGHT AWAY IF:   Your headache becomes really bad.  You keep throwing up.  You have a stiff neck.  You have trouble seeing.  You have trouble speaking.  You have pain in the eye or ear.  Your muscles are weak or you lose muscle control.  You lose your balance or have trouble walking.  You feel like you will pass out (faint) or you pass out.  You have confusion.   This information is not intended to replace advice given to you by your health care provider. Make sure you discuss any questions you have with your health care provider.   Document Released: 04/01/2008 Document Revised: 03/14/2015 Document Reviewed: 10/16/2014 Elsevier Interactive Patient Education [employer]  Urinary Frequency The number of times a normal person urinates depends upon how much liquid they take in and how much liquid they are losing. If the temperature is hot and there is high humidity, then the person will  sweat more and usually breathe a little more frequently. These factors decrease the amount of frequency of urination that would be considered normal. The amount you drink is easily determined, but the amount of fluid lost is sometimes more difficult to calculate.  Fluid is lost in two ways:  Sensible fluid loss is usually measured by the amount of urine that you get rid of. Losses of fluid can also occur with diarrhea.  Insensible fluid loss is more difficult to measure. It is caused by evaporation. Insensible loss of fluid occurs through breathing and sweating. It  usually ranges from a little less than a quart to a little more than a quart of fluid a day. In normal temperatures and activity levels, the average person may urinate 4 to 7 times in a 24-hour period. Needing to urinate more often than that could indicate a problem. If one urinates 4 to 7 times in 24 hours and has large volumes each time, that could indicate a different problem from one who urinates 4 to 7 times a day and has small volumes. The time of urinating is also important. Most urinating should be done during the waking hours. Getting up at night to urinate frequently can indicate some problems. CAUSES  The bladder is the organ in your lower abdomen that holds urine. Like a balloon, it swells some as it fills up. Your nerves sense this and tell you it is time to head for the bathroom. There are a number of reasons that you might feel the need to urinate more often than usual. They include:  Urinary tract infection. This is usually associated with other signs such as burning when you urinate.  In men, problems with the prostate (a walnut-size gland that is located near the tube that carries urine out of your body). There are two reasons why the prostate can cause an increased frequency of urination:  An enlarged prostate that does not let the bladder empty well. If the bladder only half empties when you urinate, then it only has half the capacity to fill before you have to urinate again.  The nerves in the bladder become more hypersensitive with an increased size of the prostate even if the bladder empties completely.  Pregnancy.  Obesity. Excess weight is more likely to cause a problem for women than for men.  Bladder stones or other bladder problems.  Caffeine.  Alcohol.  Medications. For example, drugs that help the body get rid of extra fluid (diuretics) increase urine production. Some other medicines must be taken with lots of fluids.  Muscle or nerve weakness. This might be the  result of a spinal cord injury, a stroke, multiple sclerosis, or Parkinson disease.  Long-standing diabetes can decrease the sensation of the bladder. This loss of sensation makes it harder to sense the bladder needs to be emptied. Over a period of years, the bladder is stretched out by constant overfilling. This weakens the bladder muscles so that the bladder does not empty well and has less capacity to fill with new urine.  Interstitial cystitis (also called painful bladder syndrome). This condition develops because the tissues that line the inside of the bladder are inflamed (inflammation is the body's way of reacting to injury or infection). It causes pain and frequent urination. It occurs in women more often than in men. DIAGNOSIS   To decide what might be causing your urinary frequency, your health care provider will probably:  Ask about symptoms you have noticed.  Ask about  your overall health. This will include questions about any medications you are taking.  Do a physical examination.  Order some tests. These might include:  A blood test to check for diabetes or other health issues that could be contributing to the problem.  Urine testing. This could measure the flow of urine and the pressure on the bladder.  A test of your neurological system (the brain, spinal cord, and nerves). This is the system that senses the need to urinate.  A bladder test to check whether it is emptying completely when you urinate.  Cystoscopy. This test uses a thin tube with a tiny camera on it. It offers a look inside your urethra and bladder to see if there are problems.  Imaging tests. You might be given a contrast dye and then asked to urinate. X-rays are taken to see how your bladder is working. TREATMENT  It is important for you to be evaluated to determine if the amount or frequency that you have is unusual or abnormal. If it is found to be abnormal, the cause should be determined and this can  usually be found out easily. Depending upon the cause, treatment could include medication, stimulation of the nerves, or surgery. There are not too many things that you can do as an individual to change your urinary frequency. It is important that you balance the amount of fluid intake needed to compensate for your activity and the temperature. Medical problems will be diagnosed and taken care of by your physician. There is no particular bladder training such as Kegel exercises that you can do to help urinary frequency. This is an exercise that is usually recommended for people who have leaking of urine when they laugh, cough, or sneeze. HOME CARE INSTRUCTIONS   Take any medications your health care provider prescribed or suggested. Follow the directions carefully.  Practice any lifestyle changes that are recommended. These might include:  Drinking less fluid or drinking at different times of the day. If you need to urinate often during the night, for example, you may need to stop drinking fluids early in the evening.  Cutting down on caffeine or alcohol. They both can make you need to urinate more often than normal. Caffeine is found in coffee, tea, and sodas.  Losing weight, if that is recommended.  Keep a journal or a log. You might be asked to record how much you drink and when and where you feel the need to urinate. This will also help evaluate how well the treatment provided by your physician is working. SEEK MEDICAL CARE IF:   Your need to urinate often gets worse.  You feel increased pain or irritation when you urinate.  You notice blood in your urine.  You have questions about any medications that your health care provider recommended.  You notice blood, pus, or swelling at the site of any test or treatment procedure.  You develop a fever of more than 100.1F (38.1C). SEEK IMMEDIATE MEDICAL CARE IF:  You develop a fever of more than 102.84F (38.9C).   This information is not  intended to replace advice given to you by your health care provider. Make sure you discuss any questions you have with your health care provider.   Document Released: 04/19/2009 Document Revised: 07/14/2014 Document Reviewed: 04/19/2009 Elsevier Interactive Patient Education [employer] Else[employer] Inc[employer]

## 2015-12-06 NOTE — ED Notes (Signed)
Pt c/o headache since this morning. Hx of htn, taking medication as prescribed. Pt also states shes been peeing every thirty minutes. No other symptoms, neuro is intact, no neuro deficits. Ambulatory with steady gait.

## 2015-12-06 NOTE — ED Provider Notes (Signed)
CSN: [id number]     Arrival date & time 12/06/15  1559 History  By signing my name below, I, [facility], attest that this documentation has been prepared under the direction and in the presence of Juma Oxley Camprubi-Soms, PA-C. Electronically Signed: Randell Patient, ED Scribe. 12/06/2015. 6:10 PM.   Chief Complaint  Patient presents with  . Headache    Patient is a 55 y.o. female presenting with headaches. The history is provided by the patient. No language interpreter was used.  Headache Pain location:  R temporal Quality: throbbing. Radiates to:  Does not radiate Severity currently:  0/10 Severity at highest:  10/10 Onset quality:  Gradual Duration:  14 hours Timing:  Constant Progression:  Resolved Chronicity:  Recurrent Similar to prior headaches: yes   Relieved by:  Prescription medications (percocet given in triage) Worsened by:  Activity (bending or head movement) Ineffective treatments:  Aspirin Associated symptoms: no abdominal pain, no blurred vision, no diarrhea, no fever, no focal weakness, no myalgias, no nausea, no neck pain, no neck stiffness, no numbness, no paresthesias, no syncope, no tingling, no visual change, no vomiting and no weakness   HPI Comments: Kaliyah Gladman is a 55 y.o. female with a PMHx of HTN, DM2, gout, mitral regurgitation, arthritis, and anemia, who presents to the Emergency Department complaining of constant, throbbing, non-radiating, 10/10 right-sided HA onset earlier this morning around 4am after waking from sleep to use the restroom. Pain is was worse with head movement and completely resolved with Percocet given in the ED upon arrival. She had taken aspirin at home without relief. Pt states that she woke 14 hours ago to use the bathroom and was ambulating when her HA began, denies that the headache awoke her from rest. She notes similar HAs in the same location and the same severity in the past. Denies that this is the worst headache of  her life, states it's similar to prior headaches, and denies thunderclap onset. Additionally, she reports urinary frequency that has been ongoing for "a long time" but worse since last night.  Denies recent head injuries or falls. Denies fever, chills, neck stiffness, vision changes, lightheadedness/syncope, CP, SOB, abd pain, nausea, vomiting, diarrhea, constipation, dysuria, hematuria, malodorous urine, numbness, tingling, weakness, rashes, myalgias, arthalgias, or any other symptoms.   Past Medical History  Diagnosis Date  . Hypertension   . Mitral regurgitation   . Depression   . Gout   . Anemia, iron deficiency   . Dysmenorrhea   . Menorrhagia   . Pneumonia   . Diabetes mellitus without complication (HCC)   . Heart valve problem   . Arthritis    Past Surgical History  Procedure Laterality Date  . Knee arthroscopy      Left   Family History  Problem Relation Age of Onset  . Heart disease Sister   . Heart disease Brother   . Hypertension Mother   . Stroke Paternal Grandmother    Social History  Substance Use Topics  . Smoking status: Current Every Day Smoker -- 0.50 packs/day for 5 years    Types: Cigarettes  . Smokeless tobacco: Never Used  . Alcohol Use: No   OB History    Gravida Para Term Preterm AB TAB SAB Ectopic Multiple Living   3 1  1 2 1 1   1      Review of Systems  Constitutional: Negative for fever and chills.  Eyes: Negative for blurred vision and visual disturbance.  Respiratory: Negative for shortness of breath.  Cardiovascular: Negative for chest pain and syncope.  Gastrointestinal: Negative for nausea, vomiting, abdominal pain, diarrhea and constipation.  Genitourinary: Positive for frequency. Negative for dysuria and hematuria.  Musculoskeletal: Negative for myalgias, arthralgias, neck pain and neck stiffness.  Skin: Negative for color change and rash.  Allergic/Immunologic: Positive for immunocompromised state (diabetic).  Neurological:  Positive for headaches (resolved). Negative for focal weakness, syncope, weakness, light-headedness, numbness and paresthesias.  Psychiatric/Behavioral: Negative for confusion.  10 Systems reviewed and all are negative for acute change except as noted in the HPI.    Allergies  Review of patient's allergies indicates no known allergies.  Home Medications   Prior to Admission medications   Medication Sig Start Date End Date Taking? Authorizing Provider  amLODipine (NORVASC) 10 MG tablet Take 1 tablet (10 mg total) by mouth daily. 07/10/15   Veryl Speak, FNP  empagliflozin (JARDIANCE) 10 MG TABS tablet Take 10 mg by mouth daily.    Historical Provider, MD  estradiol (ESTRACE) 1 MG tablet TAKE ONE TABLET BY MOUTH ONCE DAILY 11/07/15   Adam Phenix, MD  hydrochlorothiazide 25 MG tablet Take 25 mg by mouth daily.      Historical Provider, MD  hydrOXYzine (ATARAX/VISTARIL) 50 MG tablet Take 50 mg by mouth 3 (three) times daily as needed for itching.    Historical Provider, MD  losartan (COZAAR) 100 MG tablet Take 1 tablet (100 mg total) by mouth daily. 07/10/15   Veryl Speak, FNP  medroxyPROGESTERone (PROVERA) 5 MG tablet TAKE ONE TABLET BY MOUTH ONCE DAILY 11/07/15   Adam Phenix, MD  meloxicam (MOBIC) 15 MG tablet Take 1 tablet (15 mg total) by mouth daily. 10/18/15   Veryl Speak, FNP  metFORMIN (GLUCOPHAGE) 500 MG tablet Take 500 mg by mouth daily.     Historical Provider, MD  methocarbamol (ROBAXIN) 500 MG tablet Take 2 tablets (1,000 mg total) by mouth 3 (three) times daily as needed for muscle spasms. 10/18/15   Veryl Speak, FNP  oxyCODONE-acetaminophen (PERCOCET) 5-325 MG tablet Take 1 tablet by mouth every 6 (six) hours as needed for severe pain. 12/06/15   Priscella Donna Camprubi-Soms, PA-C  QUEtiapine (SEROQUEL) 50 MG tablet Take 50 mg by mouth at bedtime.    Historical Provider, MD  rOPINIRole (REQUIP) 1 MG tablet Take 1 tablet (1 mg total) by mouth daily. 07/10/15   Veryl Speak, FNP  sitaGLIPtin (JANUVIA) 100 MG tablet Take 100 mg by mouth daily.    Historical Provider, MD  traMADol (ULTRAM) 50 MG tablet Take 1 tablet (50 mg total) by mouth every 6 (six) hours as needed. 10/18/15   Veryl Speak, FNP  venlafaxine (EFFEXOR) 75 MG tablet Take 75 mg by mouth 3 (three) times daily with meals.    Historical Provider, MD   BP 161/94 mmHg  Pulse 85  Temp(Src) 98.2 F (36.8 C) (Oral)  Resp 18  SpO2 98% Physical Exam  Constitutional: She is oriented to person, place, and time. Vital signs are normal. She appears well-developed and well-nourished.  Non-toxic appearance. No distress.  Afebrile, nontoxic, NAD  HENT:  Head: Normocephalic and atraumatic.  Mouth/Throat: Oropharynx is clear and moist and mucous membranes are normal.  Eyes: Conjunctivae and EOM are normal. Pupils are equal, round, and reactive to light. Right eye exhibits no discharge. Left eye exhibits no discharge.  PERRL, EOMI, no nystagmus, no visual field deficits   Neck: Normal range of motion. Neck supple. No spinous process tenderness and no muscular  tenderness present. No rigidity. Normal range of motion present.  FROM intact without spinous process TTP, no bony stepoffs or deformities, no paraspinous muscle TTP or muscle spasms. No rigidity or meningeal signs. No bruising or swelling.   Cardiovascular: Normal rate, regular rhythm and intact distal pulses.  Exam reveals no gallop and no friction rub.   Murmur heard. Mitral regurgitation murmur auscultated   Pulmonary/Chest: Effort normal and breath sounds normal. No respiratory distress. She has no decreased breath sounds. She has no wheezes. She has no rhonchi. She has no rales.  Abdominal: Soft. Normal appearance and bowel sounds are normal. She exhibits no distension. There is no tenderness. There is no rigidity, no rebound, no guarding, no CVA tenderness, no tenderness at McBurney's point and negative Murphy's sign.  Musculoskeletal: Normal  range of motion.  MAE x4 Strength and sensation grossly intact Distal pulses intact Gait steady  Neurological: She is alert and oriented to person, place, and time. She has normal strength. No cranial nerve deficit or sensory deficit. Coordination and gait normal. GCS eye subscore is 4. GCS verbal subscore is 5. GCS motor subscore is 6.  CN 2-12 grossly intact A&O x4 GCS 15 Sensation and strength intact Gait nonataxic Coordination with finger-to-nose WNL Neg pronator drift   Skin: Skin is warm, dry and intact. No rash noted.  Psychiatric: She has a normal mood and affect.  Nursing note and vitals reviewed.   ED Course  Procedures   DIAGNOSTIC STUDIES: Oxygen Saturation is 98% on RA, normal by my interpretation.    COORDINATION OF CARE: 5:52 PM Will prescribe short course of Percocet for pt to use as needed. Discussed treatment plan with pt at bedside and pt agreed to plan.   Labs Review Labs Reviewed  URINALYSIS, ROUTINE W REFLEX MICROSCOPIC (NOT AT [facility]) - Abnormal; Notable for the following:    Hgb urine dipstick TRACE (*)    All other components within normal limits  URINE MICROSCOPIC-ADD ON - Abnormal; Notable for the following:    Squamous Epithelial / LPF 0-5 (*)    Bacteria, UA FEW (*)    All other components within normal limits  CBG MONITORING, ED - Abnormal; Notable for the following:    Glucose-Capillary 126 (*)    All other components within normal limits  URINE CULTURE    Imaging Review No results found. I have personally reviewed and evaluated these images and lab results as part of my medical decision-making.   EKG Interpretation None      MDM   Final diagnoses:  Acute nonintractable headache, unspecified headache type  Type 2 diabetes mellitus with hyperglycemia, without long-term current use of insulin (HCC)  Urinary frequency  Hematuria, microscopic    55 y.o. female here with recurrent headache similar to prior headaches, resolved after  percocet given in triage; also complaining of urinary frequency for many months, worse last night. No red flag s/sx for her headache, doubt concerning secondary etiology, doubt SAH, CVA/ temporal arteritis/etc. States it's similar to headaches she's had before. No focal neuro deficits on exam. U/A done in triage shows some microscopic hematuria, few bacteria, some squamous cells. Doubt UTI, pt without symptoms of this, will send for culture but will hold off on tx. Discussed avoidance of caffeine since this can cause headache and urinary frequency. F/up with PCP for both of these issues. Will give small supply of percocet to have since this helped, but discussed tylenol/motrin being first line medications for headache. Avoid known triggers. I  explained the diagnosis and have given explicit precautions to return to the ER including for any other new or worsening symptoms. The patient understands and accepts the medical plan as it's been dictated and I have answered their questions. Discharge instructions concerning home care and prescriptions have been given. The patient is STABLE and is discharged to home in good condition.   I personally performed the services described in this documentation, which was scribed in my presence. The recorded information has been reviewed and is accurate.  BP 161/94 mmHg  Pulse 85  Temp(Src) 98.2 F (36.8 C) (Oral)  Resp 18  SpO2 98%  Meds ordered this encounter  Medications  . oxyCODONE-acetaminophen (PERCOCET/ROXICET) 5-325 MG per tablet 1 tablet    Sig:   . oxyCODONE-acetaminophen (PERCOCET/ROXICET) 5-325 MG per tablet    Sig:     Orie FishermanGoss, Christina   : cabinet override  . oxyCODONE-acetaminophen (PERCOCET) 5-325 MG tablet    Sig: Take 1 tablet by mouth every 6 (six) hours as needed for severe pain.    Dispense:  6 tablet    Refill:  0    Order Specific Question:  Supervising Provider    Answer:  Eber HongMILLER, BRIAN [[id number]]      Nasira Janusz Camprubi-Soms,  PA-C 12/06/15 1818  Gerhard Munchobert Lockwood, MD 12/06/15 2310

## 2015-12-06 NOTE — ED Notes (Signed)
Pt is in stable condition upon d/c and ambulates from ED. 

## 2015-12-07 LAB — URINE CULTURE: Culture: 50000 — AB

## 2015-12-08 NOTE — Progress Notes (Signed)
ED Antimicrobial Stewardship Positive Culture Follow Up   Stephanie Joseph is an 55 y.o. female who [facility] with a chief complaint of  Chief Complaint  Patient presents with  . Headache    Recent Results (from the past 720 hour(s))  Urine culture     Status: Abnormal   Collection Time: 12/06/15  4:38 PM  Result Value Ref Range Status   Specimen Description URINE, CLEAN CATCH  Final   Special Requests NONE  Final   Culture (A)  Final    50,000 COLONIES/mL LACTOBACILLUS SPECIES Standardized susceptibility testing for this organism is not available.    Report Status 12/07/2015 FINAL  Final    Her only urinary symptom documented was increased frequency.  Urine culture reveals 50K of Lactobacillus - this is a contaminant.  No antibiotics were prescribed and none are needed.   Sallee Provencalurner, Alondria Mousseau S 12/08/2015, 8:55 AM Infectious Diseases Pharmacist Phone# [phone]

## 2015-12-10 ENCOUNTER — Ambulatory Visit

## 2015-12-24 ENCOUNTER — Other Ambulatory Visit: Payer: Self-pay | Admitting: Obstetrics & Gynecology

## 2015-12-25 ENCOUNTER — Telehealth: Payer: Self-pay | Admitting: Family

## 2015-12-25 DIAGNOSIS — M545 Low back pain: Secondary | ICD-10-CM

## 2015-12-25 NOTE — Telephone Encounter (Signed)
Please advise 

## 2015-12-25 NOTE — Telephone Encounter (Signed)
Pt stated that she is done with the PT, she is wondering when she can get the MRI? Please call her back

## 2015-12-25 NOTE — Telephone Encounter (Signed)
If she continues to have low back pain, than I will send in the order for the MRI and referral to a back specialist.

## 2015-12-26 NOTE — Telephone Encounter (Signed)
Patient is requesting orders to be entered for both

## 2015-12-26 NOTE — Telephone Encounter (Signed)
LVM for pt to call back.

## 2015-12-26 NOTE — Telephone Encounter (Signed)
Orders have been placed.

## 2016-01-03 ENCOUNTER — Ambulatory Visit
Admission: RE | Admit: 2016-01-03 | Discharge: 2016-01-03 | Disposition: A | Discharge status: Home / Self Care | Source: Ambulatory Visit | Attending: Family | Admitting: Family

## 2016-01-03 DIAGNOSIS — M545 Low back pain: Secondary | ICD-10-CM

## 2016-01-04 ENCOUNTER — Ambulatory Visit (INDEPENDENT_AMBULATORY_CARE_PROVIDER_SITE_OTHER)

## 2016-01-04 DIAGNOSIS — E538 Deficiency of other specified B group vitamins: Secondary | ICD-10-CM

## 2016-01-04 MED ORDER — CYANOCOBALAMIN 1000 MCG/ML IJ SOLN
1000.0000 ug | Freq: Once | INTRAMUSCULAR | Status: AC
  Administered 2016-01-04: 1000 ug via INTRAMUSCULAR

## 2016-01-13 ENCOUNTER — Telehealth: Payer: Self-pay | Admitting: Family

## 2016-01-15 NOTE — Telephone Encounter (Signed)
Done hardcopy to Corinne  

## 2016-01-15 NOTE — Telephone Encounter (Signed)
Refill sent to pharmacy.   

## 2016-01-25 NOTE — Telephone Encounter (Signed)
Re-sent to pharmacy.

## 2016-01-25 NOTE — Telephone Encounter (Signed)
Spoke to pt and she states [facility] has not gotten this prescription. Please resend to the one on [address] [facility] [address]

## 2016-01-28 ENCOUNTER — Other Ambulatory Visit: Payer: Self-pay | Admitting: Obstetrics & Gynecology

## 2016-01-31 ENCOUNTER — Telehealth: Payer: Self-pay | Admitting: *Deleted

## 2016-01-31 DIAGNOSIS — Z780 Asymptomatic menopausal state: Secondary | ICD-10-CM

## 2016-01-31 NOTE — Telephone Encounter (Signed)
Error

## 2016-01-31 NOTE — Telephone Encounter (Signed)
Received call left on nurse voicemail by patient on 01/30/16 at 1426.  Patient states she needs a refill on her medication.  Requests more than 1 refill at a time.  Requests a return call to [phone].

## 2016-02-04 MED ORDER — MEDROXYPROGESTERONE ACETATE 5 MG PO TABS: 5.0000 mg | ORAL_TABLET | Freq: Every day | ORAL | 0 refills | Status: DC

## 2016-02-04 MED ORDER — ESTRADIOL 1 MG PO TABS: 1.0000 mg | ORAL_TABLET | Freq: Every day | ORAL | 0 refills | Status: DC

## 2016-02-04 NOTE — Telephone Encounter (Signed)
Patient called back into front office speaking with Darl Pikes requesting refills on provera & estrace. Per chart review, patient has not been seen since 3/16. Per Dr Debroah Loop, may provide one refill but patient needs to be seen before additional refills can be given. Patient could go to [facility] to see Dr Debroah Loop. Darl Pikes relayed all information to patient. Patient verbalized understanding & had no questions. Patient was provided [facility]'s contact info.

## 2016-02-14 ENCOUNTER — Ambulatory Visit (INDEPENDENT_AMBULATORY_CARE_PROVIDER_SITE_OTHER): Admitting: Obstetrics & Gynecology

## 2016-02-14 ENCOUNTER — Encounter: Payer: Self-pay | Admitting: Obstetrics & Gynecology

## 2016-02-14 VITALS — BP 132/87 | HR 84 | Ht 67.0 in | Wt 214.0 lb

## 2016-02-14 DIAGNOSIS — Z01419 Encounter for gynecological examination (general) (routine) without abnormal findings: Secondary | ICD-10-CM

## 2016-02-14 DIAGNOSIS — Z79890 Hormone replacement therapy (postmenopausal): Secondary | ICD-10-CM

## 2016-02-14 DIAGNOSIS — Z780 Asymptomatic menopausal state: Secondary | ICD-10-CM

## 2016-02-14 DIAGNOSIS — R350 Frequency of micturition: Secondary | ICD-10-CM

## 2016-02-14 MED ORDER — MEDROXYPROGESTERONE ACETATE 5 MG PO TABS: 5.0000 mg | ORAL_TABLET | Freq: Every day | ORAL | 12 refills | Status: DC

## 2016-02-14 MED ORDER — OXYBUTYNIN CHLORIDE ER 5 MG PO TB24: 5.0000 mg | ORAL_TABLET | Freq: Every day | ORAL | 6 refills | Status: DC

## 2016-02-14 MED ORDER — ESTRADIOL 1 MG PO TABS: 1.0000 mg | ORAL_TABLET | Freq: Every day | ORAL | 12 refills | Status: DC

## 2016-02-14 NOTE — Progress Notes (Signed)
Patient ID: Stephanie Joseph, female   DOB: [birth year], 55 y.o.   MRN: [id number]  QM:VHQIONCc:annual gyn exam and refill HRT  HPI Stephanie Joseph is a 55 y.o. female.  [id number] Patient's last menstrual period was 02/13/2014 (within days). Doing well on HRT for postmenopausal symptoms and she would like to continue to take this. HPI  Past Medical History:  Diagnosis Date  . Anemia, iron deficiency   . Arthritis   . Depression   . Diabetes mellitus without complication (HCC)   . Dysmenorrhea   . Gout   . Heart valve problem   . Hypertension   . Menorrhagia   . Mitral regurgitation   . Pneumonia     Past Surgical History:  Procedure Laterality Date  . KNEE ARTHROSCOPY     Left    Family History  Problem Relation Age of Onset  . Hypertension Mother   . Stroke Paternal Grandmother   . Heart disease Sister   . Heart disease Brother     Social History Social History  Substance Use Topics  . Smoking status: Current Every Day Smoker    Packs/day: 0.50    Years: 5.00    Types: Cigarettes  . Smokeless tobacco: Never Used  . Alcohol use No    No Known Allergies  Current Outpatient Prescriptions  Medication Sig Dispense Refill  . amLODipine (NORVASC) 10 MG tablet Take 1 tablet (10 mg total) by mouth daily. 90 tablet 1  . empagliflozin (JARDIANCE) 10 MG TABS tablet Take 10 mg by mouth daily.    Marland Kitchen. estradiol (ESTRACE) 1 MG tablet Take 1 tablet (1 mg total) by mouth daily. 30 tablet 12  . hydrochlorothiazide 25 MG tablet Take 25 mg by mouth daily.      . hydrOXYzine (ATARAX/VISTARIL) 50 MG tablet Take 50 mg by mouth 3 (three) times daily as needed for itching.    . losartan (COZAAR) 100 MG tablet Take 1 tablet (100 mg total) by mouth daily. 90 tablet 1  . medroxyPROGESTERone (PROVERA) 5 MG tablet Take 1 tablet (5 mg total) by mouth daily. 30 tablet 12  . meloxicam (MOBIC) 15 MG tablet Take 1 tablet (15 mg total) by mouth daily. 30 tablet 0  . metFORMIN (GLUCOPHAGE) 500 MG tablet Take  500 mg by mouth daily.     . methocarbamol (ROBAXIN) 500 MG tablet TAKE TWO TABLETS BY MOUTH THREE TIMES DAILY AS NEEDED FOR MUSCLE SPASMS 90 tablet 0  . oxyCODONE-acetaminophen (PERCOCET) 5-325 MG tablet Take 1 tablet by mouth every 6 (six) hours as needed for severe pain. 6 tablet 0  . QUEtiapine (SEROQUEL) 50 MG tablet Take 50 mg by mouth at bedtime.    Marland Kitchen. rOPINIRole (REQUIP) 1 MG tablet Take 1 tablet (1 mg total) by mouth daily. 90 tablet 1  . sitaGLIPtin (JANUVIA) 100 MG tablet Take 100 mg by mouth daily.    . traMADol (ULTRAM) 50 MG tablet TAKE ONE TABLET BY MOUTH EVERY 6 HOURS AS NEEDED 120 tablet 0  . venlafaxine (EFFEXOR) 75 MG tablet Take 75 mg by mouth 3 (three) times daily with meals.    Marland Kitchen. oxybutynin (DITROPAN-XL) 5 MG 24 hr tablet Take 1 tablet (5 mg total) by mouth at bedtime. 30 tablet 6   No current facility-administered medications for this visit.     Review of Systems Review of Systems  Constitutional: Negative.   Respiratory: Negative.   Cardiovascular: Negative.   Gastrointestinal: Negative.   Genitourinary: Positive for frequency and urgency. Negative for  vaginal bleeding and vaginal discharge.    Blood pressure 132/87, pulse 84, height  (1.702 m), weight 214 lb (97.1 kg), last menstrual period 02/13/2014.  Physical Exam Physical Exam  Constitutional: She is oriented to person, place, and time. She appears well-developed. No distress.  Cardiovascular: Normal rate.   Pulmonary/Chest: Effort normal. No respiratory distress.  Breasts: breasts appear normal, no suspicious masses, no skin or nipple changes or axillary nodes.   Abdominal: Soft. She exhibits no distension and no mass. There is no tenderness.  Genitourinary: Vagina normal and uterus normal. No vaginal discharge found.  Genitourinary Comments: No masses  Neurological: She is alert and oriented to person, place, and time.  Skin: Skin is warm and dry.  Psychiatric: She has a normal mood and affect.  Her behavior is normal.  Vitals reviewed.   Data Reviewed Pap and mammogram result from 2016 normal UA today negative Assessment    Well woman exam postmenopause Doing well on HRT OAB symptom w/o evidence of UTI    Plan    Refill rx for provera and estrace Oxybutinin 5 mg ER po HS RTC to review her treatment for OAB       Tristen Luce 02/14/2016, 11:41 AM

## 2016-03-05 LAB — HM DIABETES EYE EXAM

## 2016-03-20 ENCOUNTER — Other Ambulatory Visit: Payer: Self-pay | Admitting: Internal Medicine

## 2016-04-11 ENCOUNTER — Other Ambulatory Visit: Payer: Self-pay | Admitting: Internal Medicine

## 2016-04-19 ENCOUNTER — Other Ambulatory Visit: Payer: Self-pay | Admitting: Internal Medicine

## 2016-04-24 ENCOUNTER — Emergency Department (HOSPITAL_COMMUNITY)

## 2016-04-24 ENCOUNTER — Encounter (HOSPITAL_COMMUNITY): Payer: Self-pay | Admitting: Family Medicine

## 2016-04-24 ENCOUNTER — Emergency Department (HOSPITAL_COMMUNITY)
Admission: EM | Admit: 2016-04-24 | Discharge: 2016-04-24 | Disposition: A | Discharge status: Home / Self Care | Attending: Emergency Medicine | Admitting: Emergency Medicine

## 2016-04-24 DIAGNOSIS — Z5321 Procedure and treatment not carried out due to patient leaving prior to being seen by health care provider: Secondary | ICD-10-CM | POA: Insufficient documentation

## 2016-04-24 DIAGNOSIS — R079 Chest pain, unspecified: Secondary | ICD-10-CM | POA: Diagnosis present

## 2016-04-24 LAB — CBC
HCT: 37.9 % (ref 36.0–46.0)
Hemoglobin: 12.2 g/dL (ref 12.0–15.0)
MCH: 27 pg (ref 26.0–34.0)
MCHC: 32.2 g/dL (ref 30.0–36.0)
MCV: 83.8 fL (ref 78.0–100.0)
Platelets: 343 10*3/uL (ref 150–400)
RBC: 4.52 MIL/uL (ref 3.87–5.11)
RDW: 14.7 % (ref 11.5–15.5)
WBC: 7.7 10*3/uL (ref 4.0–10.5)

## 2016-04-24 LAB — BASIC METABOLIC PANEL
Anion gap: 9 (ref 5–15)
BUN: 14 mg/dL (ref 6–20)
CO2: 26 mmol/L (ref 22–32)
Calcium: 9.1 mg/dL (ref 8.9–10.3)
Chloride: 106 mmol/L (ref 101–111)
Creatinine, Ser: 0.88 mg/dL (ref 0.44–1.00)
GFR calc Af Amer: >60 mL/min (ref >60)
GFR calc non Af Amer: >60 mL/min (ref >60)
Glucose, Bld: 129 mg/dL — ABNORMAL HIGH (ref 65–99)
Potassium: 4 mmol/L (ref 3.5–5.1)
Sodium: 141 mmol/L (ref 135–145)

## 2016-04-24 NOTE — ED Triage Notes (Signed)
Patient reports she was lying down when she developed left sided chest pain with no radiation. Pt took TUMS but didn't relieve the symptoms. Pt reports this pain happened two nights ago but went away. Pt reports the pain is worse tonight than the 2 nights ago.

## 2016-04-24 NOTE — ED Notes (Signed)
Pt sts she passed gas and is feeling better. So she is leaving

## 2016-06-12 ENCOUNTER — Other Ambulatory Visit: Payer: Self-pay | Admitting: Family

## 2016-06-12 MED ORDER — LOSARTAN POTASSIUM 100 MG PO TABS: 100.0000 mg | ORAL_TABLET | Freq: Every day | ORAL | 0 refills | Status: DC

## 2016-06-12 MED ORDER — AMLODIPINE BESYLATE 10 MG PO TABS: 10.0000 mg | ORAL_TABLET | Freq: Every day | ORAL | 0 refills | Status: DC

## 2016-06-12 NOTE — Telephone Encounter (Signed)
Left msg on triage stating she is needing refill on her Bp meds pt is due for yearly CPX in January sent a 30 day until appt...Raechel Chute/lmb

## 2016-08-24 NOTE — Progress Notes (Deleted)
Patient ID: Stephanie Joseph, female   DOB: Sep 13, 1960, 56 y.o.   MRN: [id number]   56 y.o.  referred for murmur and abnormal echo 06/2014 . Reviewed echo read by Dr Lynnea FerrierSolomon in September 2013 . He indicated severe MR with no frank prolapse or flail. LV size and funciton normal. 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 Denies appetite suppresant use, RF, SBE. 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  I reviewed the echo and thought the MR was more moderate and in the absence of symptoms or clearly repairable lesion there was No indication for surgery.    Echo :   Study Conclusions  - Left ventricle: The cavity size was at the lower limits of   normal. Systolic function was normal. The estimated ejection   fraction was in the range of 50% to 55%. Wall motion was normal;   there were no regional wall motion abnormalities. Features are   consistent with a pseudonormal left ventricular filling pattern,   with concomitant abnormal relaxation and increased filling   pressure (grade 2 diastolic dysfunction). - Aortic valve: There was moderate regurgitation directed centrally   in the LVOT. - Mitral valve: There was moderate regurgitation directed   centrally. - Left atrium: The atrium was mildly dilated.  ROS: Denies fever, malais, weight loss, blurry vision, decreased visual acuity, cough, sputum, SOB, hemoptysis, pleuritic pain, palpitaitons, heartburn, abdominal pain, melena, lower extremity edema, claudication, or rash.  All other systems reviewed and negative  General: Affect appropriate Overweight black female  HEENT: normal Neck supple with no adenopathy JVP normal no bruits no thyromegaly Lungs clear with no wheezing and good diaphragmatic motion Heart:  S1/S2 mild SEM / AR  murmur, no rub, gallop or click PMI normal Abdomen: benighn, BS positve, no tenderness, no AAA no bruit.  No HSM or HJR Distal pulses intact with  no bruits No edema Neuro non-focal Skin warm and dry No muscular weakness   Current Outpatient Prescriptions  Medication Sig Dispense Refill  . amLODipine (NORVASC) 10 MG tablet Take 1 tablet (10 mg total) by mouth daily. Yearly physical due in January must see MD for refills 30 tablet 0  . empagliflozin (JARDIANCE) 10 MG TABS tablet Take 10 mg by mouth daily.    Marland Kitchen. estradiol (ESTRACE) 1 MG tablet Take 1 tablet (1 mg total) by mouth daily. 30 tablet 12  . hydrochlorothiazide 25 MG tablet Take 25 mg by mouth daily.      . hydrOXYzine (ATARAX/VISTARIL) 50 MG tablet Take 50 mg by mouth 3 (three) times daily as needed for itching.    . losartan (COZAAR) 100 MG tablet Take 1 tablet (100 mg total) by mouth daily. Yearly physical due in January must see MD for refills 30 tablet 0  . medroxyPROGESTERone (PROVERA) 5 MG tablet Take 1 tablet (5 mg total) by mouth daily. 30 tablet 12  . meloxicam (MOBIC) 15 MG tablet Take 1 tablet (15 mg total) by mouth daily. 30 tablet 0  . metFORMIN (GLUCOPHAGE) 500 MG tablet Take 500 mg by mouth daily.     . methocarbamol (ROBAXIN) 500 MG tablet TAKE TWO TABLETS BY MOUTH THREE TIMES DAILY AS NEEDED FOR MUSCLE SPASM 90 tablet 0  . oxybutynin (DITROPAN-XL) 5 MG 24 hr tablet Take 1 tablet (5 mg total) by mouth at bedtime. 30 tablet 6  . oxyCODONE-acetaminophen (PERCOCET) 5-325 MG tablet Take 1 tablet by mouth every 6 (  six) hours as needed for severe pain. 6 tablet 0  . QUEtiapine (SEROQUEL) 50 MG tablet Take 50 mg by mouth at bedtime.    Marland Kitchen rOPINIRole (REQUIP) 1 MG tablet Take 1 tablet (1 mg total) by mouth daily. 90 tablet 1  . sitaGLIPtin (JANUVIA) 100 MG tablet Take 100 mg by mouth daily.    . traMADol (ULTRAM) 50 MG tablet TAKE ONE TABLET BY MOUTH EVERY 6 HOURS AS NEEDED 120 tablet 0  . venlafaxine (EFFEXOR) 75 MG tablet Take 75 mg by mouth 3 (three) times daily with meals.     No current facility-administered medications for this visit.     Allergies  Patient  has no known allergies.  Electrocardiogram:  5/15  SR rate 67 normal  07/27/15  SR rate 73  Normal   Assessment and Plan MR: moderate update echo AR:  Moderate update echo Continue ARB for afterload reduction  Bipolar: stable continue current meds [id number] low carb diet.  Target hemoglobin A1c is 6.5 or less.  Continue current medications. HTN: Well controlled.  Continue current medications and low sodium Dash type diet.    F/u with me in a year  If echo ok    Charlton Haws

## 2016-09-04 ENCOUNTER — Ambulatory Visit: Admitting: Cardiovascular Disease

## 2016-09-15 ENCOUNTER — Encounter: Admitting: Family

## 2016-09-19 ENCOUNTER — Other Ambulatory Visit: Payer: Self-pay | Admitting: Family

## 2016-09-19 ENCOUNTER — Encounter: Payer: Self-pay | Admitting: Family

## 2016-09-19 ENCOUNTER — Ambulatory Visit (INDEPENDENT_AMBULATORY_CARE_PROVIDER_SITE_OTHER): Admitting: Family

## 2016-09-19 ENCOUNTER — Other Ambulatory Visit (INDEPENDENT_AMBULATORY_CARE_PROVIDER_SITE_OTHER)

## 2016-09-19 VITALS — BP 130/78 | HR 70 | Temp 98.2°F | Resp 16 | Ht 67.0 in | Wt 215.0 lb

## 2016-09-19 DIAGNOSIS — Z0000 Encounter for general adult medical examination without abnormal findings: Secondary | ICD-10-CM

## 2016-09-19 DIAGNOSIS — F17200 Nicotine dependence, unspecified, uncomplicated: Secondary | ICD-10-CM

## 2016-09-19 DIAGNOSIS — Z6833 Body mass index (BMI) 33.0-33.9, adult: Secondary | ICD-10-CM

## 2016-09-19 DIAGNOSIS — E119 Type 2 diabetes mellitus without complications: Secondary | ICD-10-CM

## 2016-09-19 DIAGNOSIS — E6609 Other obesity due to excess calories: Secondary | ICD-10-CM | POA: Diagnosis not present

## 2016-09-19 DIAGNOSIS — Z0001 Encounter for general adult medical examination with abnormal findings: Secondary | ICD-10-CM | POA: Insufficient documentation

## 2016-09-19 DIAGNOSIS — E559 Vitamin D deficiency, unspecified: Secondary | ICD-10-CM

## 2016-09-19 LAB — COMPREHENSIVE METABOLIC PANEL
ALT: 7 U/L (ref 0–35)
AST: 10 U/L (ref 0–37)
Albumin: 4 g/dL (ref 3.5–5.2)
Alkaline Phosphatase: 70 U/L (ref 39–117)
BUN: 9 mg/dL (ref 6–23)
CO2: 26 mEq/L (ref 19–32)
Calcium: 9.1 mg/dL (ref 8.4–10.5)
Chloride: 105 mEq/L (ref 96–112)
Creatinine, Ser: 0.9 mg/dL (ref 0.40–1.20)
GFR: 83.33 mL/min (ref >60.00)
Glucose, Bld: 108 mg/dL — ABNORMAL HIGH (ref 70–99)
Potassium: 4.2 mEq/L (ref 3.5–5.1)
Sodium: 135 mEq/L (ref 135–145)
Total Bilirubin: 0.3 mg/dL (ref 0.2–1.2)
Total Protein: 6.9 g/dL (ref 6.0–8.3)

## 2016-09-19 LAB — CBC
HCT: 37 % (ref 36.0–46.0)
Hemoglobin: 12.1 g/dL (ref 12.0–15.0)
MCHC: 32.6 g/dL (ref 30.0–36.0)
MCV: 83 fl (ref 78.0–100.0)
Platelets: 287 10*3/uL (ref 150.0–400.0)
RBC: 4.46 Mil/uL (ref 3.87–5.11)
RDW: 14.8 % (ref 11.5–15.5)
WBC: 5.5 10*3/uL (ref 4.0–10.5)

## 2016-09-19 LAB — MICROALBUMIN / CREATININE URINE RATIO
Creatinine,U: 123.1 mg/dL
Microalb Creat Ratio: 0.6 mg/g (ref 0.0–30.0)
Microalb, Ur: <0.7 mg/dL (ref 0.0–1.9)

## 2016-09-19 LAB — HEMOGLOBIN A1C: Hgb A1c MFr Bld: 6.4 % (ref 4.6–6.5)

## 2016-09-19 LAB — LIPID PANEL
Cholesterol: 123 mg/dL (ref 0–200)
HDL: 52.6 mg/dL (ref >39.00)
LDL Cholesterol: 53 mg/dL (ref 0–99)
NonHDL: 70.75
Total CHOL/HDL Ratio: 2
Triglycerides: 91 mg/dL (ref 0.0–149.0)
VLDL: 18.2 mg/dL (ref 0.0–40.0)

## 2016-09-19 LAB — VITAMIN D 25 HYDROXY (VIT D DEFICIENCY, FRACTURES): VITD: 12.89 ng/mL — ABNORMAL LOW (ref 30.00–100.00)

## 2016-09-19 MED ORDER — METHOCARBAMOL 500 MG PO TABS: ORAL_TABLET | ORAL | 2 refills | Status: DC

## 2016-09-19 MED ORDER — GLUCOSE BLOOD VI STRP: ORAL_STRIP | 2 refills | Status: DC

## 2016-09-19 MED ORDER — ACCU-CHEK SOFT TOUCH LANCETS MISC: 12 refills | Status: DC

## 2016-09-19 MED ORDER — VITAMIN D3 1.25 MG (50000 UT) PO TABS: 1.0000 | ORAL_TABLET | ORAL | 0 refills | Status: DC

## 2016-09-19 MED ORDER — TRAMADOL HCL 50 MG PO TABS: 50.0000 mg | ORAL_TABLET | Freq: Four times a day (QID) | ORAL | 0 refills | Status: DC | PRN

## 2016-09-19 MED ORDER — AMLODIPINE BESYLATE 10 MG PO TABS: 10.0000 mg | ORAL_TABLET | Freq: Every day | ORAL | 1 refills | Status: DC

## 2016-09-19 MED ORDER — LOSARTAN POTASSIUM 100 MG PO TABS: 100.0000 mg | ORAL_TABLET | Freq: Every day | ORAL | 1 refills | Status: DC

## 2016-09-19 NOTE — Assessment & Plan Note (Signed)
BMI of 33. Recommend weight loss of 5-10% of current body weight through nutrition and physical activity changes. Encourage calorie counting and monitoring with goal of approximately 1200-1500 cal per day. Discussed importance of a nutritional intake that is moderate, balance, and varied. She does meet qualifications for weight loss medication as well as bariatric surgery if weight loss attempts fail. Recommend lifestyle attempts first with addition of medication if unsuccessful. Encouraged to also look into weight management program such as Doylene BodeJenny Craig, Weight Watchers, or Nutrisystem. Continue to monitor.

## 2016-09-19 NOTE — Progress Notes (Signed)
Subjective:    Patient ID: Stephanie Joseph, female    DOB: 08/21/1960, 56 y.o.   MRN: [id number]  Chief Complaint  Patient presents with  . CPE    fasting, states she had a colonoscopy     HPI:  Stephanie Joseph is a 56 y.o. female who presents today for an annual wellness visit.   1) Health Maintenance -   Diet - Averaging about 1-2 meals per day consisting of a regular diet; Caffeine intake 3-5 cups daily  Exercise - Walks 3x per week for about an hour per session.    2) Preventative Exams / Immunizations:  Dental -- Due for exam   Vision -- Up to date (2 weeks ago).    Health Maintenance  Topic Date Due  . Hepatitis C Screening  [birth year]  . PNEUMOCOCCAL POLYSACCHARIDE VACCINE (1) 10/29/1962  . FOOT EXAM  10/29/1970  . HIV Screening  10/29/1975  . TETANUS/TDAP  10/29/1979  . HEMOGLOBIN A1C  05/31/2016  . INFLUENZA VACCINE  10/04/2016 (Originally 02/05/2016)  . MAMMOGRAM  01/01/2017  . OPHTHALMOLOGY EXAM  09/04/2017  . PAP SMEAR  10/03/2017  . COLONOSCOPY  12/09/2025     There is no immunization history on file for this patient.   No Known Allergies   Outpatient Medications Prior to Visit  Medication Sig Dispense Refill  . hydrochlorothiazide 25 MG tablet Take 25 mg by mouth daily.      . hydrOXYzine (ATARAX/VISTARIL) 50 MG tablet Take 50 mg by mouth 3 (three) times daily as needed for itching.    . medroxyPROGESTERone (PROVERA) 5 MG tablet Take 1 tablet (5 mg total) by mouth daily. 30 tablet 12  . metFORMIN (GLUCOPHAGE) 500 MG tablet Take 500 mg by mouth daily.     Marland Kitchen oxybutynin (DITROPAN-XL) 5 MG 24 hr tablet Take 1 tablet (5 mg total) by mouth at bedtime. 30 tablet 6  . QUEtiapine (SEROQUEL) 50 MG tablet Take 50 mg by mouth at bedtime.    Marland Kitchen venlafaxine (EFFEXOR) 75 MG tablet Take 75 mg by mouth 3 (three) times daily with meals.    Marland Kitchen amLODipine (NORVASC) 10 MG tablet Take 1 tablet (10 mg total) by mouth daily. Yearly physical due in January must see MD  for refills 30 tablet 0  . empagliflozin (JARDIANCE) 10 MG TABS tablet Take 10 mg by mouth daily.    Marland Kitchen losartan (COZAAR) 100 MG tablet Take 1 tablet (100 mg total) by mouth daily. Yearly physical due in January must see MD for refills 30 tablet 0  . methocarbamol (ROBAXIN) 500 MG tablet TAKE TWO TABLETS BY MOUTH THREE TIMES DAILY AS NEEDED FOR MUSCLE SPASM 90 tablet 0  . traMADol (ULTRAM) 50 MG tablet TAKE ONE TABLET BY MOUTH EVERY 6 HOURS AS NEEDED 120 tablet 0  . estradiol (ESTRACE) 1 MG tablet Take 1 tablet (1 mg total) by mouth daily. 30 tablet 12  . meloxicam (MOBIC) 15 MG tablet Take 1 tablet (15 mg total) by mouth daily. 30 tablet 0  . oxyCODONE-acetaminophen (PERCOCET) 5-325 MG tablet Take 1 tablet by mouth every 6 (six) hours as needed for severe pain. 6 tablet 0  . rOPINIRole (REQUIP) 1 MG tablet Take 1 tablet (1 mg total) by mouth daily. 90 tablet 1  . sitaGLIPtin (JANUVIA) 100 MG tablet Take 100 mg by mouth daily.     No facility-administered medications prior to visit.      Past Medical History:  Diagnosis Date  . Anemia, iron deficiency   .  Arthritis   . Depression   . Diabetes mellitus without complication (HCC)   . Dysmenorrhea   . Gout   . Heart valve problem   . Hypertension   . Menorrhagia   . Mitral regurgitation   . Pneumonia      Past Surgical History:  Procedure Laterality Date  . KNEE ARTHROSCOPY     Left     Family History  Problem Relation Age of Onset  . Hypertension Mother   . Stroke Paternal Grandmother   . Heart disease Sister   . Heart disease Brother      Social History   Social History  . Marital status: Married    Spouse name: N/A  . Number of children: 1  . Years of education: 1912   Occupational History  . Disability Pending    Social History Main Topics  . Smoking status: Current Every Day Smoker    Packs/day: 0.50    Years: 7.00    Types: Cigarettes  . Smokeless tobacco: Never Used  . Alcohol use No  . Drug use: No    . Sexual activity: Yes    Birth control/ protection: None   Other Topics Concern  . Not on file   Social History Narrative   Fun: Go to church   Denies abuse and feels safe at home.       Review of Systems  Constitutional: Denies fever, chills, fatigue, or significant weight gain/loss. HENT: Head: Denies headache or neck pain Ears: Denies changes in hearing, ringing in ears, earache, drainage Nose: Denies discharge, stuffiness, itching, nosebleed, sinus pain Throat: Denies sore throat, hoarseness, dry mouth, sores, thrush Eyes: Denies loss/changes in vision, pain, redness, blurry/double vision, flashing lights Cardiovascular: Denies chest pain/discomfort, tightness, palpitations, shortness of breath with activity, difficulty lying down, swelling, sudden awakening with shortness of breath Respiratory: Denies shortness of breath, cough, sputum production, wheezing Gastrointestinal: Denies dysphasia, heartburn, change in appetite, nausea, change in bowel habits, rectal bleeding, constipation, diarrhea, yellow skin or eyes Genitourinary: Denies frequency, urgency, burning/pain, blood in urine, incontinence, change in urinary strength. Musculoskeletal: Denies muscle/joint pain, stiffness, back pain, redness or swelling of joints, trauma Skin: Denies rashes, lumps, itching, dryness, color changes, or hair/nail changes Neurological: Denies dizziness, fainting, seizures, weakness, numbness, tingling, tremor Psychiatric - Denies nervousness, stress, depression or memory loss Endocrine: Denies heat or cold intolerance, sweating, frequent urination, excessive thirst, changes in appetite Hematologic: Denies ease of bruising or bleeding     Objective:     BP 130/78 (BP Location: Left Arm, Patient Position: Sitting, Cuff Size: Large)   Pulse 70   Temp 98.2 F (36.8 C) (Oral)   Resp 16   Ht 5\' 7"  (1.702 m)   Wt 215 lb (97.5 kg)   LMP 02/13/2014 (Within Days)   SpO2 97%   BMI 33.67  kg/m  Nursing note and vital signs reviewed.  Physical Exam  Constitutional: She is oriented to person, place, and time. She appears well-developed and well-nourished.  HENT:  Head: Normocephalic.  Right Ear: Hearing, tympanic membrane, external ear and ear canal normal.  Left Ear: Hearing, tympanic membrane, external ear and ear canal normal.  Nose: Nose normal.  Mouth/Throat: Uvula is midline, oropharynx is clear and moist and mucous membranes are normal.  Eyes: Conjunctivae and EOM are normal. Pupils are equal, round, and reactive to light.  Neck: Neck supple. No JVD present. No tracheal deviation present. No thyromegaly present.  Cardiovascular: Normal rate, regular rhythm, normal heart sounds and  intact distal pulses.   Pulmonary/Chest: Effort normal and breath sounds normal.  Abdominal: Soft. Bowel sounds are normal. She exhibits no distension and no mass. There is no tenderness. There is no rebound and no guarding.  Musculoskeletal: Normal range of motion. She exhibits no edema or tenderness.  Lymphadenopathy:    She has no cervical adenopathy.  Neurological: She is alert and oriented to person, place, and time. She has normal reflexes. No cranial nerve deficit. She exhibits normal muscle tone. Coordination normal.  Diabetic Foot Exam - Simple   Simple Foot Form Diabetic Foot exam was performed with the following findings:  Yes  09/19/2016  1:25 PM  Visual Inspection No deformities, no ulcerations, no other skin breakdown bilaterally:  Yes Sensation Testing Intact to touch and monofilament testing bilaterally:  Yes Pulse Check Posterior Tibialis and Dorsalis pulse intact bilaterally:  Yes  Skin: Skin is warm and dry.  Psychiatric: She has a normal mood and affect. Her behavior is normal. Judgment and thought content normal.       Assessment & Plan:   Problem List Items Addressed This Visit      Endocrine   Type 2 diabetes mellitus (HCC)    Obtain hemoglobin A1c and  urine microalbumin. Maintained on losartan for CAD risk reduction. Diabetic foot exam completed today. Diabetic eye exam is up-to-date. Continue current dosage of metformin pending A1c results. Encouraged to monitor blood sugars at home 1-2 times per day. Follow-up pending A1c results.      Relevant Medications   losartan (COZAAR) 100 MG tablet     Other   TOBACCO ABUSE    Continued tobacco use with approximately one half pack per day. She is in the precontemplation stage of change. Discussed importance of tobacco cessation for reduction of risk of chronic disease in the future. Continue to monitor.      Routine adult health maintenance - Primary    1) Anticipatory Guidance: Discussed importance of wearing a seatbelt while driving and not texting while driving; changing batteries in smoke detector at least once annually; wearing suntan lotion when outside; eating a balanced and moderate diet; getting physical activity at least 30 minutes per day.  2) Immunizations / Screenings / Labs:  Declines tetanus, influenza, and Pneumovax. All other immunizations are up-to-date per recommendations. Declines hepatitis C screening. Colon cancer and cervical cancer screenings are up-to-date per recommendations. Breast cancer screening. Mammogram due in June encouraged to be completed independently. Diabetic foot exam completed today. Due for a dental exam encouraged to be completed independently. Obtain vitamin D for vitamin D deficiency screening. All other screenings are up-to-date per recommendations. Obtain CBC, CMET, and lipid profile.    Overall well exam with risk factors for cardiovascular disease including type 2 diabetes, tobacco use, obesity, and hypertension. Recommend weight loss of 5-10% of current body weight through nutrition and physical activity changes. Encouraged tobacco cessation. Recommend increasing physical activity to 30 minutes of moderate level activity daily. Continue other healthy  lifestyle behaviors and choices. Follow-up prevention exam in 1 year. Follow-up office visit pending blood work and for chronic conditions.       Relevant Orders   CBC   Comprehensive metabolic panel   Lipid panel   VITAMIN D 25 Hydroxy (Vit-D Deficiency, Fractures)   Urine Microalbumin w/creat. ratio   Hemoglobin A1c   Class 1 obesity due to excess calories with serious comorbidity and body mass index (BMI) of 33.0 to 33.9 in adult    BMI of 33.  Recommend weight loss of 5-10% of current body weight through nutrition and physical activity changes. Encourage calorie counting and monitoring with goal of approximately 1200-1500 cal per day. Discussed importance of a nutritional intake that is moderate, balance, and varied. She does meet qualifications for weight loss medication as well as bariatric surgery if weight loss attempts fail. Recommend lifestyle attempts first with addition of medication if unsuccessful. Encouraged to also look into weight management program such as Doylene Bode, Weight Watchers, or Nutrisystem. Continue to monitor.          I have discontinued Stephanie Joseph's sitaGLIPtin, empagliflozin, rOPINIRole, meloxicam, oxyCODONE-acetaminophen, and estradiol. I have also changed her traMADol, amLODipine, and losartan. Additionally, I am having her start on glucose blood and accu-chek soft touch. Lastly, I am having her maintain her hydrochlorothiazide, hydrOXYzine, venlafaxine, metFORMIN, QUEtiapine, medroxyPROGESTERone, oxybutynin, timolol, and methocarbamol.   Meds ordered this encounter  Medications  . timolol (BETIMOL) 0.25 % ophthalmic solution    Sig: 1-2 drops 2 (two) times daily.  . methocarbamol (ROBAXIN) 500 MG tablet    Sig: TAKE TWO TABLETS BY MOUTH THREE TIMES DAILY AS NEEDED FOR MUSCLE SPASM    Dispense:  90 tablet    Refill:  2    Order Specific Question:   Supervising Provider    Answer:   Hillard Danker A [[id number]]  . traMADol (ULTRAM) 50 MG tablet     Sig: Take 1 tablet (50 mg total) by mouth every 6 (six) hours as needed.    Dispense:  120 tablet    Refill:  0    Order Specific Question:   Supervising Provider    Answer:   Hillard Danker A [[id number]]  . amLODipine (NORVASC) 10 MG tablet    Sig: Take 1 tablet (10 mg total) by mouth daily.    Dispense:  90 tablet    Refill:  1    Order Specific Question:   Supervising Provider    Answer:   Hillard Danker A [[id number]]  . losartan (COZAAR) 100 MG tablet    Sig: Take 1 tablet (100 mg total) by mouth daily.    Dispense:  90 tablet    Refill:  1    Order Specific Question:   Supervising Provider    Answer:   Hillard Danker A [[id number]]  . glucose blood (ACCU-CHEK AVIVA) test strip    Sig: Use as instructed    Dispense:  50 each    Refill:  2    Order Specific Question:   Supervising Provider    Answer:   Hillard Danker A [[id number]]  . Lancets (ACCU-CHEK SOFT TOUCH) lancets    Sig: Use as instructed    Dispense:  100 each    Refill:  12    Fill per previous substitute as needed and covered by insurance.    Order Specific Question:   Supervising Provider    Answer:   Hillard Danker A [[id number]]     Follow-up: Return in about 1 month (around 10/20/2016), or if symptoms worsen or fail to improve.   Jeanine Luz, FNP

## 2016-09-19 NOTE — Assessment & Plan Note (Signed)
Continued tobacco use with approximately one half pack per day. She is in the precontemplation stage of change. Discussed importance of tobacco cessation for reduction of risk of chronic disease in the future. Continue to monitor.

## 2016-09-19 NOTE — Assessment & Plan Note (Signed)
1) Anticipatory Guidance: Discussed importance of wearing a seatbelt while driving and not texting while driving; changing batteries in smoke detector at least once annually; wearing suntan lotion when outside; eating a balanced and moderate diet; getting physical activity at least 30 minutes per day.  2) Immunizations / Screenings / Labs:  Declines tetanus, influenza, and Pneumovax. All other immunizations are up-to-date per recommendations. Declines hepatitis C screening. Colon cancer and cervical cancer screenings are up-to-date per recommendations. Breast cancer screening. Mammogram due in June encouraged to be completed independently. Diabetic foot exam completed today. Due for a dental exam encouraged to be completed independently. Obtain vitamin D for vitamin D deficiency screening. All other screenings are up-to-date per recommendations. Obtain CBC, CMET, and lipid profile.    Overall well exam with risk factors for cardiovascular disease including type 2 diabetes, tobacco use, obesity, and hypertension. Recommend weight loss of 5-10% of current body weight through nutrition and physical activity changes. Encouraged tobacco cessation. Recommend increasing physical activity to 30 minutes of moderate level activity daily. Continue other healthy lifestyle behaviors and choices. Follow-up prevention exam in 1 year. Follow-up office visit pending blood work and for chronic conditions.

## 2016-09-19 NOTE — Patient Instructions (Signed)
Thank you for choosing [facility].  SUMMARY AND INSTRUCTIONS:  Recommend working on weight loss to optimize health.  Continue with physical activity with goal of 30 minutes of moderate level activity daily.   Work on tobacco cessation.   Further follow up pending blood work.  Medication:  Your prescription(s) have been submitted to your pharmacy or been printed and provided for you. Please take as directed and contact our office if you believe you are having problem(s) with the medication(s) or have any questions.  Labs:  Please stop by the lab on the lower level of the building for your blood work. Your results will be released to [facility] (or called to you) after review, usually within 72 hours after test completion. If any changes need to be made, you will be notified at that same time.  1.) The lab is open from 7:30am to 5:30 pm Monday-Friday 2.) No appointment is necessary 3.) Fasting (if needed) is 6-8 hours after food and drink; black coffee and water are okay   Follow up:  If your symptoms worsen or fail to improve, please contact our office for further instruction, or in case of emergency go directly to the emergency room at the closest medical facility.   Health Maintenance, Female Adopting a healthy lifestyle and getting preventive care can go a long way to promote health and wellness. Talk with your health care provider about what schedule of regular examinations is right for you. This is a good chance for you to check in with your provider about disease prevention and staying healthy. In between checkups, there are plenty of things you can do on your own. Experts have done a lot of research about which lifestyle changes and preventive measures are most likely to keep you healthy. Ask your health care provider for more information. Weight and diet Eat a healthy diet  Be sure to include plenty of vegetables, fruits, low-fat dairy products, and lean protein.  Do  not eat a lot of foods high in solid fats, added sugars, or salt.  Get regular exercise. This is one of the most important things you can do for your health.  Most adults should exercise for at least 150 minutes each week. The exercise should increase your heart rate and make you sweat (moderate-intensity exercise).  Most adults should also do strengthening exercises at least twice a week. This is in addition to the moderate-intensity exercise. Maintain a healthy weight  Body mass index (BMI) is a measurement that can be used to identify possible weight problems. It estimates body fat based on height and weight. Your health care provider can help determine your BMI and help you achieve or maintain a healthy weight.  For females 98 years of age and older:  A BMI below 18.5 is considered underweight.  A BMI of 18.5 to 24.9 is normal.  A BMI of 25 to 29.9 is considered overweight.  A BMI of 30 and above is considered obese. Watch levels of cholesterol and blood lipids  You should start having your blood tested for lipids and cholesterol at 56 years of age, then have this test every 5 years.  You may need to have your cholesterol levels checked more often if:  Your lipid or cholesterol levels are high.  You are older than 56 years of age.  You are at high risk for heart disease. Cancer screening Lung Cancer  Lung cancer screening is recommended for adults 50-76 years old who are at high risk  for lung cancer because of a history of smoking.  A yearly low-dose CT scan of the lungs is recommended for people who:  Currently smoke.  Have quit within the past 15 years.  Have at least a 30-pack-year history of smoking. A pack year is smoking an average of one pack of cigarettes a day for 1 year.  Yearly screening should continue until it has been 15 years since you quit.  Yearly screening should stop if you develop a health problem that would prevent you from having lung cancer  treatment. Breast Cancer  Practice breast self-awareness. This means understanding how your breasts normally appear and feel.  It also means doing regular breast self-exams. Let your health care provider know about any changes, no matter how small.  If you are in your 20s or 30s, you should have a clinical breast exam (CBE) by a health care provider every 1-3 years as part of a regular health exam.  If you are 66 or older, have a CBE every year. Also consider having a breast X-ray (mammogram) every year.  If you have a family history of breast cancer, talk to your health care provider about genetic screening.  If you are at high risk for breast cancer, talk to your health care provider about having an MRI and a mammogram every year.  Breast cancer gene (BRCA) assessment is recommended for women who have family members with BRCA-related cancers. BRCA-related cancers include:  Breast.  Ovarian.  Tubal.  Peritoneal cancers.  Results of the assessment will determine the need for genetic counseling and BRCA1 and BRCA2 testing. Cervical Cancer  Your health care provider may recommend that you be screened regularly for cancer of the pelvic organs (ovaries, uterus, and vagina). This screening involves a pelvic examination, including checking for microscopic changes to the surface of your cervix (Pap test). You may be encouraged to have this screening done every 3 years, beginning at age 30.  For women ages 4-65, health care providers may recommend pelvic exams and Pap testing every 3 years, or they may recommend the Pap and pelvic exam, combined with testing for human papilloma virus (HPV), every 5 years. Some types of HPV increase your risk of cervical cancer. Testing for HPV may also be done on women of any age with unclear Pap test results.  Other health care providers may not recommend any screening for nonpregnant women who are considered low risk for pelvic cancer and who do not have  symptoms. Ask your health care provider if a screening pelvic exam is right for you.  If you have had past treatment for cervical cancer or a condition that could lead to cancer, you need Pap tests and screening for cancer for at least 20 years after your treatment. If Pap tests have been discontinued, your risk factors (such as having a new sexual partner) need to be reassessed to determine if screening should resume. Some women have medical problems that increase the chance of getting cervical cancer. In these cases, your health care provider may recommend more frequent screening and Pap tests. Colorectal Cancer  This type of cancer can be detected and often prevented.  Routine colorectal cancer screening usually begins at 56 years of age and continues through 56 years of age.  Your health care provider may recommend screening at an earlier age if you have risk factors for colon cancer.  Your health care provider may also recommend using home test kits to check for hidden blood in the  stool.  A small camera at the end of a tube can be used to examine your colon directly (sigmoidoscopy or colonoscopy). This is done to check for the earliest forms of colorectal cancer.  Routine screening usually begins at age 45.  Direct examination of the colon should be repeated every 5-10 years through 56 years of age. However, you may need to be screened more often if early forms of precancerous polyps or small growths are found. Skin Cancer  Check your skin from head to toe regularly.  Tell your health care provider about any new moles or changes in moles, especially if there is a change in a mole's shape or color.  Also tell your health care provider if you have a mole that is larger than the size of a pencil eraser.  Always use sunscreen. Apply sunscreen liberally and repeatedly throughout the day.  Protect yourself by wearing long sleeves, pants, a wide-brimmed hat, and sunglasses whenever you are  outside. Heart disease, diabetes, and high blood pressure  High blood pressure causes heart disease and increases the risk of stroke. High blood pressure is more likely to develop in:  People who have blood pressure in the high end of the normal range (130-139/85-89 mm Hg).  People who are overweight or obese.  People who are African American.  If you are 10-22 years of age, have your blood pressure checked every 3-5 years. If you are 39 years of age or older, have your blood pressure checked every year. You should have your blood pressure measured twice-once when you are at a hospital or clinic, and once when you are not at a hospital or clinic. Record the average of the two measurements. To check your blood pressure when you are not at a hospital or clinic, you can use:  An automated blood pressure machine at a pharmacy.  A home blood pressure monitor.  If you are between 105 years and 50 years old, ask your health care provider if you should take aspirin to prevent strokes.  Have regular diabetes screenings. This involves taking a blood sample to check your fasting blood sugar level.  If you are at a normal weight and have a low risk for diabetes, have this test once every three years after 56 years of age.  If you are overweight and have a high risk for diabetes, consider being tested at a younger age or more often. Preventing infection Hepatitis B  If you have a higher risk for hepatitis B, you should be screened for this virus. You are considered at high risk for hepatitis B if:  You were born in a country where hepatitis B is common. Ask your health care provider which countries are considered high risk.  Your parents were born in a high-risk country, and you have not been immunized against hepatitis B (hepatitis B vaccine).  You have HIV or AIDS.  You use needles to inject street drugs.  You live with someone who has hepatitis B.  You have had sex with someone who has  hepatitis B.  You get hemodialysis treatment.  You take certain medicines for conditions, including cancer, organ transplantation, and autoimmune conditions. Hepatitis C  Blood testing is recommended for:  Everyone born from [birth year] through 1965.  Anyone with known risk factors for hepatitis C. Sexually transmitted infections (STIs)  You should be screened for sexually transmitted infections (STIs) including gonorrhea and chlamydia if:  You are sexually active and are younger than 56 years of  age.  Dennis Bast are older than 56 years of age and your health care provider tells you that you are at risk for this type of infection.  Your sexual activity has changed since you were last screened and you are at an increased risk for chlamydia or gonorrhea. Ask your health care provider if you are at risk.  If you do not have HIV, but are at risk, it may be recommended that you take a prescription medicine daily to prevent HIV infection. This is called pre-exposure prophylaxis (PrEP). You are considered at risk if:  You are sexually active and do not regularly use condoms or know the HIV status of your partner(s).  You take drugs by injection.  You are sexually active with a partner who has HIV. Talk with your health care provider about whether you are at high risk of being infected with HIV. If you choose to begin PrEP, you should first be tested for HIV. You should then be tested every 3 months for as long as you are taking PrEP. Pregnancy  If you are premenopausal and you may become pregnant, ask your health care provider about preconception counseling.  If you may become pregnant, take 400 to 800 micrograms (mcg) of folic acid every day.  If you want to prevent pregnancy, talk to your health care provider about birth control (contraception). Osteoporosis and menopause  Osteoporosis is a disease in which the bones lose minerals and strength with aging. This can result in serious bone  fractures. Your risk for osteoporosis can be identified using a bone density scan.  If you are 2 years of age or older, or if you are at risk for osteoporosis and fractures, ask your health care provider if you should be screened.  Ask your health care provider whether you should take a calcium or vitamin D supplement to lower your risk for osteoporosis.  Menopause may have certain physical symptoms and risks.  Hormone replacement therapy may reduce some of these symptoms and risks. Talk to your health care provider about whether hormone replacement therapy is right for you. Follow these instructions at home:  Schedule regular health, dental, and eye exams.  Stay current with your immunizations.  Do not use any tobacco products including cigarettes, chewing tobacco, or electronic cigarettes.  If you are pregnant, do not drink alcohol.  If you are breastfeeding, limit how much and how often you drink alcohol.  Limit alcohol intake to no more than 1 drink per day for nonpregnant women. One drink equals 12 ounces of beer, 5 ounces of wine, or 1 ounces of hard liquor.  Do not use street drugs.  Do not share needles.  Ask your health care provider for help if you need support or information about quitting drugs.  Tell your health care provider if you often feel depressed.  Tell your health care provider if you have ever been abused or do not feel safe at home. This information is not intended to replace advice given to you by your health care provider. Make sure you discuss any questions you have with your health care provider. Document Released: 01/06/2011 Document Revised: 11/29/2015 Document Reviewed: 03/27/2015 Elsevier Interactive Patient Education  2017 Elsevier Inc.   Smoking Hazards Smoking cigarettes is extremely bad for your health. Tobacco smoke has over 200 known poisons in it. It contains the poisonous gases nitrogen oxide and carbon monoxide. There are over 60  chemicals in tobacco smoke that cause cancer. Some of the chemicals found in  cigarette smoke include:   Cyanide.   Benzene.   Formaldehyde.   Methanol (wood alcohol).   Acetylene (fuel used in welding torches).   Ammonia.  Even smoking lightly shortens your life expectancy by several years. You can greatly reduce the risk of medical problems for you and your family by stopping now. Smoking is the most preventable cause of death and disease in our society. Within days of quitting smoking, your circulation improves, you decrease the risk of having a heart attack, and your lung capacity improves. There may be some increased phlegm in the first few days after quitting, and it may take months for your lungs to clear up completely. Quitting for 10 years reduces your risk of developing lung cancer to almost that of a nonsmoker.  WHAT ARE THE RISKS OF SMOKING? Cigarette smokers have an increased risk of many serious medical problems, including:  Lung cancer.   Lung disease (such as pneumonia, bronchitis, and emphysema).   Heart attack and chest pain due to the heart not getting enough oxygen (angina).   Heart disease and peripheral blood vessel disease.   Hypertension.   Stroke.   Oral cancer (cancer of the lip, mouth, or voice box).   Bladder cancer.   Pancreatic cancer.   Cervical cancer.   Pregnancy complications, including premature birth.   Stillbirths and smaller newborn babies, birth defects, and genetic damage to sperm.   Early menopause.   Lower estrogen level for women.   Infertility.   Facial wrinkles.   Blindness.   Increased risk of broken bones (fractures).   Senile dementia.   Stomach ulcers and internal bleeding.   Delayed wound healing and increased risk of complications during surgery. Because of secondhand smoke exposure, children of smokers have an increased risk of the following:   Sudden infant death syndrome (SIDS).    Respiratory infections.   Lung cancer.   Heart disease.   Ear infections.  WHY IS SMOKING ADDICTIVE? Nicotine is the chemical agent in tobacco that is capable of causing addiction or dependence. When you smoke and inhale, nicotine is absorbed rapidly into the bloodstream through your lungs. Both inhaled and noninhaled nicotine may be addictive.  WHAT ARE THE BENEFITS OF QUITTING?  There are many health benefits to quitting smoking. Some are:   The likelihood of developing cancer and heart disease decreases. Health improvements are seen almost immediately.   Blood pressure, pulse rate, and breathing patterns start returning to normal soon after quitting.   People who quit may see an improvement in their overall quality of life.  HOW DO YOU QUIT SMOKING? Smoking is an addiction with both physical and psychological effects, and longtime habits can be hard to change. Your health care provider can recommend:  Programs and community resources, which may include group support, education, or therapy.  Replacement products, such as patches, gum, and nasal sprays. Use these products only as directed. Do not replace cigarette smoking with electronic cigarettes (commonly called e-cigarettes). The safety of e-cigarettes is unknown, and some may contain harmful chemicals. FOR MORE INFORMATION  American Lung Association: www.lung.org  American Cancer Society: www.cancer.org   This information is not intended to replace advice given to you by your health care provider. Make sure you discuss any questions you have with your health care provider.   Document Released: 07/31/2004 Document Revised: 04/13/2013 Document Reviewed: 12/13/2012 Elsevier Interactive Patient Education 2016 [employer].  Steps to Quit Smoking  Smoking tobacco can be harmful to your health and  can affect almost every organ in your body. Smoking puts you, and those around you, at risk for developing many serious  chronic diseases. Quitting smoking is difficult, but it is one of the best things that you can do for your health. It is never too late to quit. WHAT ARE THE BENEFITS OF QUITTING SMOKING? When you quit smoking, you lower your risk of developing serious diseases and conditions, such as:  Lung cancer or lung disease, such as COPD.  Heart disease.  Stroke.  Heart attack.  Infertility.  Osteoporosis and bone fractures. Additionally, symptoms such as coughing, wheezing, and shortness of breath may get better when you quit. You may also find that you get sick less often because your body is stronger at fighting off colds and infections. If you are pregnant, quitting smoking can help to reduce your chances of having a baby of low birth weight. HOW DO I GET READY TO QUIT? When you decide to quit smoking, create a plan to make sure that you are successful. Before you quit:  Pick a date to quit. Set a date within the next two weeks to give you time to prepare.  Write down the reasons why you are quitting. Keep this list in places where you will see it often, such as on your bathroom mirror or in your car or wallet.  Identify the people, places, things, and activities that make you want to smoke (triggers) and avoid them. Make sure to take these actions:  Throw away all cigarettes at home, at work, and in your car.  Throw away smoking accessories, such as Scientist, research (medical).  Clean your car and make sure to empty the ashtray.  Clean your home, including curtains and carpets.  Tell your family, friends, and coworkers that you are quitting. Support from your loved ones can make quitting easier.  Talk with your health care provider about your options for quitting smoking.  Find out what treatment options are covered by your health insurance. WHAT STRATEGIES CAN I USE TO QUIT SMOKING?  Talk with your healthcare provider about different strategies to quit smoking. Some strategies  include:  Quitting smoking altogether instead of gradually lessening how much you smoke over a period of time. Research shows that quitting "cold [place]" is more successful than gradually quitting.  Attending in-person counseling to help you build problem-solving skills. You are more likely to have success in quitting if you attend several counseling sessions. Even short sessions of 10 minutes can be effective.  Finding resources and support systems that can help you to quit smoking and remain smoke-free after you quit. These resources are most helpful when you use them often. They can include:  Online chats with a Social worker.  Telephone quitlines.  Printed Furniture conservator/restorer.  Support groups or group counseling.  Text messaging programs.  Mobile phone applications.  Taking medicines to help you quit smoking. (If you are pregnant or breastfeeding, talk with your health care provider first.) Some medicines contain nicotine and some do not. Both types of medicines help with cravings, but the medicines that include nicotine help to relieve withdrawal symptoms. Your health care provider may recommend:  Nicotine patches, gum, or lozenges.  Nicotine inhalers or sprays.  Non-nicotine medicine that is taken by mouth. Talk with your health care provider about combining strategies, such as taking medicines while you are also receiving in-person counseling. Using these two strategies together makes you more likely to succeed in quitting than if you used either strategy  on its own. If you are pregnant or breastfeeding, talk with your health care provider about finding counseling or other support strategies to quit smoking. Do not take medicine to help you quit smoking unless told to do so by your health care provider. WHAT THINGS CAN I DO TO MAKE IT EASIER TO QUIT? Quitting smoking might feel overwhelming at first, but there is a lot that you can do to make it easier. Take these important  actions:  Reach out to your family and friends and ask that they support and encourage you during this time. Call telephone quitlines, reach out to support groups, or work with a counselor for support.  Ask people who smoke to avoid smoking around you.  Avoid places that trigger you to smoke, such as bars, parties, or smoke-break areas at work.  Spend time around people who do not smoke.  Lessen stress in your life, because stress can be a smoking trigger for some people. To lessen stress, try:  Exercising regularly.  Deep-breathing exercises.  Yoga.  Meditating.  Performing a body scan. This involves closing your eyes, scanning your body from head to toe, and noticing which parts of your body are particularly tense. Purposefully relax the muscles in those areas.  Download or purchase mobile phone or tablet apps (applications) that can help you stick to your quit plan by providing reminders, tips, and encouragement. There are many free apps, such as QuitGuide from the [employer] Office manager for Disease Control and Prevention). You can find other support for quitting smoking (smoking cessation) through smokefree.gov and other websites. HOW WILL I FEEL WHEN I QUIT SMOKING? Within the first 24 hours of quitting smoking, you may start to feel some withdrawal symptoms. These symptoms are usually most noticeable 2-3 days after quitting, but they usually do not last beyond 2-3 weeks. Changes or symptoms that you might experience include:  Mood swings.  Restlessness, anxiety, or irritation.  Difficulty concentrating.  Dizziness.  Strong cravings for sugary foods in addition to nicotine.  Mild weight gain.  Constipation.  Nausea.  Coughing or a sore throat.  Changes in how your medicines work in your body.  A depressed mood.  Difficulty sleeping (insomnia). After the first 2-3 weeks of quitting, you may start to notice more positive results, such as:  Improved sense of smell and  taste.  Decreased coughing and sore throat.  Slower heart rate.  Lower blood pressure.  Clearer skin.  The ability to breathe more easily.  Fewer sick days. Quitting smoking is very challenging for most people. Do not get discouraged if you are not successful the first time. Some people need to make many attempts to quit before they achieve long-term success. Do your best to stick to your quit plan, and talk with your health care provider if you have any questions or concerns.   This information is not intended to replace advice given to you by your health care provider. Make sure you discuss any questions you have with your health care provider.   Document Released: 06/17/2001 Document Revised: 11/07/2014 Document Reviewed: 11/07/2014 Elsevier Interactive Patient Education 2016 [employer].  Smoking Cessation, Tips for Success If you are ready to quit smoking, congratulations! You have chosen to help yourself be healthier. Cigarettes bring nicotine, tar, carbon monoxide, and other irritants into your body. Your lungs, heart, and blood vessels will be able to work better without these poisons. There are many different ways to quit smoking. Nicotine gum, nicotine patches, a nicotine inhaler,  or nicotine nasal spray can help with physical craving. Hypnosis, support groups, and medicines help break the habit of smoking. WHAT THINGS CAN I DO TO MAKE QUITTING EASIER?  Here are some tips to help you quit for good:  Pick a date when you will quit smoking completely. Tell all of your friends and family about your plan to quit on that date.  Do not try to slowly cut down on the number of cigarettes you are smoking. Pick a quit date and quit smoking completely starting on that day.  Throw away all cigarettes.   Clean and remove all ashtrays from your home, work, and car.  On a card, write down your reasons for quitting. Carry the card with you and read it when you get the urge to  smoke.  Cleanse your body of nicotine. Drink enough water and fluids to keep your urine clear or pale yellow. Do this after quitting to flush the nicotine from your body.  Learn to predict your moods. Do not let a bad situation be your excuse to have a cigarette. Some situations in your life might tempt you into wanting a cigarette.  Never have "just one" cigarette. It leads to wanting another and another. Remind yourself of your decision to quit.  Change habits associated with smoking. If you smoked while driving or when feeling stressed, try other activities to replace smoking. Stand up when drinking your coffee. Brush your teeth after eating. Sit in a different chair when you read the paper. Avoid alcohol while trying to quit, and try to drink fewer caffeinated beverages. Alcohol and caffeine may urge you to smoke.  Avoid foods and drinks that can trigger a desire to smoke, such as sugary or spicy foods and alcohol.  Ask people who smoke not to smoke around you.  Have something planned to do right after eating or having a cup of coffee. For example, plan to take a walk or exercise.  Try a relaxation exercise to calm you down and decrease your stress. Remember, you may be tense and nervous for the first 2 weeks after you quit, but this will pass.  Find new activities to keep your hands busy. Play with a pen, coin, or rubber band. Doodle or draw things on paper.  Brush your teeth right after eating. This will help cut down on the craving for the taste of tobacco after meals. You can also try mouthwash.   Use oral substitutes in place of cigarettes. Try using lemon drops, carrots, cinnamon sticks, or chewing gum. Keep them handy so they are available when you have the urge to smoke.  When you have the urge to smoke, try deep breathing.  Designate your home as a nonsmoking area.  If you are a heavy smoker, ask your health care provider about a prescription for nicotine chewing gum. It can  ease your withdrawal from nicotine.  Reward yourself. Set aside the cigarette money you save and buy yourself something nice.  Look for support from others. Join a support group or smoking cessation program. Ask someone at home or at work to help you with your plan to quit smoking.  Always ask yourself, "Do I need this cigarette or is this just a reflex?" Tell yourself, "Today, I choose not to smoke," or "I do not want to smoke." You are reminding yourself of your decision to quit.  Do not replace cigarette smoking with electronic cigarettes (commonly called e-cigarettes). The safety of e-cigarettes is unknown, and some  may contain harmful chemicals.  If you relapse, do not give up! Plan ahead and think about what you will do the next time you get the urge to smoke. HOW WILL I FEEL WHEN I QUIT SMOKING? You may have symptoms of withdrawal because your body is used to nicotine (the addictive substance in cigarettes). You may crave cigarettes, be irritable, feel very hungry, cough often, get headaches, or have difficulty concentrating. The withdrawal symptoms are only temporary. They are strongest when you first quit but will go away within 10-14 days. When withdrawal symptoms occur, stay in control. Think about your reasons for quitting. Remind yourself that these are signs that your body is healing and getting used to being without cigarettes. Remember that withdrawal symptoms are easier to treat than the major diseases that smoking can cause.  Even after the withdrawal is over, expect periodic urges to smoke. However, these cravings are generally short lived and will go away whether you smoke or not. Do not smoke! WHAT RESOURCES ARE AVAILABLE TO HELP ME QUIT SMOKING? Your health care provider can direct you to community resources or hospitals for support, which may include:  Group support.  Education.  Hypnosis.  Therapy.   This information is not intended to replace advice given to you by  your health care provider. Make sure you discuss any questions you have with your health care provider.   Document Released: 03/21/2004 Document Revised: 07/14/2014 Document Reviewed: 12/09/2012 Elsevier Interactive Patient Education [employer].

## 2016-09-19 NOTE — Assessment & Plan Note (Signed)
Obtain hemoglobin A1c and urine microalbumin. Maintained on losartan for CAD risk reduction. Diabetic foot exam completed today. Diabetic eye exam is up-to-date. Continue current dosage of metformin pending A1c results. Encouraged to monitor blood sugars at home 1-2 times per day. Follow-up pending A1c results.

## 2016-09-25 ENCOUNTER — Ambulatory Visit: Admitting: Cardiovascular Disease

## 2016-10-09 NOTE — Progress Notes (Signed)
Patient ID: Stephanie Joseph, female   DOB: [birth year], 56 y.o.   MRN: [id number]   56 y.o.  referred for murmur and abnormal echo 06/2014 . Reviewed echo read by Dr Lynnea Ferrier in September 2013 . He indicated severe MR with no frank prolapse or flail. LV size and funciton normal. 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 Denies appetite suppresant use, RF, SBE. 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  I reviewed the echo and thought the MR was more moderate and in the absence of symptoms or clearly repairable lesion there is no indicatoin for agressive w/u at this time Discussed this with patient including diagnosis of MR and warning signs of symptomatic progression. BP suboptimally controlled and cozaar added with improvement .   Echo 07/27/15 personally reviewed EF 50-55% Moderate AR Moderate MR Mild LAE   BP up as she ran out of meds 2 weeks ago. Waiting to get them from [place] Told her we would be happy to refill   Husband of 12 years acting up left to [place] and did crack and drank white alcohol Has DM and been sick since he returned   ROS: Denies fever, malais, weight loss, blurry vision, decreased visual acuity, cough, sputum, SOB, hemoptysis, pleuritic pain, palpitaitons, heartburn, abdominal pain, melena, lower extremity edema, claudication, or rash.  All other systems reviewed and negative  General: Affect appropriate Overweight black female  HEENT: normal Neck supple with no adenopathy JVP normal no bruits no thyromegaly Lungs clear with no wheezing and good diaphragmatic motion Heart:  S1/S2 mild AS/AR murmur, no rub, gallop or click PMI normal Abdomen: benighn, BS positve, no tenderness, no AAA no bruit.  No HSM or HJR Distal pulses intact with no bruits No edema Neuro non-focal Skin warm and dry No muscular weakness   Current Outpatient Prescriptions  Medication Sig Dispense Refill  . amLODipine (NORVASC)  10 MG tablet Take 1 tablet (10 mg total) by mouth daily. 30 tablet 0  . glucose blood (ACCU-CHEK AVIVA) test strip Use as instructed 50 each 2  . hydrochlorothiazide 25 MG tablet Take 25 mg by mouth daily.      . hydrOXYzine (ATARAX/VISTARIL) 50 MG tablet Take 50 mg by mouth 3 (three) times daily as needed for itching.    . Lancets (ACCU-CHEK SOFT TOUCH) lancets Use as instructed 100 each 12  . losartan (COZAAR) 100 MG tablet Take 1 tablet (100 mg total) by mouth daily. 30 tablet 0  . medroxyPROGESTERone (PROVERA) 5 MG tablet Take 1 tablet (5 mg total) by mouth daily. 30 tablet 12  . metFORMIN (GLUCOPHAGE) 500 MG tablet Take 500 mg by mouth daily.     . methocarbamol (ROBAXIN) 500 MG tablet TAKE TWO TABLETS BY MOUTH THREE TIMES DAILY AS NEEDED FOR MUSCLE SPASM 90 tablet 2  . oxybutynin (DITROPAN-XL) 5 MG 24 hr tablet Take 1 tablet (5 mg total) by mouth at bedtime. 30 tablet 6  . QUEtiapine (SEROQUEL) 50 MG tablet Take 50 mg by mouth at bedtime.    . timolol (BETIMOL) 0.25 % ophthalmic solution 1-2 drops 2 (two) times daily.    . traMADol (ULTRAM) 50 MG tablet Take 1 tablet (50 mg total) by mouth every 6 (six) hours as needed. 120 tablet 0  . venlafaxine (EFFEXOR) 75 MG tablet Take 75 mg by mouth 3 (three) times daily with meals.     No current facility-administered medications for this visit.  Allergies  Patient has no known allergies.  Electrocardiogram:  5/15  SR rate 67 normal  07/27/15  SR rate 73  Normal   Assessment and Plan  MR /AR no change in murmur f/u echo no need for SBE  Bipolar: stable continue current meds [id number] low carb diet.  Target hemoglobin A1c is 6.5 or less.  Continue current medications. HTN: Well controlled.  Continue current medications and low sodium Dash type diet.    F/u with me in 6 months    Charlton Haws

## 2016-10-10 ENCOUNTER — Encounter: Payer: Self-pay | Admitting: Cardiovascular Disease

## 2016-10-10 ENCOUNTER — Ambulatory Visit (INDEPENDENT_AMBULATORY_CARE_PROVIDER_SITE_OTHER): Admitting: Cardiovascular Disease

## 2016-10-10 VITALS — BP 170/80 | HR 73 | Ht 67.0 in | Wt 212.0 lb

## 2016-10-10 DIAGNOSIS — I340 Nonrheumatic mitral (valve) insufficiency: Secondary | ICD-10-CM | POA: Diagnosis not present

## 2016-10-10 DIAGNOSIS — I351 Nonrheumatic aortic (valve) insufficiency: Secondary | ICD-10-CM

## 2016-10-10 DIAGNOSIS — I10 Essential (primary) hypertension: Secondary | ICD-10-CM

## 2016-10-10 MED ORDER — LOSARTAN POTASSIUM 100 MG PO TABS: 100.0000 mg | ORAL_TABLET | Freq: Every day | ORAL | 0 refills | Status: DC

## 2016-10-10 MED ORDER — AMLODIPINE BESYLATE 10 MG PO TABS: 10.0000 mg | ORAL_TABLET | Freq: Every day | ORAL | 0 refills | Status: DC

## 2016-10-10 NOTE — Patient Instructions (Addendum)

## 2016-12-05 ENCOUNTER — Encounter (HOSPITAL_COMMUNITY): Payer: Self-pay

## 2016-12-05 ENCOUNTER — Ambulatory Visit (HOSPITAL_COMMUNITY)
Admission: EM | Admit: 2016-12-05 | Discharge: 2016-12-05 | Disposition: A | Discharge status: Home / Self Care | Attending: Internal Medicine | Admitting: Internal Medicine

## 2016-12-05 DIAGNOSIS — R030 Elevated blood-pressure reading, without diagnosis of hypertension: Secondary | ICD-10-CM | POA: Diagnosis not present

## 2016-12-05 DIAGNOSIS — Z8679 Personal history of other diseases of the circulatory system: Secondary | ICD-10-CM

## 2016-12-05 DIAGNOSIS — J0100 Acute maxillary sinusitis, unspecified: Secondary | ICD-10-CM | POA: Diagnosis not present

## 2016-12-05 DIAGNOSIS — F329 Major depressive disorder, single episode, unspecified: Secondary | ICD-10-CM | POA: Diagnosis not present

## 2016-12-05 DIAGNOSIS — Z8249 Family history of ischemic heart disease and other diseases of the circulatory system: Secondary | ICD-10-CM | POA: Insufficient documentation

## 2016-12-05 DIAGNOSIS — F17210 Nicotine dependence, cigarettes, uncomplicated: Secondary | ICD-10-CM | POA: Diagnosis not present

## 2016-12-05 DIAGNOSIS — Z79899 Other long term (current) drug therapy: Secondary | ICD-10-CM | POA: Insufficient documentation

## 2016-12-05 DIAGNOSIS — I10 Essential (primary) hypertension: Secondary | ICD-10-CM | POA: Diagnosis not present

## 2016-12-05 DIAGNOSIS — E119 Type 2 diabetes mellitus without complications: Secondary | ICD-10-CM | POA: Diagnosis not present

## 2016-12-05 DIAGNOSIS — Z7984 Long term (current) use of oral hypoglycemic drugs: Secondary | ICD-10-CM | POA: Insufficient documentation

## 2016-12-05 DIAGNOSIS — J029 Acute pharyngitis, unspecified: Secondary | ICD-10-CM | POA: Diagnosis not present

## 2016-12-05 LAB — POCT RAPID STREP A: Streptococcus, Group A Screen (Direct): NEGATIVE

## 2016-12-05 MED ORDER — AMOXICILLIN-POT CLAVULANATE 875-125 MG PO TABS: 1.0000 | ORAL_TABLET | Freq: Two times a day (BID) | ORAL | 0 refills | Status: DC

## 2016-12-05 NOTE — ED Triage Notes (Signed)
Pt has been sick since Sunday. Having sore throat, headache, body aches, and said possible blisters on her tongue. Has been taking mucinex and dayquil. Nothing has helped.

## 2016-12-05 NOTE — Discharge Instructions (Addendum)
Please take antibiotic as directed, rest, push fluids. Follow up with PCP for general medical issues/recheck BP.

## 2016-12-05 NOTE — ED Provider Notes (Signed)
CSN: [id number]     Arrival date & time 12/05/16  4098 History   None    Chief Complaint  Patient presents with  . Sore Throat   (Consider location/radiation/quality/duration/timing/severity/associated sxs/prior Treatment) The history is provided by the patient. No language interpreter was used.  Sore Throat  This is a new problem. The current episode started more than 2 days ago (5 days ago). The problem occurs constantly. The problem has been gradually worsening. Associated symptoms include headaches. Pertinent negatives include no chest pain, no abdominal pain and no shortness of breath. Exacerbated by: positional, leaning forward. Nothing relieves the symptoms. Treatments tried: OTC meds(Dayquil, Nyquil, ibuprofen, throat lozenges)    Past Medical History:  Diagnosis Date  . Anemia, iron deficiency   . Arthritis   . Depression   . Diabetes mellitus without complication (HCC)   . Dysmenorrhea   . Gout   . Heart valve problem   . Hypertension   . Menorrhagia   . Mitral regurgitation   . Pneumonia    Past Surgical History:  Procedure Laterality Date  . KNEE ARTHROSCOPY     Left   Family History  Problem Relation Age of Onset  . Hypertension Mother   . Stroke Paternal Grandmother   . Heart disease Sister   . Heart disease Brother    Social History  Substance Use Topics  . Smoking status: Current Every Day Smoker    Packs/day: 0.50    Years: 7.00    Types: Cigarettes  . Smokeless tobacco: Never Used  . Alcohol use No   OB History    Gravida Para Term Preterm AB Living   3 1   1 2 1    SAB TAB Ectopic Multiple Live Births   1 1           Review of Systems  Constitutional: Negative.   HENT: Positive for congestion, sinus pain, sinus pressure and sore throat.   Eyes: Negative.   Respiratory: Negative for shortness of breath.   Cardiovascular: Negative for chest pain.  Gastrointestinal: Negative for abdominal pain.  Endocrine: Negative.   Genitourinary:  Negative.   Musculoskeletal: Negative.   Skin: Negative.   Neurological: Positive for headaches.  Hematological: Negative.   Psychiatric/Behavioral: Negative.   All other systems reviewed and are negative.   Allergies  Patient has no known allergies.  Home Medications   Prior to Admission medications   Medication Sig Start Date End Date Taking? Authorizing Provider  amLODipine (NORVASC) 10 MG tablet Take 1 tablet (10 mg total) by mouth daily. 10/10/16  Yes Wendall Stade, MD  glucose blood (ACCU-CHEK AVIVA) test strip Use as instructed 09/19/16  Yes Veryl Speak, FNP  hydrochlorothiazide 25 MG tablet Take 25 mg by mouth daily.     Yes [provider]  hydrOXYzine (ATARAX/VISTARIL) 50 MG tablet Take 50 mg by mouth 3 (three) times daily as needed for itching.   Yes [provider]  Lancets (ACCU-CHEK SOFT TOUCH) lancets Use as instructed 09/19/16  Yes Veryl Speak, FNP  losartan (COZAAR) 100 MG tablet Take 1 tablet (100 mg total) by mouth daily. 10/10/16  Yes Wendall Stade, MD  medroxyPROGESTERone (PROVERA) 5 MG tablet Take 1 tablet (5 mg total) by mouth daily. 02/14/16  Yes Adam Phenix, MD  metFORMIN (GLUCOPHAGE) 500 MG tablet Take 500 mg by mouth daily.    Yes [provider]  methocarbamol (ROBAXIN) 500 MG tablet TAKE TWO TABLETS BY MOUTH THREE TIMES DAILY  AS NEEDED FOR MUSCLE SPASM 09/19/16  Yes Veryl Speakalone, Gregory D, FNP  oxybutynin (DITROPAN-XL) 5 MG 24 hr tablet Take 1 tablet (5 mg total) by mouth at bedtime. 02/14/16  Yes Adam PhenixArnold, James G, MD  QUEtiapine (SEROQUEL) 50 MG tablet Take 50 mg by mouth at bedtime.   Yes [provider]  timolol (BETIMOL) 0.25 % ophthalmic solution 1-2 drops 2 (two) times daily.   Yes [provider]  traMADol (ULTRAM) 50 MG tablet Take 1 tablet (50 mg total) by mouth every 6 (six) hours as needed. 09/19/16  Yes Veryl Speakalone, Gregory D, FNP  venlafaxine (EFFEXOR) 75 MG tablet Take 75 mg by mouth 3 (three) times  daily with meals.   Yes [provider]  amoxicillin-clavulanate (AUGMENTIN) 875-125 MG tablet Take 1 tablet by mouth every 12 (twelve) hours. 12/05/16   Cullen Lahaie, Para MarchJeanette, NP   Meds Ordered and Administered this Visit  Medications - No data to display  BP (!) 141/84 (BP Location: Right Arm) Comment: notified rn  Pulse 96   Temp 99 F (37.2 C) (Oral)   Resp 14   LMP 02/13/2014 (Within Days)   SpO2 97%  No data found.   Physical Exam  Constitutional: She is oriented to person, place, and time. She appears well-developed and well-nourished. She is active and cooperative. No distress.  HENT:  Head: Normocephalic.  Right Ear: Tympanic membrane is retracted.  Left Ear: Tympanic membrane is retracted.  Nose: Mucosal edema and sinus tenderness present. Right sinus exhibits maxillary sinus tenderness. Left sinus exhibits maxillary sinus tenderness.  Mouth/Throat: Uvula is midline and oropharynx is clear and moist.  +yellow PND  Eyes: Conjunctivae, EOM and lids are normal. Pupils are equal, round, and reactive to light.  Neck: Trachea normal and normal range of motion. No tracheal deviation present.  Cardiovascular: Regular rhythm, normal heart sounds and normal pulses.   No murmur heard. Pulmonary/Chest: Effort normal and breath sounds normal.  Abdominal: Normal appearance. There is no tenderness.  Musculoskeletal: Normal range of motion.  Lymphadenopathy:    She has no cervical adenopathy.  Neurological: She is alert and oriented to person, place, and time. GCS eye subscore is 4. GCS verbal subscore is 5. GCS motor subscore is 6.  Skin: Skin is warm and dry. No rash noted.  Psychiatric: She has a normal mood and affect. Her speech is normal and behavior is normal.  Nursing note and vitals reviewed.   Urgent Care Course     Procedures (including critical care time)  Labs Review Labs Reviewed  POCT RAPID STREP A    Imaging Review No results found.      MDM    1. Acute non-recurrent maxillary sinusitis   2. Elevated blood pressure reading   3. Hx of essential hypertension    Strep negative. Please take antibiotic as directed, rest, push fluids. Follow up with PCP for general medical issues/recheck BP. Pt verbalized understanding to this provider.    Clancy Gourdefelice, Keyly Baldonado, NP 12/05/16 1728

## 2016-12-08 ENCOUNTER — Encounter: Payer: Self-pay | Admitting: Nurse Practitioner

## 2016-12-08 ENCOUNTER — Ambulatory Visit (INDEPENDENT_AMBULATORY_CARE_PROVIDER_SITE_OTHER): Admitting: Nurse Practitioner

## 2016-12-08 VITALS — BP 150/88 | HR 88 | Temp 98.9°F | Ht 67.0 in | Wt 211.0 lb

## 2016-12-08 DIAGNOSIS — H1032 Unspecified acute conjunctivitis, left eye: Secondary | ICD-10-CM | POA: Diagnosis not present

## 2016-12-08 DIAGNOSIS — J0140 Acute pansinusitis, unspecified: Secondary | ICD-10-CM | POA: Diagnosis not present

## 2016-12-08 DIAGNOSIS — I10 Essential (primary) hypertension: Secondary | ICD-10-CM | POA: Diagnosis not present

## 2016-12-08 LAB — CULTURE, GROUP A STREP (THRC)

## 2016-12-08 MED ORDER — METHYLPREDNISOLONE ACETATE 40 MG/ML IJ SUSP
40.0000 mg | Freq: Once | INTRAMUSCULAR | Status: AC
  Administered 2016-12-08: 40 mg via INTRAMUSCULAR

## 2016-12-08 MED ORDER — FLUTICASONE PROPIONATE 50 MCG/ACT NA SUSP: 2.0000 | Freq: Every day | NASAL | 0 refills | Status: DC

## 2016-12-08 MED ORDER — NEOMYCIN-POLYMYXIN-HC 3.5-10000-1 OP SUSP: 4.0000 [drp] | Freq: Three times a day (TID) | OPHTHALMIC | 0 refills | Status: DC

## 2016-12-08 MED ORDER — OXYMETAZOLINE HCL 0.05 % NA SOLN: 1.0000 | Freq: Two times a day (BID) | NASAL | 0 refills | Status: DC

## 2016-12-08 MED ORDER — DM-GUAIFENESIN ER 30-600 MG PO TB12: 1.0000 | ORAL_TABLET | Freq: Two times a day (BID) | ORAL | 0 refills | Status: DC | PRN

## 2016-12-08 NOTE — Progress Notes (Signed)
Subjective:  Patient ID: Stephanie Joseph, female    DOB: 20-Aug-1960  Age: 56 y.o. MRN: [id number]  CC: Sinusitis (bumps on tongue/ear pain/high BP/red eyes---still taking abx from urgent care)   Sinusitis  This is a new problem. The current episode started in the past 7 days. The problem is unchanged. There has been no fever. Associated symptoms include congestion, coughing, headaches, a hoarse voice, shortness of breath, sinus pressure and a sore throat. Pertinent negatives include no chills, diaphoresis, ear pain or neck pain. Past treatments include oral decongestants.    Outpatient Medications Prior to Visit  Medication Sig Dispense Refill  . amLODipine (NORVASC) 10 MG tablet Take 1 tablet (10 mg total) by mouth daily. 30 tablet 0  . amoxicillin-clavulanate (AUGMENTIN) 875-125 MG tablet Take 1 tablet by mouth every 12 (twelve) hours. 20 tablet 0  . glucose blood (ACCU-CHEK AVIVA) test strip Use as instructed 50 each 2  . hydrochlorothiazide 25 MG tablet Take 25 mg by mouth daily.      . hydrOXYzine (ATARAX/VISTARIL) 50 MG tablet Take 50 mg by mouth 3 (three) times daily as needed for itching.    . Lancets (ACCU-CHEK SOFT TOUCH) lancets Use as instructed 100 each 12  . losartan (COZAAR) 100 MG tablet Take 1 tablet (100 mg total) by mouth daily. 30 tablet 0  . medroxyPROGESTERone (PROVERA) 5 MG tablet Take 1 tablet (5 mg total) by mouth daily. 30 tablet 12  . metFORMIN (GLUCOPHAGE) 500 MG tablet Take 500 mg by mouth daily.     . methocarbamol (ROBAXIN) 500 MG tablet TAKE TWO TABLETS BY MOUTH THREE TIMES DAILY AS NEEDED FOR MUSCLE SPASM 90 tablet 2  . oxybutynin (DITROPAN-XL) 5 MG 24 hr tablet Take 1 tablet (5 mg total) by mouth at bedtime. 30 tablet 6  . QUEtiapine (SEROQUEL) 50 MG tablet Take 50 mg by mouth at bedtime.    . timolol (BETIMOL) 0.25 % ophthalmic solution 1-2 drops 2 (two) times daily.    . traMADol (ULTRAM) 50 MG tablet Take 1 tablet (50 mg total) by mouth every 6 (six)  hours as needed. 120 tablet 0  . venlafaxine (EFFEXOR) 75 MG tablet Take 75 mg by mouth 3 (three) times daily with meals.     No facility-administered medications prior to visit.     ROS See HPI  Objective:  BP (!) 150/88   Pulse 88   Temp 98.9 F (37.2 C)   Ht 5\' 7"  (1.702 m)   Wt 211 lb (95.7 kg)   LMP 02/13/2014 (Within Days)   SpO2 99%   BMI 33.05 kg/m   BP Readings from Last 3 Encounters:  12/08/16 (!) 150/88  12/05/16 (!) 141/84  10/10/16 (!) 170/80    Wt Readings from Last 3 Encounters:  12/08/16 211 lb (95.7 kg)  10/10/16 212 lb (96.2 kg)  09/19/16 215 lb (97.5 kg)    Physical Exam  Constitutional: She is oriented to person, place, and time.  HENT:  Right Ear: External ear and ear canal normal. No mastoid tenderness. Tympanic membrane is not erythematous. A middle ear effusion is present.  Left Ear: External ear and ear canal normal. No mastoid tenderness. Tympanic membrane is erythematous. A middle ear effusion is present.  Nose: Mucosal edema and rhinorrhea present. Right sinus exhibits maxillary sinus tenderness and frontal sinus tenderness. Left sinus exhibits maxillary sinus tenderness and frontal sinus tenderness.  Mouth/Throat: Uvula is midline. No trismus in the jaw. Posterior oropharyngeal erythema present. No oropharyngeal exudate.  Eyes: EOM are normal. Pupils are equal, round, and reactive to light. Right eye exhibits no chemosis, no discharge, no exudate and no hordeolum. No foreign body present in the right eye. Left eye exhibits discharge and exudate. Left eye exhibits no chemosis and no hordeolum. Left conjunctiva is injected. Left conjunctiva has no hemorrhage. No scleral icterus.  Neck: Normal range of motion. Neck supple.  Cardiovascular: Normal rate and normal heart sounds.   Pulmonary/Chest: Effort normal and breath sounds normal.  Musculoskeletal: She exhibits no edema.  Lymphadenopathy:    She has cervical adenopathy.  Neurological: She is  alert and oriented to person, place, and time.  Vitals reviewed.     No results found.  Assessment & Plan:   Stephanie Landngela was seen today for sinusitis.  Diagnoses and all orders for this visit:  Acute bacterial conjunctivitis of left eye -     methylPREDNISolone acetate (DEPO-MEDROL) injection 40 mg; Inject 1 mL (40 mg total) into the muscle once. -     fluticasone (FLONASE) 50 MCG/ACT nasal spray; Place 2 sprays into both nostrils daily. -     oxymetazoline (AFRIN NASAL SPRAY) 0.05 % nasal spray; Place 1 spray into both nostrils 2 (two) times daily. Use only for 3days, then stop -     dextromethorphan-guaiFENesin (MUCINEX DM) 30-600 MG 12hr tablet; Take 1 tablet by mouth 2 (two) times daily as needed for cough.  Essential hypertension  Acute non-recurrent pansinusitis -     neomycin-polymyxin-hydrocortisone (CORTISPORIN) 3.5-10000-1 ophthalmic suspension; Place 4 drops into the left eye 3 (three) times daily.   I am having Stephanie Joseph start on neomycin-polymyxin-hydrocortisone, fluticasone, oxymetazoline, and dextromethorphan-guaiFENesin. I am also having her maintain her hydrochlorothiazide, hydrOXYzine, venlafaxine, metFORMIN, QUEtiapine, medroxyPROGESTERone, oxybutynin, timolol, methocarbamol, traMADol, glucose blood, accu-chek soft touch, amLODipine, losartan, and amoxicillin-clavulanate. We administered methylPREDNISolone acetate.  Meds ordered this encounter  Medications  . neomycin-polymyxin-hydrocortisone (CORTISPORIN) 3.5-10000-1 ophthalmic suspension    Sig: Place 4 drops into the left eye 3 (three) times daily.    Dispense:  7.5 mL    Refill:  0    Order Specific Question:   Supervising Provider    Answer:   Tresa GarterPLOTNIKOV, ALEKSEI V [[id number]]  . methylPREDNISolone acetate (DEPO-MEDROL) injection 40 mg  . fluticasone (FLONASE) 50 MCG/ACT nasal spray    Sig: Place 2 sprays into both nostrils daily.    Dispense:  16 g    Refill:  0    Order Specific Question:   Supervising  Provider    Answer:   Tresa GarterPLOTNIKOV, ALEKSEI V [[id number]]  . oxymetazoline (AFRIN NASAL SPRAY) 0.05 % nasal spray    Sig: Place 1 spray into both nostrils 2 (two) times daily. Use only for 3days, then stop    Dispense:  30 mL    Refill:  0    Order Specific Question:   Supervising Provider    Answer:   Tresa GarterPLOTNIKOV, ALEKSEI V [[id number]]  . dextromethorphan-guaiFENesin (MUCINEX DM) 30-600 MG 12hr tablet    Sig: Take 1 tablet by mouth 2 (two) times daily as needed for cough.    Dispense:  14 tablet    Refill:  0    Order Specific Question:   Supervising Provider    Answer:   Tresa GarterPLOTNIKOV, ALEKSEI V [[id number]]    Follow-up: Return if symptoms worsen or fail to improve.  Alysia Pennaharlotte Alim Cattell, NP

## 2016-12-08 NOTE — Patient Instructions (Addendum)
Avoid decongestant.  URI Instructions: Flonase and Afrin use: apply 1spray of afrin in each nare, wait , then apply 2sprays of flonase in each nare. Use both nasal spray consecutively x 3days, then flonase only for at least 14days.  Encourage adequate oral hydration.  Use over-the-counter  "cold" medicines  such as "Tylenol cold" , "Advil cold",  "Mucinex" or" Mucinex D"  for cough and congestion.  Avoid decongestants if you have high blood pressure. Use" Delsym" or" Robitussin" cough syrup varietis for cough.  You can use plain "Tylenol" or "Advi"l for fever, chills and achyness.

## 2016-12-09 ENCOUNTER — Other Ambulatory Visit (HOSPITAL_COMMUNITY)

## 2016-12-23 ENCOUNTER — Ambulatory Visit (INDEPENDENT_AMBULATORY_CARE_PROVIDER_SITE_OTHER): Admitting: Podiatry

## 2016-12-23 ENCOUNTER — Encounter: Payer: Self-pay | Admitting: Podiatry

## 2016-12-23 DIAGNOSIS — Q828 Other specified congenital malformations of skin: Secondary | ICD-10-CM | POA: Diagnosis not present

## 2016-12-23 DIAGNOSIS — E119 Type 2 diabetes mellitus without complications: Secondary | ICD-10-CM

## 2016-12-23 NOTE — Progress Notes (Signed)
Patient ID: Stephanie Joseph, female   DOB: 1961-04-24, 56 y.o.   MRN: [id number]   Subjective: This diabetic presents today complaining of multiple nucleated corns on right and left feet and requests debridement of these lesions. Last visit for this similar service was 09/18/2014 Patient is diabetic denies history of foot ulceration, claudication or amputation  Patient denies history of tingling, burning, numbness Patient is a current smoker  Objective:  Orientated 3. Patient's husband present in the treatment room today  Vascular: No peripheral edema noted bilaterally DP and PT pulses 2/4 bilaterally Capillary reflex immediate bilaterally  Neurological: Sensation to 10 g monofilament wire intact 5/5 bilaterally Vibratory sensation reactive bilaterally Ankle reflex equal and reactive bilaterally  Dermatological: Hypertrophic deformed fifth toenails bilaterally The lateral margins the fifth toenails have hyperkeratotic tissue, bilaterally Well-healed surgical scar dorsal right first MPJ Nucleated keratoses lateral fifth nail grooves bilaterally Nucleated keratoses 2 plantar fifth right MPJ Plantar keratoses medial plantar first MPJ bilaterally  Musculoskeletal: HAV deformity left Varus rotation fourth and fifth toes bilaterally Manual motor testing dorsi flexion, plantar flexion, inversion, eversion 5/5 bilaterally  Assessment: Listers corns fifth toes bilaterally Porokeratosis 2 plantar fifth MPJ  Assessment: Diabetic with satisfactory neurovascular status Nucleated keratoses/porokeratosis 4  Plan: Debridement of keratoses 4 without any bleeding  Reappoint as needed or yearly

## 2016-12-23 NOTE — Patient Instructions (Signed)

## 2016-12-25 ENCOUNTER — Other Ambulatory Visit: Payer: Self-pay

## 2016-12-25 ENCOUNTER — Ambulatory Visit (HOSPITAL_COMMUNITY): Attending: Cardiology

## 2016-12-25 DIAGNOSIS — I340 Nonrheumatic mitral (valve) insufficiency: Secondary | ICD-10-CM

## 2016-12-25 DIAGNOSIS — I10 Essential (primary) hypertension: Secondary | ICD-10-CM | POA: Insufficient documentation

## 2016-12-25 DIAGNOSIS — I080 Rheumatic disorders of both mitral and aortic valves: Secondary | ICD-10-CM | POA: Diagnosis not present

## 2016-12-25 DIAGNOSIS — I351 Nonrheumatic aortic (valve) insufficiency: Secondary | ICD-10-CM

## 2016-12-30 ENCOUNTER — Telehealth: Payer: Self-pay

## 2016-12-30 DIAGNOSIS — I340 Nonrheumatic mitral (valve) insufficiency: Secondary | ICD-10-CM

## 2016-12-30 NOTE — Telephone Encounter (Signed)
Notes recorded by Ethelda ChickPate Ingalls, Shaneca Orne, RN on 12/30/2016 at 2:26 PM EDT Patient aware of echo results. Per Dr. Eden EmmsNishan, MR remains moderate AR only mild EF normal f/u echo in a year. Patient verbalized understanding. Will put in recall for echo.

## 2016-12-30 NOTE — Telephone Encounter (Signed)
-----   Message from Wendall StadePeter C Nishan, MD sent at 12/27/2016  1:35 PM EDT ----- MR remains moderate AR only mild EF normal f/u echo in a year

## 2017-02-04 ENCOUNTER — Ambulatory Visit: Admitting: Podiatry

## 2017-02-11 ENCOUNTER — Ambulatory Visit: Admitting: Podiatry

## 2017-02-20 ENCOUNTER — Emergency Department (HOSPITAL_COMMUNITY)
Admission: EM | Admit: 2017-02-20 | Discharge: 2017-02-20 | Disposition: A | Discharge status: Home / Self Care | Attending: Emergency Medicine | Admitting: Emergency Medicine

## 2017-02-20 ENCOUNTER — Encounter (HOSPITAL_COMMUNITY): Payer: Self-pay | Admitting: *Deleted

## 2017-02-20 ENCOUNTER — Emergency Department (HOSPITAL_COMMUNITY)

## 2017-02-20 DIAGNOSIS — F17210 Nicotine dependence, cigarettes, uncomplicated: Secondary | ICD-10-CM | POA: Diagnosis not present

## 2017-02-20 DIAGNOSIS — E119 Type 2 diabetes mellitus without complications: Secondary | ICD-10-CM | POA: Insufficient documentation

## 2017-02-20 DIAGNOSIS — M545 Low back pain, unspecified: Secondary | ICD-10-CM

## 2017-02-20 DIAGNOSIS — Y939 Activity, unspecified: Secondary | ICD-10-CM | POA: Insufficient documentation

## 2017-02-20 DIAGNOSIS — Z79899 Other long term (current) drug therapy: Secondary | ICD-10-CM | POA: Insufficient documentation

## 2017-02-20 DIAGNOSIS — Y998 Other external cause status: Secondary | ICD-10-CM | POA: Diagnosis not present

## 2017-02-20 DIAGNOSIS — M25521 Pain in right elbow: Secondary | ICD-10-CM | POA: Insufficient documentation

## 2017-02-20 DIAGNOSIS — Y92410 Unspecified street and highway as the place of occurrence of the external cause: Secondary | ICD-10-CM | POA: Insufficient documentation

## 2017-02-20 DIAGNOSIS — I10 Essential (primary) hypertension: Secondary | ICD-10-CM | POA: Diagnosis not present

## 2017-02-20 MED ORDER — METHOCARBAMOL 500 MG PO TABS: 500.0000 mg | ORAL_TABLET | Freq: Two times a day (BID) | ORAL | 0 refills | Status: DC

## 2017-02-20 MED ORDER — NAPROXEN 500 MG PO TABS: 500.0000 mg | ORAL_TABLET | Freq: Two times a day (BID) | ORAL | 0 refills | Status: DC

## 2017-02-20 NOTE — Discharge Instructions (Signed)
Take the prescribed medication as directed. °Follow-up with your primary care doctor if any ongoing issues. °Return to the ED for new or worsening symptoms. °
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
## 2017-03-03 ENCOUNTER — Encounter: Payer: Self-pay | Admitting: Family

## 2017-03-03 ENCOUNTER — Ambulatory Visit (INDEPENDENT_AMBULATORY_CARE_PROVIDER_SITE_OTHER): Admitting: Family

## 2017-03-03 VITALS — BP 134/80 | HR 74 | Resp 16 | Ht 67.0 in | Wt 216.0 lb

## 2017-03-03 DIAGNOSIS — M62830 Muscle spasm of back: Secondary | ICD-10-CM | POA: Diagnosis not present

## 2017-03-03 DIAGNOSIS — S5001XA Contusion of right elbow, initial encounter: Secondary | ICD-10-CM | POA: Insufficient documentation

## 2017-03-03 DIAGNOSIS — E119 Type 2 diabetes mellitus without complications: Secondary | ICD-10-CM | POA: Diagnosis not present

## 2017-03-03 DIAGNOSIS — S5001XD Contusion of right elbow, subsequent encounter: Secondary | ICD-10-CM

## 2017-03-03 DIAGNOSIS — I10 Essential (primary) hypertension: Secondary | ICD-10-CM | POA: Diagnosis not present

## 2017-03-03 MED ORDER — METHOCARBAMOL 500 MG PO TABS: 500.0000 mg | ORAL_TABLET | Freq: Two times a day (BID) | ORAL | 0 refills | Status: DC

## 2017-03-03 MED ORDER — AMLODIPINE BESYLATE 10 MG PO TABS: 10.0000 mg | ORAL_TABLET | Freq: Every day | ORAL | 1 refills | Status: DC

## 2017-03-03 MED ORDER — METFORMIN HCL 500 MG PO TABS: 500.0000 mg | ORAL_TABLET | Freq: Every day | ORAL | 0 refills | Status: DC

## 2017-03-03 MED ORDER — TRAMADOL HCL 50 MG PO TABS: 50.0000 mg | ORAL_TABLET | Freq: Four times a day (QID) | ORAL | 0 refills | Status: DC | PRN

## 2017-03-03 NOTE — Assessment & Plan Note (Signed)
Symptoms and exam are consistent with contusion of the elbow with no fractures noted on x-rays from the ED. Continue conservative treatment with ice, naproxen and home exercise therapy. Follow up if symptoms worsen or do not improve.

## 2017-03-03 NOTE — Assessment & Plan Note (Signed)
Symptoms and exam are consistent with lumbar paraspinal muscle spasm that appear adequately controlled with current dosage of robaxin. Continue conservative treatment with ice/moist heat and home exercise therapy. Follow up if symptoms worsen or do not improve.
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
## 2017-03-03 NOTE — Patient Instructions (Addendum)
Thank you for choosing [facility].  SUMMARY AND INSTRUCTIONS:  Ice x 20 minutes every other hour and as needed following activities.  Stretches and exercises daily.  Continue to take your medications as prescribed.  Tramadol as needed for breakthrough pain.  Follow up if symptoms worsen or do not improve.   Medication:  Your prescription(s) have been submitted to your pharmacy or been printed and provided for you. Please take as directed and contact our office if you believe you are having problem(s) with the medication(s) or have any questions.   Follow up:  If your symptoms worsen or fail to improve, please contact our office for further instruction, or in case of emergency go directly to the emergency room at the closest medical facility.    Elbow Contusion An elbow contusion is a deep bruise of the elbow. Deep bruises happen when an injury causes bleeding under the skin. The skin over the deep bruise may turn blue, purple, or yellow. Minor injuries will give you a deep bruise that is painless, but deep bruises that are worse may stay painful and swollen for a few weeks. In general, the best treatment for this condition includes rest, ice, pressure (compression), and elevation. This is often called RICE therapy. Follow these instructions at home: RICE Therapy  Rest the injured area.  If directed, put ice on the injured area: ? Put ice in a plastic bag. ? Place a towel between your skin and the bag. ? Leave the ice on for 20 minutes, 2-3 times per day.  If directed, put light pressure (compression) on the injured area using an elastic bandage. ? Make sure the bandage is not wrapped too tightly. ? Remove and reapply the bandage as told by your doctor.  Raise (elevate) the injured area above the level of your heart while you are sitting or lying down. If you have a splint:  Wear the splint as told by your doctor. Remove it only as told by your doctor.  Loosen the  splint if your fingers tingle, become numb, or turn cold and blue.  Do not let your splint get wet if it is not waterproof.  If your splint is not waterproof, cover it with a watertight plastic bag when you take a bath or a shower.  Keep the splint clean. General instructions  Return to your normal activities as told by your doctor. Ask your doctor what activities are safe for you.  Wear your sling as told by your doctor, if this applies.  Use your elbow only as told by your doctor. You may be asked to do range-of-motion exercises. Do them as told.  Take over-the-counter and prescription medicines only as told by your doctor.  Keep all follow-up visits as told by your doctor. This is important. Contact a doctor if:  Your symptoms do not get better after many days of treatment.  You have more redness, swelling, or pain in your elbow.  You have trouble moving the injured area.  Medicine does not help your pain or swelling. Get help right away if:  You have very bad pain.  You have numbness in your hand or fingers.  Your hand or fingers turn very light (pale) or cold.  You have swelling of your hand and fingers.  You cannot move your fingers or wrist. This information is not intended to replace advice given to you by your health care provider. Make sure you discuss any questions you have with your health care provider.  Document Released: 06/12/2011 Document Revised: 11/29/2015 Document Reviewed: 02/05/2015   Elbow and Forearm Exercises Ask your health care provider which exercises are safe for you. Do exercises exactly as told by your health care provider and adjust them as directed. It is normal to feel mild stretching, pulling, tightness, or discomfort as you do these exercises, but you should stop right away if you feel sudden pain or your pain gets worse.Do not begin these exercises until told by your health care provider. RANGE OF MOTION EXERCISES These exercises warm  up your muscles and joints and improve the movement and flexibility of your injured elbow and forearm. These exercises also help to relieve pain, numbness, and tingling.These exercises are done using the muscles in your injured elbow and forearm. Exercise A: Elbow Flexion, Active 1. Hold your left / right arm at your side, and bend your elbow as far as you can using your left / right arm muscles. 2. Hold this position for __________ seconds. 3. Slowly return to the starting position. Repeat __________ times. Complete this exercise __________ times a day. Exercise B: Elbow Extension, Active 1. Hold your left / right arm at your side, and straighten your elbow as much as you can using your left / right arm muscles. 2. Hold this position for __________ seconds. 3. Slowly return to the starting position. Repeat __________ times. Complete this exercise __________ times a day. Exercise C: Forearm Rotation, Supination, Active 1. Stand or sit with your elbows at your sides. 2. Bend your left / right elbow to an "L" shape (90 degrees). 3. Turn your palm upward until you feel a gentle stretch on the inside of your forearm. 4. Hold this position for __________ seconds. 5. Slowly release and return to the starting position. Repeat __________ times. Complete this exercise __________ times a day. Exercise D: Forearm Rotation, Pronation, Active 1. Stand or sit with your elbows at your side. 2. Bend your left / right elbow to an "L" shape (90 degrees). 3. Turn your left / right palm downward until you feel a gentle stretch on the top of your forearm. 4. Hold this position for __________ seconds. 5. Slowly release and return to the starting position. Repeat__________ times. Complete this exercise __________ times a day. STRETCHING EXERCISES These exercises warm up your muscles and joints and improve the movement and flexibility of your injured elbow and forearm. These exercises also help to relieve pain,  numbness, and tingling.These exercises are done using your healthy elbow and forearm to help stretch the muscles in your injured elbow and forearm. Exercise E: Elbow Flexion, Active-Assisted  1. Hold your left / right arm at your side, and bend your elbow as much as you can using your left / right arm muscles. 2. Use your other hand to bend your left / right elbow farther. To do this, gently push up on your forearm until you feel a gentle stretch on the back of your elbow. 3. Hold this position for __________ seconds. 4. Slowly return to the starting position. Repeat __________ times. Complete this exercise __________ times a day. Exercise F: Elbow Extension, Active-Assisted  1. Hold your left / right arm at your side, and straighten your elbow as much as you can using your left / right arm muscles. 2. Use your other hand to straighten the left / right elbow farther. To do this, gently push down on your forearm until you feel a gentle stretch on the inside of your elbow. 3. Hold this position  for __________ seconds. 4. Slowly return to the starting position. Repeat __________ times. Complete this exercise __________ times a day. Exercise G: Forearm Rotation, Supination, Active-Assisted  1. Sit with your left / right elbow bent in an "L" shape (90 degrees) with your forearm resting on a table. 2. Keeping your upper body and shoulder still, rotate your forearm so your left / right palm faces upward. 3. Use your other hand to help rotate your forearm further until you feel a gentle to moderate stretch. 4. Hold this position for __________ seconds. 5. Slowly release the stretch and return to the starting position. Repeat __________ times. Complete this exercise __________ times a day. Exercise H: Forearm Rotation, Pronation, Active-Assisted  1. Sit with your left / right elbow bent in an "L" shape (90 degrees) with your forearm resting on a table. 2. Keeping your upper body and shoulder still,  rotate your forearm so your palm faces the tabletop. 3. Use your other hand to help rotate your forearm further until you feel a gentle to moderate stretch. 4. Hold this position for __________ seconds. 5. Slowly release the stretch and return to the starting position. Repeat __________ times. Complete this exercise __________ times a day. Exercise I: Elbow Flexion, Supine, Passive 1. Lie on your back. 2. Extend your left / right arm up in the air, bracing it with your other hand. 3. Let your left / right your hand slowly lower toward your shoulder, while your elbow stays pointed toward the ceiling. You should feel a gentle stretch along the back of your upper arm and elbow. 4. If instructed by your health care provider, you may increase the intensity of your stretch by adding a small wrist weight or hand weight. 5. Hold this position for __________ seconds. 6. Slowly return to the starting position. Repeat __________ times. Complete this exercise __________ times a day. Exercise J: Elbow Extension, Supine, Passive  1. Lie on your back. Make sure that you are in a comfortable position that lets you relax your arm muscles. 2. Place a folded towel under your left / right upper arm so your elbow and shoulder are at the same height. Straighten your left / right arm so your elbow does not rest on the bed or towel. 3. Let the weight of your hand stretch your elbow. Keep your arm and chest muscles relaxed. You should feel a stretch on the inside of your elbow. 4. If told by your health care provider, you may increase the intensity of your stretch by adding a small wrist weight or hand weight. 5. Hold this position for__________ seconds. 6. Slowly release the stretch. Repeat __________ times. Complete this exercise __________ times a day. STRENGTHENING EXERCISES These exercises build strength and endurance in your elbow and forearm. Endurance is the ability to use your muscles for a long time, even  after they get tired. Exercise K: Elbow Flexion, Isometric  1. Stand or sit up straight. 2. Bend your left / right elbow in an "L" shape (90 degrees) and turn your palm up so your forearm is at the height of your waist. 3. Place your other hand on top of your forearm. Gently push down as your left / right arm resists. Push as hard as you can with both arms without causing any pain or movement at your left / right elbow. 4. Hold this position for __________ seconds. 5. Slowly release the tension in both arms. Let your muscles relax completely before repeating. Repeat __________ times.  Complete this exercise __________ times a day. Exercise L: Elbow Extensors, Isometric  1. Stand or sit up straight. 2. Place your left / right arm so your palm faces your abdomen and it is at the height of your waist. 3. Place your other hand on the underside of your forearm. Gently push up as your left / right arm resists. Push as hard as you can with both arms, without causing any pain or movement at your left / right elbow. 4. Hold this position for __________ seconds. 5. Slowly release the tension in both arms. Let your muscles relax completely before repeating. Repeat __________ times. Complete this exercise __________ times a day. Exercise M: Elbow Flexion With Forearm Palm Up  1. Sit upright on a firm chair without armrests, or stand. 2. Place your left / right arm at your side with your palm facing forward. 3. Holding a __________weight or gripping a rubber exercise band or tubing, bend your elbow to bring your hand toward your shoulder. 4. Hold this position for __________ seconds. 5. Slowly return to the starting position. Repeat __________times. Complete this exercise __________times a day. Exercise N: Elbow Extension  1. Sit on a firm chair without armrests, or stand. 2. Keeping your upper arms at your sides, bring both hands up toward your left / right shoulder while you grip a rubber exercise  band or tubing. Your left / right hand should be just below the other hand. 3. Straighten your left / right elbow. 4. Hold this position for __________ seconds. 5. Control the resistance of the band or tubing as your hand returns to your side. Repeat __________times. Complete this exercise __________times a day. Exercise O: Forearm Rotation, Supination  1. Sit with your left / right forearm supported on a table. Keep your elbow at waist height. 2. Rest your hand over the edge of the table with your palm facing down. 3. Gently hold a lightweight hammer. 4. Without moving your elbow, slowly rotate your forearm to turn your palm and hand upward to a "thumbs-up" position. 5. Hold this position for __________ seconds. 6. Slowly return to the starting position. Repeat __________times. Complete this exercise __________times a day. Exercise P: Forearm Rotation, Pronation  1. Sit with your left / right forearm supported on a table. Keep your elbow below shoulder height. 2. Rest your hand over the edge of the table with your palm facing up. 3. Gently hold a lightweight hammer. 4. Without moving your elbow, slowly rotate your forearm to turn your palm and hand upward to a "thumbs-up" position. 5. Hold this position for __________seconds. 6. Slowly return to the starting position. Repeat __________times. Complete this exercise __________times a day. This information is not intended to replace advice given to you by your health care provider. Make sure you discuss any questions you have with your health care provider. Document Released: 05/07/2005 Document Revised: 11/01/2015 Document Reviewed: 03/18/2015 Elsevier Interactive Patient Education  2018 [employer].  Risk analyst Patient Education  [employer].

## 2017-03-21 ENCOUNTER — Other Ambulatory Visit: Payer: Self-pay | Admitting: Obstetrics & Gynecology

## 2017-03-21 DIAGNOSIS — Z780 Asymptomatic menopausal state: Secondary | ICD-10-CM

## 2017-03-25 NOTE — Telephone Encounter (Signed)
Patient states she is running out of the Provera and she needs a refill. Call to patient- told patient that she is due a annual exam and needs to make an appointment- she states she is going out of town and will follow up when she gets back. Told patient we could refill her 2 months- but she will need to schedule.

## 2017-04-28 ENCOUNTER — Encounter: Payer: Self-pay | Admitting: Family Medicine

## 2017-04-28 ENCOUNTER — Ambulatory Visit (INDEPENDENT_AMBULATORY_CARE_PROVIDER_SITE_OTHER): Admitting: Family Medicine

## 2017-04-28 VITALS — BP 138/76 | HR 75 | Temp 98.6°F | Ht 67.0 in | Wt 208.0 lb

## 2017-04-28 DIAGNOSIS — K0889 Other specified disorders of teeth and supporting structures: Secondary | ICD-10-CM

## 2017-04-28 MED ORDER — MELOXICAM 15 MG PO TABS: 15.0000 mg | ORAL_TABLET | Freq: Every day | ORAL | 0 refills | Status: DC

## 2017-04-28 MED ORDER — AMOXICILLIN-POT CLAVULANATE 875-125 MG PO TABS: 1.0000 | ORAL_TABLET | Freq: Two times a day (BID) | ORAL | 0 refills | Status: DC

## 2017-04-28 NOTE — Assessment & Plan Note (Signed)
Appears to have an infected tooth and knows that she needs to have a pulled per her dentist report from prior visits. Does not have any soft tissue or peritonsillar abscess. - Augmentin and meloxicam provided - Given indications to return.

## 2017-04-28 NOTE — Patient Instructions (Addendum)
Thank you for coming in,   Please try to have the tooth pulled.   Please try to take a probiotic with the antibiotic.   Please feel free to call with any questions or concerns at any time, at [phone]. --Dr. Jordan LikesSchmitz

## 2017-04-28 NOTE — Progress Notes (Signed)
Stephanie Joseph - 56 y.o. female MRN [id number]  Date of birth: Nov 11, 1960  SUBJECTIVE:  Including CC & ROS.  Chief Complaint  Patient presents with  . Dental Pain    Left upper tooth-she was seen 3 weeks ago at the dentist-She states he recommended pulling the tooth but she could not afford it.     Stephanie Joseph is a 56 year old female is presenting with left sided upper tooth pain. Pain has been ongoing for one week. She was seen the dentist a few weeks ago was told it needed to be pulled. She did not have the finances to have that expense at this time. She denies any fevers or chills. Does not have any trouble swallowing. Has had other teeth removed and does wear dentures. The pain is worse with drinking hot or cold liquids. Has tried gargling with peroxide and been using a mouthwash to numb the pain. The pain is throbbing in nature.   Review of her lab work from 3/16 shows normal kidney function and significantly vitamin D deficient. Shows an A1c of 6.4.   Review of Systems  Constitutional: Negative for fever.  HENT: Positive for dental problem. Negative for trouble swallowing.     HISTORY: Past Medical, Surgical, Social, and Family History Reviewed & Updated per EMR.   Pertinent Historical Findings include:  Past Medical History:  Diagnosis Date  . Anemia, iron deficiency   . Arthritis   . Depression   . Diabetes mellitus without complication (HCC)   . Dysmenorrhea   . Gout   . Heart valve problem   . Hypertension   . Menorrhagia   . Mitral regurgitation   . Pneumonia     Past Surgical History:  Procedure Laterality Date  . KNEE ARTHROSCOPY     Left    No Known Allergies  Family History  Problem Relation Age of Onset  . Hypertension Mother   . Stroke Paternal Grandmother   . Heart disease Sister   . Heart disease Brother      Social History   Social History  . Marital status: Married    Spouse name: N/A  . Number of children: 1  . Years of education: 25     Occupational History  . Disability Pending    Social History Main Topics  . Smoking status: Current Every Day Smoker    Packs/day: 0.50    Years: 7.00    Types: Cigarettes  . Smokeless tobacco: Never Used  . Alcohol use No  . Drug use: No  . Sexual activity: Yes    Birth control/ protection: None   Other Topics Concern  . Not on file   Social History Narrative   Fun: Go to church   Denies abuse and feels safe at home.      PHYSICAL EXAM:  VS: BP 138/76 (BP Location: Left Arm, Patient Position: Sitting, Cuff Size: Normal)   Pulse 75   Temp 98.6 F (37 C) (Oral)   Ht 5\' 7"  (1.702 m)   Wt 208 lb (94.3 kg)   LMP 02/13/2014 (Within Days)   SpO2 99%   BMI 32.58 kg/m  Physical Exam Gen: NAD, alert, cooperative with exam,  ENT: normal lips, normal nasal mucosa, left upper molar has tenderness to palpation at the base and appears to have gouty. Denture in place and routed at this base is well. Eye: normal EOM, normal conjunctiva and lids CV:  no edema, +2 pedal pulses   Resp: no accessory muscle use,  non-labored,  Skin: no rashes, no areas of induration  Neuro: normal tone, normal sensation to touch Psych:  normal insight, alert and oriented MSK: Normal gait, normal strength      ASSESSMENT & PLAN:   Tooth pain Appears to have an infected tooth and knows that she needs to have a pulled per her dentist report from prior visits. Does not have any soft tissue or peritonsillar abscess. - Augmentin and meloxicam provided - Given indications to return.

## 2017-05-21 ENCOUNTER — Ambulatory Visit (INDEPENDENT_AMBULATORY_CARE_PROVIDER_SITE_OTHER): Admitting: Nurse Practitioner

## 2017-05-21 ENCOUNTER — Encounter: Payer: Self-pay | Admitting: Nurse Practitioner

## 2017-05-21 VITALS — BP 152/82 | HR 97 | Temp 98.4°F | Resp 16 | Ht 67.0 in | Wt 221.0 lb

## 2017-05-21 DIAGNOSIS — H409 Unspecified glaucoma: Secondary | ICD-10-CM | POA: Diagnosis not present

## 2017-05-21 DIAGNOSIS — K0889 Other specified disorders of teeth and supporting structures: Secondary | ICD-10-CM | POA: Diagnosis not present

## 2017-05-21 DIAGNOSIS — G8929 Other chronic pain: Secondary | ICD-10-CM

## 2017-05-21 DIAGNOSIS — M545 Low back pain: Secondary | ICD-10-CM | POA: Diagnosis not present

## 2017-05-21 MED ORDER — METHOCARBAMOL 500 MG PO TABS: 500.0000 mg | ORAL_TABLET | Freq: Two times a day (BID) | ORAL | 0 refills | Status: DC

## 2017-05-21 MED ORDER — AMOXICILLIN-POT CLAVULANATE 875-125 MG PO TABS: 1.0000 | ORAL_TABLET | Freq: Two times a day (BID) | ORAL | 0 refills | Status: DC

## 2017-05-21 MED ORDER — TIMOLOL HEMIHYDRATE 0.25 % OP SOLN: 1.0000 [drp] | Freq: Two times a day (BID) | OPHTHALMIC | 3 refills | Status: DC

## 2017-05-21 MED ORDER — GLUCOSE BLOOD VI STRP: ORAL_STRIP | 2 refills | Status: DC

## 2017-05-21 MED ORDER — ACCU-CHEK SOFT TOUCH LANCETS MISC: 12 refills | Status: AC

## 2017-05-21 MED ORDER — TRAMADOL HCL 50 MG PO TABS: 50.0000 mg | ORAL_TABLET | Freq: Four times a day (QID) | ORAL | 0 refills | Status: DC | PRN

## 2017-05-21 NOTE — Assessment & Plan Note (Signed)
Maintained on timolol. Follows with opthalmologist for routine monitor. Will continue timolol and routine opthalmology follow up.

## 2017-05-21 NOTE — Progress Notes (Signed)
Subjective:    Patient ID: Stephanie Joseph, female    DOB: 09-17-1960, 56 y.o.   MRN: [id number]  HPI [name] is a 56 yo female who presents today to establish care. She is transferring to me from another provider in the same clinic.  Back pain- chronic lower back. Degenerative disk and chronic L1 compression fracture found on lumbar xray this summer. She has been maintained on tramadol, robaxin. She takes the medications daily with relief of pain. She denies any adverse effects of the medications. She denies weakness, numbness, loss of bowel or bladder control.  Glaucoma- maintained on timolol drops. Reports daily compliance. She follows with opthalmology regularly for monitoring of her eye pressure.  Tooth pain- left upper tooth. She took a 10 day course of augmentin for this which she just completed last week. The pain returned a few days after completion of the augmentin. She denies fevers, swelling.  Review of Systems See HPI  Past Medical History:  Diagnosis Date  . Anemia, iron deficiency   . Arthritis   . Depression   . Diabetes mellitus without complication (HCC)   . Dysmenorrhea   . Gout   . Heart valve problem   . Hypertension   . Menorrhagia   . Mitral regurgitation   . Pneumonia      Social History   Socioeconomic History  . Marital status: Married    Spouse name: Not on file  . Number of children: 1  . Years of education: 3212  . Highest education level: Not on file  Social Needs  . Financial resource strain: Not on file  . Food insecurity - worry: Not on file  . Food insecurity - inability: Not on file  . Transportation needs - medical: Not on file  . Transportation needs - non-medical: Not on file  Occupational History  . Occupation: Disability Pending  Tobacco Use  . Smoking status: Current Every Day Smoker    Packs/day: 0.50    Years: 7.00    Pack years: 3.50    Types: Cigarettes  . Smokeless tobacco: Never Used  Substance and Sexual  Activity  . Alcohol use: No  . Drug use: No  . Sexual activity: Yes    Birth control/protection: None  Other Topics Concern  . Not on file  Social History Narrative   Fun: Go to church   Denies abuse and feels safe at home.     Past Surgical History:  Procedure Laterality Date  . KNEE ARTHROSCOPY     Left    Family History  Problem Relation Age of Onset  . Hypertension Mother   . Stroke Paternal Grandmother   . Heart disease Sister   . Heart disease Brother     No Known Allergies  Current Outpatient Medications on File Prior to Visit  Medication Sig Dispense Refill  . amLODipine (NORVASC) 10 MG tablet Take 1 tablet (10 mg total) by mouth daily. 90 tablet 1  . estradiol (ESTRACE) 1 MG tablet TAKE ONE TABLET BY MOUTH ONCE DAILY 30 tablet 2  . glucose blood (ACCU-CHEK AVIVA) test strip Use as instructed 50 each 2  . hydrochlorothiazide 25 MG tablet Take 25 mg by mouth daily.      . hydrOXYzine (ATARAX/VISTARIL) 50 MG tablet Take 50 mg by mouth 3 (three) times daily as needed for itching.    . Lancets (ACCU-CHEK SOFT TOUCH) lancets Use as instructed 100 each 12  . losartan (COZAAR) 100 MG tablet Take 1 tablet (  100 mg total) by mouth daily. 30 tablet 0  . medroxyPROGESTERone (PROVERA) 5 MG tablet TAKE ONE TABLET BY MOUTH ONCE DAILY 30 tablet 2  . meloxicam (MOBIC) 15 MG tablet Take 1 tablet (15 mg total) by mouth daily. 30 tablet 0  . metFORMIN (GLUCOPHAGE) 500 MG tablet Take 1 tablet (500 mg total) by mouth daily. 90 tablet 0  . methocarbamol (ROBAXIN) 500 MG tablet Take 1 tablet (500 mg total) by mouth 2 (two) times daily. 60 tablet 0  . oxybutynin (DITROPAN-XL) 5 MG 24 hr tablet Take 1 tablet (5 mg total) by mouth at bedtime. 30 tablet 6  . QUEtiapine (SEROQUEL) 50 MG tablet Take 50 mg by mouth at bedtime.    . traMADol (ULTRAM) 50 MG tablet Take 1 tablet (50 mg total) by mouth every 6 (six) hours as needed. 120 tablet 0  . venlafaxine (EFFEXOR) 75 MG tablet Take 75 mg by  mouth 3 (three) times daily with meals.     No current facility-administered medications on file prior to visit.     BP (!) 152/82 (BP Location: Left Arm, Patient Position: Sitting, Cuff Size: Large)   Pulse 97   Temp 98.4 F (36.9 C) (Oral)   Resp 16   Ht 5\' 7"  (1.702 m)   Wt 221 lb (100.2 kg)   LMP 02/13/2014 (Within Days)   SpO2 98%   BMI 34.61 kg/m       Objective:   Physical Exam  Constitutional: She is oriented to person, place, and time. She appears well-developed and well-nourished. No distress.  HENT:  Head: Normocephalic and atraumatic.  Poor dentition. Tenderness to left upper cheek and gum.  Cardiovascular: Normal rate, regular rhythm and intact distal pulses.  Pulmonary/Chest: Effort normal and breath sounds normal.  Musculoskeletal: She exhibits no edema, tenderness or deformity.  Neurological: She is alert and oriented to person, place, and time. Coordination normal.  Skin: Skin is warm and dry.  Psychiatric: She has a normal mood and affect. Judgment and thought content normal.      Assessment & Plan:  Shell return in 3 months for f/u or sooner if needed.  BP elevated today. She did not take her medicatons. She will take once home.  Tooth pain- tenderness to tooth on exam, poor dentition, suspect active infection Recent augmentin course which provided temporary relief Will prescribe another course of augmentin Discussed importance of following up with a dentist. She is having trouble affording dental care but is actively working on it. Additional resources provided.

## 2017-05-21 NOTE — Assessment & Plan Note (Signed)
Maintained on tramadol, robaxin. Recent imaging supports complaint of back pain.  She was going to PT but stopped because she could not afford it. She does not want to go to pain management. We discussed today the risk of tramadol combined with her other medications and the need to consider medication adjustments or alternative therapies to pain. She would like to discuss this on next visit.

## 2017-05-21 NOTE — Patient Instructions (Addendum)
  I have provided you with printed refills at your request.  I will not be able to continue to fill your tramadol after this, as it can react with your effexor and cause a bad reaction in your body.  I have provided you with an additional 10 days of augmentin for your dental infection. You must follow up with a dentist for this infection as soon as possible. Additional dental resources given.  Id like to see you back in 3 months to follow up.  It was nice to meet you. Thanks for letting me take care of you today :)

## 2017-06-09 ENCOUNTER — Ambulatory Visit (INDEPENDENT_AMBULATORY_CARE_PROVIDER_SITE_OTHER): Admitting: Obstetrics & Gynecology

## 2017-06-09 ENCOUNTER — Encounter: Payer: Self-pay | Admitting: Obstetrics & Gynecology

## 2017-06-09 DIAGNOSIS — R350 Frequency of micturition: Secondary | ICD-10-CM | POA: Diagnosis not present

## 2017-06-09 MED ORDER — OXYBUTYNIN CHLORIDE ER 5 MG PO TB24: 5.0000 mg | ORAL_TABLET | Freq: Every day | ORAL | 6 refills | Status: DC

## 2017-06-09 NOTE — Progress Notes (Signed)
Subjective:     Patient ID: Stephanie Joseph, female   DOB: [birth year], 56 y.o.   MRN: [id number] Cc: f/u medication for urinary frequency [id number] Patient's last menstrual period was 02/13/2014 (within days). She is taking Ditropan for urinary frequency and she is satisfied with the effectiveness and she wants to continue the medication. Current Outpatient Medications on File Prior to Visit  Medication Sig Dispense Refill  . amLODipine (NORVASC) 10 MG tablet Take 1 tablet (10 mg total) by mouth daily. 90 tablet 1  . amoxicillin-clavulanate (AUGMENTIN) 875-125 MG tablet Take 1 tablet 2 (two) times daily by mouth. 20 tablet 0  . estradiol (ESTRACE) 1 MG tablet TAKE ONE TABLET BY MOUTH ONCE DAILY 30 tablet 2  . glucose blood (ACCU-CHEK AVIVA) test strip Use as instructed 50 each 2  . hydrochlorothiazide 25 MG tablet Take 25 mg by mouth daily.      . hydrOXYzine (ATARAX/VISTARIL) 50 MG tablet Take 50 mg by mouth 3 (three) times daily as needed for itching.    . Lancets (ACCU-CHEK SOFT TOUCH) lancets Use as instructed 100 each 12  . losartan (COZAAR) 100 MG tablet Take 1 tablet (100 mg total) by mouth daily. 30 tablet 0  . medroxyPROGESTERone (PROVERA) 5 MG tablet TAKE ONE TABLET BY MOUTH ONCE DAILY 30 tablet 2  . meloxicam (MOBIC) 15 MG tablet Take 1 tablet (15 mg total) by mouth daily. 30 tablet 0  . methocarbamol (ROBAXIN) 500 MG tablet Take 1 tablet (500 mg total) 2 (two) times daily by mouth. 60 tablet 0  . QUEtiapine (SEROQUEL) 50 MG tablet Take 50 mg by mouth at bedtime.    . timolol (BETIMOL) 0.25 % ophthalmic solution Apply 1 drop 2 (two) times daily to eye. 10 mL 3  . traMADol (ULTRAM) 50 MG tablet Take 1 tablet (50 mg total) every 6 (six) hours as needed by mouth. 120 tablet 0  . venlafaxine (EFFEXOR) 75 MG tablet Take 75 mg by mouth 3 (three) times daily with meals.    . metFORMIN (GLUCOPHAGE) 500 MG tablet Take 1 tablet (500 mg total) by mouth daily. (Patient not taking: Reported on  06/09/2017) 90 tablet 0   No current facility-administered medications on file prior to visit.   No Known Allergies Patient Active Problem List   Diagnosis Date Noted  . Glaucoma 05/21/2017  . Tooth pain 04/28/2017  . Routine adult health maintenance 09/19/2016  . Class 1 obesity due to excess calories with serious comorbidity and body mass index (BMI) of 33.0 to 33.9 in adult 09/19/2016  . Vitamin D deficiency 09/19/2016  . Urinary frequency 02/14/2016  . Back pain 10/18/2015  . Type 2 diabetes mellitus (HCC) 07/10/2015  . Hormone replacement therapy (postmenopausal) 10/04/2014  . Mitral regurgitation 11/05/2012  . Aortic valve disease 10/21/2011  . Menorrhagia, premenopausal 09/01/2011  . Depression 03/04/2010  . GOUT 12/16/2006  . ANEMIA, IRON DEFICIENCY NOS 12/16/2006  . TOBACCO ABUSE 12/16/2006  . Essential hypertension 12/16/2006       Review of Systems     Objective:   Physical Exam  Constitutional: She is oriented to person, place, and time. She appears well-developed. No distress.  Pulmonary/Chest: Effort normal.  Neurological: She is alert and oriented to person, place, and time.  Psychiatric: She has a normal mood and affect. Her behavior is normal.  Vitals reviewed.      Assessment:     OAB well controlled with ditropan HTN, did not take her medication today    Plan:  Continue her Ditropan and Rx was renewed F/u as scheduled with primary care and her Rx can be reordered by them  Adam PhenixArnold, James G, MD 06/09/2017

## 2017-06-09 NOTE — Patient Instructions (Signed)

## 2017-06-10 NOTE — Progress Notes (Deleted)
Patient ID: Stephanie Joseph, female   DOB: Nov 20, 1960, 56 y.o.   MRN: [id number]   [age] y.o.  referred for murmur and abnormal echo 06/2014 . Reviewed echo read by Dr Lynnea FerrierSolomon in September 2013 . He indicated severe MR with no frank prolapse or flail. LV size and funciton normal. 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 BP has been better with ARB   Echo 12/25/16 reviewed EF 55% Mild AR Moderate MR Mild LAE Estimated PA 32 mmHg  Husband of 13 years acting up left to [place] and did crack and drank white alcohol Has DM and been sick since he returned   ROS: Denies fever, malais, weight loss, blurry vision, decreased visual acuity, cough, sputum, SOB, hemoptysis, pleuritic pain, palpitaitons, heartburn, abdominal pain, melena, lower extremity edema, claudication, or rash.  All other systems reviewed and negative  General: Affect appropriate Overweight black female  HEENT: normal Neck supple with no adenopathy JVP normal no bruits no thyromegaly Lungs clear with no wheezing and good diaphragmatic motion Heart:  S1/S2 mild AS/AR murmur, no rub, gallop or click PMI normal Abdomen: benighn, BS positve, no tenderness, no AAA no bruit.  No HSM or HJR Distal pulses intact with no bruits No edema Neuro non-focal Skin warm and dry No muscular weakness   Current Outpatient Medications  Medication Sig Dispense Refill  . amLODipine (NORVASC) 10 MG tablet Take 1 tablet (10 mg total) by mouth daily. 90 tablet 1  . amoxicillin-clavulanate (AUGMENTIN) 875-125 MG tablet Take 1 tablet 2 (two) times daily by mouth. 20 tablet 0  . estradiol (ESTRACE) 1 MG tablet TAKE ONE TABLET BY MOUTH ONCE DAILY 30 tablet 2  . glucose blood (ACCU-CHEK AVIVA) test strip Use as instructed 50 each 2  . hydrochlorothiazide 25 MG tablet Take 25 mg by mouth daily.      . hydrOXYzine (ATARAX/VISTARIL) 50 MG tablet Take 50 mg by mouth 3 (three) times daily as needed for itching.    . Lancets  (ACCU-CHEK SOFT TOUCH) lancets Use as instructed 100 each 12  . losartan (COZAAR) 100 MG tablet Take 1 tablet (100 mg total) by mouth daily. 30 tablet 0  . medroxyPROGESTERone (PROVERA) 5 MG tablet TAKE ONE TABLET BY MOUTH ONCE DAILY 30 tablet 2  . meloxicam (MOBIC) 15 MG tablet Take 1 tablet (15 mg total) by mouth daily. 30 tablet 0  . metFORMIN (GLUCOPHAGE) 500 MG tablet Take 1 tablet (500 mg total) by mouth daily. (Patient not taking: Reported on 06/09/2017) 90 tablet 0  . methocarbamol (ROBAXIN) 500 MG tablet Take 1 tablet (500 mg total) 2 (two) times daily by mouth. 60 tablet 0  . oxybutynin (DITROPAN-XL) 5 MG 24 hr tablet Take 1 tablet (5 mg total) by mouth at bedtime. 30 tablet 6  . QUEtiapine (SEROQUEL) 50 MG tablet Take 50 mg by mouth at bedtime.    . timolol (BETIMOL) 0.25 % ophthalmic solution Apply 1 drop 2 (two) times daily to eye. 10 mL 3  . traMADol (ULTRAM) 50 MG tablet Take 1 tablet (50 mg total) every 6 (six) hours as needed by mouth. 120 tablet 0  . venlafaxine (EFFEXOR) 75 MG tablet Take 75 mg by mouth 3 (three) times daily with meals.     No current facility-administered medications for this visit.     Allergies  Patient has no known allergies.  Electrocardiogram:  5/15  SR rate 67 normal  07/27/15  SR rate 73  Normal   Assessment  and Plan  MR /AR no change in murmur f/u echo  June 2019 no need for SBE  Bipolar: stable continue current meds [id number]:Discussed low carb diet.  Target hemoglobin A1c is 6.5 or less.  Continue current medications. HTN: Well controlled.  Continue current medications and low sodium Dash type diet.    F/u with me in 6 months    Charlton HawsPeter Khamari Yousuf

## 2017-06-12 ENCOUNTER — Ambulatory Visit: Admitting: Cardiovascular Disease

## 2017-07-10 ENCOUNTER — Other Ambulatory Visit: Payer: Self-pay | Admitting: Obstetrics & Gynecology

## 2017-07-10 DIAGNOSIS — Z780 Asymptomatic menopausal state: Secondary | ICD-10-CM

## 2017-07-23 ENCOUNTER — Other Ambulatory Visit: Payer: Self-pay | Admitting: Obstetrics & Gynecology

## 2017-07-23 DIAGNOSIS — Z780 Asymptomatic menopausal state: Secondary | ICD-10-CM

## 2017-07-23 MED ORDER — MEDROXYPROGESTERONE ACETATE 5 MG PO TABS: 5.0000 mg | ORAL_TABLET | Freq: Every day | ORAL | 5 refills | Status: DC

## 2017-07-23 MED ORDER — ESTRADIOL 1 MG PO TABS: 1.0000 mg | ORAL_TABLET | Freq: Every day | ORAL | 5 refills | Status: DC

## 2017-07-30 LAB — HM DIABETES EYE EXAM

## 2017-08-03 NOTE — Progress Notes (Signed)
Patient ID: Stephanie Joseph, female   DOB: 1961-03-13, 57 y.o.   MRN: [id number]   57 y.o.  referred for murmur and abnormal echo 06/2014 . Reviewed echo read by Dr Lynnea Ferrier in September 2013 . He indicated severe MR with no frank prolapse or flail. LV size and funciton normal. 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 Denies appetite suppresant use, RF, SBE. 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 My review indicated moderate asymptomatic MR f/u echo's have not changed  Echo reviewed 12/25/16  LV normal size EF 55% Mild AR Moderate MR Mild LAE Estimated PA only 32 mmHg   Husband of 12 years acting up left to [place] and did crack and drank white alcohol Has DM and been sick since he returned   ROS: Denies fever, malais, weight loss, blurry vision, decreased visual acuity, cough, sputum, SOB, hemoptysis, pleuritic pain, palpitaitons, heartburn, abdominal pain, melena, lower extremity edema, claudication, or rash.  All other systems reviewed and negative  General: Affect appropriate Obese black female  HEENT: normal Neck supple with no adenopathy JVP normal no bruits no thyromegaly Lungs clear with no wheezing and good diaphragmatic motion Heart:  S1/S2 AS/AR murmur, no rub, gallop or click PMI normal Abdomen: benighn, BS positve, no tenderness, no AAA no bruit.  No HSM or HJR Distal pulses intact with no bruits No edema Neuro non-focal Skin warm and dry No muscular weakness    Current Outpatient Medications  Medication Sig Dispense Refill  . amLODipine (NORVASC) 10 MG tablet Take 1 tablet (10 mg total) by mouth daily. 90 tablet 1  . brimonidine-timolol (COMBIGAN) 0.2-0.5 % ophthalmic solution Place 1 drop into both eyes every 12 (twelve) hours.    Marland Kitchen estradiol (ESTRACE) 1 MG tablet Take 1 tablet (1 mg total) by mouth daily. 30 tablet 5  . glucose blood (ACCU-CHEK AVIVA) test strip Use as instructed 50 each 2  .  hydrochlorothiazide 25 MG tablet Take 25 mg by mouth daily.      . hydrOXYzine (ATARAX/VISTARIL) 50 MG tablet Take 50 mg by mouth 3 (three) times daily as needed for itching.    . Lancets (ACCU-CHEK SOFT TOUCH) lancets Use as instructed 100 each 12  . losartan (COZAAR) 100 MG tablet Take 1 tablet (100 mg total) by mouth daily. 30 tablet 0  . medroxyPROGESTERone (PROVERA) 5 MG tablet Take 1 tablet (5 mg total) by mouth daily. 30 tablet 5  . metFORMIN (GLUCOPHAGE) 500 MG tablet Take 1 tablet (500 mg total) by mouth daily. 90 tablet 0  . methocarbamol (ROBAXIN) 500 MG tablet Take 1 tablet (500 mg total) 2 (two) times daily by mouth. 60 tablet 0  . oxybutynin (DITROPAN-XL) 5 MG 24 hr tablet Take 1 tablet (5 mg total) by mouth at bedtime. 30 tablet 6  . QUEtiapine (SEROQUEL) 50 MG tablet Take 50 mg by mouth at bedtime.    . timolol (BETIMOL) 0.25 % ophthalmic solution Apply 1 drop 2 (two) times daily to eye. 10 mL 3  . traMADol (ULTRAM) 50 MG tablet Take 1 tablet (50 mg total) every 6 (six) hours as needed by mouth. 120 tablet 0  . venlafaxine (EFFEXOR) 75 MG tablet Take 75 mg by mouth 3 (three) times daily with meals.     No current facility-administered medications for this visit.     Allergies  Patient has no known allergies.  Electrocardiogram:  5/15  SR rate 67 normal  07/27/15  SR rate 73  Normal  08/04/17 SR rate 67 normal  Assessment and Plan  MR /AR no change in murmur f/u echo June 2019no need for SBE  Bipolar: stable continue current meds [id number]:Discussed low carb diet.  Target hemoglobin A1c is 6.5 or less.  Continue current medications. HTN: Well controlled.  Continue current medications and low sodium Dash type diet.    F/u with me in a year echo June 2019   Charlton HawsPeter Nishan

## 2017-08-04 ENCOUNTER — Ambulatory Visit (INDEPENDENT_AMBULATORY_CARE_PROVIDER_SITE_OTHER): Admitting: Cardiovascular Disease

## 2017-08-04 ENCOUNTER — Encounter: Payer: Self-pay | Admitting: Cardiovascular Disease

## 2017-08-04 VITALS — BP 152/78 | HR 66 | Ht 67.0 in | Wt 221.0 lb

## 2017-08-04 DIAGNOSIS — I351 Nonrheumatic aortic (valve) insufficiency: Secondary | ICD-10-CM | POA: Diagnosis not present

## 2017-08-04 DIAGNOSIS — I340 Nonrheumatic mitral (valve) insufficiency: Secondary | ICD-10-CM | POA: Diagnosis not present

## 2017-08-04 DIAGNOSIS — I10 Essential (primary) hypertension: Secondary | ICD-10-CM

## 2017-08-04 NOTE — Patient Instructions (Addendum)
Medication Instructions:  Your physician recommends that you continue on your current medications as directed. Please refer to the Current Medication list given to you today.  Labwork: NONE  Testing/Procedures: Your physician has requested that you have an echocardiogram in June. Echocardiography is a painless test that uses sound waves to create images of your heart. It provides your doctor with information about the size and shape of your heart and how well your heart's chambers and valves are working. This procedure takes approximately one hour. There are no restrictions for this procedure.  Follow-Up: Your physician wants you to follow-up in: 12 months with Dr. Nishan. You will receive a reminder letter in the mail two months in advance. If you don't receive a letter, please call our office to schedule the follow-up appointment.   If you need a refill on your cardiac medications before your next appointment, please call your pharmacy.    

## 2017-08-18 ENCOUNTER — Telehealth: Payer: Self-pay | Admitting: Nurse Practitioner

## 2017-08-18 DIAGNOSIS — G8929 Other chronic pain: Secondary | ICD-10-CM

## 2017-08-18 DIAGNOSIS — M545 Low back pain: Principal | ICD-10-CM

## 2017-08-18 NOTE — Telephone Encounter (Signed)
Copied from CRM [phone]. Topic: Quick Communication - Rx Refill/Question >> Aug 18, 2017  2:43 PM Cipriano BunkerLambe, Annette S wrote: Medication: methocarbamol (ROBAXIN) 500 MG tablet   Has the patient contacted their pharmacy? Yes.     (Agent: If no, request that the patient contact the pharmacy for the refill.)  Pt. Takes 2 a day and does not have appt. Until April.  Can she get 60 days worth until appt.   [facility] [place], but needs now.    Preferred Pharmacy (with phone number or street name): [facility] [address] [place], [place] - [address] [place] [address] Phone: [phone] Fax: [phone]   Agent: Please be advised that RX refills may take up to 3 business days. We ask that you follow-up with your pharmacy.

## 2017-08-18 NOTE — Telephone Encounter (Signed)
Robaxin 500 mg refill Last OV: 05/21/17 Last Refill:05/21/17 60 tab/0 refill Pharmacy:[facility] [phone]

## 2017-08-19 MED ORDER — METHOCARBAMOL 500 MG PO TABS: 500.0000 mg | ORAL_TABLET | Freq: Two times a day (BID) | ORAL | 0 refills | Status: DC

## 2017-08-19 MED ORDER — TRAMADOL HCL 50 MG PO TABS: 50.0000 mg | ORAL_TABLET | Freq: Four times a day (QID) | ORAL | 0 refills | Status: DC | PRN

## 2017-08-19 NOTE — Telephone Encounter (Signed)
Pls advise if ok to refill../lmb 

## 2017-08-19 NOTE — Telephone Encounter (Signed)
Robaxin refill sent

## 2017-08-19 NOTE — Addendum Note (Signed)
Addended by: Evaristo BurySHAMBLEY, Rosebud Koenen N on: 08/19/2017 05:03 PM   Modules accepted: Orders

## 2017-09-11 ENCOUNTER — Ambulatory Visit (INDEPENDENT_AMBULATORY_CARE_PROVIDER_SITE_OTHER): Admitting: Nurse Practitioner

## 2017-09-11 ENCOUNTER — Other Ambulatory Visit (INDEPENDENT_AMBULATORY_CARE_PROVIDER_SITE_OTHER)

## 2017-09-11 ENCOUNTER — Encounter: Payer: Self-pay | Admitting: Nurse Practitioner

## 2017-09-11 VITALS — BP 140/80 | HR 77 | Temp 98.6°F | Resp 16 | Ht 67.0 in | Wt 224.0 lb

## 2017-09-11 DIAGNOSIS — R070 Pain in throat: Secondary | ICD-10-CM

## 2017-09-11 DIAGNOSIS — J029 Acute pharyngitis, unspecified: Secondary | ICD-10-CM | POA: Diagnosis not present

## 2017-09-11 LAB — CBC
HCT: 36.2 % (ref 36.0–46.0)
Hemoglobin: 11.8 g/dL — ABNORMAL LOW (ref 12.0–15.0)
MCHC: 32.7 g/dL (ref 30.0–36.0)
MCV: 83.5 fl (ref 78.0–100.0)
Platelets: 281 10*3/uL (ref 150.0–400.0)
RBC: 4.33 Mil/uL (ref 3.87–5.11)
RDW: 15.2 % (ref 11.5–15.5)
WBC: 5.8 10*3/uL (ref 4.0–10.5)

## 2017-09-11 LAB — POCT RAPID STREP A (OFFICE): Rapid Strep A Screen: NEGATIVE

## 2017-09-11 LAB — SEDIMENTATION RATE: Sed Rate: 33 mm/hr — ABNORMAL HIGH (ref 0–30)

## 2017-09-11 MED ORDER — CEFDINIR 300 MG PO CAPS: 300.0000 mg | ORAL_CAPSULE | Freq: Two times a day (BID) | ORAL | 0 refills | Status: DC

## 2017-09-11 NOTE — Patient Instructions (Addendum)
Please head downstairs for lab work.  Please start omnicef 300mg  twice daily for 10 days.  Please return next week so I can make sure you are improving.  Please go to the ER immediately for throat swelling or difficulty breathing, chest pain, or shortness of breath.

## 2017-09-11 NOTE — Progress Notes (Signed)
Name: Stephanie Joseph   MRN: [id number]    DOB: 23-Oct-1960   Date:09/11/2017       Progress Note  Subjective  Chief Complaint  Chief Complaint  Patient presents with  . Sore Throat    x3 days has had a sore throat    HPI  Throat pain- This is an acute problem This problem began about 3 days ago She describes as a "painful spot" under her chin to anterior neck. She almost went to the ER because the pain was so bad The pain is worse with certain head movements and when she lies down flat. She tried hot tea, throat spray with no relief She did not try any other medications or treatments because she was nervous to combine OTC medications with her prescription medications She denies fevers, chills, body aches, fatigue, headaches, nasal congestion, sore throat, dental pain, ear pain, chest pain, shortness of breath, rash. She denies any injuries to her neck.   Patient Active Problem List   Diagnosis Date Noted  . Glaucoma 05/21/2017  . Tooth pain 04/28/2017  . Routine adult health maintenance 09/19/2016  . Class 1 obesity due to excess calories with serious comorbidity and body mass index (BMI) of 33.0 to 33.9 in adult 09/19/2016  . Vitamin D deficiency 09/19/2016  . Urinary frequency 02/14/2016  . Back pain 10/18/2015  . Type 2 diabetes mellitus ([place]) 07/10/2015  . Hormone replacement therapy (postmenopausal) 10/04/2014  . Mitral regurgitation 11/05/2012  . Aortic valve disease 10/21/2011  . Menorrhagia, premenopausal 09/01/2011  . Depression 03/04/2010  . GOUT 12/16/2006  . ANEMIA, IRON DEFICIENCY NOS 12/16/2006  . TOBACCO ABUSE 12/16/2006  . Essential hypertension 12/16/2006    Social History   Tobacco Use  . Smoking status: Current Every Day Smoker    Packs/day: 0.50    Years: 7.00    Pack years: 3.50    Types: Cigarettes  . Smokeless tobacco: Never Used  Substance Use Topics  . Alcohol use: No     Current Outpatient Medications:  .  amLODipine (NORVASC) 10 MG  tablet, Take 1 tablet (10 mg total) by mouth daily., Disp: 90 tablet, Rfl: 1 .  brimonidine-timolol (COMBIGAN) 0.2-0.5 % ophthalmic solution, Place 1 drop into both eyes every 12 (twelve) hours., Disp: , Rfl:  .  estradiol (ESTRACE) 1 MG tablet, Take 1 tablet (1 mg total) by mouth daily., Disp: 30 tablet, Rfl: 5 .  glucose blood (ACCU-CHEK AVIVA) test strip, Use as instructed, Disp: 50 each, Rfl: 2 .  hydrochlorothiazide 25 MG tablet, Take 25 mg by mouth daily.  , Disp: , Rfl:  .  hydrOXYzine (ATARAX/VISTARIL) 50 MG tablet, Take 50 mg by mouth 3 (three) times daily as needed for itching., Disp: , Rfl:  .  Lancets (ACCU-CHEK SOFT TOUCH) lancets, Use as instructed, Disp: 100 each, Rfl: 12 .  losartan (COZAAR) 100 MG tablet, Take 1 tablet (100 mg total) by mouth daily., Disp: 30 tablet, Rfl: 0 .  medroxyPROGESTERone (PROVERA) 5 MG tablet, Take 1 tablet (5 mg total) by mouth daily., Disp: 30 tablet, Rfl: 5 .  metFORMIN (GLUCOPHAGE) 500 MG tablet, Take 1 tablet (500 mg total) by mouth daily., Disp: 90 tablet, Rfl: 0 .  methocarbamol (ROBAXIN) 500 MG tablet, Take 1 tablet (500 mg total) by mouth 2 (two) times daily., Disp: 60 tablet, Rfl: 0 .  oxybutynin (DITROPAN-XL) 5 MG 24 hr tablet, Take 1 tablet (5 mg total) by mouth at bedtime., Disp: 30 tablet, Rfl: 6 .  QUEtiapine (SEROQUEL) 50 MG tablet, Take 50 mg by mouth at bedtime., Disp: , Rfl:  .  timolol (BETIMOL) 0.25 % ophthalmic solution, Apply 1 drop 2 (two) times daily to eye., Disp: 10 mL, Rfl: 3 .  traMADol (ULTRAM) 50 MG tablet, Take 1 tablet (50 mg total) by mouth every 6 (six) hours as needed., Disp: 60 tablet, Rfl: 0 .  venlafaxine (EFFEXOR) 75 MG tablet, Take 75 mg by mouth 3 (three) times daily with meals., Disp: , Rfl:   No Known Allergies  ROS See HPI  Objective  Vitals:   09/11/17 1359  BP: 140/80  Pulse: 77  Resp: 16  Temp: 98.6 F (37 C)  TempSrc: Oral  SpO2: 97%  Weight: 224 lb (101.6 kg)  Height: '5\' 7"'  (1.702 m)     Body mass index is 35.08 kg/m.  Nursing Note and Vital Signs reviewed.  Physical Exam Constitutional: Patient appears well-developed and well-nourished. No distress.  HEENT: head atraumatic, normocephalic, pupils equal and reactive to light, EOM's intact, TM's without erythema or bulging, no maxillary or frontal sinus tenderness , dental caries noted, neck supple with tenderness to anterior neck, oropharynx pink and moist without exudate. No cervical adenopathy. No obvious swelling. Cardiovascular: Normal rate, regular rhythm. No BLE edema. Pulmonary/Chest: Effort normal. No respiratory distress or retractions. Skin: Warm and dry. No erythema or bruising. No rash. Psychiatric: Patient has a normal mood and affect.    Assessment & Plan RTC in 1 week for follow up of acute throat pain   1. Sore throat - POCT rapid strep A-negative   2. Throat pain -Red flags and when to present for emergency care or RTC including fever >101.38F, chest pain, shortness of breath, new/worsening/un-resolving symptoms,  reviewed with patient at time of visit. Follow up and care instructions discussed and provided in AVS. Consider CT scan  if no improvement - CBC; Future - Sed Rate (ESR); Future - cefdinir (OMNICEF) 300 MG capsule; Take 1 capsule (300 mg total) by mouth 2 (two) times daily.  Dispense: 20 capsule; Refill: 0

## 2017-09-16 ENCOUNTER — Ambulatory Visit (INDEPENDENT_AMBULATORY_CARE_PROVIDER_SITE_OTHER): Admitting: Internal Medicine

## 2017-09-16 ENCOUNTER — Other Ambulatory Visit (INDEPENDENT_AMBULATORY_CARE_PROVIDER_SITE_OTHER)

## 2017-09-16 ENCOUNTER — Telehealth: Payer: Self-pay

## 2017-09-16 ENCOUNTER — Other Ambulatory Visit: Payer: Self-pay | Admitting: Internal Medicine

## 2017-09-16 ENCOUNTER — Encounter: Payer: Self-pay | Admitting: Internal Medicine

## 2017-09-16 VITALS — BP 134/86 | HR 81 | Temp 98.3°F | Ht 67.0 in | Wt 224.0 lb

## 2017-09-16 DIAGNOSIS — D509 Iron deficiency anemia, unspecified: Secondary | ICD-10-CM | POA: Diagnosis not present

## 2017-09-16 DIAGNOSIS — N924 Excessive bleeding in the premenopausal period: Secondary | ICD-10-CM

## 2017-09-16 DIAGNOSIS — E119 Type 2 diabetes mellitus without complications: Secondary | ICD-10-CM

## 2017-09-16 DIAGNOSIS — E559 Vitamin D deficiency, unspecified: Secondary | ICD-10-CM | POA: Diagnosis not present

## 2017-09-16 DIAGNOSIS — R7989 Other specified abnormal findings of blood chemistry: Secondary | ICD-10-CM

## 2017-09-16 DIAGNOSIS — J029 Acute pharyngitis, unspecified: Secondary | ICD-10-CM | POA: Diagnosis not present

## 2017-09-16 LAB — TSH: TSH: 0.55 u[IU]/mL (ref 0.35–4.50)

## 2017-09-16 LAB — IBC PANEL
Iron: 72 ug/dL (ref 42–145)
Saturation Ratios: 22.2 % (ref 20.0–50.0)
Transferrin: 232 mg/dL (ref 212.0–360.0)

## 2017-09-16 LAB — T4, FREE: Free T4: 0.68 ng/dL (ref 0.60–1.60)

## 2017-09-16 LAB — HEMOGLOBIN A1C: Hgb A1c MFr Bld: 6.7 % — ABNORMAL HIGH (ref 4.6–6.5)

## 2017-09-16 LAB — VITAMIN D 25 HYDROXY (VIT D DEFICIENCY, FRACTURES): VITD: 24.64 ng/mL — ABNORMAL LOW (ref 30.00–100.00)

## 2017-09-16 MED ORDER — VITAMIN D (ERGOCALCIFEROL) 1.25 MG (50000 UNIT) PO CAPS: 50000.0000 [IU] | ORAL_CAPSULE | ORAL | 0 refills | Status: DC

## 2017-09-16 NOTE — Assessment & Plan Note (Signed)
Pt requests f/u

## 2017-09-16 NOTE — Patient Instructions (Signed)
OK to continue your current antibiotic treatment, and no CT scan is needed today  Please continue all other medications as before, and refills have been done if requested.  Please have the pharmacy call with any other refills you may need.  Please continue your efforts at being more active, low cholesterol diet, and weight control.  Please keep your appointments with your specialists as you may have planned  Please go to the LAB in the Basement (turn left off the elevator) for the tests to be done today  You will be contacted by phone if any changes need to be made immediately.  Otherwise, you will receive a letter about your results with an explanation, but please check with MyChart first.  Please remember to sign up for MyChart if you have not done so, as this will be important to you in the future with finding out test results, communicating by private email, and scheduling acute appointments online when needed.  Please return in 6 months, or sooner if needed, to your PCP

## 2017-09-16 NOTE — Assessment & Plan Note (Signed)
No longer with menorrhagia per pt on HRT; will need f/u iron, if neg would consider GI evaluation

## 2017-09-16 NOTE — Assessment & Plan Note (Signed)
None for 3 yrs per pt on current tx. Plans to f/u with GYN regarding contd HRT

## 2017-09-16 NOTE — Telephone Encounter (Signed)
-----   Message from Corwin LevinsJames W John, MD sent at 09/16/2017 12:58 PM EDT ----- Left message on MyChart, pt to cont same tx except  The test results show that your current treatment is OK, as the only problem found was Low Vitamin D.  Please take Vit D 50000 units per wk for 12 wks only, then change to OTC Vit D 2000 units per day.  I will send a prescription but you should hear from the office as well    Constantine Ruddick to please inform pt, I will do rx

## 2017-09-16 NOTE — Assessment & Plan Note (Signed)
Past lab review shows TSH markedly abnormal in 2009 without specific hx thyroid dz or further evaluation or tx; for f/u TFT's today, further pending results

## 2017-09-16 NOTE — Assessment & Plan Note (Signed)
Due for a1c - to add to labs tday

## 2017-09-16 NOTE — Assessment & Plan Note (Signed)
Improved overall, to cont current tx/antibiotic, no need for CT to r/o abscess today

## 2017-09-16 NOTE — Telephone Encounter (Signed)
Pt has been informed and expressed understanding.  

## 2017-09-16 NOTE — Progress Notes (Signed)
Subjective:    Patient ID: Stephanie Joseph, female    DOB: Nov 19, 1960, 57 y.o.   MRN: [id number]  HPI  Here to f/u as requested for acute pharyngitis apparently with some concern for evolving pharyngeal abscess, to see if CT needed today.  Does have 8 days acute onset fever, severe ST pain (now much improved), pressure, headache, general weakness and malaise, and minor scant prod cough, but pt denies chest pain, wheezing, increased sob or doe, orthopnea, PND, increased LE swelling, palpitations, dizziness or syncope.  Denies hyper or hypo thyroid symptoms such as voice, skin or hair change.  Has hx of iron def anemia related to menorrhagia now controlled for last 3 yrs per pt with  HRT tx per GYN.  Did have some mild low hgb with last labs.  No other overt bleeding.   Pt denies polydipsia, polyuria  No other new complaints or interval hx Past Medical History:  Diagnosis Date  . Anemia, iron deficiency   . Arthritis   . Depression   . Diabetes mellitus without complication ([place])   . Dysmenorrhea   . Gout   . Heart valve problem   . Hypertension   . Menorrhagia   . Mitral regurgitation   . Pneumonia    Past Surgical History:  Procedure Laterality Date  . KNEE ARTHROSCOPY     Left    reports that she has been smoking cigarettes.  She has a 3.50 pack-year smoking history. she has never used smokeless tobacco. She reports that she does not drink alcohol or use drugs. family history includes Heart disease in her brother and sister; Hypertension in her mother; Stroke in her paternal grandmother. No Known Allergies Current Outpatient Medications on File Prior to Visit  Medication Sig Dispense Refill  . amLODipine (NORVASC) 10 MG tablet Take 1 tablet (10 mg total) by mouth daily. 90 tablet 1  . brimonidine-timolol (COMBIGAN) 0.2-0.5 % ophthalmic solution Place 1 drop into both eyes every 12 (twelve) hours.    . cefdinir (OMNICEF) 300 MG capsule Take 1 capsule (300 mg total) by mouth 2 (two)  times daily. 20 capsule 0  . estradiol (ESTRACE) 1 MG tablet Take 1 tablet (1 mg total) by mouth daily. 30 tablet 5  . glucose blood (ACCU-CHEK AVIVA) test strip Use as instructed 50 each 2  . hydrochlorothiazide 25 MG tablet Take 25 mg by mouth daily.      . hydrOXYzine (ATARAX/VISTARIL) 50 MG tablet Take 50 mg by mouth 3 (three) times daily as needed for itching.    . Lancets (ACCU-CHEK SOFT TOUCH) lancets Use as instructed 100 each 12  . losartan (COZAAR) 100 MG tablet Take 1 tablet (100 mg total) by mouth daily. 30 tablet 0  . medroxyPROGESTERone (PROVERA) 5 MG tablet Take 1 tablet (5 mg total) by mouth daily. 30 tablet 5  . metFORMIN (GLUCOPHAGE) 500 MG tablet Take 1 tablet (500 mg total) by mouth daily. 90 tablet 0  . methocarbamol (ROBAXIN) 500 MG tablet Take 1 tablet (500 mg total) by mouth 2 (two) times daily. 60 tablet 0  . oxybutynin (DITROPAN-XL) 5 MG 24 hr tablet Take 1 tablet (5 mg total) by mouth at bedtime. 30 tablet 6  . QUEtiapine (SEROQUEL) 50 MG tablet Take 50 mg by mouth at bedtime.    . timolol (BETIMOL) 0.25 % ophthalmic solution Apply 1 drop 2 (two) times daily to eye. 10 mL 3  . traMADol (ULTRAM) 50 MG tablet Take 1 tablet (50 mg total)  by mouth every 6 (six) hours as needed. 60 tablet 0  . venlafaxine (EFFEXOR) 75 MG tablet Take 75 mg by mouth 3 (three) times daily with meals.     No current facility-administered medications on file prior to visit.    Review of Systems  Constitutional: Negative for other unusual diaphoresis or sweats HENT: Negative for ear discharge or swelling Eyes: Negative for other worsening visual disturbances Respiratory: Negative for stridor or other swelling  Gastrointestinal: Negative for worsening distension or other blood Genitourinary: Negative for retention or other urinary change Musculoskeletal: Negative for other MSK pain or swelling Skin: Negative for color change or other new lesions Neurological: Negative for worsening tremors  and other numbness  Psychiatric/Behavioral: Negative for worsening agitation or other fatigue All other system neg per pt    Objective:   Physical Exam BP 134/86   Pulse 81   Temp 98.3 F (36.8 C) (Oral)   Ht _0  (1.702 m)   Wt 224 lb (101.6 kg)   LMP 02/13/2014 (Within Days)   SpO2 98%   BMI 35.08 kg/m  VS noted, mild ill Constitutional: Pt appears in NAD HENT: Head: NCAT.  Right Ear: External ear normal.  Left Ear: External ear normal.  Eyes: . Pupils are equal, round, and reactive to light. Conjunctivae and EOM are normal Nose: without d/c or deformity Bilat tm's with mild erythema.  Max sinus areas non tender.  Pharynx with mild erythema, no exudate Neck: Neck supple. Gross normal ROM Cardiovascular: Normal rate and regular rhythm.   Pulmonary/Chest: Effort normal and breath sounds without rales or wheezing.  Abd:  Soft, NT, ND, + BS, no organomegaly Neurological: Pt is alert. At baseline orientation, motor grossly intact Skin: Skin is warm. No rashes, other new lesions, no LE edema Psychiatric: Pt behavior is normal without agitation  No other exam findings  Lab Results  Component Value Date   WBC 5.8 09/11/2017   HGB 11.8 (L) 09/11/2017   HCT 36.2 09/11/2017   MCV 83.5 09/11/2017   PLT 281.0 09/11/2017  Sed Rate (ESR)   Ref Range & Units 5d ago  Sed Rate 0 - 30 mm/hr 33 Abnormally high       Hemoglobin A1c  Order: [id number]  Status:  Final result Visible to patient:  Yes (MyChart) Next appt:  10/29/2017 at 10:30 AM in Internal Medicine Lance Sell, NP) Dx:  Routine adult health maintenance  Important Suggestion Newer results are available. Click to view them now.   Ref Range & Units 42moago 134yrgo 2y109yro  Hgb A1c MFr Bld 4.6 - 6.5 % 6.4  6.1 CM 5.8 CM       TSH 0.162 (sept 26, 2009)     Assessment & Plan:

## 2017-09-16 NOTE — Progress Notes (Signed)
   Subjective:    Patient ID: Stephanie Joseph, female    DOB: 02/08/1961, 57 y.o.   MRN: [id number]  HPI    Review of Systems     Objective:   Physical Exam        Assessment & Plan:

## 2017-10-02 ENCOUNTER — Ambulatory Visit: Admitting: Family

## 2017-10-02 DIAGNOSIS — Z0289 Encounter for other administrative examinations: Secondary | ICD-10-CM

## 2017-10-09 ENCOUNTER — Telehealth: Payer: Self-pay | Admitting: Nurse Practitioner

## 2017-10-09 NOTE — Telephone Encounter (Signed)
Looking at her medication list, her OB-GYN filled the Provera in January 2019 with 6 refills; she should be able to get this refilled; If she wants Stephanie Joseph to start managing, she needs to schedule a follow-up to discuss; very odd that GYN won't refill medication he started and has already refilled this year?

## 2017-10-09 NOTE — Telephone Encounter (Signed)
Called pt no answer left Vernona RiegerLaura response on pt personal vm.Marland Kitchen.Raechel Chute/lmb

## 2017-10-09 NOTE — Telephone Encounter (Signed)
Call to patient- patient states Dr Debroah LoopArnold told her that her PCP could manage her HRT. Patient states she does not have any bleeding taking the estrogen and progestin together. Reminded patient that she is not to take the estrogen by itself- she has to have the progestin with it. She has 4 pills left. She needs a refill on her provera 5 mg. She would like A.Shambley to refill that- she has an appointment with her later this month. Told patient I would forward her request.

## 2017-10-09 NOTE — Telephone Encounter (Signed)
[name] out of the office pls advise../lmb 

## 2017-10-09 NOTE — Telephone Encounter (Signed)
Copied from CRM [id number]. Topic: Quick Communication - See Telephone Encounter >> Oct 09, 2017  8:57 AM Windy KalataMichael, Remedy Corporan L, NT wrote: CRM for notification. See Telephone encounter for: 10/09/17.  Patient is calling and states that her OBGYN doctor, Dr. Debroah LoopArnold, told her to call her PCP to get a refill on medroxyPROGESTERone (PROVERA) 5 MG tablet even though he was the last to refill. Advised patient she may have to be seen first. Please advise.  [facility] [address] [place], [place] - [address] [place] [address] Phone: [phone] Fax: [phone]

## 2017-10-10 ENCOUNTER — Telehealth: Payer: Self-pay | Admitting: Nurse Practitioner

## 2017-10-10 ENCOUNTER — Ambulatory Visit (INDEPENDENT_AMBULATORY_CARE_PROVIDER_SITE_OTHER): Admitting: Family Medicine

## 2017-10-10 ENCOUNTER — Encounter: Payer: Self-pay | Admitting: Family Medicine

## 2017-10-10 VITALS — BP 136/64 | HR 67 | Temp 98.7°F | Wt 226.0 lb

## 2017-10-10 DIAGNOSIS — M545 Low back pain, unspecified: Secondary | ICD-10-CM

## 2017-10-10 MED ORDER — PREDNISONE 10 MG PO TABS: ORAL_TABLET | ORAL | 0 refills | Status: DC

## 2017-10-10 NOTE — Patient Instructions (Signed)
Follow up as needed or as scheduled START the Prednisone as directed- 3 at the same time for 3 days and so on (take w/ food) CONTINUE the Tramadol and Robaxin as directed Ice or heat- whichever is more comfortable If no improvement, please call and let us know Call with any questions or concerns Hang in there!

## 2017-10-10 NOTE — Progress Notes (Signed)
   Subjective:    Patient ID: Stephanie Joseph, female    DOB: 10/02/1960, 57 y.o.   MRN: [id number]  HPI Back pain- sxs started yesterday, 'I don't know what happened'.  When pt tried to get up from her nap, 'I couldn't get up'.  Pain is in the center.  No radiation of pain.  Pain is in the lower back.  No changes to bowel or bladder habits.  On Methocarbamol and Tramadol w/o relief.  Pain is worse today than yesterday.  No numbness or tingling.   Review of Systems For ROS see HPI     Objective:   Physical Exam  Constitutional: She is oriented to person, place, and time. She appears well-developed and well-nourished.  Obviously uncomfortable  Musculoskeletal: She exhibits tenderness (TTP over upper lumbar spine). She exhibits no edema or deformity.  Neurological: She is alert and oriented to person, place, and time.  + SLR on L, (-) SLR on R Ambulating w/ a cane  Skin: Skin is warm and dry. No rash noted. No erythema.  Vitals reviewed.         Assessment & Plan:  LBP- new.  Pt awoke from a nap yesterday w/ acute LBP.  No radiation of pain, no changes to bowel or bladder habits, no numbness or tingling.  No relief w/ Robaxin or Tramadol.  Give pain, will add Pred taper.  Cautioned her to watch her sugars given her diabetes (although well controlled w/ A1C of 6.7).  If no improvement, pt to call PCP for additional work up.  Pt expressed understanding and is in agreement w/ plan.

## 2017-10-10 NOTE — Telephone Encounter (Signed)
Patient called [facility] stating she had back pain.  [facility] suggest patient to be seen during [facility].  Patient has been scheduled.

## 2017-10-16 ENCOUNTER — Encounter: Payer: Self-pay | Admitting: Family

## 2017-10-16 ENCOUNTER — Other Ambulatory Visit: Payer: Self-pay | Admitting: Family

## 2017-10-16 ENCOUNTER — Telehealth: Payer: Self-pay | Admitting: Nurse Practitioner

## 2017-10-16 ENCOUNTER — Ambulatory Visit (INDEPENDENT_AMBULATORY_CARE_PROVIDER_SITE_OTHER): Admitting: Family

## 2017-10-16 ENCOUNTER — Ambulatory Visit (INDEPENDENT_AMBULATORY_CARE_PROVIDER_SITE_OTHER)
Admission: RE | Admit: 2017-10-16 | Discharge: 2017-10-16 | Disposition: A | Discharge status: Home / Self Care | Source: Ambulatory Visit | Attending: Family | Admitting: Family

## 2017-10-16 DIAGNOSIS — M545 Low back pain: Secondary | ICD-10-CM

## 2017-10-16 DIAGNOSIS — G8929 Other chronic pain: Secondary | ICD-10-CM

## 2017-10-16 DIAGNOSIS — M51369 Other intervertebral disc degeneration, lumbar region without mention of lumbar back pain or lower extremity pain: Secondary | ICD-10-CM

## 2017-10-16 DIAGNOSIS — M5136 Other intervertebral disc degeneration, lumbar region: Secondary | ICD-10-CM

## 2017-10-16 MED ORDER — TRAMADOL HCL 50 MG PO TABS: 50.0000 mg | ORAL_TABLET | Freq: Four times a day (QID) | ORAL | 0 refills | Status: DC | PRN

## 2017-10-16 MED ORDER — METHOCARBAMOL 500 MG PO TABS: 500.0000 mg | ORAL_TABLET | Freq: Three times a day (TID) | ORAL | 0 refills | Status: DC | PRN

## 2017-10-16 NOTE — Telephone Encounter (Signed)
Routing to provider  

## 2017-10-16 NOTE — Telephone Encounter (Signed)
Codes given

## 2017-10-16 NOTE — Progress Notes (Signed)
Stephanie Joseph is a 57 y.o. female with the following history as recorded in EpicCare:  Patient Active Problem List   Diagnosis Date Noted  . Acute pharyngitis 09/16/2017  . Abnormal TSH 09/16/2017  . Glaucoma 05/21/2017  . Tooth pain 04/28/2017  . Routine adult health maintenance 09/19/2016  . Class 1 obesity due to excess calories with serious comorbidity and body mass index (BMI) of 33.0 to 33.9 in adult 09/19/2016  . Vitamin D deficiency 09/19/2016  . Urinary frequency 02/14/2016  . Back pain 10/18/2015  . Type 2 diabetes mellitus ([facility]CC) 07/10/2015  . Hormone replacement therapy (postmenopausal) 10/04/2014  . Mitral regurgitation 11/05/2012  . Aortic valve disease 10/21/2011  . Menorrhagia, premenopausal 09/01/2011  . Depression 03/04/2010  . GOUT 12/16/2006  . ANEMIA, IRON DEFICIENCY NOS 12/16/2006  . TOBACCO ABUSE 12/16/2006  . Essential hypertension 12/16/2006    Current Outpatient Medications  Medication Sig Dispense Refill  . amLODipine (NORVASC) 10 MG tablet Take 1 tablet (10 mg total) by mouth daily. 90 tablet 1  . brimonidine-timolol (COMBIGAN) 0.2-0.5 % ophthalmic solution Place 1 drop into both eyes every 12 (twelve) hours.    Marland Kitchen estradiol (ESTRACE) 1 MG tablet Take 1 tablet (1 mg total) by mouth daily. 30 tablet 5  . glucose blood (ACCU-CHEK AVIVA) test strip Use as instructed 50 each 2  . hydrochlorothiazide 25 MG tablet Take 25 mg by mouth daily.      . hydrOXYzine (ATARAX/VISTARIL) 50 MG tablet Take 50 mg by mouth 3 (three) times daily as needed for itching.    . Lancets (ACCU-CHEK SOFT TOUCH) lancets Use as instructed 100 each 12  . losartan (COZAAR) 100 MG tablet Take 1 tablet (100 mg total) by mouth daily. 30 tablet 0  . medroxyPROGESTERone (PROVERA) 5 MG tablet Take 1 tablet (5 mg total) by mouth daily. 30 tablet 5  . metFORMIN (GLUCOPHAGE) 500 MG tablet Take 1 tablet (500 mg total) by mouth daily. 90 tablet 0  . methocarbamol (ROBAXIN) 500 MG tablet Take 1  tablet (500 mg total) by mouth every 8 (eight) hours as needed for muscle spasms. 60 tablet 0  . oxybutynin (DITROPAN-XL) 5 MG 24 hr tablet Take 1 tablet (5 mg total) by mouth at bedtime. 30 tablet 6  . predniSONE (DELTASONE) 10 MG tablet 3 tabs x3 days and then 2 tabs x3 days and then 1 tab x3 days.  Take w/ food. 18 tablet 0  . QUEtiapine (SEROQUEL) 50 MG tablet Take 50 mg by mouth at bedtime.    . timolol (BETIMOL) 0.25 % ophthalmic solution Apply 1 drop 2 (two) times daily to eye. 10 mL 3  . timolol (TIMOPTIC) 0.5 % ophthalmic solution   0  . traMADol (ULTRAM) 50 MG tablet Take 1 tablet (50 mg total) by mouth every 6 (six) hours as needed. 60 tablet 0  . venlafaxine (EFFEXOR) 75 MG tablet Take 75 mg by mouth 3 (three) times daily with meals.    . Vitamin D, Ergocalciferol, (DRISDOL) 50000 units CAPS capsule Take 1 capsule (50,000 Units total) by mouth every 7 (seven) days. 12 capsule 0   No current facility-administered medications for this visit.     Allergies: Patient has no known allergies.  Past Medical History:  Diagnosis Date  . Anemia, iron deficiency   . Arthritis   . Depression   . Diabetes mellitus without complication ([facility])   . Dysmenorrhea   . Gout   . Heart valve problem   . Hypertension   .  Menorrhagia   . Mitral regurgitation   . Pneumonia     Past Surgical History:  Procedure Laterality Date  . KNEE ARTHROSCOPY     Left    Family History  Problem Relation Age of Onset  . Hypertension Mother   . Stroke Paternal Grandmother   . Heart disease Sister   . Heart disease Brother     Social History   Tobacco Use  . Smoking status: Current Every Day Smoker    Packs/day: 0.50    Years: 7.00    Pack years: 3.50    Types: Cigarettes  . Smokeless tobacco: Never Used  Substance Use Topics  . 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  Neck: Supple without thyromegaly, adenopathy  Lungs: Respirations unlabored; clear to auscultation bilaterally without wheeze, rales, rhonchi  Musculoskeletal: No deformities; no active joint inflammation  Extremities: No edema, cyanosis, clubbing  Vessels: Symmetric bilaterally  Neurologic: Alert and oriented; speech intact; face symmetrical; moves all extremities well; CNII-XII intact without focal deficit; uses cane to help her walk Assessment:  1. Chronic midline low back pain, with sciatica presence unspecified     Plan:  Will update lumbar X-ray today; may need to consider MRI or referral to back specialist; she notes she cannot afford PT; could consider trial of Neurontin also; refills given on Robaxin and Tramadol; follow-up to be determined.   No follow-ups on file.  Orders Placed This Encounter  Procedures  . DG Lumbar Spine 2-3 Views    Standing Status:   Future    Number of Occurrences:   1    Standing Expiration Date:   12/17/2018    Order Specific Question:   Reason for Exam (SYMPTOM  OR DIAGNOSIS REQUIRED)    Answer:   low back pain    Order Specific Question:   Is patient pregnant?    Answer:   No    Order Specific Question:   Preferred imaging location?    Answer:   [facility] [address]    Order Specific Question:   Radiology Contrast Protocol - do NOT remove file path    Answer:    [email or web address]    Requested Prescriptions   Signed Prescriptions Disp Refills  . traMADol (ULTRAM) 50 MG tablet 60 tablet 0    Sig: Take 1 tablet (50 mg total) by mouth every 6 (six) hours as needed.  . methocarbamol (ROBAXIN) 500 MG tablet 60 tablet 0    Sig: Take 1 tablet (500 mg total) by mouth every 8 (eight) hours as needed for muscle spasms.

## 2017-10-16 NOTE — Telephone Encounter (Signed)
Copied from CRM [id number]. Topic: Quick Communication - See Telephone Encounter >> Oct 16, 2017  1:24 PM Cipriano BunkerLambe, Annette S wrote: CRM for notification.   Mindi JunkerMarsha from [facility] needing a diagnoses for the traMADol Janean Sark(ULTRAM) 50 MG tablet  [facility] [address] - [place], [place] - [address]  [address] [place] [address]  Phone: [phone] Fax: [phone]     See Telephone encounter for: 10/16/17.

## 2017-10-29 ENCOUNTER — Telehealth: Payer: Self-pay | Admitting: Nurse Practitioner

## 2017-10-29 ENCOUNTER — Encounter: Payer: Self-pay | Admitting: Nurse Practitioner

## 2017-10-29 ENCOUNTER — Encounter

## 2017-10-29 ENCOUNTER — Other Ambulatory Visit (INDEPENDENT_AMBULATORY_CARE_PROVIDER_SITE_OTHER)

## 2017-10-29 ENCOUNTER — Ambulatory Visit (INDEPENDENT_AMBULATORY_CARE_PROVIDER_SITE_OTHER): Admitting: Nurse Practitioner

## 2017-10-29 VITALS — BP 154/80 | HR 89 | Temp 98.9°F | Resp 16 | Ht 67.0 in | Wt 226.0 lb

## 2017-10-29 DIAGNOSIS — G8929 Other chronic pain: Secondary | ICD-10-CM

## 2017-10-29 DIAGNOSIS — I10 Essential (primary) hypertension: Secondary | ICD-10-CM

## 2017-10-29 DIAGNOSIS — E119 Type 2 diabetes mellitus without complications: Secondary | ICD-10-CM

## 2017-10-29 DIAGNOSIS — M545 Low back pain: Secondary | ICD-10-CM | POA: Diagnosis not present

## 2017-10-29 LAB — COMPREHENSIVE METABOLIC PANEL
ALT: 10 U/L (ref 0–35)
AST: 9 U/L (ref 0–37)
Albumin: 3.9 g/dL (ref 3.5–5.2)
Alkaline Phosphatase: 78 U/L (ref 39–117)
BUN: 9 mg/dL (ref 6–23)
CO2: 28 mEq/L (ref 19–32)
Calcium: 9 mg/dL (ref 8.4–10.5)
Chloride: 106 mEq/L (ref 96–112)
Creatinine, Ser: 0.8 mg/dL (ref 0.40–1.20)
GFR: 95.08 mL/min (ref >60.00)
Glucose, Bld: 193 mg/dL — ABNORMAL HIGH (ref 70–99)
Potassium: 4.1 mEq/L (ref 3.5–5.1)
Sodium: 140 mEq/L (ref 135–145)
Total Bilirubin: 0.3 mg/dL (ref 0.2–1.2)
Total Protein: 7.2 g/dL (ref 6.0–8.3)

## 2017-10-29 MED ORDER — LOSARTAN POTASSIUM 100 MG PO TABS: 100.0000 mg | ORAL_TABLET | Freq: Every day | ORAL | 0 refills | Status: DC

## 2017-10-29 MED ORDER — METHOCARBAMOL 500 MG PO TABS: 500.0000 mg | ORAL_TABLET | Freq: Three times a day (TID) | ORAL | 0 refills | Status: DC | PRN

## 2017-10-29 MED ORDER — AMLODIPINE BESYLATE 10 MG PO TABS: 10.0000 mg | ORAL_TABLET | Freq: Every day | ORAL | 0 refills | Status: DC

## 2017-10-29 NOTE — Assessment & Plan Note (Signed)
A1c ordered on 09/16/17 shows stable A1c with patient off metformin We will hold her metformin for now and F/U in 3 months for repeat A1c or sooner for glucose readings over 160 at home Also Discussed the role of healthy diet in the management of diabetes - Comprehensive metabolic panel; Future - HM DIABETES FOOT EXAM; Future

## 2017-10-29 NOTE — Telephone Encounter (Signed)
Yes- [facility]. I sent the refill myself. I only sent 30 day supply as this is a large quantity and her treatment may change after her upcoming MRI Please apologize I told her that I would send it today and I thought you said it was sent- let her know that it has been sent by me and [facility].

## 2017-10-29 NOTE — Progress Notes (Signed)
Name: Stephanie Joseph   MRN: [id number]    DOB: 1960-10-25   Date:10/29/2017       Progress Note  Subjective  Chief Complaint  Chief Complaint  Patient presents with  . Follow-up    3 month-HTN and DM    HPI  Hypertension -maintained on amlodipine 10, losartan 100 daily. Reports daily medication compliance without adverse medication effects. Reports checking her blood pressure 2-3 times a week with recent readings 120s-130s/70s. Denies headaches, vision changes, chest pain, shortness of breath, edema. She tries to watch her diet and reduced her sodium.  BP Readings from Last 3 Encounters:  10/29/17 (!) 154/80  10/16/17 (!) 142/84  10/10/17 136/64   Diabetes- she has been maintained on metformin 500 daily but tells me today that she actually has been taking her metformin only about one time per month due to it making her feel nauseated. She says she has only been taking one dose per month for quite some time now, probably at least half a year. Reports she checks her glucose daily at home with readings normally between 120-150, occassionally about once a month she will get a high reading of around 200 if she eats a lot Denies tremor, diaphoresis, polyuria, polydipsia, polyphagia.  Lab Results  Component Value Date   HGBA1C 6.7 (H) 09/16/2017   Back pain- Currently maintained on tramadol and robaxin q6 h prn for back pain with some relief of the pain She has been seen by another provider in our clinic for this with upcoming MRI scheduled next week She does Continue to have daily back pain radiating down legs and trouble with normal ADLs due to pain. She denies bowel or bladder changes   Patient Active Problem List   Diagnosis Date Noted  . Acute pharyngitis 09/16/2017  . Abnormal TSH 09/16/2017  . Glaucoma 05/21/2017  . Tooth pain 04/28/2017  . Routine adult health maintenance 09/19/2016  . Class 1 obesity due to excess calories with serious comorbidity and body mass index  (BMI) of 33.0 to 33.9 in adult 09/19/2016  . Vitamin D deficiency 09/19/2016  . Urinary frequency 02/14/2016  . Back pain 10/18/2015  . Type 2 diabetes mellitus (HCC) 07/10/2015  . Hormone replacement therapy (postmenopausal) 10/04/2014  . Mitral regurgitation 11/05/2012  . Aortic valve disease 10/21/2011  . Menorrhagia, premenopausal 09/01/2011  . Depression 03/04/2010  . GOUT 12/16/2006  . ANEMIA, IRON DEFICIENCY NOS 12/16/2006  . TOBACCO ABUSE 12/16/2006  . Essential hypertension 12/16/2006    Past Surgical History:  Procedure Laterality Date  . KNEE ARTHROSCOPY     Left    Family History  Problem Relation Age of Onset  . Hypertension Mother   . Stroke Paternal Grandmother   . Heart disease Sister   . Heart disease Brother     Social History   Socioeconomic History  . Marital status: Married    Spouse name: Not on file  . Number of children: 1  . Years of education: 100  . Highest education level: Not on file  Occupational History  . Occupation: Disability Pending  Social Needs  . Financial resource strain: Not on file  . Food insecurity:    Worry: Not on file    Inability: Not on file  . Transportation needs:    Medical: Not on file    Non-medical: Not on file  Tobacco Use  . Smoking status: Current Every Day Smoker    Packs/day: 0.50    Years: 7.00    Pack  years: 3.50    Types: Cigarettes  . Smokeless tobacco: Never Used  Substance and Sexual Activity  . Alcohol use: No  . Drug use: No    Types: "Crack" cocaine  . Sexual activity: Yes    Birth control/protection: None  Lifestyle  . Physical activity:    Days per week: Not on file    Minutes per session: Not on file  . Stress: Not on file  Relationships  . Social connections:    Talks on phone: Not on file    Gets together: Not on file    Attends religious service: Not on file    Active member of club or organization: Not on file    Attends meetings of clubs or organizations: Not on file     Relationship status: Not on file  . Intimate partner violence:    Fear of current or ex partner: Not on file    Emotionally abused: Not on file    Physically abused: Not on file    Forced sexual activity: Not on file  Other Topics Concern  . Not on file  Social History Narrative   Fun: Go to church   Denies abuse and feels safe at home.      Current Outpatient Medications:  .  amLODipine (NORVASC) 10 MG tablet, Take 1 tablet (10 mg total) by mouth daily., Disp: 90 tablet, Rfl: 0 .  brimonidine-timolol (COMBIGAN) 0.2-0.5 % ophthalmic solution, Place 1 drop into both eyes every 12 (twelve) hours., Disp: , Rfl:  .  estradiol (ESTRACE) 1 MG tablet, Take 1 tablet (1 mg total) by mouth daily., Disp: 30 tablet, Rfl: 5 .  glucose blood (ACCU-CHEK AVIVA) test strip, Use as instructed, Disp: 50 each, Rfl: 2 .  hydrOXYzine (ATARAX/VISTARIL) 50 MG tablet, Take 50 mg by mouth 3 (three) times daily as needed for itching., Disp: , Rfl:  .  Lancets (ACCU-CHEK SOFT TOUCH) lancets, Use as instructed, Disp: 100 each, Rfl: 12 .  losartan (COZAAR) 100 MG tablet, Take 1 tablet (100 mg total) by mouth daily., Disp: 90 tablet, Rfl: 0 .  medroxyPROGESTERone (PROVERA) 5 MG tablet, Take 1 tablet (5 mg total) by mouth daily., Disp: 30 tablet, Rfl: 5 .  metFORMIN (GLUCOPHAGE) 500 MG tablet, Take 1 tablet (500 mg total) by mouth daily., Disp: 90 tablet, Rfl: 0 .  methocarbamol (ROBAXIN) 500 MG tablet, Take 1 tablet (500 mg total) by mouth every 8 (eight) hours as needed for muscle spasms., Disp: 60 tablet, Rfl: 0 .  oxybutynin (DITROPAN-XL) 5 MG 24 hr tablet, Take 1 tablet (5 mg total) by mouth at bedtime., Disp: 30 tablet, Rfl: 6 .  predniSONE (DELTASONE) 10 MG tablet, 3 tabs x3 days and then 2 tabs x3 days and then 1 tab x3 days.  Take w/ food., Disp: 18 tablet, Rfl: 0 .  QUEtiapine (SEROQUEL) 50 MG tablet, Take 50 mg by mouth at bedtime., Disp: , Rfl:  .  timolol (BETIMOL) 0.25 % ophthalmic solution, Apply 1 drop 2  (two) times daily to eye., Disp: 10 mL, Rfl: 3 .  timolol (TIMOPTIC) 0.5 % ophthalmic solution, , Disp: , Rfl: 0 .  traMADol (ULTRAM) 50 MG tablet, Take 1 tablet (50 mg total) by mouth every 6 (six) hours as needed., Disp: 60 tablet, Rfl: 0 .  venlafaxine (EFFEXOR) 75 MG tablet, Take 75 mg by mouth 3 (three) times daily with meals., Disp: , Rfl:  .  Vitamin D, Ergocalciferol, (DRISDOL) 50000 units CAPS capsule, Take 1  capsule (50,000 Units total) by mouth every 7 (seven) days., Disp: 12 capsule, Rfl: 0  No Known Allergies   ROS See HPI  Objective  Vitals:   10/29/17 1017  BP: (!) 154/80  Pulse: 89  Resp: 16  Temp: 98.9 F (37.2 C)  TempSrc: Oral  SpO2: 99%  Weight: 226 lb (102.5 kg)  Height: 5\' 7"  (1.702 m)    Body mass index is 35.4 kg/m.  Physical Exam Vital signs reviewed. Constitutional: Patient appears well-developed and well-nourished. No distress.  HENT: Head: Normocephalic and atraumatic. Nose: Nose normal. Mouth/Throat: Oropharynx is clear and moist.   Eyes: Conjunctivae and EOM are normal. Pupils are equal, round, and reactive to light. No scleral icterus.  Neck: Normal range of motion. Neck supple.  Cardiovascular: Normal rate, regular rhythm and normal heart sounds.  No BLE edema. Distal pulses intact. Pulmonary/Chest: Effort normal and breath sounds normal. No respiratory distress. Musculoskeletal:  No gross deformities. Neurological: She is alert and oriented to person, place, and time.  Coordination, balance, strength, speech are normal. ambulatory with cane Skin: Skin is warm and dry. No rash noted. No erythema.  Psychiatric: Patient has a normal mood and affect. Behavior is normal. Judgment and thought content normal.  Diabetic Foot Exam - Simple   Simple Foot Form Visual Inspection No deformities, no ulcerations, no other skin breakdown bilaterally:  Yes Sensation Testing Intact to touch and monofilament testing bilaterally:  Yes Pulse  Check Posterior Tibialis and Dorsalis pulse intact bilaterally:  Yes Comments     Assessment & Plan RTC in 3 months for CPE, F/U: HTN, DM-recheck A1c  -Reviewed Health Maintenance: HM DIABETES FOOT EXAM; Future-done today Patient reports annual diabetic eye exam with ophthalmology- records requested today Patient declines pneumonia vaccination today  Chronic midline low back pain, with sciatica presence unspecified Continue robaxin and tramadol prn for now Keep upcoming MRI appointment F/u sooner for new, worsening symptoms

## 2017-10-29 NOTE — Telephone Encounter (Signed)
Are you ok with this. She has not gotten the rx from [facility].

## 2017-10-29 NOTE — Telephone Encounter (Signed)
Copied from CRM #[id number]. Topic: Quick [id number] - See Telephone Encounter >> Oct 29, 2017 11:36 AM Jolayne Hainesaylor, Brittany L wrote: CRM for notification. See Telephone encounter for: 10/29/17.  Patient said just left the office and that Alphonse Guildshleigh Shambley was suppose to send in a prescription for 90 days for methocarbamol (ROBAXIN) 500 MG tablet to [facility] - [place], [place] - [address]

## 2017-10-29 NOTE — Assessment & Plan Note (Signed)
BP reading elevated today with normal readings at home. She feels that her elevated blood pressure is related to current back pain She will continue medications at current dosages and F/u in 3 months or sooner for home readings >140/90 Also Discussed the role of healthy, low salt diet in the management of hypertension - Comprehensive metabolic panel; Future

## 2017-10-29 NOTE — Patient Instructions (Addendum)
Please try to check your blood pressure once daily or at least a few times a week, at the same time each day, and keep a log. Please let me know if you are getting readings over 140/90 at home  Please try to check your blood sugar once daily or at least a few times a week, at the same time each day, and keep a log. Please let me know if you are getting readings over 160 at home.    Please head downstairs for lab work today.  Please return in about 3 months for your annual physical.

## 2017-10-30 NOTE — Telephone Encounter (Signed)
Pt aware of response below.  

## 2017-10-31 ENCOUNTER — Ambulatory Visit
Admission: RE | Admit: 2017-10-31 | Discharge: 2017-10-31 | Disposition: A | Discharge status: Home / Self Care | Source: Ambulatory Visit | Attending: Family | Admitting: Family

## 2017-10-31 DIAGNOSIS — M5136 Other intervertebral disc degeneration, lumbar region: Secondary | ICD-10-CM

## 2017-11-02 ENCOUNTER — Encounter: Payer: Self-pay | Admitting: Nurse Practitioner

## 2017-11-02 ENCOUNTER — Ambulatory Visit (INDEPENDENT_AMBULATORY_CARE_PROVIDER_SITE_OTHER): Admitting: Nurse Practitioner

## 2017-11-02 ENCOUNTER — Other Ambulatory Visit: Payer: Self-pay | Admitting: Family

## 2017-11-02 ENCOUNTER — Other Ambulatory Visit: Payer: Self-pay

## 2017-11-02 VITALS — BP 150/84 | HR 82 | Temp 98.8°F | Resp 16 | Ht 67.0 in

## 2017-11-02 DIAGNOSIS — G8929 Other chronic pain: Secondary | ICD-10-CM

## 2017-11-02 DIAGNOSIS — M5440 Lumbago with sciatica, unspecified side: Principal | ICD-10-CM

## 2017-11-02 DIAGNOSIS — M25562 Pain in left knee: Secondary | ICD-10-CM | POA: Diagnosis not present

## 2017-11-02 DIAGNOSIS — M545 Low back pain, unspecified: Secondary | ICD-10-CM

## 2017-11-02 DIAGNOSIS — I10 Essential (primary) hypertension: Secondary | ICD-10-CM

## 2017-11-02 MED ORDER — GLUCOSE BLOOD VI STRP: ORAL_STRIP | 2 refills | Status: DC

## 2017-11-02 NOTE — Patient Instructions (Addendum)
I suspect your knee pain may be related to your back pain  We are going to send you to a specialist for your back pain.  You should hear about the referral within 7-10 days.  Please follow up if your knee pain continues after you have seen the back specialist.  Thanks for letting me take care of you today :)    Knee Pain, Adult Many things can cause knee pain. The pain often goes away on its own with time and rest. If the pain does not go away, tests may be done to find out what is causing the pain. Follow these instructions at home: Activity  Rest your knee.  Do not do things that cause pain.  Avoid activities where both feet leave the ground at the same time (high-impact activities). Examples are running, jumping rope, and doing jumping jacks. General instructions  Take medicines only as told by your doctor.  Raise (elevate) your knee when you are resting. Make sure your knee is higher than your heart.  Sleep with a pillow under your knee.  If told, put ice on the knee: ? Put ice in a plastic bag. ? Place a towel between your skin and the bag. ? Leave the ice on for 20 minutes, 2-3 times a day.  Ask your doctor if you should wear an elastic knee support.  Lose weight if you are overweight. Being overweight can make your knee hurt more.  Do not use any tobacco products. These include cigarettes, chewing tobacco, or electronic cigarettes. If you need help quitting, ask your doctor. Smoking may slow down healing. Contact a doctor if:  The pain does not stop.  The pain changes or gets worse.  You have a fever along with knee pain.  Your knee gives out or locks up.  Your knee swells, and becomes worse. Get help right away if:  Your knee feels warm.  You cannot move your knee.  You have very bad knee pain.  You have chest pain.  You have trouble breathing. Summary  Many things can cause knee pain. The pain often goes away on its own with time and  rest.  Avoid activities that put stress on your knee. These include running and jumping rope.  Get help right away if you cannot move your knee, or if your knee feels warm, or if you have trouble breathing. This information is not intended to replace advice given to you by your health care provider. Make sure you discuss any questions you have with your health care provider. Document Released: 09/19/2008 Document Revised: 06/17/2016 Document Reviewed: 06/17/2016 Elsevier Interactive Patient Education  2017 [employer].

## 2017-11-02 NOTE — Assessment & Plan Note (Signed)
BP remains elevated with presentation to clinic for acute pain We discussed F/U for blood pressure in 3 months at her last OV on 4/25, after evaluation for back pain

## 2017-11-02 NOTE — Progress Notes (Signed)
Name: Stephanie Joseph   MRN: [id number]    DOB: 1961/02/25   Date:11/02/2017       Progress Note  Subjective  Chief Complaint  Chief Complaint  Patient presents with  . Knee Pain    left knee pain off and on for a month     HPI  Left knee pain-  This is a chronic problem Complains of intermittent left knee pain for years. She describes the pain as a 'stiff' pain and says that her knee gets "locked up" and she feels like she might fall. She has not actually fallen. She has been using a cane to help her walk for the past month due back and knee pain She denies weakness, numbness, tingling, erythema, bruising. Her husband thought her knee looked swollen last night. She has recently been evaluated for back pain radiating into left leg with MRI done this weekend on 10/31/17 which was stable compared to her 2017 MRI with the following reading: IMPRESSION: 1. Overall stable appearing MRI examination lumbar spine when compared to prior study from 01/03/2016 2. Stable focal left paracentral disc protrusion at T12-L1. 3. Stable diffuse bulging annulus at L1-2. 4. Stable broad-based disc protrusion at L5-S1 which is asymmetric to the left side and could potentially irritate the left S1 nerve root. She is asking for a prescription for a cane today to help her walk.  Patient Active Problem List   Diagnosis Date Noted  . Acute pharyngitis 09/16/2017  . Abnormal TSH 09/16/2017  . Glaucoma 05/21/2017  . Tooth pain 04/28/2017  . Routine adult health maintenance 09/19/2016  . Class 1 obesity due to excess calories with serious comorbidity and body mass index (BMI) of 33.0 to 33.9 in adult 09/19/2016  . Vitamin D deficiency 09/19/2016  . Urinary frequency 02/14/2016  . Back pain 10/18/2015  . Type 2 diabetes mellitus (HCC) 07/10/2015  . Hormone replacement therapy (postmenopausal) 10/04/2014  . Mitral regurgitation 11/05/2012  . Aortic valve disease 10/21/2011  . Menorrhagia, premenopausal  09/01/2011  . Depression 03/04/2010  . GOUT 12/16/2006  . ANEMIA, IRON DEFICIENCY NOS 12/16/2006  . TOBACCO ABUSE 12/16/2006  . Essential hypertension 12/16/2006    Social History   Tobacco Use  . Smoking status: Current Every Day Smoker    Packs/day: 0.50    Years: 7.00    Pack years: 3.50    Types: Cigarettes  . Smokeless tobacco: Never Used  Substance Use Topics  . Alcohol use: No     Current Outpatient Medications:  .  amLODipine (NORVASC) 10 MG tablet, Take 1 tablet (10 mg total) by mouth daily., Disp: 90 tablet, Rfl: 0 .  brimonidine-timolol (COMBIGAN) 0.2-0.5 % ophthalmic solution, Place 1 drop into both eyes every 12 (twelve) hours., Disp: , Rfl:  .  estradiol (ESTRACE) 1 MG tablet, Take 1 tablet (1 mg total) by mouth daily., Disp: 30 tablet, Rfl: 5 .  glucose blood (ACCU-CHEK AVIVA) test strip, Use as instructed, Disp: 100 each, Rfl: 2 .  hydrOXYzine (ATARAX/VISTARIL) 50 MG tablet, Take 50 mg by mouth 3 (three) times daily as needed for itching., Disp: , Rfl:  .  Lancets (ACCU-CHEK SOFT TOUCH) lancets, Use as instructed, Disp: 100 each, Rfl: 12 .  losartan (COZAAR) 100 MG tablet, Take 1 tablet (100 mg total) by mouth daily., Disp: 90 tablet, Rfl: 0 .  medroxyPROGESTERone (PROVERA) 5 MG tablet, Take 1 tablet (5 mg total) by mouth daily., Disp: 30 tablet, Rfl: 5 .  metFORMIN (GLUCOPHAGE) 500 MG tablet, Take  1 tablet (500 mg total) by mouth daily., Disp: 90 tablet, Rfl: 0 .  methocarbamol (ROBAXIN) 500 MG tablet, Take 1 tablet (500 mg total) by mouth every 8 (eight) hours as needed for muscle spasms., Disp: 90 tablet, Rfl: 0 .  oxybutynin (DITROPAN-XL) 5 MG 24 hr tablet, Take 1 tablet (5 mg total) by mouth at bedtime., Disp: 30 tablet, Rfl: 6 .  predniSONE (DELTASONE) 10 MG tablet, 3 tabs x3 days and then 2 tabs x3 days and then 1 tab x3 days.  Take w/ food., Disp: 18 tablet, Rfl: 0 .  QUEtiapine (SEROQUEL) 50 MG tablet, Take 50 mg by mouth at bedtime., Disp: , Rfl:  .   timolol (BETIMOL) 0.25 % ophthalmic solution, Apply 1 drop 2 (two) times daily to eye., Disp: 10 mL, Rfl: 3 .  timolol (TIMOPTIC) 0.5 % ophthalmic solution, , Disp: , Rfl: 0 .  traMADol (ULTRAM) 50 MG tablet, Take 1 tablet (50 mg total) by mouth every 6 (six) hours as needed., Disp: 60 tablet, Rfl: 0 .  venlafaxine (EFFEXOR) 75 MG tablet, Take 75 mg by mouth 3 (three) times daily with meals., Disp: , Rfl:  .  Vitamin D, Ergocalciferol, (DRISDOL) 50000 units CAPS capsule, Take 1 capsule (50,000 Units total) by mouth every 7 (seven) days., Disp: 12 capsule, Rfl: 0  No Known Allergies  ROS  No other specific complaints in a complete review of systems (except as listed in HPI above).  Objective  Vitals:   11/02/17 0917  BP: (!) 150/84  Pulse: 82  Resp: 16  Temp: 98.8 F (37.1 C)  TempSrc: Oral  SpO2: 97%  Height:  (1.702 m)   Body mass index is 35.4 kg/m.  Nursing Note and Vital Signs reviewed.  Physical Exam  Constitutional: Patient appears well-developed and well-nourished. No distress.  HEENT: head atraumatic, normocephalic, pupils equal and reactive to light, EOM's intact, neck supple without lymphadenopathy, oropharynx pink and moist without exudate Cardiovascular: Normal rate, regular rhythm, distal pulses intact. No BLE edema. Pulmonary/Chest: Effort normal and breath sounds clear. No respiratory distress or retractions. Musculoskeletal: Normal range of motion, No gross deformities.  Neurological: She is alert and oriented to person, place, and time. Coordination, balance, strength, speech  are normal. Ambulatory with cane Skin: Skin is warm and dry. No rash noted. No erythema.  Psychiatric: Patient has a normal mood and affect. behavior is normal.   Assessment & Plan  Chronic pain of left knee Discussed home management of knee pain including return precautions and printed information on AVS Suspect could be related to her back pain, she is being referred to  specialist for further evaluation of her back pain She was instructed to F/U for continued knee pain after back pain evaluation [facility] to help with decreased mobility and prevent falls

## 2017-11-02 NOTE — Assessment & Plan Note (Signed)
Referral has been placed to specialist for further evaluation based on her MRI results from 10/31/17

## 2017-11-05 ENCOUNTER — Other Ambulatory Visit: Payer: Self-pay

## 2017-11-05 ENCOUNTER — Telehealth: Payer: Self-pay

## 2017-11-05 DIAGNOSIS — M25562 Pain in left knee: Principal | ICD-10-CM

## 2017-11-05 DIAGNOSIS — G8929 Other chronic pain: Secondary | ICD-10-CM

## 2017-11-05 NOTE — Telephone Encounter (Signed)
Faxed over DME order for cane to [facility] per patients request.

## 2017-11-05 NOTE — Progress Notes (Signed)
FYI

## 2017-11-09 ENCOUNTER — Telehealth: Payer: Self-pay | Admitting: Nurse Practitioner

## 2017-11-09 NOTE — Telephone Encounter (Signed)
Faxed Request for medical records to [facility] [phone]  11/09/17  LM

## 2017-11-13 ENCOUNTER — Telehealth: Payer: Self-pay | Admitting: Nurse Practitioner

## 2017-11-13 ENCOUNTER — Other Ambulatory Visit (INDEPENDENT_AMBULATORY_CARE_PROVIDER_SITE_OTHER)

## 2017-11-13 DIAGNOSIS — E119 Type 2 diabetes mellitus without complications: Secondary | ICD-10-CM | POA: Diagnosis not present

## 2017-11-13 LAB — HEMOGLOBIN A1C: Hgb A1c MFr Bld: 8 % — ABNORMAL HIGH (ref 4.6–6.5)

## 2017-11-13 MED ORDER — METFORMIN HCL ER 500 MG PO TB24: 500.0000 mg | ORAL_TABLET | Freq: Two times a day (BID) | ORAL | 2 refills | Status: DC

## 2017-11-13 NOTE — Telephone Encounter (Signed)
See my note below.  Thanks.  °

## 2017-11-13 NOTE — Telephone Encounter (Signed)
Let's try the XR version of Metformin; she is already on the lowest dosage; I want her to try once a day x 1 week and then try and increase to bid; if she can't tolerate this, please let [name] know so medication can be changed.  Needs repeat Hgba1c in 3 months;

## 2017-11-13 NOTE — Telephone Encounter (Signed)
Please advise. Thanks.  

## 2017-11-13 NOTE — Telephone Encounter (Signed)
Pt is aware of response below. [facility] to [facility] per pts request. She is ok with getting it in a few days.

## 2017-11-13 NOTE — Telephone Encounter (Signed)
Pt came in the office stating she is having low back pain and right side groin pain and wants to know if the back pain is causing the groin pain. I told her she would need an appt and she just wanted to talk to laura. I told her I would send a message. She also stated her metformin hurts her stomach and she was suppose to receive a dose that dis not hurt and had not hear anything regarding this. Please advuie and call back

## 2017-11-13 NOTE — Telephone Encounter (Signed)
In response to her question- yes, the back and groin pain are most likely related; keep follow-up with back specialist;  [name] should be getting in touch with her about diabetes management. I am not going to adjust the Metformin.

## 2017-11-13 NOTE — Telephone Encounter (Signed)
Can you send in a lower dose of metformin that could possibly be easier on her stomach?

## 2017-11-13 NOTE — Addendum Note (Signed)
Addended by: Mercer Pod E on: 11/13/2017 03:24 PM   Modules accepted: Orders

## 2017-11-13 NOTE — Telephone Encounter (Signed)
Please advise.  I have spoken with patient about keeping apt with Dr. Ophelia Charter and correlation of her symptoms.  Can you advise her after Stephanie Joseph addresses.  Thanks

## 2017-11-25 ENCOUNTER — Ambulatory Visit (INDEPENDENT_AMBULATORY_CARE_PROVIDER_SITE_OTHER): Admitting: Orthopaedic Surgery

## 2017-11-25 ENCOUNTER — Encounter (INDEPENDENT_AMBULATORY_CARE_PROVIDER_SITE_OTHER): Payer: Self-pay | Admitting: Orthopaedic Surgery

## 2017-11-25 VITALS — BP 152/87 | HR 72 | Ht 67.0 in | Wt 220.0 lb

## 2017-11-25 DIAGNOSIS — M545 Low back pain, unspecified: Secondary | ICD-10-CM

## 2017-11-25 NOTE — Progress Notes (Signed)
Office Visit Note/orthopedic consultation   Patient: Stephanie Joseph           Date of Birth: [birth year]           MRN: [id number] Visit Date: 11/25/2017              Requested by: Olive Bass, FNP [address] [place], [place] [address] PCP: Evaristo Bury, NP   Assessment & Plan: Visit Diagnoses:  1. Low back pain without sciatica, unspecified back pain laterality, unspecified chronicity     Plan: We discussed patient needs to work on smoking sensation drop at least 30 pounds to help unload her back.  She has some disc degeneration with mild protrusion unchanged from 2017 but does not need surgical intervention.  We discussed other benefits for weight reduction including improving her diabetes which is recently had an increase in her A1c up to 8.0.  This would also help with hypertension, depression etc.  She can walk and then rest.  She could look into possibly water therapy at the [facility] if possible.  She could start a walking program with her daughter as well as with her husband.  Office follow-up PRN.  Thank you for the opportunity to see her in consultation.  Follow-Up Instructions: Return if symptoms worsen or fail to improve.   Orders:  No orders of the defined types were placed in this encounter.  No orders of the defined types were placed in this encounter.     Procedures: No procedures performed   Clinical Data: No additional findings.   Subjective: Chief Complaint  Patient presents with  . Lower Back - Pain    HPI 57 year old female with problems with her back for couple years she went through therapy 2 years ago.  She has not worked since she cooked back in 2013.  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  She is had mid low back pain more pain into the left leg that radiates down to her left calf.  No associated bowel or bladder symptoms.  She took some prednisone she has diabetes and had recent elevation of her A1c to 8.0.  She is  tried ice also some tramadol.  Recent MRI scan shows some disc protrusion at L1-2 on the left also at L5-S1 on the left without nerve root compression.  These are stable in comparison to previous MRI scan in 2017.  Patient is a smoker.   Review of Systems positive for gout, depression, tobacco abuse, mitral regurgitation aortic valve disease.  Type 2 diabetes.  Increased BMI at 34.46.  Vitamin D deficiency, depression otherwise negative as it pertains HPI.   Objective: Vital Signs: BP (!) 152/87   Pulse 72   Ht  (1.702 m)   Wt 220 lb (99.8 kg)   LMP 02/13/2014 (Within Days)   BMI 34.46 kg/m   Physical Exam  Constitutional: She is oriented to person, place, and time. She appears well-developed.  HENT:  Head: Normocephalic.  Right Ear: External ear normal.  Left Ear: External ear normal.  Eyes: Pupils are equal, round, and reactive to light.  Neck: No tracheal deviation present. No thyromegaly present.  Cardiovascular: Normal rate.  Pulmonary/Chest: Effort normal.  Abdominal: Soft.  Neurological: She is alert and oriented to person, place, and time.  Skin: Skin is warm and dry.  Psychiatric: She has a normal mood and affect. Her behavior is normal.    Ortho Exam anterior tib EHL is intact no pitting  edema negative straight leg raising 90 degrees she has some tenderness over trochanter also lumbosacral junction and sciatic notch on the left.  No atrophy of the quads.  No hip flexion weakness negative logroll to both hips.  Distal pulses are palpable.  She has intact sensation no plantar foot lesions.  Specialty Comments:  No specialty comments available.  Imaging: No results found.   PMFS History: Patient Active Problem List   Diagnosis Date Noted  . Acute pharyngitis 09/16/2017  . Abnormal TSH 09/16/2017  . Glaucoma 05/21/2017  . Tooth pain 04/28/2017  . Routine adult health maintenance 09/19/2016  . Class 1 obesity due to excess calories with serious comorbidity and  body mass index (BMI) of 33.0 to 33.9 in adult 09/19/2016  . Vitamin D deficiency 09/19/2016  . Urinary frequency 02/14/2016  . Back pain 10/18/2015  . Type 2 diabetes mellitus (HCC) 07/10/2015  . Hormone replacement therapy (postmenopausal) 10/04/2014  . Mitral regurgitation 11/05/2012  . Aortic valve disease 10/21/2011  . Menorrhagia, premenopausal 09/01/2011  . Depression 03/04/2010  . GOUT 12/16/2006  . ANEMIA, IRON DEFICIENCY NOS 12/16/2006  . TOBACCO ABUSE 12/16/2006  . Essential hypertension 12/16/2006   Past Medical History:  Diagnosis Date  . Anemia, iron deficiency   . Arthritis   . Depression   . Diabetes mellitus without complication (HCC)   . Dysmenorrhea   . Gout   . Heart valve problem   . Hypertension   . Menorrhagia   . Mitral regurgitation   . Pneumonia     Family History  Problem Relation Age of Onset  . Hypertension Mother   . Stroke Paternal Grandmother   . Heart disease Sister   . Heart disease Brother     Past Surgical History:  Procedure Laterality Date  . KNEE ARTHROSCOPY     Left   Social History   Occupational History  . Occupation: Disability Pending  Tobacco Use  . Smoking status: Current Every Day Smoker    Packs/day: 0.50    Years: 7.00    Pack years: 3.50    Types: Cigarettes  . Smokeless tobacco: Never Used  Substance and Sexual Activity  . Alcohol use: No  . Drug use: No    Types: "Crack" cocaine  . Sexual activity: Yes    Birth control/protection: None

## 2017-12-07 ENCOUNTER — Telehealth: Payer: Self-pay

## 2017-12-07 NOTE — Telephone Encounter (Signed)
[facility]

## 2017-12-17 ENCOUNTER — Other Ambulatory Visit: Payer: Self-pay

## 2017-12-17 ENCOUNTER — Ambulatory Visit (HOSPITAL_COMMUNITY): Attending: Cardiology

## 2017-12-17 DIAGNOSIS — I340 Nonrheumatic mitral (valve) insufficiency: Secondary | ICD-10-CM | POA: Diagnosis not present

## 2017-12-17 DIAGNOSIS — I10 Essential (primary) hypertension: Secondary | ICD-10-CM | POA: Diagnosis not present

## 2017-12-17 DIAGNOSIS — I080 Rheumatic disorders of both mitral and aortic valves: Secondary | ICD-10-CM | POA: Insufficient documentation

## 2017-12-17 DIAGNOSIS — I351 Nonrheumatic aortic (valve) insufficiency: Secondary | ICD-10-CM

## 2017-12-17 DIAGNOSIS — E785 Hyperlipidemia, unspecified: Secondary | ICD-10-CM | POA: Insufficient documentation

## 2017-12-17 DIAGNOSIS — E119 Type 2 diabetes mellitus without complications: Secondary | ICD-10-CM | POA: Insufficient documentation

## 2017-12-17 DIAGNOSIS — Z720 Tobacco use: Secondary | ICD-10-CM | POA: Diagnosis not present

## 2017-12-21 ENCOUNTER — Ambulatory Visit (INDEPENDENT_AMBULATORY_CARE_PROVIDER_SITE_OTHER)

## 2017-12-21 ENCOUNTER — Ambulatory Visit (HOSPITAL_COMMUNITY)
Admission: EM | Admit: 2017-12-21 | Discharge: 2017-12-21 | Disposition: A | Discharge status: Home / Self Care | Attending: Internal Medicine | Admitting: Internal Medicine

## 2017-12-21 ENCOUNTER — Encounter (HOSPITAL_COMMUNITY): Payer: Self-pay | Admitting: Emergency Medicine

## 2017-12-21 DIAGNOSIS — M25561 Pain in right knee: Secondary | ICD-10-CM | POA: Diagnosis not present

## 2017-12-21 MED ORDER — NAPROXEN 375 MG PO TABS: 375.0000 mg | ORAL_TABLET | Freq: Two times a day (BID) | ORAL | 0 refills | Status: DC

## 2017-12-21 NOTE — Discharge Instructions (Signed)
Naproxen twice a day, take with food. Light and regular activity. See knee exercises.  Knee sleeve for compression and support. Please follow up with your primary care provider for recheck in the next two weeks if no improvement or worsening as may need to follow with orthopedics.

## 2017-12-21 NOTE — ED Provider Notes (Signed)
[facility]    CSN: [id number] Arrival date & time: 12/21/17  1033     History   Chief Complaint Chief Complaint  Patient presents with  . Knee Pain    HPI Stephanie Joseph is a 57 y.o. female.   Stephanie Joseph presents with complaints of right knee pain which started 6/11. No known injury or triggering event. Worse with increased activity. Has been using a cane which helps. No numbness or tingling to lower leg. Denies any previous similar. Pain 8/10. Has tried tramadol and muscle relaxer which have not helped. Hx of back pain, htn, dm, gout, arthritis   ROS per HPI.      Past Medical History:  Diagnosis Date  . Anemia, iron deficiency   . Arthritis   . Depression   . Diabetes mellitus without complication (HCC)   . Dysmenorrhea   . Gout   . Heart valve problem   . Hypertension   . Menorrhagia   . Mitral regurgitation   . Pneumonia     Patient Active Problem List   Diagnosis Date Noted  . Acute pharyngitis 09/16/2017  . Abnormal TSH 09/16/2017  . Glaucoma 05/21/2017  . Tooth pain 04/28/2017  . Routine adult health maintenance 09/19/2016  . Class 1 obesity due to excess calories with serious comorbidity and body mass index (BMI) of 33.0 to 33.9 in adult 09/19/2016  . Vitamin D deficiency 09/19/2016  . Urinary frequency 02/14/2016  . Back pain 10/18/2015  . Type 2 diabetes mellitus (HCC) 07/10/2015  . Hormone replacement therapy (postmenopausal) 10/04/2014  . Mitral regurgitation 11/05/2012  . Aortic valve disease 10/21/2011  . Menorrhagia, premenopausal 09/01/2011  . Depression 03/04/2010  . GOUT 12/16/2006  . ANEMIA, IRON DEFICIENCY NOS 12/16/2006  . TOBACCO ABUSE 12/16/2006  . Essential hypertension 12/16/2006    Past Surgical History:  Procedure Laterality Date  . KNEE ARTHROSCOPY     Left    OB History    Gravida  3   Para  1   Term      Preterm  1   AB  2   Living  1     SAB  1   TAB  1   Ectopic      Multiple      Live Births               Home Medications    Prior to Admission medications   Medication Sig Start Date End Date Taking? Authorizing Provider  amLODipine (NORVASC) 10 MG tablet Take 1 tablet (10 mg total) by mouth daily. 10/29/17   Evaristo BuryShambley, [name] N, NP  brimonidine-timolol (COMBIGAN) 0.2-0.5 % ophthalmic solution Place 1 drop into both eyes every 12 (twelve) hours.    [provider]  estradiol (ESTRACE) 1 MG tablet Take 1 tablet (1 mg total) by mouth daily. 07/23/17   Adam PhenixArnold, James G, MD  glucose blood (ACCU-CHEK AVIVA) test strip Use to check blood sugars 1-3 times daily. Dx Code: E11.9 11/02/17   Evaristo BuryShambley, [name] N, NP  hydrOXYzine (ATARAX/VISTARIL) 50 MG tablet Take 50 mg by mouth 3 (three) times daily as needed for itching.    [provider]  Lancets (ACCU-CHEK SOFT TOUCH) lancets Use as instructed 05/21/17   Evaristo BuryShambley, [name] N, NP  losartan (COZAAR) 100 MG tablet Take 1 tablet (100 mg total) by mouth daily. 10/29/17   Evaristo BuryShambley, [name] N, NP  medroxyPROGESTERone (PROVERA) 5 MG tablet Take 1 tablet (5 mg total) by mouth daily. 07/23/17  Adam Phenix, MD  metFORMIN (GLUCOPHAGE XR) 500 MG 24 hr tablet Take 1 tablet (500 mg total) by mouth 2 (two) times daily. 11/13/17   Evaristo Bury, NP  methocarbamol (ROBAXIN) 500 MG tablet Take 1 tablet (500 mg total) by mouth every 8 (eight) hours as needed for muscle spasms. 10/29/17   Evaristo Bury, NP  naproxen (NAPROSYN) 375 MG tablet Take 1 tablet (375 mg total) by mouth 2 (two) times daily. 12/21/17   Georgetta Haber, NP  oxybutynin (DITROPAN-XL) 5 MG 24 hr tablet Take 1 tablet (5 mg total) by mouth at bedtime. 06/09/17   Adam Phenix, MD  predniSONE (DELTASONE) 10 MG tablet 3 tabs x3 days and then 2 tabs x3 days and then 1 tab x3 days.  Take w/ food. 10/10/17   Sheliah Hatch, MD  QUEtiapine (SEROQUEL) 50 MG tablet Take 50 mg by mouth at bedtime.    [provider]  timolol (BETIMOL) 0.25 %  ophthalmic solution Apply 1 drop 2 (two) times daily to eye. 05/21/17   Evaristo Bury, NP  timolol (TIMOPTIC) 0.5 % ophthalmic solution  09/22/17   [provider]  traMADol (ULTRAM) 50 MG tablet Take 1 tablet (50 mg total) by mouth every 6 (six) hours as needed. 10/16/17   Olive Bass, FNP  venlafaxine (EFFEXOR) 75 MG tablet Take 75 mg by mouth 3 (three) times daily with meals.    [provider]  Vitamin D, Ergocalciferol, (DRISDOL) 50000 units CAPS capsule Take 1 capsule (50,000 Units total) by mouth every 7 (seven) days. 09/16/17   Corwin Levins, MD    Family History Family History  Problem Relation Age of Onset  . Hypertension Mother   . Stroke Paternal Grandmother   . Heart disease Sister   . Heart disease Brother     Social History Social History   Tobacco Use  . Smoking status: Current Every Day Smoker    Packs/day: 0.50    Years: 7.00    Pack years: 3.50    Types: Cigarettes  . Smokeless tobacco: Never Used  Substance Use Topics  . Alcohol use: No  . Drug use: No    Types: "Crack" cocaine     Allergies   Patient has no known allergies.   Review of Systems Review of Systems   Physical Exam Triage Vital Signs ED Triage Vitals [12/21/17 1115]  Enc Vitals Group     BP (!) 145/72     Pulse Rate 86     Resp 18     Temp 98.3 F (36.8 C)     Temp Source Oral     SpO2 100 %     Weight      Height      Head Circumference      Peak Flow      Pain Score      Pain Loc      Pain Edu?      Excl. in GC?    No data found.  Updated Vital Signs BP (!) 145/72 (BP Location: Left Arm)   Pulse 86   Temp 98.3 F (36.8 C) (Oral)   Resp 18   LMP 02/13/2014 (Within Days)   SpO2 100%   Visual Acuity Right Eye Distance:   Left Eye Distance:   Bilateral Distance:    Right Eye Near:   Left Eye Near:    Bilateral Near:     Physical Exam  Constitutional: She is  oriented to person, place, and time. She appears well-developed  and well-nourished. No distress.  Cardiovascular: Normal rate, regular rhythm and normal heart sounds.  Pulmonary/Chest: Effort normal and breath sounds normal.  Musculoskeletal:       Right knee: She exhibits normal range of motion, no swelling, no effusion, no ecchymosis, no deformity, no laceration, no LCL laxity, normal patellar mobility, no bony tenderness, normal meniscus and no MCL laxity. No tenderness found.  Pain within knee joint, is not palpable; no swelling or redness; pain with passive flexion and extension; no laxity and negative anterior drawer  Neurological: She is alert and oriented to person, place, and time.  Skin: Skin is warm and dry.     UC Treatments / Results  Labs (all labs ordered are listed, but only abnormal results are displayed) Labs Reviewed - No data to display  EKG None  Radiology Dg Knee Complete 4 Views Right  Result Date: 12/21/2017 CLINICAL DATA:  RIGHT knee pain for 1 week.  No injury is reported. EXAM: RIGHT KNEE - COMPLETE 4+ VIEW COMPARISON:  08/30/2009. FINDINGS: No evidence of fracture, dislocation, or joint effusion. There is mild narrowing of the medial compartment. Slight patellar spurring is noted. There are small calcifications in and around the quadriceps tendon. IMPRESSION: Mild degenerative change.  No acute findings. Electronically Signed   By: Elsie Stain M.D.   On: 12/21/2017 11:46    Procedures Procedures (including critical care time)  Medications Ordered in UC Medications - No data to display  Initial Impression / Assessment and Plan / UC Course  I have reviewed the triage vital signs and the nursing notes.  Pertinent labs & imaging results that were available during my care of the patient were reviewed by me and considered in my medical decision making (see chart for details).     Xray and exam consistent with arthritis. Naproxen twice a day. Knee sleeve provided. Follow up with PCP for recheck and management if  persists. Patient verbalized understanding and agreeable to plan.  Ambulatory out of clinic without difficulty with cane.   Final Clinical Impressions(s) / UC Diagnoses   Final diagnoses:  Right knee pain, unspecified chronicity     Discharge Instructions     Naproxen twice a day, take with food. Light and regular activity. See knee exercises.  Knee sleeve for compression and support. Please follow up with your primary care provider for recheck in the next two weeks if no improvement or worsening as may need to follow with orthopedics.    ED Prescriptions    Medication Sig Dispense Auth. Provider   naproxen (NAPROSYN) 375 MG tablet Take 1 tablet (375 mg total) by mouth 2 (two) times daily. 20 tablet Georgetta Haber, NP     Controlled Substance Prescriptions  Controlled Substance Registry consulted? Not Applicable   Georgetta Haber, NP 12/21/17 1203

## 2017-12-21 NOTE — ED Triage Notes (Signed)
Pt sts right knee pain

## 2017-12-28 ENCOUNTER — Other Ambulatory Visit: Payer: Self-pay

## 2017-12-28 MED ORDER — CANE MISC: 0 refills | Status: DC

## 2017-12-28 NOTE — Telephone Encounter (Signed)
Patient states that they wcannot do this and they need a RX. Please advise

## 2017-12-29 NOTE — Telephone Encounter (Signed)
A new rx has been faxed.

## 2018-01-20 ENCOUNTER — Ambulatory Visit (INDEPENDENT_AMBULATORY_CARE_PROVIDER_SITE_OTHER): Admitting: Family

## 2018-01-20 ENCOUNTER — Encounter: Payer: Self-pay | Admitting: Family

## 2018-01-20 VITALS — BP 152/82 | HR 71 | Temp 98.3°F | Ht 67.0 in | Wt 226.6 lb

## 2018-01-20 DIAGNOSIS — I10 Essential (primary) hypertension: Secondary | ICD-10-CM

## 2018-01-20 DIAGNOSIS — G8929 Other chronic pain: Secondary | ICD-10-CM

## 2018-01-20 DIAGNOSIS — F17200 Nicotine dependence, unspecified, uncomplicated: Secondary | ICD-10-CM | POA: Diagnosis not present

## 2018-01-20 DIAGNOSIS — J019 Acute sinusitis, unspecified: Secondary | ICD-10-CM

## 2018-01-20 DIAGNOSIS — M545 Low back pain: Secondary | ICD-10-CM

## 2018-01-20 MED ORDER — LOSARTAN POTASSIUM-HCTZ 100-12.5 MG PO TABS: 1.0000 | ORAL_TABLET | Freq: Every day | ORAL | 1 refills | Status: DC

## 2018-01-20 MED ORDER — AMOXICILLIN-POT CLAVULANATE 875-125 MG PO TABS: 1.0000 | ORAL_TABLET | Freq: Two times a day (BID) | ORAL | 0 refills | Status: DC

## 2018-01-20 MED ORDER — GLUCOSE BLOOD VI STRP: ORAL_STRIP | 1 refills | Status: AC

## 2018-01-20 MED ORDER — NAPROXEN 375 MG PO TABS: 375.0000 mg | ORAL_TABLET | Freq: Two times a day (BID) | ORAL | 0 refills | Status: DC

## 2018-01-20 MED ORDER — METHOCARBAMOL 500 MG PO TABS: 500.0000 mg | ORAL_TABLET | Freq: Three times a day (TID) | ORAL | 0 refills | Status: DC | PRN

## 2018-01-20 MED ORDER — METFORMIN HCL ER 500 MG PO TB24
500.0000 mg | ORAL_TABLET | Freq: Every day | ORAL | 2 refills | Status: DC
Start: 2018-01-20 — End: 2018-03-17

## 2018-01-20 NOTE — Progress Notes (Signed)
Stephanie Joseph is a 57 y.o. female with the following history as recorded in Epic[employer]:  Patient Active Problem List   Diagnosis Date Noted  . Acute pharyngitis 09/16/2017  . Abnormal TSH 09/16/2017  . Glaucoma 05/21/2017  . Tooth pain 04/28/2017  . Routine adult health maintenance 09/19/2016  . Class 1 obesity due to excess calories with serious comorbidity and body mass index (BMI) of 33.0 to 33.9 in adult 09/19/2016  . Vitamin D deficiency 09/19/2016  . Urinary frequency 02/14/2016  . Back pain 10/18/2015  . Type 2 diabetes mellitus (HCC) 07/10/2015  . Hormone replacement therapy (postmenopausal) 10/04/2014  . Mitral regurgitation 11/05/2012  . Aortic valve disease 10/21/2011  . Menorrhagia, premenopausal 09/01/2011  . Depression 03/04/2010  . GOUT 12/16/2006  . ANEMIA, IRON DEFICIENCY NOS 12/16/2006  . TOBACCO ABUSE 12/16/2006  . Essential hypertension 12/16/2006    Current Outpatient Medications  Medication Sig Dispense Refill  . amLODipine (NORVASC) 10 MG tablet Take 1 tablet (10 mg total) by mouth daily. 90 tablet 0  . brimonidine-timolol (COMBIGAN) 0.2-0.5 % ophthalmic solution Place 1 drop into both eyes every 12 (twelve) hours.    Marland Kitchen. estradiol (ESTRACE) 1 MG tablet Take 1 tablet (1 mg total) by mouth daily. 30 tablet 5  . glucose blood (ACCU-CHEK AVIVA) test strip Use to check blood sugars 1-3 times daily. Dx Code: E11.9 300 each 1  . hydrOXYzine (ATARAX/VISTARIL) 50 MG tablet Take 50 mg by mouth 3 (three) times daily as needed for itching.    . Lancets (ACCU-CHEK SOFT TOUCH) lancets Use as instructed 100 each 12  . medroxyPROGESTERone (PROVERA) 5 MG tablet Take 1 tablet (5 mg total) by mouth daily. 30 tablet 5  . metFORMIN (GLUCOPHAGE XR) 500 MG 24 hr tablet Take 1 tablet (500 mg total) by mouth daily. 60 tablet 2  . methocarbamol (ROBAXIN) 500 MG tablet Take 1 tablet (500 mg total) by mouth every 8 (eight) hours as needed for muscle spasms. 90 tablet 0  . Misc. Devices  (CANE) MISC Use as needed to help with mobility. 1 each 0  . naproxen (NAPROSYN) 375 MG tablet Take 1 tablet (375 mg total) by mouth 2 (two) times daily. 60 tablet 0  . oxybutynin (DITROPAN-XL) 5 MG 24 hr tablet Take 1 tablet (5 mg total) by mouth at bedtime. 30 tablet 6  . QUEtiapine (SEROQUEL) 50 MG tablet Take 50 mg by mouth at bedtime.    . traMADol (ULTRAM) 50 MG tablet Take 1 tablet (50 mg total) by mouth every 6 (six) hours as needed. 60 tablet 0  . venlafaxine (EFFEXOR) 75 MG tablet Take 75 mg by mouth 3 (three) times daily with meals.    . Vitamin D, Ergocalciferol, (DRISDOL) 50000 units CAPS capsule Take 1 capsule (50,000 Units total) by mouth every 7 (seven) days. 12 capsule 0  . amoxicillin-clavulanate (AUGMENTIN) 875-125 MG tablet Take 1 tablet by mouth 2 (two) times daily. 20 tablet 0  . losartan-hydrochlorothiazide (HYZAAR) 100-12.5 MG tablet Take 1 tablet by mouth daily. 90 tablet 1  . timolol (BETIMOL) 0.25 % ophthalmic solution Apply 1 drop 2 (two) times daily to eye. (Patient not taking: Reported on 01/20/2018) 10 mL 3  . timolol (TIMOPTIC) 0.5 % ophthalmic solution   0   No current facility-administered medications for this visit.     Allergies: Patient has no known allergies.  Past Medical History:  Diagnosis Date  . Anemia, iron deficiency   . Arthritis   . Depression   .  Diabetes mellitus without complication (HCC)   . Dysmenorrhea   . Gout   . Heart valve problem   . Hypertension   . Menorrhagia   . Mitral regurgitation   . Pneumonia     Past Surgical History:  Procedure Laterality Date  . KNEE ARTHROSCOPY     Left    Family History  Problem Relation Age of Onset  . Hypertension Mother   . Stroke Paternal Grandmother   . Heart disease Sister   . Heart disease Brother     Social History   Tobacco Use  . Smoking status: Current Every Day Smoker    Packs/day: 0.50    Years: 7.00    Pack years: 3.50    Types: Cigarettes  . Smokeless tobacco: Never  Used  Substance Use Topics  . Alcohol use: No    Subjective:  3 week history of cough/ congestion; + productive cough; "just can't seem to shake it." Not taking any OTC medication; + smoker; Due for her follow-up on blood pressure/ diabetes in 3 weeks- has not taken her blood pressure medication this morning; Denies any chest pain, shortness of breath, blurred vision or headache. Also requesting updated refills on her chronic medications to her mail order pharmacy;    Objective:  Vitals:   01/20/18 0826  BP: (!) 152/82  Pulse: 71  Temp: 98.3 F (36.8 C)  TempSrc: Oral  SpO2: 98%  Weight: 226 lb 9.6 oz (102.8 kg)  Height: 5\' 7"  (1.702 m)    General: Well developed, well nourished, in no acute distress  Skin : Warm and dry.  Head: Normocephalic and atraumatic  Eyes: Sclera and conjunctiva clear; pupils round and reactive to light; extraocular movements intact  Ears: External normal; canals clear; tympanic membranes normal  Oropharynx: Pink, supple. No suspicious lesions  Neck: Supple without thyromegaly, adenopathy  Lungs: Respirations unlabored; clear to auscultation bilaterally without wheeze, rales, rhonchi  CVS exam: normal rate and regular rhythm.  Neurologic: Alert and oriented; speech intact; face symmetrical; moves all extremities well; CNII-XII intact without focal deficit  Assessment:  1. Acute sinusitis, recurrence not specified, unspecified location   2. Chronic midline low back pain, with sciatica presence unspecified   3. Essential hypertension   4. TOBACCO ABUSE     Plan:  1. Rx for Augmentin 875 mg bid x 10 days; recommend to start Flonase; increase fluids, rest; follow-up worse, no better. 2. Refill updated for Robaxin and Naproxen as requested; 3. Uncontrolled- change to Losartan HCT 100/12.5; follow-up in 2 weeks as scheduled. 4. Stressed need to quit smoking.   No follow-ups on file.  No orders of the defined types were placed in this encounter.    Requested Prescriptions   Signed Prescriptions Disp Refills  . losartan-hydrochlorothiazide (HYZAAR) 100-12.5 MG tablet 90 tablet 1    Sig: Take 1 tablet by mouth daily.  Marland Kitchen glucose blood (ACCU-CHEK AVIVA) test strip 300 each 1    Sig: Use to check blood sugars 1-3 times daily. Dx Code: E11.9  . methocarbamol (ROBAXIN) 500 MG tablet 90 tablet 0    Sig: Take 1 tablet (500 mg total) by mouth every 8 (eight) hours as needed for muscle spasms.  . naproxen (NAPROSYN) 375 MG tablet 60 tablet 0    Sig: Take 1 tablet (375 mg total) by mouth 2 (two) times daily.  . metFORMIN (GLUCOPHAGE XR) 500 MG 24 hr tablet 60 tablet 2    Sig: Take 1 tablet (500 mg total) by  mouth daily.  Marland Kitchen amoxicillin-clavulanate (AUGMENTIN) 875-125 MG tablet 20 tablet 0    Sig: Take 1 tablet by mouth 2 (two) times daily.

## 2018-01-26 ENCOUNTER — Ambulatory Visit: Admitting: Nurse Practitioner

## 2018-01-29 ENCOUNTER — Ambulatory Visit (INDEPENDENT_AMBULATORY_CARE_PROVIDER_SITE_OTHER): Admitting: Nurse Practitioner

## 2018-01-29 ENCOUNTER — Encounter: Payer: Self-pay | Admitting: Nurse Practitioner

## 2018-01-29 ENCOUNTER — Telehealth: Payer: Self-pay | Admitting: Nurse Practitioner

## 2018-01-29 VITALS — BP 140/80 | HR 74 | Temp 98.9°F | Resp 16 | Ht 67.0 in | Wt 226.0 lb

## 2018-01-29 DIAGNOSIS — E119 Type 2 diabetes mellitus without complications: Secondary | ICD-10-CM | POA: Diagnosis not present

## 2018-01-29 DIAGNOSIS — M25562 Pain in left knee: Secondary | ICD-10-CM | POA: Diagnosis not present

## 2018-01-29 DIAGNOSIS — G8929 Other chronic pain: Secondary | ICD-10-CM | POA: Diagnosis not present

## 2018-01-29 DIAGNOSIS — I10 Essential (primary) hypertension: Secondary | ICD-10-CM | POA: Diagnosis not present

## 2018-01-29 MED ORDER — CANE MISC: 0 refills | Status: DC

## 2018-01-29 NOTE — Assessment & Plan Note (Signed)
We discussed metformin XR dosing and side effects, that it should not cause hypoglycemia, and the importance of daily medication adherence in the management of diabetes and she verbalizes understanding She was instructed to resume metformin daily RTC in 1 month for follow up, will recheck A1c for trend

## 2018-01-29 NOTE — Telephone Encounter (Signed)
Pt aware states she will come back next week.

## 2018-01-29 NOTE — Patient Instructions (Signed)
Please start taking your metformin daily.  Please return in about 1 month for annual physical with fasting lab work

## 2018-01-29 NOTE — Assessment & Plan Note (Signed)
BP appears stable on current regimen She will continue to monitor at home RTC in 1 month for F/U, will recheck BP

## 2018-01-29 NOTE — Telephone Encounter (Signed)
Please ask patient to stop by lab for blood draw. I am very sorry I missed this during her visit today, but due to losartan-HCT being a new medication for her we need to update her kidney function labwork

## 2018-01-29 NOTE — Addendum Note (Signed)
Addended by: Evaristo BurySHAMBLEY, Amunique Neyra N on: 01/29/2018 12:00 PM   Modules accepted: Orders

## 2018-01-29 NOTE — Progress Notes (Addendum)
Name: Stephanie Joseph   MRN: [id number]    DOB: [birth year]   Date:01/29/2018       Progress Note  Subjective  Chief Complaint  Chief Complaint  Patient presents with  . Follow-up    blood pressure    HPI MS Stephanie Joseph is here today for follow up of hypertension. We will follow up on her diabetes as well.  Hypertension -maintained on amlodipine 10 and Losartan HCT 100/12.5 once daily was added at last OV on 7/17 for continued elevated BP readings Reports she has been taking both medications daily as prescribed without noted adverse medication effects. Reports she occasionally checks her BP at home, recent readings 130s/80s Denies headaches, vision changes, chest pain, shortness of breath, edema.  BP Readings from Last 3 Encounters:  01/29/18 140/80  01/20/18 (!) 152/82  12/21/17 (!) 145/72   Diabetes- her metformin was switched to XR version in may due to nausea and not being able to tolerate the regular version of metformin She tells me today that she is able to tolerate the XR well, does not experience adverse effects but does not take the medicine daily because she is afraid it will cause her to have low blood sugars. Reports she has been checking her blood sugars daily, recently has had some readings up to 200. Denies tremor, diaphoresis, polyuria, polydipsia, polyphagia.  Lab Results  Component Value Date   HGBA1C 8.0 (H) 11/13/2017    Patient Active Problem List   Diagnosis Date Noted  . Acute pharyngitis 09/16/2017  . Abnormal TSH 09/16/2017  . Glaucoma 05/21/2017  . Tooth pain 04/28/2017  . Routine adult health maintenance 09/19/2016  . Class 1 obesity due to excess calories with serious comorbidity and body mass index (BMI) of 33.0 to 33.9 in adult 09/19/2016  . Vitamin D deficiency 09/19/2016  . Urinary frequency 02/14/2016  . Back pain 10/18/2015  . Type 2 diabetes mellitus (HCC) 07/10/2015  . Hormone replacement therapy (postmenopausal) 10/04/2014  . Mitral  regurgitation 11/05/2012  . Aortic valve disease 10/21/2011  . Menorrhagia, premenopausal 09/01/2011  . Depression 03/04/2010  . GOUT 12/16/2006  . ANEMIA, IRON DEFICIENCY NOS 12/16/2006  . TOBACCO ABUSE 12/16/2006  . Essential hypertension 12/16/2006    Past Surgical History:  Procedure Laterality Date  . KNEE ARTHROSCOPY     Left    Family History  Problem Relation Age of Onset  . Hypertension Mother   . Stroke Paternal Grandmother   . Heart disease Sister   . Heart disease Brother     Social History   Socioeconomic History  . Marital status: Married    Spouse name: Not on file  . Number of children: 1  . Years of education: 6912  . Highest education level: Not on file  Occupational History  . Occupation: Disability Pending  Social Needs  . Financial resource strain: Not on file  . Food insecurity:    Worry: Not on file    Inability: Not on file  . Transportation needs:    Medical: Not on file    Non-medical: Not on file  Tobacco Use  . Smoking status: Current Every Day Smoker    Packs/day: 0.50    Years: 7.00    Pack years: 3.50    Types: Cigarettes  . Smokeless tobacco: Never Used  Substance and Sexual Activity  . Alcohol use: No  . Drug use: No    Types: "Crack" cocaine  . Sexual activity: Yes    Birth control/protection: None  Lifestyle  . Physical activity:    Days per week: Not on file    Minutes per session: Not on file  . Stress: Not on file  Relationships  . Social connections:    Talks on phone: Not on file    Gets together: Not on file    Attends religious service: Not on file    Active member of club or organization: Not on file    Attends meetings of clubs or organizations: Not on file    Relationship status: Not on file  . Intimate partner violence:    Fear of current or ex partner: Not on file    Emotionally abused: Not on file    Physically abused: Not on file    Forced sexual activity: Not on file  Other Topics Concern  . Not  on file  Social History Narrative   Fun: Go to church   Denies abuse and feels safe at home.      Current Outpatient Medications:  .  amLODipine (NORVASC) 10 MG tablet, Take 1 tablet (10 mg total) by mouth daily., Disp: 90 tablet, Rfl: 0 .  amoxicillin-clavulanate (AUGMENTIN) 875-125 MG tablet, Take 1 tablet by mouth 2 (two) times daily., Disp: 20 tablet, Rfl: 0 .  brimonidine-timolol (COMBIGAN) 0.2-0.5 % ophthalmic solution, Place 1 drop into both eyes every 12 (twelve) hours., Disp: , Rfl:  .  estradiol (ESTRACE) 1 MG tablet, Take 1 tablet (1 mg total) by mouth daily., Disp: 30 tablet, Rfl: 5 .  glucose blood (ACCU-CHEK AVIVA) test strip, Use to check blood sugars 1-3 times daily. Dx Code: E11.9, Disp: 300 each, Rfl: 1 .  hydrOXYzine (ATARAX/VISTARIL) 50 MG tablet, Take 50 mg by mouth 3 (three) times daily as needed for itching., Disp: , Rfl:  .  Lancets (ACCU-CHEK SOFT TOUCH) lancets, Use as instructed, Disp: 100 each, Rfl: 12 .  losartan-hydrochlorothiazide (HYZAAR) 100-12.5 MG tablet, Take 1 tablet by mouth daily., Disp: 90 tablet, Rfl: 1 .  medroxyPROGESTERone (PROVERA) 5 MG tablet, Take 1 tablet (5 mg total) by mouth daily., Disp: 30 tablet, Rfl: 5 .  metFORMIN (GLUCOPHAGE XR) 500 MG 24 hr tablet, Take 1 tablet (500 mg total) by mouth daily., Disp: 60 tablet, Rfl: 2 .  methocarbamol (ROBAXIN) 500 MG tablet, Take 1 tablet (500 mg total) by mouth every 8 (eight) hours as needed for muscle spasms., Disp: 90 tablet, Rfl: 0 .  naproxen (NAPROSYN) 375 MG tablet, Take 1 tablet (375 mg total) by mouth 2 (two) times daily., Disp: 60 tablet, Rfl: 0 .  oxybutynin (DITROPAN-XL) 5 MG 24 hr tablet, Take 1 tablet (5 mg total) by mouth at bedtime., Disp: 30 tablet, Rfl: 6 .  QUEtiapine (SEROQUEL) 50 MG tablet, Take 50 mg by mouth at bedtime., Disp: , Rfl:  .  traMADol (ULTRAM) 50 MG tablet, Take 1 tablet (50 mg total) by mouth every 6 (six) hours as needed., Disp: 60 tablet, Rfl: 0 .  venlafaxine  (EFFEXOR) 75 MG tablet, Take 75 mg by mouth 3 (three) times daily with meals., Disp: , Rfl:  .  Misc. Devices (CANE) MISC, Use as needed for left knee pain., Disp: 1 each, Rfl: 0  No Known Allergies   ROS See HPI  Objective  Vitals:   01/29/18 0958  BP: 140/80  Pulse: 74  Resp: 16  Temp: 98.9 F (37.2 C)  TempSrc: Oral  SpO2: 99%  Weight: 226 lb (102.5 kg)  Height: 5\' 7"  (1.702 m)    Body  mass index is 35.4 kg/m.  Physical Exam Vital signs reviewed. Constitutional: Patient appears well-developed and well-nourished. No distress.  HEENT: head atraumatic, normocephalic, pupils equal and reactive to light, EOM's intact, neck supple, oropharynx pink and moist without exudate Cardiovascular: Normal rate, regular rhythm, distal pulses intact. No BLE edema. Pulmonary/Chest: Effort normal and breath sounds clear. No respiratory distress or retractions. Musculoskeletal: Normal range of motion, No gross deformities.  Neurological: She is alert and oriented to person, place, and time. Coordination, balance, strength, speech  are normal. Ambulatory with cane Skin: Skin is warm and dry. No rash noted. No erythema.  Psychiatric: Patient has a normal mood and affect. behavior is normal.    Assessment & Plan RTC in 1 month for CPE; F/U: HTN-recheck BP, DM- recheck A1c  Chronic pain of left knee - Cane adjustable single point  ADDENDUM: - Basic metabolic panel; Future-added after visit, we should update due to losartan HCT being a new medication for her, telephone call has been placed for patient to stop back by lab for blood draw

## 2018-02-05 ENCOUNTER — Other Ambulatory Visit (INDEPENDENT_AMBULATORY_CARE_PROVIDER_SITE_OTHER)

## 2018-02-05 DIAGNOSIS — I10 Essential (primary) hypertension: Secondary | ICD-10-CM

## 2018-02-05 LAB — BASIC METABOLIC PANEL
BUN: 11 mg/dL (ref 6–23)
CO2: 27 mEq/L (ref 19–32)
Calcium: 8.9 mg/dL (ref 8.4–10.5)
Chloride: 102 mEq/L (ref 96–112)
Creatinine, Ser: 0.89 mg/dL (ref 0.40–1.20)
GFR: 83.99 mL/min (ref >60.00)
Glucose, Bld: 203 mg/dL — ABNORMAL HIGH (ref 70–99)
Potassium: 4 mEq/L (ref 3.5–5.1)
Sodium: 137 mEq/L (ref 135–145)

## 2018-02-15 ENCOUNTER — Ambulatory Visit (INDEPENDENT_AMBULATORY_CARE_PROVIDER_SITE_OTHER)

## 2018-02-15 ENCOUNTER — Encounter (HOSPITAL_COMMUNITY): Payer: Self-pay

## 2018-02-15 ENCOUNTER — Ambulatory Visit (HOSPITAL_COMMUNITY)
Admission: EM | Admit: 2018-02-15 | Discharge: 2018-02-15 | Disposition: A | Discharge status: Home / Self Care | Attending: Family Medicine | Admitting: Family Medicine

## 2018-02-15 DIAGNOSIS — B9789 Other viral agents as the cause of diseases classified elsewhere: Secondary | ICD-10-CM

## 2018-02-15 DIAGNOSIS — J069 Acute upper respiratory infection, unspecified: Secondary | ICD-10-CM | POA: Diagnosis not present

## 2018-02-15 MED ORDER — BENZONATATE 100 MG PO CAPS: 100.0000 mg | ORAL_CAPSULE | Freq: Three times a day (TID) | ORAL | 0 refills | Status: DC

## 2018-02-15 MED ORDER — DM-GUAIFENESIN ER 30-600 MG PO TB12: 1.0000 | ORAL_TABLET | Freq: Two times a day (BID) | ORAL | 0 refills | Status: DC

## 2018-02-15 NOTE — Discharge Instructions (Addendum)
°  It was nice meeting you!!  I believe you have a viral upper respiratory infection. It could take 7 to 10 days for the symptoms to decrease or improve.  We will treat your cough with tessalon.  Zyrtec and mucinex for the drainage in your throat and congestion.  Ibuprofen and tylenol can help with the pain.  Chloraseptic throat spray or lozenges are another option for symptoms relief.  Continue the warm tea if that helps.  Follow up as needed for worsening symptoms.

## 2018-02-15 NOTE — ED Provider Notes (Signed)
[facility]    CSN: [id number] Arrival date & time: 02/15/18  4098     History   Chief Complaint Chief Complaint  Patient presents with  . URI    HPI Stephanie Joseph is a 57 y.o. female.   She is a 57 year old female past medical history of anemia, arthritis, depression, diabetes, gout, hypertension, mitral regurg, pneumonia.  She presents with 4 days of upper respiratory symptoms.  She has been having cough, congestion and unable to get the congestion up.  This is been constant but worse at night.  She has been drinking hot tea.  Denies any over-the-counter cold medicine.  Denies any fever, chills but has been having some body aches.  Denies any chest pain, shortness of breath.  She denies any sore throat, ear pain, nasal congestion, rhinorrhea.   She was treated 1 month ago for a sinus infection and finished the whole round of antibiotics.  That has gotten better but now the congestion is in her chest.  She has a history of pneumonia.  She does smoke.   ROS per HPI      Past Medical History:  Diagnosis Date  . Anemia, iron deficiency   . Arthritis   . Depression   . Diabetes mellitus without complication (HCC)   . Dysmenorrhea   . Gout   . Heart valve problem   . Hypertension   . Menorrhagia   . Mitral regurgitation   . Pneumonia     Patient Active Problem List   Diagnosis Date Noted  . Acute pharyngitis 09/16/2017  . Abnormal TSH 09/16/2017  . Glaucoma 05/21/2017  . Tooth pain 04/28/2017  . Routine adult health maintenance 09/19/2016  . Class 1 obesity due to excess calories with serious comorbidity and body mass index (BMI) of 33.0 to 33.9 in adult 09/19/2016  . Vitamin D deficiency 09/19/2016  . Urinary frequency 02/14/2016  . Back pain 10/18/2015  . Type 2 diabetes mellitus (HCC) 07/10/2015  . Hormone replacement therapy (postmenopausal) 10/04/2014  . Mitral regurgitation 11/05/2012  . Aortic valve disease 10/21/2011  . Menorrhagia,  premenopausal 09/01/2011  . Depression 03/04/2010  . GOUT 12/16/2006  . ANEMIA, IRON DEFICIENCY NOS 12/16/2006  . TOBACCO ABUSE 12/16/2006  . Essential hypertension 12/16/2006    Past Surgical History:  Procedure Laterality Date  . KNEE ARTHROSCOPY     Left    OB History    Gravida  3   Para  1   Term      Preterm  1   AB  2   Living  1     SAB  1   TAB  1   Ectopic      Multiple      Live Births               Home Medications    Prior to Admission medications   Medication Sig Start Date End Date Taking? Authorizing Provider  amLODipine (NORVASC) 10 MG tablet Take 1 tablet (10 mg total) by mouth daily. 10/29/17   Evaristo Bury, NP  amoxicillin-clavulanate (AUGMENTIN) 875-125 MG tablet Take 1 tablet by mouth 2 (two) times daily. 01/20/18   Olive Bass, FNP  benzonatate (TESSALON) 100 MG capsule Take 1 capsule (100 mg total) by mouth every 8 (eight) hours. 02/15/18   Wilena Tyndall, Gloris Manchester A, NP  brimonidine-timolol (COMBIGAN) 0.2-0.5 % ophthalmic solution Place 1 drop into both eyes every 12 (twelve) hours.    [provider]  dextromethorphan-guaiFENesin (  MUCINEX DM) 30-600 MG 12hr tablet Take 1 tablet by mouth 2 (two) times daily. 02/15/18   Dahlia ByesBast, Yovanny Coats A, NP  estradiol (ESTRACE) 1 MG tablet Take 1 tablet (1 mg total) by mouth daily. 07/23/17   Adam PhenixArnold, James G, MD  glucose blood (ACCU-CHEK AVIVA) test strip Use to check blood sugars 1-3 times daily. Dx Code: E11.9 01/20/18   Olive BassMurray, Laura Woodruff, FNP  hydrOXYzine (ATARAX/VISTARIL) 50 MG tablet Take 50 mg by mouth 3 (three) times daily as needed for itching.    [provider]  Lancets (ACCU-CHEK SOFT TOUCH) lancets Use as instructed 05/21/17   Evaristo BuryShambley, [name] N, NP  losartan-hydrochlorothiazide (HYZAAR) 100-12.5 MG tablet Take 1 tablet by mouth daily. 01/20/18   Olive BassMurray, Laura Woodruff, FNP  medroxyPROGESTERone (PROVERA) 5 MG tablet Take 1 tablet (5 mg total) by mouth daily. 07/23/17    Adam PhenixArnold, James G, MD  metFORMIN (GLUCOPHAGE XR) 500 MG 24 hr tablet Take 1 tablet (500 mg total) by mouth daily. 01/20/18   Olive BassMurray, Laura Woodruff, FNP  methocarbamol (ROBAXIN) 500 MG tablet Take 1 tablet (500 mg total) by mouth every 8 (eight) hours as needed for muscle spasms. 01/20/18   Olive BassMurray, Laura Woodruff, FNP  Misc. Devices (CANE) MISC Use as needed for left knee pain. 01/29/18   Evaristo BuryShambley, [name] N, NP  naproxen (NAPROSYN) 375 MG tablet Take 1 tablet (375 mg total) by mouth 2 (two) times daily. 01/20/18   Olive BassMurray, Laura Woodruff, FNP  oxybutynin (DITROPAN-XL) 5 MG 24 hr tablet Take 1 tablet (5 mg total) by mouth at bedtime. 06/09/17   Adam PhenixArnold, James G, MD  QUEtiapine (SEROQUEL) 50 MG tablet Take 50 mg by mouth at bedtime.    [provider]  traMADol (ULTRAM) 50 MG tablet Take 1 tablet (50 mg total) by mouth every 6 (six) hours as needed. 10/16/17   Olive BassMurray, Laura Woodruff, FNP  venlafaxine (EFFEXOR) 75 MG tablet Take 75 mg by mouth 3 (three) times daily with meals.    [provider]    Family History Family History  Problem Relation Age of Onset  . Hypertension Mother   . Stroke Paternal Grandmother   . Heart disease Sister   . Heart disease Brother     Social History Social History   Tobacco Use  . Smoking status: Current Every Day Smoker    Packs/day: 0.50    Years: 7.00    Pack years: 3.50    Types: Cigarettes  . Smokeless tobacco: Never Used  Substance Use Topics  . Alcohol use: No  . Drug use: No    Types: "Crack" cocaine     Allergies   Patient has no known allergies.   Review of Systems Review of Systems   Physical Exam Triage Vital Signs ED Triage Vitals  Enc Vitals Group     BP 02/15/18 0843 132/82     Pulse Rate 02/15/18 0843 81     Resp 02/15/18 0843 16     Temp 02/15/18 0843 98.1 F (36.7 C)     Temp Source 02/15/18 0843 Oral     SpO2 02/15/18 0843 97 %     Weight --      Height --      Head Circumference --      Peak Flow --       Pain Score 02/15/18 0844 7     Pain Loc --      Pain Edu? --      Excl. in GC? --  No data found.  Updated Vital Signs BP 132/82 (BP Location: Right Arm)   Pulse 81   Temp 98.1 F (36.7 C) (Oral)   Resp 16   LMP 02/13/2014 (Within Days)   SpO2 97%   Visual Acuity Right Eye Distance:   Left Eye Distance:   Bilateral Distance:    Right Eye Near:   Left Eye Near:    Bilateral Near:     Physical Exam  Constitutional: She is oriented to person, place, and time. She appears well-developed and well-nourished. No distress.  Pleasant in no distress  HENT:  Head: Normocephalic and atraumatic.  Right Ear: External ear normal.  Left Ear: External ear normal.  Nose: Nose normal.  Some drainage noted in the posterior oropharynx.  No tonsillar swelling or exudates  Neck: Normal range of motion.  No lymphadenopathy  Cardiovascular: Normal rate and regular rhythm.  Pulmonary/Chest: Effort normal. No respiratory distress. She exhibits no tenderness.  Rhonchi heard in left upper and lower lung.  Right lung sounds clear.   Musculoskeletal: Normal range of motion.  Neurological: She is alert and oriented to person, place, and time.  Skin: Skin is warm and dry. Capillary refill takes less than 2 seconds.  Psychiatric: She has a normal mood and affect.  Nursing note and vitals reviewed.    UC Treatments / Results  Labs (all labs ordered are listed, but only abnormal results are displayed) Labs Reviewed - No data to display  EKG None  Radiology Dg Chest 2 View  Result Date: 02/15/2018 CLINICAL DATA:  Productive cough. And congestion for the past chest x-ray 4 months dated April 24, 2016. EXAM: CHEST - 2 VIEW COMPARISON:  None. FINDINGS: Stable mild cardiomegaly. Normal pulmonary vascularity. No focal consolidation, pleural effusion, or pneumothorax. No acute osseous abnormality. IMPRESSION: 1.  No active cardiopulmonary disease. Electronically Signed   By: Obie Dredge  M.D.   On: 02/15/2018 09:14    Procedures Procedures (including critical care time)  Medications Ordered in UC Medications - No data to display  Initial Impression / Assessment and Plan / UC Course  I have reviewed the triage vital signs and the nursing notes.  Pertinent labs & imaging results that were available during my care of the patient were reviewed by me and considered in my medical decision making (see chart for details).     X-ray negative.  Most likely viral upper story infection with congestion in her chest.  We will give Mucinex for congestion and Tessalon for cough.  She may continue the warm tea.  Follow-up as needed or sooner for worsening symptoms.  Final Clinical Impressions(s) / UC Diagnoses   Final diagnoses:  Viral URI with cough     Discharge Instructions      It was nice meeting you!!  I believe you have a viral upper respiratory infection. It could take 7 to 10 days for the symptoms to decrease or improve.  We will treat your cough with tessalon.  Zyrtec and mucinex for the drainage in your throat and congestion.  Ibuprofen and tylenol can help with the pain.  Chloraseptic throat spray or lozenges are another option for symptoms relief.  Continue the warm tea if that helps.  Follow up as needed for worsening symptoms.      ED Prescriptions    Medication Sig Dispense Auth. Provider   benzonatate (TESSALON) 100 MG capsule Take 1 capsule (100 mg total) by mouth every 8 (eight) hours. 21 capsule Andren Bethea A,  NP   dextromethorphan-guaiFENesin (MUCINEX DM) 30-600 MG 12hr tablet Take 1 tablet by mouth 2 (two) times daily. 30 tablet Dahlia ByesBast, Oney Tatlock A, NP     Controlled Substance Prescriptions [place] Controlled Substance Registry consulted? Not Applicable   Janace ArisBast, Aldine Chakraborty A, NP 02/15/18 [phone]

## 2018-02-15 NOTE — ED Triage Notes (Signed)
Pt presents with upper respiratory symptoms; congestion, nasal drainage, coughing up phlegm and aching

## 2018-03-05 ENCOUNTER — Ambulatory Visit (INDEPENDENT_AMBULATORY_CARE_PROVIDER_SITE_OTHER): Admitting: Nurse Practitioner

## 2018-03-05 ENCOUNTER — Other Ambulatory Visit (INDEPENDENT_AMBULATORY_CARE_PROVIDER_SITE_OTHER)

## 2018-03-05 ENCOUNTER — Encounter: Payer: Self-pay | Admitting: Nurse Practitioner

## 2018-03-05 VITALS — BP 130/84 | HR 77 | Ht 67.0 in | Wt 225.0 lb

## 2018-03-05 DIAGNOSIS — Z122 Encounter for screening for malignant neoplasm of respiratory organs: Secondary | ICD-10-CM

## 2018-03-05 DIAGNOSIS — Z0001 Encounter for general adult medical examination with abnormal findings: Secondary | ICD-10-CM | POA: Diagnosis not present

## 2018-03-05 DIAGNOSIS — Z1231 Encounter for screening mammogram for malignant neoplasm of breast: Secondary | ICD-10-CM

## 2018-03-05 DIAGNOSIS — M545 Low back pain: Secondary | ICD-10-CM

## 2018-03-05 DIAGNOSIS — E119 Type 2 diabetes mellitus without complications: Secondary | ICD-10-CM

## 2018-03-05 DIAGNOSIS — Z13220 Encounter for screening for lipoid disorders: Secondary | ICD-10-CM

## 2018-03-05 DIAGNOSIS — G8929 Other chronic pain: Secondary | ICD-10-CM

## 2018-03-05 DIAGNOSIS — I10 Essential (primary) hypertension: Secondary | ICD-10-CM

## 2018-03-05 DIAGNOSIS — Z1239 Encounter for other screening for malignant neoplasm of breast: Secondary | ICD-10-CM

## 2018-03-05 LAB — LIPID PANEL
Cholesterol: 121 mg/dL (ref 0–200)
HDL: 50.8 mg/dL (ref >39.00)
LDL Cholesterol: 51 mg/dL (ref 0–99)
NonHDL: 70.46
Total CHOL/HDL Ratio: 2
Triglycerides: 97 mg/dL (ref 0.0–149.0)
VLDL: 19.4 mg/dL (ref 0.0–40.0)

## 2018-03-05 LAB — CBC
HCT: 37.2 % (ref 36.0–46.0)
Hemoglobin: 12 g/dL (ref 12.0–15.0)
MCHC: 32.4 g/dL (ref 30.0–36.0)
MCV: 82.4 fl (ref 78.0–100.0)
Platelets: 321 10*3/uL (ref 150.0–400.0)
RBC: 4.51 Mil/uL (ref 3.87–5.11)
RDW: 14.7 % (ref 11.5–15.5)
WBC: 5.9 10*3/uL (ref 4.0–10.5)

## 2018-03-05 LAB — HEMOGLOBIN A1C: Hgb A1c MFr Bld: 8.2 % — ABNORMAL HIGH (ref 4.6–6.5)

## 2018-03-05 MED ORDER — TRAMADOL HCL 50 MG PO TABS: 50.0000 mg | ORAL_TABLET | Freq: Four times a day (QID) | ORAL | 0 refills | Status: DC | PRN

## 2018-03-05 NOTE — Patient Instructions (Signed)
Please head downstairs for lab work. If any of your test results are critically abnormal, you will be contacted right away. Otherwise, I will contact you within a week about your test results and follow up recommendations  I will plan to see you back in 6 months, or sooner if needed.  Please schedule a follow up appointment for further evaluation here with Dr Tamala Julian or Dr Raeford Razor, our sports medicine providers for your back pain  Health Maintenance, Female Adopting a healthy lifestyle and getting preventive care can go a long way to promote health and wellness. Talk with your health care provider about what schedule of regular examinations is right for you. This is a good chance for you to check in with your provider about disease prevention and staying healthy. In between checkups, there are plenty of things you can do on your own. Experts have done a lot of research about which lifestyle changes and preventive measures are most likely to keep you healthy. Ask your health care provider for more information. Weight and diet Eat a healthy diet  Be sure to include plenty of vegetables, fruits, low-fat dairy products, and lean protein.  Do not eat a lot of foods high in solid fats, added sugars, or salt.  Get regular exercise. This is one of the most important things you can do for your health. ? Most adults should exercise for at least 150 minutes each week. The exercise should increase your heart rate and make you sweat (moderate-intensity exercise). ? Most adults should also do strengthening exercises at least twice a week. This is in addition to the moderate-intensity exercise.  Maintain a healthy weight  Body mass index (BMI) is a measurement that can be used to identify possible weight problems. It estimates body fat based on height and weight. Your health care provider can help determine your BMI and help you achieve or maintain a healthy weight.  For females 6 years of age and  older: ? A BMI below 18.5 is considered underweight. ? A BMI of 18.5 to 24.9 is normal. ? A BMI of 25 to 29.9 is considered overweight. ? A BMI of 30 and above is considered obese.  Watch levels of cholesterol and blood lipids  You should start having your blood tested for lipids and cholesterol at 57 years of age, then have this test every 5 years.  You may need to have your cholesterol levels checked more often if: ? Your lipid or cholesterol levels are high. ? You are older than 57 years of age. ? You are at high risk for heart disease.  Cancer screening Lung Cancer  Lung cancer screening is recommended for adults 39-51 years old who are at high risk for lung cancer because of a history of smoking.  A yearly low-dose CT scan of the lungs is recommended for people who: ? Currently smoke. ? Have quit within the past 15 years. ? Have at least a 30-pack-year history of smoking. A pack year is smoking an average of one pack of cigarettes a day for 1 year.  Yearly screening should continue until it has been 15 years since you quit.  Yearly screening should stop if you develop a health problem that would prevent you from having lung cancer treatment.  Breast Cancer  Practice breast self-awareness. This means understanding how your breasts normally appear and feel.  It also means doing regular breast self-exams. Let your health care provider know about any changes, no matter how small.  If you are in your 20s or 30s, you should have a clinical breast exam (CBE) by a health care provider every 1-3 years as part of a regular health exam.  If you are 33 or older, have a CBE every year. Also consider having a breast X-ray (mammogram) every year.  If you have a family history of breast cancer, talk to your health care provider about genetic screening.  If you are at high risk for breast cancer, talk to your health care provider about having an MRI and a mammogram every year.  Breast  cancer gene (BRCA) assessment is recommended for women who have family members with BRCA-related cancers. BRCA-related cancers include: ? Breast. ? Ovarian. ? Tubal. ? Peritoneal cancers.  Results of the assessment will determine the need for genetic counseling and BRCA1 and BRCA2 testing.  Cervical Cancer Your health care provider may recommend that you be screened regularly for cancer of the pelvic organs (ovaries, uterus, and vagina). This screening involves a pelvic examination, including checking for microscopic changes to the surface of your cervix (Pap test). You may be encouraged to have this screening done every 3 years, beginning at age 39.  For women ages 79-65, health care providers may recommend pelvic exams and Pap testing every 3 years, or they may recommend the Pap and pelvic exam, combined with testing for human papilloma virus (HPV), every 5 years. Some types of HPV increase your risk of cervical cancer. Testing for HPV may also be done on women of any age with unclear Pap test results.  Other health care providers may not recommend any screening for nonpregnant women who are considered low risk for pelvic cancer and who do not have symptoms. Ask your health care provider if a screening pelvic exam is right for you.  If you have had past treatment for cervical cancer or a condition that could lead to cancer, you need Pap tests and screening for cancer for at least 20 years after your treatment. If Pap tests have been discontinued, your risk factors (such as having a new sexual partner) need to be reassessed to determine if screening should resume. Some women have medical problems that increase the chance of getting cervical cancer. In these cases, your health care provider may recommend more frequent screening and Pap tests.  Colorectal Cancer  This type of cancer can be detected and often prevented.  Routine colorectal cancer screening usually begins at 57 years of age and  continues through 57 years of age.  Your health care provider may recommend screening at an earlier age if you have risk factors for colon cancer.  Your health care provider may also recommend using home test kits to check for hidden blood in the stool.  A small camera at the end of a tube can be used to examine your colon directly (sigmoidoscopy or colonoscopy). This is done to check for the earliest forms of colorectal cancer.  Routine screening usually begins at age 63.  Direct examination of the colon should be repeated every 5-10 years through 57 years of age. However, you may need to be screened more often if early forms of precancerous polyps or small growths are found.  Skin Cancer  Check your skin from head to toe regularly.  Tell your health care provider about any new moles or changes in moles, especially if there is a change in a mole's shape or color.  Also tell your health care provider if you have a mole that is  larger than the size of a pencil eraser.  Always use sunscreen. Apply sunscreen liberally and repeatedly throughout the day.  Protect yourself by wearing long sleeves, pants, a wide-brimmed hat, and sunglasses whenever you are outside.  Heart disease, diabetes, and high blood pressure  High blood pressure causes heart disease and increases the risk of stroke. High blood pressure is more likely to develop in: ? People who have blood pressure in the high end of the normal range (130-139/85-89 mm Hg). ? People who are overweight or obese. ? People who are African American.  If you are 38-43 years of age, have your blood pressure checked every 3-5 years. If you are 12 years of age or older, have your blood pressure checked every year. You should have your blood pressure measured twice-once when you are at a hospital or clinic, and once when you are not at a hospital or clinic. Record the average of the two measurements. To check your blood pressure when you are not  at a hospital or clinic, you can use: ? An automated blood pressure machine at a pharmacy. ? A home blood pressure monitor.  If you are between 64 years and 51 years old, ask your health care provider if you should take aspirin to prevent strokes.  Have regular diabetes screenings. This involves taking a blood sample to check your fasting blood sugar level. ? If you are at a normal weight and have a low risk for diabetes, have this test once every three years after 57 years of age. ? If you are overweight and have a high risk for diabetes, consider being tested at a younger age or more often. Preventing infection Hepatitis B  If you have a higher risk for hepatitis B, you should be screened for this virus. You are considered at high risk for hepatitis B if: ? You were born in a country where hepatitis B is common. Ask your health care provider which countries are considered high risk. ? Your parents were born in a high-risk country, and you have not been immunized against hepatitis B (hepatitis B vaccine). ? You have HIV or AIDS. ? You use needles to inject street drugs. ? You live with someone who has hepatitis B. ? You have had sex with someone who has hepatitis B. ? You get hemodialysis treatment. ? You take certain medicines for conditions, including cancer, organ transplantation, and autoimmune conditions.  Hepatitis C  Blood testing is recommended for: ? Everyone born from [birth year] through 1965. ? Anyone with known risk factors for hepatitis C.  Sexually transmitted infections (STIs)  You should be screened for sexually transmitted infections (STIs) including gonorrhea and chlamydia if: ? You are sexually active and are younger than 57 years of age. ? You are older than 57 years of age and your health care provider tells you that you are at risk for this type of infection. ? Your sexual activity has changed since you were last screened and you are at an increased risk for chlamydia  or gonorrhea. Ask your health care provider if you are at risk.  If you do not have HIV, but are at risk, it may be recommended that you take a prescription medicine daily to prevent HIV infection. This is called pre-exposure prophylaxis (PrEP). You are considered at risk if: ? You are sexually active and do not regularly use condoms or know the HIV status of your partner(s). ? You take drugs by injection. ? You are sexually active  with a partner who has HIV.  Talk with your health care provider about whether you are at high risk of being infected with HIV. If you choose to begin PrEP, you should first be tested for HIV. You should then be tested every 3 months for as long as you are taking PrEP. Pregnancy  If you are premenopausal and you may become pregnant, ask your health care provider about preconception counseling.  If you may become pregnant, take 400 to 800 micrograms (mcg) of folic acid every day.  If you want to prevent pregnancy, talk to your health care provider about birth control (contraception). Osteoporosis and menopause  Osteoporosis is a disease in which the bones lose minerals and strength with aging. This can result in serious bone fractures. Your risk for osteoporosis can be identified using a bone density scan.  If you are 69 years of age or older, or if you are at risk for osteoporosis and fractures, ask your health care provider if you should be screened.  Ask your health care provider whether you should take a calcium or vitamin D supplement to lower your risk for osteoporosis.  Menopause may have certain physical symptoms and risks.  Hormone replacement therapy may reduce some of these symptoms and risks. Talk to your health care provider about whether hormone replacement therapy is right for you. Follow these instructions at home:  Schedule regular health, dental, and eye exams.  Stay current with your immunizations.  Do not use any tobacco products  including cigarettes, chewing tobacco, or electronic cigarettes.  If you are pregnant, do not drink alcohol.  If you are breastfeeding, limit how much and how often you drink alcohol.  Limit alcohol intake to no more than 1 drink per day for nonpregnant women. One drink equals 12 ounces of beer, 5 ounces of wine, or 1 ounces of hard liquor.  Do not use street drugs.  Do not share needles.  Ask your health care provider for help if you need support or information about quitting drugs.  Tell your health care provider if you often feel depressed.  Tell your health care provider if you have ever been abused or do not feel safe at home. This information is not intended to replace advice given to you by your health care provider. Make sure you discuss any questions you have with your health care provider. Document Released: 01/06/2011 Document Revised: 11/29/2015 Document Reviewed: 03/27/2015 Elsevier Interactive Patient Education  [employer].

## 2018-03-05 NOTE — Assessment & Plan Note (Signed)
Tramadol refill today She did not want to return to specialty but [facility] here [facility] our office and she is agreeable, instructions given to schedule visit  - traMADol (ULTRAM) 50 MG tablet; Take 1 tablet (50 mg total) by mouth every 6 (six) hours as needed.  Dispense: 30 tablet; Refill: 0

## 2018-03-05 NOTE — Assessment & Plan Note (Signed)
Continue metformin at current dosage for now Update labs F/U with further recommendations pending lab results - Hemoglobin A1c; Future - CBC; Future

## 2018-03-05 NOTE — Progress Notes (Signed)
Name: Stephanie Joseph   MRN: [id number]    DOB: 02-May-1961   Date:03/05/2018       Progress Note  Subjective  Chief Complaint CPE  HPI  Patient presents for annual CPE. She is also requesting tramadol refill, says she has been taking 1 tramadol pill per day for LBP with good control of pain, she saw ortho for this in may but did not return for follow up.   Diet, Exercise: does walk daily, tries to eat salads but does admit she eats sweets frequently because she loves them  USPSTF grade A and B recommendations  Depression:  Dep[facility] screen [facility] 2/9 03/05/2018  Decreased Interest 1  Down, Depressed, Hopeless 1  PHQ - 2 Score 2   Hypertension: losartan-HCTZ 100-12.5, amlodipine 10  Reports daily medication complaince BP Readings from Last 3 Encounters:  03/05/18 130/84  02/15/18 132/82  01/29/18 140/80   Obesity: Wt Readings from Last 3 Encounters:  03/05/18 225 lb (102.1 kg)  01/29/18 226 lb (102.5 kg)  01/20/18 226 lb 9.6 oz (102.8 kg)   BMI Readings from Last 3 Encounters:  03/05/18 35.24 kg/m  01/29/18 35.40 kg/m  01/20/18 35.49 kg/m    Alcohol: no Tobacco use: current smoker, thinking of quitting  HIV, hep C: declines HIV, Hep C screening STD testing and prevention (chl/gon/syphilis): no concerns, declines Intimate partner violence: feels safe  Vaccinations: declines influenza and PNA vacc today  Breast cancer: mammogram order placed Cervical cancer screening: routine PAP done by Dr Debroah LoopArnold, GYN, per patient  Lipids: lipid panel today Lab Results  Component Value Date   CHOL 123 09/19/2016   CHOL 132 12/14/2007   Lab Results  Component Value Date   HDL 52.60 09/19/2016   HDL 89 12/14/2007   Lab Results  Component Value Date   LDLCALC 53 09/19/2016   LDLCALC 26 12/14/2007   Lab Results  Component Value Date   TRIG 91.0 09/19/2016   TRIG 83 12/14/2007   Lab Results  Component Value Date   CHOLHDL 2 09/19/2016   CHOLHDL 1.5 Ratio 12/14/2007    No results found for: LDLDIRECT  Diabetes- maintained on metformin XR 500 BID Reports daily medication compliance without adverse medication effects. Recent blood sugar readings 180-200  Lab Results  Component Value Date   HGBA1C 8.0 (H) 11/13/2017   Colorectal cancer: colonoscopy up to date Lung cancer: referral for lung cancer screening placed  Aspirin: not taking ECG: not indicated   Patient Active Problem List   Diagnosis Date Noted  . Acute pharyngitis 09/16/2017  . Abnormal TSH 09/16/2017  . Glaucoma 05/21/2017  . Tooth pain 04/28/2017  . Routine adult health maintenance 09/19/2016  . Class 1 obesity due to excess calories with serious comorbidity and body mass index (BMI) of 33.0 to 33.9 in adult 09/19/2016  . Vitamin D deficiency 09/19/2016  . Urinary frequency 02/14/2016  . Back pain 10/18/2015  . Type 2 diabetes mellitus (HCC) 07/10/2015  . Hormone replacement therapy (postmenopausal) 10/04/2014  . Mitral regurgitation 11/05/2012  . Aortic valve disease 10/21/2011  . Menorrhagia, premenopausal 09/01/2011  . Depression 03/04/2010  . GOUT 12/16/2006  . ANEMIA, IRON DEFICIENCY NOS 12/16/2006  . TOBACCO ABUSE 12/16/2006  . Essential hypertension 12/16/2006    Past Surgical History:  Procedure Laterality Date  . KNEE ARTHROSCOPY     Left    Family History  Problem Relation Age of Onset  . Hypertension Mother   . Stroke Paternal Grandmother   . Heart disease Sister   .  Heart disease Brother     Social History   Socioeconomic History  . Marital status: Married    Spouse name: Not on file  . Number of children: 1  . Years of education: 40  . Highest education level: Not on file  Occupational History  . Occupation: Disability Pending  Social Needs  . Financial resource strain: Not on file  . Food insecurity:    Worry: Not on file    Inability: Not on file  . Transportation needs:    Medical: Not on file    Non-medical: Not on file  Tobacco  Use  . Smoking status: Current Every Day Smoker    Packs/day: 0.50    Years: 7.00    Pack years: 3.50    Types: Cigarettes  . Smokeless tobacco: Never Used  Substance and Sexual Activity  . Alcohol use: No  . Drug use: No    Types: "Crack" cocaine  . Sexual activity: Yes    Birth control/protection: None  Lifestyle  . Physical activity:    Days per week: Not on file    Minutes per session: Not on file  . Stress: Not on file  Relationships  . Social connections:    Talks on phone: Not on file    Gets together: Not on file    Attends religious service: Not on file    Active member of club or organization: Not on file    Attends meetings of clubs or organizations: Not on file    Relationship status: Not on file  . Intimate partner violence:    Fear of current or ex partner: Not on file    Emotionally abused: Not on file    Physically abused: Not on file    Forced sexual activity: Not on file  Other Topics Concern  . Not on file  Social History Narrative   Fun: Go to church   Denies abuse and feels safe at home.      Current Outpatient Medications:  .  amLODipine (NORVASC) 10 MG tablet, Take 1 tablet (10 mg total) by mouth daily., Disp: 90 tablet, Rfl: 0 .  brimonidine-timolol (COMBIGAN) 0.2-0.5 % ophthalmic solution, Place 1 drop into both eyes every 12 (twelve) hours., Disp: , Rfl:  .  estradiol (ESTRACE) 1 MG tablet, Take 1 tablet (1 mg total) by mouth daily., Disp: 30 tablet, Rfl: 5 .  glucose blood (ACCU-CHEK AVIVA) test strip, Use to check blood sugars 1-3 times daily. Dx Code: E11.9, Disp: 300 each, Rfl: 1 .  hydrOXYzine (ATARAX/VISTARIL) 50 MG tablet, Take 50 mg by mouth 3 (three) times daily as needed for itching., Disp: , Rfl:  .  Lancets (ACCU-CHEK SOFT TOUCH) lancets, Use as instructed, Disp: 100 each, Rfl: 12 .  losartan-hydrochlorothiazide (HYZAAR) 100-12.5 MG tablet, Take 1 tablet by mouth daily., Disp: 90 tablet, Rfl: 1 .  medroxyPROGESTERone (PROVERA) 5 MG  tablet, Take 1 tablet (5 mg total) by mouth daily., Disp: 30 tablet, Rfl: 5 .  metFORMIN (GLUCOPHAGE XR) 500 MG 24 hr tablet, Take 1 tablet (500 mg total) by mouth daily., Disp: 60 tablet, Rfl: 2 .  methocarbamol (ROBAXIN) 500 MG tablet, Take 1 tablet (500 mg total) by mouth every 8 (eight) hours as needed for muscle spasms., Disp: 90 tablet, Rfl: 0 .  Misc. Devices (CANE) MISC, Use as needed for left knee pain., Disp: 1 each, Rfl: 0 .  naproxen (NAPROSYN) 375 MG tablet, Take 1 tablet (375 mg total) by mouth 2 (two)  times daily., Disp: 60 tablet, Rfl: 0 .  oxybutynin (DITROPAN-XL) 5 MG 24 hr tablet, Take 1 tablet (5 mg total) by mouth at bedtime., Disp: 30 tablet, Rfl: 6 .  QUEtiapine (SEROQUEL) 50 MG tablet, Take 50 mg by mouth at bedtime., Disp: , Rfl:  .  traMADol (ULTRAM) 50 MG tablet, Take 1 tablet (50 mg total) by mouth every 6 (six) hours as needed., Disp: 60 tablet, Rfl: 0 .  venlafaxine (EFFEXOR) 75 MG tablet, Take 75 mg by mouth 3 (three) times daily with meals., Disp: , Rfl:   No Known Allergies   ROS  Constitutional: Negative for fever or weight change.  Respiratory: Negative for cough and shortness of breath.   Cardiovascular: Negative for chest pain or palpitations.  Gastrointestinal: Negative for abdominal pain, no bowel changes.  Musculoskeletal: Negative for gait problem or joint swelling.  Skin: Negative for rash.  Neurological: Negative for dizziness or headache.  No other specific complaints in a complete review of systems (except as listed in HPI above).   Objective  Vitals:   03/05/18 0923  BP: 130/84  Pulse: 77  SpO2: 97%  Weight: 225 lb (102.1 kg)  Height: 5\' 7"  (1.702 m)    Body mass index is 35.24 kg/m.  Physical Exam Vital signs reviewed. Constitutional: Patient appears well-developed and well-nourished. No distress.  HENT: Head: Normocephalic and atraumatic. Ears: B TMs ok, no erythema or effusion; Nose: Nose normal. Mouth/Throat: Oropharynx is  clear and moist. No oropharyngeal exudate.  Eyes: Conjunctivae and EOM are normal. Pupils are equal, round, and reactive to light. No scleral icterus.  Neck: Normal range of motion. Neck supple. No thyromegaly present.  Cardiovascular: Normal rate, regular rhythm and normal heart sounds.   No BLE edema. Distal pulses intact. Pulmonary/Chest: Effort normal and breath sounds normal. No respiratory distress. Abdominal: Soft. Bowel sounds are normal, no distension. There is no tenderness. no masses Breast: defd to mammo FEMALE GENITALIA:  defd to GYN Musculoskeletal: Normal range of motion, No gross deformities Neurological: She is alert and oriented to person, place, and time. No cranial nerve deficit. Coordination, balance, strength, speech and gait are normal.  Skin: Skin is warm and dry. No rash noted. No erythema.  Psychiatric: Patient has a normal mood and affect. behavior is normal. Judgment and thought content normal.   Assessment & Plan RTC in 6 months for routine F/U CMET with normal kidney and liver function on 10/29/17

## 2018-03-05 NOTE — Assessment & Plan Note (Signed)
Stable Continue current meds Update labs - Lipid panel; Future - CBC; Future

## 2018-03-05 NOTE — Assessment & Plan Note (Signed)
-  USPSTF grade A and B recommendations reviewed with patient; age-appropriate recommendations, preventive care, screening tests, etc discussed and encouraged; healthy living encouraged; see AVS for patient education given to patient -Discussed healthy diet and exercise and printed additional information in AVS  -Follow up and care instructions discussed and provided in AVS.  -Reviewed Health Maintenance:  declines PNA, flu vacc Request sent to GYN Dr Debroah Looparnold to update PAP results in our records Declines HIV, hep C testing  - Encounter for general adult medical examination with abnormal findings - MM DIGITAL SCREENING BILATERAL; Future-Screening for breast cancer - Hemoglobin A1c; Future - Ambulatory Referral for Lung Cancer Scre-Encounter for screening for lung cancer - Lipid panel; Future-Screening for cholesterol level - CBC; Future

## 2018-03-09 ENCOUNTER — Telehealth: Payer: Self-pay | Admitting: Acute Care

## 2018-03-09 NOTE — Telephone Encounter (Signed)
Spoke with pt regarding lung cancer screening.  Pt does not qualify due to less than 30 pack year smoking history. Letter sent to Stephanie Joseph to let her know referral has been cancelled.

## 2018-03-12 ENCOUNTER — Ambulatory Visit: Admitting: Family Medicine

## 2018-03-17 ENCOUNTER — Other Ambulatory Visit: Payer: Self-pay | Admitting: Nurse Practitioner

## 2018-03-17 DIAGNOSIS — E119 Type 2 diabetes mellitus without complications: Secondary | ICD-10-CM

## 2018-03-17 MED ORDER — METFORMIN HCL ER 500 MG PO TB24: 1000.0000 mg | ORAL_TABLET | Freq: Every day | ORAL | 2 refills | Status: DC

## 2018-03-18 NOTE — Progress Notes (Signed)
Stephanie Joseph - 57 y.o. female MRN [id number]  Date of birth: [birth year]  SUBJECTIVE:  Including CC & ROS.  Chief Complaint  Patient presents with  . Back Pain    Stephanie Joseph is a 57 y.o. female that is presenting with lower back pain. Pain is chronic, increasing more recently. Pain is located near her tailbone and radiates down to her gluteals. Denies injury or trauma. Mild to severe when she is siting. Sitting and standing trigger the pain. Denies tingling or numbness. She has been applying ice and heat. She has been taking Robaxin with some improvement. Denies surgery or trauma. She has completed physical therapy in the past and been treated at [facility].   Review of the MRI lumbar spine from 4/27 shows disc disease and facet disease with slight bulging of 4-5.  Broad-based disc protrusion asymmetric left L5-S1.   Review of Systems  Constitutional: Negative for fever.  HENT: Negative for congestion.   Respiratory: Negative for cough.   Cardiovascular: Negative for chest pain.  Gastrointestinal: Negative for abdominal pain.  Musculoskeletal: Positive for back pain.  Skin: Negative for color change.  Neurological: Negative for weakness.  Hematological: Negative for adenopathy.  Psychiatric/Behavioral: Negative for agitation.    HISTORY: Past Medical, Surgical, Social, and Family History Reviewed & Updated per EMR.   Pertinent Historical Findings include:  Past Medical History:  Diagnosis Date  . Anemia, iron deficiency   . Arthritis   . Depression   . Diabetes mellitus without complication (HCC)   . Dysmenorrhea   . Gout   . Heart valve problem   . Hypertension   . Menorrhagia   . Mitral regurgitation   . Pneumonia     Past Surgical History:  Procedure Laterality Date  . KNEE ARTHROSCOPY     Left    No Known Allergies  Family History  Problem Relation Age of Onset  . Hypertension Mother   . Stroke Paternal Grandmother   . Heart disease  Sister   . Heart disease Brother      Social History   Socioeconomic History  . Marital status: Married    Spouse name: Not on file  . Number of children: 1  . Years of education: 58  . Highest education level: Not on file  Occupational History  . Occupation: Disability Pending  Social Needs  . Financial resource strain: Not on file  . Food insecurity:    Worry: Not on file    Inability: Not on file  . Transportation needs:    Medical: Not on file    Non-medical: Not on file  Tobacco Use  . Smoking status: Current Every Day Smoker    Packs/day: 0.50    Years: 7.00    Pack years: 3.50    Types: Cigarettes  . Smokeless tobacco: Never Used  Substance and Sexual Activity  . Alcohol use: No  . Drug use: No    Types: "Crack" cocaine  . Sexual activity: Yes    Birth control/protection: None  Lifestyle  . Physical activity:    Days per week: Not on file    Minutes per session: Not on file  . Stress: Not on file  Relationships  . Social connections:    Talks on phone: Not on file    Gets together: Not on file    Attends religious service: Not on file    Active member of club or organization: Not on file    Attends meetings of clubs or organizations:  Not on file    Relationship status: Not on file  . Intimate partner violence:    Fear of current or ex partner: Not on file    Emotionally abused: Not on file    Physically abused: Not on file    Forced sexual activity: Not on file  Other Topics Concern  . Not on file  Social History Narrative   Fun: Go to church   Denies abuse and feels safe at home.      PHYSICAL EXAM:  VS: BP (!) 144/86   Pulse 88   Ht 5\' 7"  (1.702 m)   Wt 224 lb (101.6 kg)   LMP 02/13/2014 (Within Days)   SpO2 98%   BMI 35.08 kg/m  Physical Exam Gen: NAD, alert, cooperative with exam, well-appearing ENT: normal lips, normal nasal mucosa,  Eye: normal EOM, normal conjunctiva and lids CV:  no edema, +2 pedal pulses   Resp: no accessory  muscle use, non-labored,   Skin: no rashes, no areas of induration  Neuro: normal tone, normal sensation to touch Psych:  normal insight, alert and oriented MSK:  Back Exam:  Inspection: Unremarkable  Mild tenderness to palpation over the SI joints and in the paraspinal lumbar muscles bilaterally. Leg strength: Quad: 5/5 Hamstring: 5/5 Hip flexor: 5/5 Strength at foot: Plantar-flexion: 5/5 Dorsi-flexion: 5/5 Eversion: 5/5 Inversion: 5/5  Reflexes: 2+ at both patellar tendons, Gait unremarkable. SLR laying: Negative  XSLR laying: Negative  FABER: negative. Neurovascularly intact      ASSESSMENT & PLAN:   Back pain Has been dealing with pain.  Today is acute on chronic in nature.  Possible to have component of SI joint dysfunction.  Does not appear to be a nerve impingement today. -Counseled on home exercise therapy -If no improvement could consider trigger point injections versus physical therapy

## 2018-03-19 ENCOUNTER — Ambulatory Visit (INDEPENDENT_AMBULATORY_CARE_PROVIDER_SITE_OTHER): Admitting: Family Medicine

## 2018-03-19 ENCOUNTER — Encounter: Payer: Self-pay | Admitting: Family Medicine

## 2018-03-19 DIAGNOSIS — M545 Low back pain, unspecified: Secondary | ICD-10-CM

## 2018-03-19 NOTE — Patient Instructions (Signed)
Nice to meet you  Please try the exercises  Please try massage on the lower back  Please stay active  Please try heat or ice on the lower back  Please see me back in 8 weeks if your pain isn't improving.

## 2018-03-21 NOTE — Assessment & Plan Note (Signed)
Has been dealing with pain.  Today is acute on chronic in nature.  Possible to have component of SI joint dysfunction.  Does not appear to be a nerve impingement today. -Counseled on home exercise therapy -If no improvement could consider trigger point injections versus physical therapy

## 2018-03-29 ENCOUNTER — Other Ambulatory Visit: Payer: Self-pay | Admitting: Nurse Practitioner

## 2018-03-29 DIAGNOSIS — E119 Type 2 diabetes mellitus without complications: Secondary | ICD-10-CM

## 2018-03-29 MED ORDER — ATORVASTATIN CALCIUM 10 MG PO TABS: 10.0000 mg | ORAL_TABLET | Freq: Every day | ORAL | 1 refills | Status: DC

## 2018-03-29 NOTE — Addendum Note (Signed)
Addended by: Merrilyn PumaSIMMONS, Hend Mccarrell N on: 03/29/2018 04:41 PM   Modules accepted: Orders

## 2018-03-29 NOTE — Progress Notes (Signed)
pitor

## 2018-04-09 ENCOUNTER — Ambulatory Visit
Admission: RE | Admit: 2018-04-09 | Discharge: 2018-04-09 | Disposition: A | Discharge status: Home / Self Care | Source: Ambulatory Visit | Attending: Nurse Practitioner | Admitting: Nurse Practitioner

## 2018-04-09 DIAGNOSIS — Z1239 Encounter for other screening for malignant neoplasm of breast: Secondary | ICD-10-CM

## 2018-04-09 DIAGNOSIS — Z0001 Encounter for general adult medical examination with abnormal findings: Secondary | ICD-10-CM

## 2018-05-11 ENCOUNTER — Ambulatory Visit (INDEPENDENT_AMBULATORY_CARE_PROVIDER_SITE_OTHER): Admitting: *Deleted

## 2018-05-11 DIAGNOSIS — Z23 Encounter for immunization: Secondary | ICD-10-CM

## 2018-05-12 ENCOUNTER — Ambulatory Visit

## 2018-05-13 ENCOUNTER — Other Ambulatory Visit: Payer: Self-pay | Admitting: Nurse Practitioner

## 2018-05-13 ENCOUNTER — Other Ambulatory Visit: Payer: Self-pay | Admitting: Obstetrics & Gynecology

## 2018-05-13 DIAGNOSIS — Z780 Asymptomatic menopausal state: Secondary | ICD-10-CM

## 2018-05-13 DIAGNOSIS — E119 Type 2 diabetes mellitus without complications: Secondary | ICD-10-CM

## 2018-05-13 MED ORDER — METFORMIN HCL ER 500 MG PO TB24: 1000.0000 mg | ORAL_TABLET | Freq: Every day | ORAL | 2 refills | Status: DC

## 2018-05-13 NOTE — Telephone Encounter (Signed)
Copied from CRM [id number]. Topic: Quick Communication - Rx Refill/Question >> May 13, 2018 10:11 AM Jaquita Rector A wrote: Medication: metFORMIN (GLUCOPHAGE XR) 500 MG 24 hr tablet   Patient states that she is out of medication because she was told to take 4 tabs instead of 2 tabs daily.   Has the patient contacted their pharmacy? Yes.     Preferred Pharmacy (with phone number or street name): [facility] [address] [place], [place] - [address] [phone] (Phone) [phone] (Fax)    Agent: Please be advised that RX refills may take up to 3 business days. We ask that you follow-up with your pharmacy.

## 2018-05-13 NOTE — Telephone Encounter (Signed)
Requested medication (s) are due for refill today: yes  Requested medication (s) are on the active medication list: yes    Last refill: 03/17/18  #120 2 refills  Future visit scheduled yes 07/12/2018  Notes to clinic:New prescription needed; changed to 2 tabs twice daily on 03/05/18  Requested Prescriptions  Pending Prescriptions Disp Refills   metFORMIN (GLUCOPHAGE XR) 500 MG 24 hr tablet 120 tablet 2    Sig: Take 2 tablets (1,000 mg total) by mouth daily.     Endocrinology:  Diabetes - Biguanides Failed - 05/13/2018 10:16 AM      Failed - HBA1C is between 0 and 7.9 and within 180 days    Hgb A1c MFr Bld  Date Value Ref Range Status  03/05/2018 8.2 (H) 4.6 - 6.5 % Final    Comment:    Glycemic Control Guidelines for People with Diabetes:Non Diabetic:  <6%Goal of Therapy: <7%Additional Action Suggested:  >8%          Passed - Cr in normal range and within 360 days    Creatinine, Ser  Date Value Ref Range Status  02/05/2018 0.89 0.40 - 1.20 mg/dL Final         Passed - eGFR in normal range and within 360 days    GFR calc Af Amer  Date Value Ref Range Status  04/24/2016 >60 >60 mL/min Final    Comment:    (NOTE) The eGFR has been calculated using the CKD EPI equation. This calculation has not been validated in all clinical situations. eGFR's persistently <60 mL/min signify possible Chronic Kidney Disease.    GFR calc non Af Amer  Date Value Ref Range Status  04/24/2016 >60 >60 mL/min Final   GFR  Date Value Ref Range Status  02/05/2018 83.99 >60.00 mL/min Final         Passed - Valid encounter within last 6 months    Recent Outpatient Visits          1 month ago Acute bilateral low back pain without sciatica   [place], Enid Baas, MD   2 months ago Encounter for general adult medical examination with abnormal findings   [place], Delphia Grates, NP   3 months ago Chronic pain of left knee   [place], [name] N, NP   3 months ago Acute sinusitis, recurrence not specified, unspecified location   [place], Mar[place] Repress, [place]   6 months ago Essential hypertension   [place], Delphia Grates, NP      Future Appointments            In 2 months [place], Delphia Grates, NP [employer] [place], [place]   In 3 months [place], Delphia Grates, NP [place], [facility]

## 2018-05-14 ENCOUNTER — Telehealth: Payer: Self-pay

## 2018-05-14 DIAGNOSIS — E119 Type 2 diabetes mellitus without complications: Secondary | ICD-10-CM

## 2018-05-14 NOTE — Telephone Encounter (Signed)
Copied from CRM [id number]. Topic: General - Other >> May 14, 2018 10:43 AM Percival Spanish wrote:  Pt call to say when she had spoke to the nurse she told her that sometime she forget to take 2 of the 4 pills. She said the nurse told her that they could put it all in 1 pill and she would not have to worry about missing her pills  metFORMIN (GLUCOPHAGE XR) 500 MG 24 hr tablet

## 2018-05-17 MED ORDER — METFORMIN HCL ER 500 MG PO TB24: 1000.0000 mg | ORAL_TABLET | Freq: Two times a day (BID) | ORAL | 2 refills | Status: DC

## 2018-05-17 NOTE — Telephone Encounter (Signed)
im sorry she has to take this medication twice daily as directed

## 2018-05-17 NOTE — Telephone Encounter (Signed)
Pt informed of below.  

## 2018-05-22 ENCOUNTER — Other Ambulatory Visit: Payer: Self-pay | Admitting: Obstetrics & Gynecology

## 2018-05-22 DIAGNOSIS — Z780 Asymptomatic menopausal state: Secondary | ICD-10-CM

## 2018-06-14 ENCOUNTER — Other Ambulatory Visit: Payer: Self-pay

## 2018-06-14 DIAGNOSIS — Z780 Asymptomatic menopausal state: Secondary | ICD-10-CM

## 2018-06-14 NOTE — Telephone Encounter (Signed)
[facility] on Provera 5mg mg tab. Sent inbasket message to Dr.Arnold for recommendation.

## 2018-06-15 ENCOUNTER — Other Ambulatory Visit: Payer: Self-pay | Admitting: Obstetrics & Gynecology

## 2018-06-15 DIAGNOSIS — Z780 Asymptomatic menopausal state: Secondary | ICD-10-CM

## 2018-06-15 MED ORDER — MEDROXYPROGESTERONE ACETATE 5 MG PO TABS: 5.0000 mg | ORAL_TABLET | Freq: Every day | ORAL | 7 refills | Status: DC

## 2018-06-15 MED ORDER — MEDROXYPROGESTERONE ACETATE 5 MG PO TABS: 5.0000 mg | ORAL_TABLET | Freq: Every day | ORAL | 5 refills | Status: DC

## 2018-06-15 NOTE — Telephone Encounter (Signed)
Thank you. Rx Sent.

## 2018-06-15 NOTE — Telephone Encounter (Signed)
Rx for Provera is approved; I could not e prescribe

## 2018-07-08 ENCOUNTER — Encounter: Payer: Self-pay | Admitting: Cardiovascular Disease

## 2018-07-12 ENCOUNTER — Ambulatory Visit (INDEPENDENT_AMBULATORY_CARE_PROVIDER_SITE_OTHER)
Admission: RE | Admit: 2018-07-12 | Discharge: 2018-07-12 | Disposition: A | Discharge status: Home / Self Care | Source: Ambulatory Visit | Attending: Nurse Practitioner | Admitting: Nurse Practitioner

## 2018-07-12 ENCOUNTER — Other Ambulatory Visit (INDEPENDENT_AMBULATORY_CARE_PROVIDER_SITE_OTHER)

## 2018-07-12 ENCOUNTER — Ambulatory Visit (INDEPENDENT_AMBULATORY_CARE_PROVIDER_SITE_OTHER): Admitting: Nurse Practitioner

## 2018-07-12 ENCOUNTER — Encounter: Payer: Self-pay | Admitting: Nurse Practitioner

## 2018-07-12 ENCOUNTER — Other Ambulatory Visit: Payer: Self-pay | Admitting: Nurse Practitioner

## 2018-07-12 VITALS — BP 106/70 | HR 78 | Ht 67.0 in | Wt 226.0 lb

## 2018-07-12 DIAGNOSIS — I10 Essential (primary) hypertension: Secondary | ICD-10-CM | POA: Diagnosis not present

## 2018-07-12 DIAGNOSIS — E119 Type 2 diabetes mellitus without complications: Secondary | ICD-10-CM

## 2018-07-12 DIAGNOSIS — M79641 Pain in right hand: Secondary | ICD-10-CM

## 2018-07-12 DIAGNOSIS — G8929 Other chronic pain: Secondary | ICD-10-CM

## 2018-07-12 DIAGNOSIS — M545 Low back pain, unspecified: Secondary | ICD-10-CM | POA: Insufficient documentation

## 2018-07-12 LAB — COMPREHENSIVE METABOLIC PANEL
ALT: 7 U/L (ref 0–35)
AST: 8 U/L (ref 0–37)
Albumin: 3.9 g/dL (ref 3.5–5.2)
Alkaline Phosphatase: 72 U/L (ref 39–117)
BUN: 11 mg/dL (ref 6–23)
CO2: 28 mEq/L (ref 19–32)
Calcium: 9.2 mg/dL (ref 8.4–10.5)
Chloride: 102 mEq/L (ref 96–112)
Creatinine, Ser: 0.87 mg/dL (ref 0.40–1.20)
GFR: 86.09 mL/min (ref >60.00)
Glucose, Bld: 220 mg/dL — ABNORMAL HIGH (ref 70–99)
Potassium: 4 mEq/L (ref 3.5–5.1)
Sodium: 137 mEq/L (ref 135–145)
Total Bilirubin: 0.3 mg/dL (ref 0.2–1.2)
Total Protein: 7 g/dL (ref 6.0–8.3)

## 2018-07-12 LAB — LIPID PANEL
Cholesterol: 97 mg/dL (ref 0–200)
HDL: 50.7 mg/dL (ref >39.00)
LDL Cholesterol: 24 mg/dL (ref 0–99)
NonHDL: 46.57
Total CHOL/HDL Ratio: 2
Triglycerides: 114 mg/dL (ref 0.0–149.0)
VLDL: 22.8 mg/dL (ref 0.0–40.0)

## 2018-07-12 LAB — POCT GLYCOSYLATED HEMOGLOBIN (HGB A1C): Hemoglobin A1C: 7.9 % — AB (ref 4.0–5.6)

## 2018-07-12 MED ORDER — ATORVASTATIN CALCIUM 10 MG PO TABS: 10.0000 mg | ORAL_TABLET | Freq: Every day | ORAL | 1 refills | Status: DC

## 2018-07-12 MED ORDER — NAPROXEN 375 MG PO TABS: 375.0000 mg | ORAL_TABLET | Freq: Two times a day (BID) | ORAL | 0 refills | Status: DC

## 2018-07-12 MED ORDER — TRAMADOL HCL 50 MG PO TABS: 50.0000 mg | ORAL_TABLET | Freq: Four times a day (QID) | ORAL | 0 refills | Status: DC | PRN

## 2018-07-12 MED ORDER — METHOCARBAMOL 500 MG PO TABS: 500.0000 mg | ORAL_TABLET | Freq: Three times a day (TID) | ORAL | 0 refills | Status: DC | PRN

## 2018-07-12 MED ORDER — AMLODIPINE BESYLATE 10 MG PO TABS: 10.0000 mg | ORAL_TABLET | Freq: Every day | ORAL | 0 refills | Status: DC

## 2018-07-12 NOTE — Patient Instructions (Signed)
Please head downstairs for labs and xrays  Please follow up with sports medicine for your back pain  I will see you back in 3 months    Diabetes Mellitus and Nutrition, Adult When you have diabetes (diabetes mellitus), it is very important to have healthy eating habits because your blood sugar (glucose) levels are greatly affected by what you eat and drink. Eating healthy foods in the appropriate amounts, at about the same times every day, can help you:  Control your blood glucose.  Lower your risk of heart disease.  Improve your blood pressure.  Reach or maintain a healthy weight. Every person with diabetes is different, and each person has different needs for a meal plan. Your health care provider may recommend that you work with a diet and nutrition specialist (dietitian) to make a meal plan that is best for you. Your meal plan may vary depending on factors such as:  The calories you need.  The medicines you take.  Your weight.  Your blood glucose, blood pressure, and cholesterol levels.  Your activity level.  Other health conditions you have, such as heart or kidney disease. How do carbohydrates affect me? Carbohydrates, also called carbs, affect your blood glucose level more than any other type of food. Eating carbs naturally raises the amount of glucose in your blood. Carb counting is a method for keeping track of how many carbs you eat. Counting carbs is important to keep your blood glucose at a healthy level, especially if you use insulin or take certain oral diabetes medicines. It is important to know how many carbs you can safely have in each meal. This is different for every person. Your dietitian can help you calculate how many carbs you should have at each meal and for each snack. Foods that contain carbs include:  Bread, cereal, rice, pasta, and crackers.  Potatoes and corn.  Peas, beans, and lentils.  Milk and yogurt.  Fruit and juice.  Desserts, such as  cakes, cookies, ice cream, and candy. How does alcohol affect me? Alcohol can cause a sudden decrease in blood glucose (hypoglycemia), especially if you use insulin or take certain oral diabetes medicines. Hypoglycemia can be a life-threatening condition. Symptoms of hypoglycemia (sleepiness, dizziness, and confusion) are similar to symptoms of having too much alcohol. If your health care provider says that alcohol is safe for you, follow these guidelines:  Limit alcohol intake to no more than 1 drink per day for nonpregnant women and 2 drinks per day for men. One drink equals 12 oz of beer, 5 oz of wine, or 1 oz of hard liquor.  Do not drink on an empty stomach.  Keep yourself hydrated with water, diet soda, or unsweetened iced tea.  Keep in mind that regular soda, juice, and other mixers may contain a lot of sugar and must be counted as carbs. What are tips for following this plan?  Reading food labels  Start by checking the serving size on the "Nutrition Facts" label of packaged foods and drinks. The amount of calories, carbs, fats, and other nutrients listed on the label is based on one serving of the item. Many items contain more than one serving per package.  Check the total grams (g) of carbs in one serving. You can calculate the number of servings of carbs in one serving by dividing the total carbs by 15. For example, if a food has 30 g of total carbs, it would be equal to 2 servings of carbs.  Check the number of grams (g) of saturated and trans fats in one serving. Choose foods that have low or no amount of these fats.  Check the number of milligrams (mg) of salt (sodium) in one serving. Most people should limit total sodium intake to less than 2,300 mg per day.  Always check the nutrition information of foods labeled as "low-fat" or "nonfat". These foods may be higher in added sugar or refined carbs and should be avoided.  Talk to your dietitian to identify your daily goals for  nutrients listed on the label. Shopping  Avoid buying canned, premade, or processed foods. These foods tend to be high in fat, sodium, and added sugar.  Shop around the outside edge of the grocery store. This includes fresh fruits and vegetables, bulk grains, fresh meats, and fresh dairy. Cooking  Use low-heat cooking methods, such as baking, instead of high-heat cooking methods like deep frying.  Cook using healthy oils, such as olive, canola, or sunflower oil.  Avoid cooking with butter, cream, or high-fat meats. Meal planning  Eat meals and snacks regularly, preferably at the same times every day. Avoid going long periods of time without eating.  Eat foods high in fiber, such as fresh fruits, vegetables, beans, and whole grains. Talk to your dietitian about how many servings of carbs you can eat at each meal.  Eat 4-6 ounces (oz) of lean protein each day, such as lean meat, chicken, fish, eggs, or tofu. One oz of lean protein is equal to: ? 1 oz of meat, chicken, or fish. ? 1 egg. ?  cup of tofu.  Eat some foods each day that contain healthy fats, such as avocado, nuts, seeds, and fish. Lifestyle  Check your blood glucose regularly.  Exercise regularly as told by your health care provider. This may include: ? 150 minutes of moderate-intensity or vigorous-intensity exercise each week. This could be brisk walking, biking, or water aerobics. ? Stretching and doing strength exercises, such as yoga or weightlifting, at least 2 times a week.  Take medicines as told by your health care provider.  Do not use any products that contain nicotine or tobacco, such as cigarettes and e-cigarettes. If you need help quitting, ask your health care provider.  Work with a Veterinary surgeon or diabetes educator to identify strategies to manage stress and any emotional and social challenges. Questions to ask a health care provider  Do I need to meet with a diabetes educator?  Do I need to meet with a  dietitian?  What number can I call if I have questions?  When are the best times to check my blood glucose? Where to find more information:  American Diabetes Association: diabetes.org  Academy of Nutrition and Dietetics: www.eatright.[employer] of Diabetes and Digestive and Kidney Diseases (NIH): CarFlippers.tn Summary  A healthy meal plan will help you control your blood glucose and maintain a healthy lifestyle.  Working with a diet and nutrition specialist (dietitian) can help you make a meal plan that is best for you.  Keep in mind that carbohydrates (carbs) and alcohol have immediate effects on your blood glucose levels. It is important to count carbs and to use alcohol carefully. This information is not intended to replace advice given to you by your health care provider. Make sure you discuss any questions you have with your health care provider. Document Released: 03/20/2005 Document Revised: 01/21/2017 Document Reviewed: 07/28/2016 Elsevier Interactive Patient Education  2019 [employer].

## 2018-07-12 NOTE — Progress Notes (Signed)
Stephanie Joseph is a 58 y.o. female with the following history as recorded in Epic[employer]:  Patient Active Problem List   Diagnosis Date Noted  . Acute pharyngitis 09/16/2017  . Abnormal TSH 09/16/2017  . Glaucoma 05/21/2017  . Tooth pain 04/28/2017  . Encounter for general adult medical examination with abnormal findings 09/19/2016  . Class 1 obesity due to excess calories with serious comorbidity and body mass index (BMI) of 33.0 to 33.9 in adult 09/19/2016  . Vitamin D deficiency 09/19/2016  . Urinary frequency 02/14/2016  . Back pain 10/18/2015  . Type 2 diabetes mellitus (HCC) 07/10/2015  . Hormone replacement therapy (postmenopausal) 10/04/2014  . Mitral regurgitation 11/05/2012  . Aortic valve disease 10/21/2011  . Menorrhagia, premenopausal 09/01/2011  . Depression 03/04/2010  . GOUT 12/16/2006  . ANEMIA, IRON DEFICIENCY NOS 12/16/2006  . TOBACCO ABUSE 12/16/2006  . Essential hypertension 12/16/2006    Current Outpatient Medications  Medication Sig Dispense Refill  . amLODipine (NORVASC) 10 MG tablet Take 1 tablet (10 mg total) by mouth daily. 90 tablet 0  . atorvastatin (LIPITOR) 10 MG tablet Take 1 tablet (10 mg total) by mouth daily. 30 tablet 1  . brimonidine-timolol (COMBIGAN) 0.2-0.5 % ophthalmic solution Place 1 drop into both eyes every 12 (twelve) hours.    Marland Kitchen estradiol (ESTRACE) 1 MG tablet Take 1 tablet (1 mg total) by mouth daily. 30 tablet 5  . glucose blood (ACCU-CHEK AVIVA) test strip Use to check blood sugars 1-3 times daily. Dx Code: E11.9 300 each 1  . hydrOXYzine (ATARAX/VISTARIL) 50 MG tablet Take 50 mg by mouth 3 (three) times daily as needed for itching.    . Lancets (ACCU-CHEK SOFT TOUCH) lancets Use as instructed 100 each 12  . losartan-hydrochlorothiazide (HYZAAR) 100-12.5 MG tablet Take 1 tablet by mouth daily. 90 tablet 1  . medroxyPROGESTERone (PROVERA) 5 MG tablet Take 1 tablet (5 mg total) by mouth daily. 30 tablet 5  . medroxyPROGESTERone  (PROVERA) 5 MG tablet Take 1 tablet (5 mg total) by mouth daily. 30 tablet 7  . metFORMIN (GLUCOPHAGE XR) 500 MG 24 hr tablet Take 2 tablets (1,000 mg total) by mouth 2 (two) times daily. 180 tablet 2  . methocarbamol (ROBAXIN) 500 MG tablet Take 1 tablet (500 mg total) by mouth every 8 (eight) hours as needed for muscle spasms. 90 tablet 0  . Misc. Devices (CANE) MISC Use as needed for left knee pain. 1 each 0  . naproxen (NAPROSYN) 375 MG tablet Take 1 tablet (375 mg total) by mouth 2 (two) times daily. 60 tablet 0  . oxybutynin (DITROPAN-XL) 5 MG 24 hr tablet Take 1 tablet (5 mg total) by mouth at bedtime. 30 tablet 6  . QUEtiapine (SEROQUEL) 50 MG tablet Take 50 mg by mouth at bedtime.    . traMADol (ULTRAM) 50 MG tablet Take 1 tablet (50 mg total) by mouth every 6 (six) hours as needed. 30 tablet 0  . venlafaxine (EFFEXOR) 75 MG tablet Take 75 mg by mouth 3 (three) times daily with meals.     No current facility-administered medications for this visit.     Allergies: Patient has no known allergies.  Past Medical History:  Diagnosis Date  . Anemia, iron deficiency   . Arthritis   . Depression   . Diabetes mellitus without complication (HCC)   . Dysmenorrhea   . Gout   . Heart valve problem   . Hypertension   . Menorrhagia   . Mitral regurgitation   .  Pneumonia     Past Surgical History:  Procedure Laterality Date  . KNEE ARTHROSCOPY     Left    Family History  Problem Relation Age of Onset  . Hypertension Mother   . Stroke Paternal Grandmother   . Heart disease Sister   . Heart disease Brother     Social History   Tobacco Use  . Smoking status: Current Every Day Smoker    Packs/day: 0.50    Years: 7.00    Pack years: 3.50    Types: Cigarettes  . Smokeless tobacco: Never Used  Substance Use Topics  . Alcohol use: No     Subjective:  Stephanie Joseph is here today for diabetes follow up, she is also requesting several medication refills and would like to discuss  right hand pain.  Diabetes- maintained on metformin, which was increased to 1000 BID after A1c of 8.2 on 03/05/18.   Reports she has increased metformin dosage as instructed and tolerating well, although she has been forgetting some doses here and there. Denies tremor, diaphoresis, polyuria, polydipsia, polyphagia.  Lab Results  Component Value Date   HGBA1C 7.9 (A) 07/12/2018   She was also started on lipitor 10 daily on 03/05/18 due to diabetes, ASCVD risk with a normal lipid panel, She says she has been taking the medication daily and is tolerating well.  Requesting refills of tramadol, naproxen, robaxin, which she takes PRN/daily for back pain- tramadol 1 pill about every other day, naproxen twice daily, robaxin once daily for her back pain. She saw SM for her back pain in September but did not return for follow up.  Right hand pain x 1 month. Describes as "aching in my knuckles" Hard to open jars, feels like her hand gets cramped The pain is every day No injuries, swelling, skin discoloration, numbness or tingling.   ROS- See HPI  Objective:  Vitals:   07/12/18 0854  BP: 106/70  Pulse: 78  SpO2: 98%  Weight: 226 lb (102.5 kg)  Height: 5\' 7"  (1.702 m)    General: Well developed, well nourished, in no acute distress  Skin : Warm and dry. No erythema, rash. Head: Normocephalic and atraumatic  Eyes: Sclera and conjunctiva clear; pupils round and reactive to light; extraocular movements intact  Oropharynx: Pink, supple. No suspicious lesions  Neck: Supple without thyromegaly, adenopathy  Lungs: Respirations unlabored; clear to auscultation bilaterally without wheeze, rales, rhonchi  CVS exam: normal rate and regular rhythm, S1 and S2 normal.   Musculoskeletal: No deformities; no active joint inflammation. Right hand- normal ROM, no weakness or swelling.  Extremities: No edema, cyanosis, clubbing  Vessels: Symmetric bilaterally  Neurologic: Alert and oriented; speech intact;  face symmetrical; moves all extremities well; CNII-XII intact without focal deficit  Psychiatric: Normal mood and affect.   Assessment:  1. Type 2 diabetes mellitus without complication, without long-term current use of insulin (HCC)   2. Chronic midline low back pain   3. Right hand pain   4. Essential hypertension     Plan:   Right hand pain Update imaging F/U with further recommendations pending  results - DG Hand Complete Right; Future   Return in about 3 months (around 10/11/2018) for diabetes follow up-recheck a1c.  Orders Placed This Encounter  Procedures  . DG Hand Complete Right    Standing Status:   Future    Standing Expiration Date:   09/10/2019    Order Specific Question:   Reason for Exam (SYMPTOM  OR DIAGNOSIS  REQUIRED)    Answer:   hand pain    Order Specific Question:   Is patient pregnant?    Answer:   No    Order Specific Question:   Preferred imaging location?    Answer:   [address]    Order Specific Question:   Radiology Contrast Protocol - do NOT remove file path    Answer:   [email or web address]  . Comprehensive metabolic panel    Standing Status:   Future    Standing Expiration Date:   07/13/2019  . Lipid panel    Standing Status:   Future    Standing Expiration Date:   07/13/2019  . POCT glycosylated hemoglobin (Hb A1C)    Requested Prescriptions   Signed Prescriptions Disp Refills  . atorvastatin (LIPITOR) 10 MG tablet 30 tablet 1    Sig: Take 1 tablet (10 mg total) by mouth daily.  . methocarbamol (ROBAXIN) 500 MG tablet 90 tablet 0    Sig: Take 1 tablet (500 mg total) by mouth every 8 (eight) hours as needed for muscle spasms.  . traMADol (ULTRAM) 50 MG tablet 30 tablet 0    Sig: Take 1 tablet (50 mg total) by mouth every 6 (six) hours as needed.  Marland Kitchen. amLODipine (NORVASC) 10 MG tablet 90 tablet 0    Sig: Take 1 tablet (10 mg total) by mouth daily.  . naproxen (NAPROSYN) 375 MG tablet 60 tablet 0    Sig:  Take 1 tablet (375 mg total) by mouth 2 (two) times daily.

## 2018-07-12 NOTE — Assessment & Plan Note (Addendum)
A1c remains elevated with reported medication noncompliance We discussed the importance of daily med compliance in the management of DM, she was instructed to resume medications daily as prescribed and return in 3 months for repeat A1c, could consider additional medication options if she continues to have trouble remembering metformin doses Declined nutrition referral Home management, healthy diet discussed and printed on AVS Will update labs after starting statin today- she is fasting - POCT glycosylated hemoglobin (Hb A1C) - atorvastatin (LIPITOR) 10 MG tablet; Take 1 tablet (10 mg total) by mouth daily.  Dispense: 30 tablet; Refill: 1 - Comprehensive metabolic panel; Future - Lipid panel; Future

## 2018-07-12 NOTE — Assessment & Plan Note (Signed)
Discussed long term risks of tramadol, naproxen daily including CV, GI risks, addiction, she would like to continue medications at this time Per her sports med chart, trigger point injections could be an option for her chronic back pain and I encouraged her to follow back up with sports med as instructed for further treatment options Controlled substance registry reviewed with no irregularities. - methocarbamol (ROBAXIN) 500 MG tablet; Take 1 tablet (500 mg total) by mouth every 8 (eight) hours as needed for muscle spasms.  Dispense: 90 tablet; Refill: 0 - traMADol (ULTRAM) 50 MG tablet; Take 1 tablet (50 mg total) by mouth every 6 (six) hours as needed.  Dispense: 30 tablet; Refill: 0 - naproxen (NAPROSYN) 375 MG tablet; Take 1 tablet (375 mg total) by mouth 2 (two) times daily.  Dispense: 60 tablet; Refill: 0

## 2018-07-12 NOTE — Assessment & Plan Note (Signed)
Stable Continue current medications - amLODipine (NORVASC) 10 MG tablet; Take 1 tablet (10 mg total) by mouth daily.  Dispense: 90 tablet; Refill: 0

## 2018-07-21 ENCOUNTER — Ambulatory Visit (INDEPENDENT_AMBULATORY_CARE_PROVIDER_SITE_OTHER): Admitting: Orthopaedic Surgery

## 2018-07-21 ENCOUNTER — Encounter: Payer: Self-pay | Admitting: *Deleted

## 2018-07-21 ENCOUNTER — Encounter (INDEPENDENT_AMBULATORY_CARE_PROVIDER_SITE_OTHER): Payer: Self-pay | Admitting: Orthopaedic Surgery

## 2018-07-21 DIAGNOSIS — M79641 Pain in right hand: Secondary | ICD-10-CM | POA: Diagnosis not present

## 2018-07-21 MED ORDER — DICLOFENAC SODIUM 1 % TD GEL: 2.0000 g | Freq: Four times a day (QID) | TRANSDERMAL | 3 refills | Status: DC

## 2018-07-21 NOTE — Progress Notes (Signed)
Office Visit Note   Patient: Stephanie Joseph           Date of Birth: 1960/07/29           MRN: [id number] Visit Date: 07/21/2018              Requested by: Evaristo BuryShambley, [name] N, NP [address] [place] [id number] PCP: Evaristo BuryShambley, [name] N, NP   Assessment & Plan: Visit Diagnoses:  1. Pain in right hand     Plan: I gave her reassurance this is likely an inflammatory or mild arthritic process that should run its course.  I do feel that she would benefit from a combination of Aleve and Tylenol arthritis.  We will also send in some Voltaren gel.  All question concerns were answered and addressed.  Follow-up will be as needed.  Follow-Up Instructions: Return if symptoms worsen or fail to improve.   Orders:  No orders of the defined types were placed in this encounter.  Meds ordered this encounter  Medications  . diclofenac sodium (VOLTAREN) 1 % GEL    Sig: Apply 2 g topically 4 (four) times daily.    Dispense:  100 g    Refill:  3      Procedures: No procedures performed   Clinical Data: No additional findings.   Subjective: Chief Complaint  Patient presents with  . Right Hand - Pain  The patient comes in today for evaluation treatment of right hand pain.  This was a referral from her PCP.  She reports about a month or more worth of right dominant hand pain and she points to the index and middle finger MCP joints is a source of her pain.  She denies any specific injuries.  She does not work.  She denies any numbness and tingling in her hand.  It does wake her up at night and has been worse in the morning that is during the regular day.  She denies any triggering or locking catching.  She is a diabetic and does say that her blood glucose some days runs 150-160.  HPI  Review of Systems She currently denies any headache, chest pain, shortness of breath, fever, chills, nausea, vomiting.  She denies any issues with other joints in her hands or  other aspects of her body.  Objective: Vital Signs: LMP 02/13/2014 (Within Days)   Physical Exam She is alert and oriented x3 and in no acute distress Ortho Exam Examination of her right hand shows no numbness and tingling.  There is no muscle atrophy.  The range of motion of all her joints is normal.  There is no evidence of triggering.  She has pain over the MCP joint of her index and middle fingers but no swelling.  Her flexion extension is full. Specialty Comments:  No specialty comments available.  Imaging: No results found. 3 views independently reviewed of her right hand on the canopy system show no acute findings and no significant arthritic changes.  PMFS History: Patient Active Problem List   Diagnosis Date Noted  . Chronic midline low back pain 07/12/2018  . Acute pharyngitis 09/16/2017  . Abnormal TSH 09/16/2017  . Glaucoma 05/21/2017  . Tooth pain 04/28/2017  . Encounter for general adult medical examination with abnormal findings 09/19/2016  . Class 1 obesity due to excess calories with serious comorbidity and body mass index (BMI) of 33.0 to 33.9 in adult 09/19/2016  . Vitamin D deficiency 09/19/2016  . Urinary frequency  02/14/2016  . Back pain 10/18/2015  . Type 2 diabetes mellitus (HCC) 07/10/2015  . Hormone replacement therapy (postmenopausal) 10/04/2014  . Mitral regurgitation 11/05/2012  . Aortic valve disease 10/21/2011  . Menorrhagia, premenopausal 09/01/2011  . Depression 03/04/2010  . GOUT 12/16/2006  . ANEMIA, IRON DEFICIENCY NOS 12/16/2006  . TOBACCO ABUSE 12/16/2006  . Essential hypertension 12/16/2006   Past Medical History:  Diagnosis Date  . Anemia, iron deficiency   . Arthritis   . Depression   . Diabetes mellitus without complication (HCC)   . Dysmenorrhea   . Gout   . Heart valve problem   . Hypertension   . Menorrhagia   . Mitral regurgitation   . Pneumonia     Family History  Problem Relation Age of Onset  . Hypertension  Mother   . Stroke Paternal Grandmother   . Heart disease Sister   . Heart disease Brother     Past Surgical History:  Procedure Laterality Date  . KNEE ARTHROSCOPY     Left   Social History   Occupational History  . Occupation: Disability Pending  Tobacco Use  . Smoking status: Current Every Day Smoker    Packs/day: 0.50    Years: 7.00    Pack years: 3.50    Types: Cigarettes  . Smokeless tobacco: Never Used  Substance and Sexual Activity  . Alcohol use: No  . Drug use: No    Types: "Crack" cocaine  . Sexual activity: Yes    Birth control/protection: None

## 2018-08-01 NOTE — Progress Notes (Signed)
Patient ID: Stephanie Joseph, female   DOB: 1961/02/18, 58 y.o.   MRN: [id number]      58 y.o. followed for murmur since 2015. Last echo done 12/17/17 showed mild AR/MR with EF 50-55% CRF;s Include DM, HLD and HTN.   Husband doing better not running off and using crack  No cardiac symptoms   ROS: Denies fever, malais, weight loss, blurry vision, decreased visual acuity, cough, sputum, SOB, hemoptysis, pleuritic pain, palpitaitons, heartburn, abdominal pain, melena, lower extremity edema, claudication, or rash.  All other systems reviewed and negative  General: BP 112/78   Pulse 72   Ht 5\' 7"  (1.702 m)   Wt 225 lb 12.8 oz (102.4 kg)   LMP 02/13/2014 (Within Days)   SpO2 98%   BMI 35.37 kg/m  Affect appropriate Healthy:  appears stated age HEENT: normal Neck supple with no adenopathy JVP normal no bruits no thyromegaly Lungs clear with no wheezing and good diaphragmatic motion Heart:  S1/S2 AR murmur , no rub, gallop or click PMI normal Abdomen: benighn, BS positve, no tenderness, no AAA no bruit.  No HSM or HJR Distal pulses intact with no bruits No edema Neuro non-focal Skin warm and dry No muscular weakness    Current Outpatient Medications  Medication Sig Dispense Refill  . amLODipine (NORVASC) 10 MG tablet Take 1 tablet (10 mg total) by mouth daily. 90 tablet 0  . atorvastatin (LIPITOR) 10 MG tablet Take 1 tablet (10 mg total) by mouth daily. 30 tablet 1  . brimonidine-timolol (COMBIGAN) 0.2-0.5 % ophthalmic solution Place 1 drop into both eyes every 12 (twelve) hours.    . diclofenac sodium (VOLTAREN) 1 % GEL Apply 2 g topically 4 (four) times daily. 100 g 3  . estradiol (ESTRACE) 1 MG tablet Take 1 tablet (1 mg total) by mouth daily. 30 tablet 5  . glucose blood (ACCU-CHEK AVIVA) test strip Use to check blood sugars 1-3 times daily. Dx Code: E11.9 300 each 1  . hydrOXYzine (ATARAX/VISTARIL) 50 MG tablet Take 50 mg by mouth 3 (three) times daily as needed for itching.     . Lancets (ACCU-CHEK SOFT TOUCH) lancets Use as instructed 100 each 12  . losartan-hydrochlorothiazide (HYZAAR) 100-12.5 MG tablet Take 1 tablet by mouth daily. 90 tablet 1  . medroxyPROGESTERone (PROVERA) 5 MG tablet Take 1 tablet (5 mg total) by mouth daily. 30 tablet 5  . metFORMIN (GLUCOPHAGE XR) 500 MG 24 hr tablet Take 2 tablets (1,000 mg total) by mouth 2 (two) times daily. 180 tablet 2  . methocarbamol (ROBAXIN) 500 MG tablet Take 1 tablet (500 mg total) by mouth every 8 (eight) hours as needed for muscle spasms. 90 tablet 0  . Misc. Devices (CANE) MISC Use as needed for left knee pain. 1 each 0  . naproxen (NAPROSYN) 375 MG tablet Take 1 tablet (375 mg total) by mouth 2 (two) times daily. 60 tablet 0  . oxybutynin (DITROPAN-XL) 5 MG 24 hr tablet Take 1 tablet (5 mg total) by mouth at bedtime. 30 tablet 6  . QUEtiapine (SEROQUEL) 50 MG tablet Take 50 mg by mouth at bedtime.    . traMADol (ULTRAM) 50 MG tablet Take 1 tablet (50 mg total) by mouth every 6 (six) hours as needed. 30 tablet 0  . venlafaxine (EFFEXOR) 75 MG tablet Take 75 mg by mouth 3 (three) times daily with meals.     No current facility-administered medications for this visit.     Allergies  Patient has no known  allergies.  Electrocardiogram:  5/15  SR rate 67 normal  07/27/15  SR rate 73  Normal  08/04/17 SR rate 67 normal  Assessment and Plan  MR /AR no change in murmur   TTE 12/17/17 mild AR and MR no need for SBE   Bipolar: stable continue current meds ZO:[id number]:Discussed low carb diet.  Target hemoglobin A1c is 6.5 or less.  Continue current medications. HTN: Well controlled.  Continue current medications and low sodium Dash type diet.   HLD:  Continue statin labs with primary   F/u with me in a year echo June 2020   Charlton HawsPeter Floride Hutmacher

## 2018-08-04 ENCOUNTER — Encounter: Payer: Self-pay | Admitting: Cardiovascular Disease

## 2018-08-04 ENCOUNTER — Ambulatory Visit (INDEPENDENT_AMBULATORY_CARE_PROVIDER_SITE_OTHER): Admitting: Cardiovascular Disease

## 2018-08-04 VITALS — BP 112/78 | HR 72 | Ht 67.0 in | Wt 225.8 lb

## 2018-08-04 DIAGNOSIS — I340 Nonrheumatic mitral (valve) insufficiency: Secondary | ICD-10-CM

## 2018-08-04 DIAGNOSIS — I10 Essential (primary) hypertension: Secondary | ICD-10-CM | POA: Diagnosis not present

## 2018-08-04 DIAGNOSIS — I351 Nonrheumatic aortic (valve) insufficiency: Secondary | ICD-10-CM | POA: Diagnosis not present

## 2018-08-04 NOTE — Patient Instructions (Addendum)
Medication Instructions:  Your physician recommends that you continue on your current medications as directed. Please refer to the Current Medication list given to you today.  Labwork: NONE  Testing/Procedures: Your physician has requested that you have an echocardiogram in one year with office visit. Echocardiography is a painless test that uses sound waves to create images of your heart. It provides your doctor with information about the size and shape of your heart and how well your heart's chambers and valves are working. This procedure takes approximately one hour. There are no restrictions for this procedure.  Follow-Up: Your physician wants you to follow-up in: 12 months with Dr. Nishan. You will receive a reminder letter in the mail two months in advance. If you don't receive a letter, please call our office to schedule the follow-up appointment.   If you need a refill on your cardiac medications before your next appointment, please call your pharmacy.    

## 2018-08-25 ENCOUNTER — Other Ambulatory Visit: Payer: Self-pay | Admitting: Obstetrics & Gynecology

## 2018-08-25 DIAGNOSIS — Z780 Asymptomatic menopausal state: Secondary | ICD-10-CM

## 2018-09-06 ENCOUNTER — Ambulatory Visit: Admitting: Nurse Practitioner

## 2018-09-09 LAB — HM DIABETES EYE EXAM
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
## 2018-09-13 ENCOUNTER — Encounter: Payer: Self-pay | Admitting: Nurse Practitioner

## 2018-09-24 ENCOUNTER — Other Ambulatory Visit: Payer: Self-pay

## 2018-09-24 ENCOUNTER — Encounter: Payer: Self-pay | Admitting: Podiatry

## 2018-09-24 ENCOUNTER — Ambulatory Visit (INDEPENDENT_AMBULATORY_CARE_PROVIDER_SITE_OTHER)

## 2018-09-24 ENCOUNTER — Ambulatory Visit (INDEPENDENT_AMBULATORY_CARE_PROVIDER_SITE_OTHER): Admitting: Podiatry

## 2018-09-24 DIAGNOSIS — E119 Type 2 diabetes mellitus without complications: Secondary | ICD-10-CM

## 2018-09-24 DIAGNOSIS — T148XXA Other injury of unspecified body region, initial encounter: Secondary | ICD-10-CM

## 2018-09-24 DIAGNOSIS — M7741 Metatarsalgia, right foot: Secondary | ICD-10-CM | POA: Diagnosis not present

## 2018-09-24 DIAGNOSIS — M79671 Pain in right foot: Secondary | ICD-10-CM

## 2018-09-24 NOTE — Progress Notes (Signed)
This patient presents the office with chief complaint of pain in h her right foot.  She says she has been experiencing pain for the last 6 weeks.  She states she experiences pain walking and wearing her shoes through the top of her right foot.  She denies any history of trauma or injury to the right foot.  She says she is not having any swelling or redness on the top of her right foot.  She has provided no self treatment nor sought any professional help.  She presents the office today for an evaluation and treatment of her painful right foot.  Patient has history of right foot surgery.  Patient is diabetic.  General Appearance  Alert, conversant and in no acute stress.  Vascular  Dorsalis pedis  pulses are palpable  bilaterally. Weakly palpable posterior tibial pulses bilateral. Capillary return is within normal limits  bilaterally. Temperature is within normal limits  bilaterally.  Neurologic  Senn-Weinstein monofilament wire test within normal limits  bilaterally. Muscle power within normal limits bilaterally.  Nails Thick disfigured discolored nails with subungual debris  from hallux to fifth toes bilaterally. No evidence of bacterial infection or drainage bilaterally.  Orthopedic  No limitations of motion  feet .  No crepitus or effusions noted.  HAV 1st MPJ  Left foot.  Hammer toes 2-5 left foot.  Palpable pain noted through the shaft of the second third and fourth metatarsals right foot.  No evidence of any redness swelling or increased temperature  Skin  normotropic skin  noted bilaterally.  No signs of infections or ulcers noted.  Asymptomatic callus  B/l.  Metatarsalgia right foot  ROV.  X-rays taken of the right foot revealing no evidence of any bony pathology in the presence of the K wire in the first metatarsal.  Discussed this condition with this patient.  Told her she needs to wear better supportive shoes and even consider power step insoles in the future to help to support her right  foot.  RTC prn.   Helane Gunther DPM

## 2018-10-05 ENCOUNTER — Other Ambulatory Visit: Payer: Self-pay | Admitting: Obstetrics & Gynecology

## 2018-10-05 DIAGNOSIS — Z780 Asymptomatic menopausal state: Secondary | ICD-10-CM

## 2018-10-12 ENCOUNTER — Ambulatory Visit: Admitting: Obstetrics & Gynecology

## 2018-10-12 ENCOUNTER — Encounter: Payer: Self-pay | Admitting: Obstetrics & Gynecology

## 2018-10-12 ENCOUNTER — Ambulatory Visit (INDEPENDENT_AMBULATORY_CARE_PROVIDER_SITE_OTHER): Admitting: Obstetrics & Gynecology

## 2018-10-12 ENCOUNTER — Other Ambulatory Visit: Payer: Self-pay

## 2018-10-12 DIAGNOSIS — Z79890 Hormone replacement therapy (postmenopausal): Secondary | ICD-10-CM

## 2018-10-12 DIAGNOSIS — Z780 Asymptomatic menopausal state: Secondary | ICD-10-CM

## 2018-10-12 MED ORDER — MEDROXYPROGESTERONE ACETATE 5 MG PO TABS: 5.0000 mg | ORAL_TABLET | Freq: Every day | ORAL | 3 refills | Status: DC

## 2018-10-12 MED ORDER — ESTRADIOL 1 MG PO TABS: 1.0000 mg | ORAL_TABLET | Freq: Every day | ORAL | 3 refills | Status: DC

## 2018-10-12 NOTE — Patient Instructions (Signed)

## 2018-10-12 NOTE — Progress Notes (Signed)
Pt Annual was pushed back due to COVID-19 needs to discuss medication management for Provera and Estradiol.  Pt has no complaints

## 2018-10-12 NOTE — Progress Notes (Signed)
   TELEHEALTH VIRTUAL GYNECOLOGY VISIT ENCOUNTER NOTE  I connected with Stephanie Joseph on 10/12/18 at  9:45 AM EDT by telephone at home and verified that I am speaking with the correct person using two identifiers.   I discussed the limitations, risks, security and privacy concerns of performing an evaluation and management service by telephone and the availability of in person appointments. I also discussed with the patient that there may be a patient responsible charge related to this service. The patient expressed understanding and agreed to proceed.   History:  Stephanie Joseph is a 58 y.o. [phone] female being evaluated today for continuation of her postmenopausal HRT. She denies any abnormal vaginal discharge, bleeding, pelvic pain or other concerns.  No VMS and she wishes to continue her HRT for the next year     Past Medical History:  Diagnosis Date  . Anemia, iron deficiency   . Arthritis   . Depression   . Diabetes mellitus without complication (HCC)   . Dysmenorrhea   . Gout   . Heart valve problem   . Hypertension   . Menorrhagia   . Mitral regurgitation   . Pneumonia    Past Surgical History:  Procedure Laterality Date  . KNEE ARTHROSCOPY     Left   The following portions of the patient's history were reviewed and updated as appropriate: allergies, current medications, past family history, past medical history, past social history, past surgical history and problem list.   Health Maintenance:  Normal pap and negative HRHPV on 09/2014.  Normal mammogram on 04/2018.   Review of Systems:  Pertinent items noted in HPI and remainder of comprehensive ROS otherwise negative.  Physical Exam:   General:  Alert, oriented and cooperative.   Mental Status: Normal mood and affect perceived. Normal judgment and thought content.  Physical exam deferred due to nature of the encounter  Labs and Imaging No results found for this or any previous visit (from the past 336 hour(s)).  Dg Foot Complete Right  Result Date: 09/24/2018 Please see detailed radiograph report in office note.     Assessment and Plan:     1. Postmenopausal HRT doing well - medroxyPROGESTERone (PROVERA) 5 MG tablet; Take 1 tablet (5 mg total) by mouth daily.  Dispense: 90 tablet; Refill: 3 - estradiol (ESTRACE) 1 MG tablet; Take 1 tablet (1 mg total) by mouth daily.  Dispense: 90 tablet; Refill: 3       I discussed the assessment and treatment plan with the patient. The patient was provided an opportunity to ask questions and all were answered. The patient agreed with the plan and demonstrated an understanding of the instructions.   The patient was advised to call back or seek an in-person evaluation/go to the ED if the symptoms worsen or if the condition fails to improve as anticipated.  I provided 10 minutes of non-face-to-face time during this encounter.   Scheryl Darter, MD [facility], [facility]

## 2018-10-13 ENCOUNTER — Ambulatory Visit: Admitting: Nurse Practitioner

## 2018-10-13 ENCOUNTER — Telehealth: Payer: Self-pay | Admitting: Nurse Practitioner

## 2018-10-13 NOTE — Telephone Encounter (Signed)
This is not our pt

## 2018-10-13 NOTE — Telephone Encounter (Signed)
Copied from CRM [id number]. Topic: Quick Communication - Rx Refill/Question >> Oct 13, 2018  1:17 PM Tamela Oddi wrote: Medication: amLODipine (NORVASC) 10 MG tablet / atorvastatin (LIPITOR) 10 MG tablet / losartan-hydrochlorothiazide (HYZAAR) 100-12.5 MG tablet  Patient called to request a refill for the above medications  Preferred Pharmacy (with phone number or street name): [facility] - [place], [place] - [address] [phone] (Phone) [phone] (Fax)

## 2018-10-28 ENCOUNTER — Telehealth: Payer: Self-pay | Admitting: Nurse Practitioner

## 2018-10-28 DIAGNOSIS — E119 Type 2 diabetes mellitus without complications: Secondary | ICD-10-CM

## 2018-10-28 DIAGNOSIS — I10 Essential (primary) hypertension: Secondary | ICD-10-CM

## 2018-10-28 MED ORDER — AMLODIPINE BESYLATE 10 MG PO TABS: 10.0000 mg | ORAL_TABLET | Freq: Every day | ORAL | 0 refills | Status: DC

## 2018-10-28 MED ORDER — LOSARTAN POTASSIUM-HCTZ 100-12.5 MG PO TABS: 1.0000 | ORAL_TABLET | Freq: Every day | ORAL | 0 refills | Status: DC

## 2018-10-28 MED ORDER — ATORVASTATIN CALCIUM 10 MG PO TABS: 10.0000 mg | ORAL_TABLET | Freq: Every day | ORAL | 0 refills | Status: DC

## 2018-10-28 NOTE — Addendum Note (Signed)
Addended by: Merrilyn Puma on: 10/28/2018 04:43 PM   Modules accepted: Orders

## 2018-10-28 NOTE — Telephone Encounter (Signed)
Copied from CRM [id number]. Topic: Quick Communication - Rx Refill/Question >> Oct 28, 2018  9:50 AM Tamela Oddi wrote: Medication: amLODipine (NORVASC) 10 MG tablet / atorvastatin (LIPITOR) 10 MG tablet / losartan-hydrochlorothiazide (HYZAAR) 100-12.5 MG tablet  Patient called to request a refill for the above medications.  Former patient of Dr. Aura Camps  Preferred Pharmacy (with phone number or street name): [facility] [address] [place], [place] - [address] [phone] (Phone) [phone] (Fax)

## 2018-10-28 NOTE — Telephone Encounter (Signed)
New Rxs sent to updated pharmacy. See meds.

## 2018-10-28 NOTE — Telephone Encounter (Signed)
Patient calling and states that these medications were sent to the wrong pharmacy. States that these medications need to go to [place].  [facility] - [place], [place] - [address]

## 2018-10-28 NOTE — Telephone Encounter (Signed)
Last saw Stephanie Joseph back in Jan. Refill med due for follow-up in July w/new provider.Marland KitchenRaechel Joseph

## 2018-11-03 ENCOUNTER — Ambulatory Visit: Admitting: Nurse Practitioner

## 2018-11-10 ENCOUNTER — Other Ambulatory Visit: Payer: Self-pay

## 2018-11-10 MED ORDER — LOSARTAN POTASSIUM-HCTZ 100-12.5 MG PO TABS: 1.0000 | ORAL_TABLET | Freq: Every day | ORAL | 0 refills | Status: DC

## 2018-11-17 ENCOUNTER — Ambulatory Visit: Admitting: Family

## 2018-11-17 ENCOUNTER — Ambulatory Visit: Admitting: Nurse Practitioner

## 2018-11-19 ENCOUNTER — Encounter: Payer: Self-pay | Admitting: Family

## 2018-11-19 ENCOUNTER — Ambulatory Visit (INDEPENDENT_AMBULATORY_CARE_PROVIDER_SITE_OTHER): Admitting: Family

## 2018-11-19 ENCOUNTER — Other Ambulatory Visit (INDEPENDENT_AMBULATORY_CARE_PROVIDER_SITE_OTHER)

## 2018-11-19 ENCOUNTER — Other Ambulatory Visit: Payer: Self-pay | Admitting: Family

## 2018-11-19 ENCOUNTER — Other Ambulatory Visit: Payer: Self-pay

## 2018-11-19 VITALS — BP 114/76 | HR 83 | Temp 98.1°F | Ht 67.0 in | Wt 223.1 lb

## 2018-11-19 DIAGNOSIS — Z23 Encounter for immunization: Secondary | ICD-10-CM

## 2018-11-19 DIAGNOSIS — G8929 Other chronic pain: Secondary | ICD-10-CM | POA: Diagnosis not present

## 2018-11-19 DIAGNOSIS — M545 Low back pain, unspecified: Secondary | ICD-10-CM

## 2018-11-19 DIAGNOSIS — E119 Type 2 diabetes mellitus without complications: Secondary | ICD-10-CM

## 2018-11-19 DIAGNOSIS — I10 Essential (primary) hypertension: Secondary | ICD-10-CM

## 2018-11-19 LAB — CBC WITH DIFFERENTIAL/PLATELET
Basophils Absolute: 0 10*3/uL (ref 0.0–0.1)
Basophils Relative: 0.7 % (ref 0.0–3.0)
Eosinophils Absolute: 0.1 10*3/uL (ref 0.0–0.7)
Eosinophils Relative: 1.8 % (ref 0.0–5.0)
HCT: 36 % (ref 36.0–46.0)
Hemoglobin: 12 g/dL (ref 12.0–15.0)
Lymphocytes Relative: 37.8 % (ref 12.0–46.0)
Lymphs Abs: 2.1 10*3/uL (ref 0.7–4.0)
MCHC: 33.4 g/dL (ref 30.0–36.0)
MCV: 83.9 fl (ref 78.0–100.0)
Monocytes Absolute: 0.3 10*3/uL (ref 0.1–1.0)
Monocytes Relative: 5.4 % (ref 3.0–12.0)
Neutro Abs: 3 10*3/uL (ref 1.4–7.7)
Neutrophils Relative %: 54.3 % (ref 43.0–77.0)
Platelets: 286 10*3/uL (ref 150.0–400.0)
RBC: 4.29 Mil/uL (ref 3.87–5.11)
RDW: 14.7 % (ref 11.5–15.5)
WBC: 5.6 10*3/uL (ref 4.0–10.5)

## 2018-11-19 LAB — LIPID PANEL
Cholesterol: 93 mg/dL (ref 0–200)
HDL: 50.6 mg/dL (ref >39.00)
LDL Cholesterol: 28 mg/dL (ref 0–99)
NonHDL: 42.51
Total CHOL/HDL Ratio: 2
Triglycerides: 71 mg/dL (ref 0.0–149.0)
VLDL: 14.2 mg/dL (ref 0.0–40.0)

## 2018-11-19 LAB — COMPREHENSIVE METABOLIC PANEL
ALT: 6 U/L (ref 0–35)
AST: 9 U/L (ref 0–37)
Albumin: 3.9 g/dL (ref 3.5–5.2)
Alkaline Phosphatase: 74 U/L (ref 39–117)
BUN: 10 mg/dL (ref 6–23)
CO2: 27 mEq/L (ref 19–32)
Calcium: 8.8 mg/dL (ref 8.4–10.5)
Chloride: 103 mEq/L (ref 96–112)
Creatinine, Ser: 0.86 mg/dL (ref 0.40–1.20)
GFR: 81.99 mL/min (ref >60.00)
Glucose, Bld: 209 mg/dL — ABNORMAL HIGH (ref 70–99)
Potassium: 4 mEq/L (ref 3.5–5.1)
Sodium: 138 mEq/L (ref 135–145)
Total Bilirubin: 0.3 mg/dL (ref 0.2–1.2)
Total Protein: 7.1 g/dL (ref 6.0–8.3)

## 2018-11-19 LAB — HEMOGLOBIN A1C: Hgb A1c MFr Bld: 8.7 % — ABNORMAL HIGH (ref 4.6–6.5)

## 2018-11-19 MED ORDER — NAPROXEN 375 MG PO TABS: 375.0000 mg | ORAL_TABLET | Freq: Two times a day (BID) | ORAL | 0 refills | Status: DC

## 2018-11-19 MED ORDER — EMPAGLIFLOZIN 10 MG PO TABS: 10.0000 mg | ORAL_TABLET | Freq: Every day | ORAL | 1 refills | Status: DC

## 2018-11-19 MED ORDER — METHOCARBAMOL 500 MG PO TABS: 500.0000 mg | ORAL_TABLET | Freq: Three times a day (TID) | ORAL | 0 refills | Status: DC | PRN

## 2018-11-19 NOTE — Progress Notes (Signed)
Stephanie Joseph is a 58 y.o. female with the following history as recorded in EpicCare:  Patient Active Problem List   Diagnosis Date Noted  . Chronic midline low back pain 07/12/2018  . Acute pharyngitis 09/16/2017  . Abnormal TSH 09/16/2017  . Glaucoma 05/21/2017  . Tooth pain 04/28/2017  . Encounter for general adult medical examination with abnormal findings 09/19/2016  . Class 1 obesity due to excess calories with serious comorbidity and body mass index (BMI) of 33.0 to 33.9 in adult 09/19/2016  . Vitamin D deficiency 09/19/2016  . Urinary frequency 02/14/2016  . Back pain 10/18/2015  . Type 2 diabetes mellitus ([place]) 07/10/2015  . Hormone replacement therapy (postmenopausal) 10/04/2014  . Mitral regurgitation 11/05/2012  . Aortic valve disease 10/21/2011  . Menorrhagia, premenopausal 09/01/2011  . Depression 03/04/2010  . GOUT 12/16/2006  . ANEMIA, IRON DEFICIENCY NOS 12/16/2006  . TOBACCO ABUSE 12/16/2006  . Essential hypertension 12/16/2006    Current Outpatient Medications  Medication Sig Dispense Refill  . amLODipine (NORVASC) 10 MG tablet Take 1 tablet (10 mg total) by mouth daily. Must follow-up w/new provider for future refills 90 tablet 0  . atorvastatin (LIPITOR) 10 MG tablet Take 1 tablet (10 mg total) by mouth daily. Must follow-up w/new provider for future refills 90 tablet 0  . brimonidine-timolol (COMBIGAN) 0.2-0.5 % ophthalmic solution Place 1 drop into both eyes every 12 (twelve) hours.    . diclofenac sodium (VOLTAREN) 1 % GEL Apply 2 g topically 4 (four) times daily. 100 g 3  . estradiol (ESTRACE) 1 MG tablet Take 1 tablet (1 mg total) by mouth daily. 90 tablet 3  . glucose blood (ACCU-CHEK AVIVA) test strip Use to check blood sugars 1-3 times daily. Dx Code: E11.9 300 each 1  . hydrOXYzine (ATARAX/VISTARIL) 50 MG tablet Take 50 mg by mouth 3 (three) times daily as needed for itching.    . Lancets (ACCU-CHEK SOFT TOUCH) lancets Use as instructed 100 each 12   . losartan-hydrochlorothiazide (HYZAAR) 100-12.5 MG tablet Take 1 tablet by mouth daily. Must follow-up w/new provider for future refills 90 tablet 0  . medroxyPROGESTERone (PROVERA) 5 MG tablet Take 1 tablet (5 mg total) by mouth daily. 90 tablet 3  . metFORMIN (GLUCOPHAGE XR) 500 MG 24 hr tablet Take 2 tablets (1,000 mg total) by mouth 2 (two) times daily. 180 tablet 2  . methocarbamol (ROBAXIN) 500 MG tablet Take 1 tablet (500 mg total) by mouth every 8 (eight) hours as needed for muscle spasms. 90 tablet 0  . Misc. Devices (CANE) MISC Use as needed for left knee pain. 1 each 0  . naproxen (NAPROSYN) 375 MG tablet Take 1 tablet (375 mg total) by mouth 2 (two) times daily. 60 tablet 0  . QUEtiapine (SEROQUEL) 50 MG tablet Take 50 mg by mouth at bedtime.    . traMADol (ULTRAM) 50 MG tablet Take 1 tablet (50 mg total) by mouth every 6 (six) hours as needed. 30 tablet 0  . venlafaxine (EFFEXOR) 75 MG tablet Take 75 mg by mouth 3 (three) times daily with meals.     No current facility-administered medications for this visit.     Allergies: Patient has no known allergies.  Past Medical History:  Diagnosis Date  . Anemia, iron deficiency   . Arthritis   . Depression   . Diabetes mellitus without complication ([place])   . Dysmenorrhea   . Gout   . Heart valve problem   . Hypertension   . Menorrhagia   .  Mitral regurgitation   . Pneumonia     Past Surgical History:  Procedure Laterality Date  . KNEE ARTHROSCOPY     Left    Family History  Problem Relation Age of Onset  . Hypertension Mother   . Stroke Paternal Grandmother   . Heart disease Sister   . Heart disease Brother     Social History   Tobacco Use  . Smoking status: Current Every Day Smoker    Packs/day: 0.50    Years: 7.00    Pack years: 3.50    Types: Cigarettes  . Smokeless tobacco: Never Used  Substance Use Topics  . Alcohol use: No    Subjective:  Presents for diabetes follow-up; admits not taking her  Metformin regularly in the past 4-6 weeks; has been 2 Metformin in the am and 2 in the pm; checks her blood sugars- not fasting 160-165; denies any concerns for low blood sugars; Denies any chest pain, shortness of breath, blurred vision or headache. Is taking her blood pressure medication regularly; does not need refills today; Asks for refills on Tramadol, Robaxin and Naproxen for her chronic back issues; in reviewing notes from her previous provider, was told to go back to her back specialist; patient notes she does not want to consider any type of injection/ cannot afford PT.    Objective:  Vitals:   11/19/18 1035  BP: 114/76  Pulse: 83  Temp: 98.1 F (36.7 C)  TempSrc: Oral  SpO2: 99%  Weight: 223 lb 1.9 oz (101.2 kg)  Height: _0  (1.702 m)    General: Well developed, well nourished, in no acute distress  Skin : Warm and dry.  Head: Normocephalic and atraumatic  Lungs: Respirations unlabored; clear to auscultation bilaterally without wheeze, rales, rhonchi  CVS exam: normal rate and regular rhythm.  Neurologic: Alert and oriented; speech intact; face symmetrical; moves all extremities well; CNII-XII intact without focal deficit    Assessment:  1. Type 2 diabetes mellitus without complication, without long-term current use of insulin ([place])   2. Essential hypertension   3. Chronic midline low back pain     Plan:  1. ? Control; ? If patient is actually taking medication as prescribed; last Hgba1c at 7.9; if another agent needs to be added, she only wants oral medication; follow-up to be determined. 2. Stable; continue same medications; 3. Patient is given refills on Robaxin and Naproxen; she is encouraged to see her back specialist as has been recommended in the past; at this time, will not be refilling Tramadol for her.   No follow-ups on file.  Orders Placed This Encounter  Procedures  . Tdap vaccine greater than or equal to 7yo IM  . CBC w/Diff    Standing Status:    Future    Number of Occurrences:   1    Standing Expiration Date:   11/19/2019  . Comp Met (CMET)    Standing Status:   Future    Number of Occurrences:   1    Standing Expiration Date:   11/19/2019  . Lipid panel    Standing Status:   Future    Number of Occurrences:   1    Standing Expiration Date:   11/19/2019  . HgB A1c    Standing Status:   Future    Number of Occurrences:   1    Standing Expiration Date:   11/19/2019    Requested Prescriptions   Signed Prescriptions Disp Refills  . methocarbamol (ROBAXIN)  500 MG tablet 90 tablet 0    Sig: Take 1 tablet (500 mg total) by mouth every 8 (eight) hours as needed for muscle spasms.  . naproxen (NAPROSYN) 375 MG tablet 60 tablet 0    Sig: Take 1 tablet (375 mg total) by mouth 2 (two) times daily.

## 2018-11-21 ENCOUNTER — Encounter (HOSPITAL_COMMUNITY): Payer: Self-pay

## 2018-11-21 ENCOUNTER — Other Ambulatory Visit: Payer: Self-pay

## 2018-11-21 ENCOUNTER — Ambulatory Visit (HOSPITAL_COMMUNITY)
Admission: EM | Admit: 2018-11-21 | Discharge: 2018-11-21 | Disposition: A | Discharge status: Home / Self Care | Attending: Urgent Care | Admitting: Urgent Care

## 2018-11-21 DIAGNOSIS — I10 Essential (primary) hypertension: Secondary | ICD-10-CM

## 2018-11-21 DIAGNOSIS — L299 Pruritus, unspecified: Secondary | ICD-10-CM

## 2018-11-21 DIAGNOSIS — L249 Irritant contact dermatitis, unspecified cause: Secondary | ICD-10-CM | POA: Diagnosis not present

## 2018-11-21 DIAGNOSIS — M79622 Pain in left upper arm: Secondary | ICD-10-CM

## 2018-11-21 DIAGNOSIS — E1165 Type 2 diabetes mellitus with hyperglycemia: Secondary | ICD-10-CM

## 2018-11-21 MED ORDER — BETAMETHASONE DIPROPIONATE 0.05 % EX OINT: TOPICAL_OINTMENT | Freq: Two times a day (BID) | CUTANEOUS | 0 refills | Status: DC

## 2018-11-21 MED ORDER — HYDROXYZINE HCL 25 MG PO TABS: 25.0000 mg | ORAL_TABLET | Freq: Three times a day (TID) | ORAL | 0 refills | Status: DC | PRN

## 2018-11-21 NOTE — ED Triage Notes (Signed)
Pt states she has some pain in her left arm and it itches. Pt states she got a D tap on Friday in her right deltoid. Saturday she got woke up with the pain and itching.

## 2018-11-21 NOTE — ED Provider Notes (Addendum)
MRN: [id number] DOB: 1960-12-29  Subjective:   Kathrynn Humblengela Meloni is a 58 y.o. female with past medical history of hypertension, type 2 diabetes, menopause on HRT presenting for 1 day history of acute onset itching of her left upper arm.  She is also had mild to moderate pain.  Reports that her arm feels hot to the touch.  She also has what looks like a bite to the patient on her inner left bicep.  She admits that she spent a lot of times outdoors yesterday.  Cannot recall any particular inciting factor.  Denies fever, chest pain, confusion, shortness of breath, heart racing, belly pain, nausea, vomiting, diaphoresis, neck or jaw pain.  She tried naproxen last night for pain and itching with mild relief.  Of note patient did get a Tdap last week of her right arm.  She denies facial swelling, tongue swelling, rash over the injection site. Patient is an active smoker.  No current facility-administered medications for this encounter.   Current Outpatient Medications:  .  amLODipine (NORVASC) 10 MG tablet, Take 1 tablet (10 mg total) by mouth daily. Must follow-up w/new provider for future refills, Disp: 90 tablet, Rfl: 0 .  atorvastatin (LIPITOR) 10 MG tablet, Take 1 tablet (10 mg total) by mouth daily. Must follow-up w/new provider for future refills, Disp: 90 tablet, Rfl: 0 .  brimonidine-timolol (COMBIGAN) 0.2-0.5 % ophthalmic solution, Place 1 drop into both eyes every 12 (twelve) hours., Disp: , Rfl:  .  diclofenac sodium (VOLTAREN) 1 % GEL, Apply 2 g topically 4 (four) times daily., Disp: 100 g, Rfl: 3 .  empagliflozin (JARDIANCE) 10 MG TABS tablet, Take 10 mg by mouth daily., Disp: 90 tablet, Rfl: 1 .  estradiol (ESTRACE) 1 MG tablet, Take 1 tablet (1 mg total) by mouth daily., Disp: 90 tablet, Rfl: 3 .  glucose blood (ACCU-CHEK AVIVA) test strip, Use to check blood sugars 1-3 times daily. Dx Code: E11.9, Disp: 300 each, Rfl: 1 .  hydrOXYzine (ATARAX/VISTARIL) 50 MG tablet, Take 50 mg by mouth 3  (three) times daily as needed for itching., Disp: , Rfl:  .  Lancets (ACCU-CHEK SOFT TOUCH) lancets, Use as instructed, Disp: 100 each, Rfl: 12 .  losartan-hydrochlorothiazide (HYZAAR) 100-12.5 MG tablet, Take 1 tablet by mouth daily. Must follow-up w/new provider for future refills, Disp: 90 tablet, Rfl: 0 .  medroxyPROGESTERone (PROVERA) 5 MG tablet, Take 1 tablet (5 mg total) by mouth daily., Disp: 90 tablet, Rfl: 3 .  metFORMIN (GLUCOPHAGE XR) 500 MG 24 hr tablet, Take 2 tablets (1,000 mg total) by mouth 2 (two) times daily., Disp: 180 tablet, Rfl: 2 .  methocarbamol (ROBAXIN) 500 MG tablet, Take 1 tablet (500 mg total) by mouth every 8 (eight) hours as needed for muscle spasms., Disp: 90 tablet, Rfl: 0 .  Misc. Devices (CANE) MISC, Use as needed for left knee pain., Disp: 1 each, Rfl: 0 .  naproxen (NAPROSYN) 375 MG tablet, Take 1 tablet (375 mg total) by mouth 2 (two) times daily., Disp: 60 tablet, Rfl: 0 .  QUEtiapine (SEROQUEL) 50 MG tablet, Take 50 mg by mouth at bedtime., Disp: , Rfl:  .  traMADol (ULTRAM) 50 MG tablet, Take 1 tablet (50 mg total) by mouth every 6 (six) hours as needed., Disp: 30 tablet, Rfl: 0 .  venlafaxine (EFFEXOR) 75 MG tablet, Take 75 mg by mouth 3 (three) times daily with meals., Disp: , Rfl:    No Known Allergies  Past Medical History:  Diagnosis Date  .  Anemia, iron deficiency   . Arthritis   . Depression   . Diabetes mellitus without complication (HCC)   . Dysmenorrhea   . Gout   . Heart valve problem   . Hypertension   . Menorrhagia   . Mitral regurgitation   . Pneumonia      Past Surgical History:  Procedure Laterality Date  . KNEE ARTHROSCOPY     Left    ROS  Objective:   Vitals: BP 133/77 (BP Location: Right Arm)   Pulse 100   Temp 98.2 F (36.8 C) (Oral)   Resp 16   Wt 221 lb (100.2 kg)   LMP 02/13/2014 (Within Days)   SpO2 100%   BMI 34.61 kg/m   Physical Exam Constitutional:      General: She is not in acute distress.     Appearance: Normal appearance. She is well-developed. She is not ill-appearing, toxic-appearing or diaphoretic.  HENT:     Head: Normocephalic and atraumatic.     Nose: Nose normal.     Mouth/Throat:     Mouth: Mucous membranes are moist.     Pharynx: Oropharynx is clear.  Eyes:     General: No scleral icterus.    Extraocular Movements: Extraocular movements intact.     Pupils: Pupils are equal, round, and reactive to light.  Cardiovascular:     Rate and Rhythm: Normal rate and regular rhythm.     Pulses: Normal pulses.     Heart sounds: Normal heart sounds. No murmur. No friction rub. No gallop.   Pulmonary:     Effort: Pulmonary effort is normal. No respiratory distress.     Breath sounds: Normal breath sounds. No stridor. No wheezing, rhonchi or rales.  Skin:    General: Skin is warm and dry.     Findings: Rash present.       Neurological:     General: No focal deficit present.     Mental Status: She is alert and oriented to person, place, and time.  Psychiatric:        Mood and Affect: Mood normal.        Behavior: Behavior normal.        Thought Content: Thought content normal.     Assessment and Plan :   Irritant contact dermatitis, unspecified trigger  Itching  Left upper arm pain  We will manage as a contact dermatitis with betamethasone ointment and Vistaril for itching.  I do not suspect despite her risk factors given how focal her symptoms are and lack of any other ACS symptoms.  Counseled patient on potential for adverse effects with medications prescribed today, patient verbalized understanding. ER and return-to-clinic precautions discussed, patient verbalized understanding.    Wallis Bamberg, PA-C 11/21/18 1135

## 2019-01-13 ENCOUNTER — Telehealth: Payer: Self-pay | Admitting: *Deleted

## 2019-01-13 MED ORDER — METFORMIN HCL 500 MG PO TABS: 1000.0000 mg | ORAL_TABLET | Freq: Two times a day (BID) | ORAL | 1 refills | Status: DC

## 2019-01-13 NOTE — Telephone Encounter (Signed)
Received fax stating Metformin ER is recalled. Need new rx. Please advise.

## 2019-01-13 NOTE — Telephone Encounter (Signed)
Will change the formulation to Metformin IR; she still needs to take 2 in the am and 2 in the pm; she needs a diabetes follow-up in mid- August please.

## 2019-01-14 NOTE — Telephone Encounter (Signed)
Pt informed of response and stated understanding. She has been scheduled for follow up in [facility] August.

## 2019-01-24 ENCOUNTER — Ambulatory Visit: Admitting: Family

## 2019-01-24 ENCOUNTER — Telehealth: Payer: Self-pay

## 2019-01-24 DIAGNOSIS — L918 Other hypertrophic disorders of the skin: Secondary | ICD-10-CM

## 2019-01-24 MED ORDER — FLUCONAZOLE 150 MG PO TABS: 150.0000 mg | ORAL_TABLET | Freq: Once | ORAL | 0 refills | Status: AC

## 2019-01-24 NOTE — Telephone Encounter (Signed)
I will call in [place] for her; She needs to schedule a diabetes follow-up in 1 month please.  I will do referral to dermatology as well.

## 2019-01-24 NOTE — Telephone Encounter (Signed)
Spoke with patient and info given 

## 2019-02-16 ENCOUNTER — Other Ambulatory Visit (INDEPENDENT_AMBULATORY_CARE_PROVIDER_SITE_OTHER)

## 2019-02-16 ENCOUNTER — Other Ambulatory Visit: Payer: Self-pay

## 2019-02-16 ENCOUNTER — Encounter: Payer: Self-pay | Admitting: Family

## 2019-02-16 ENCOUNTER — Other Ambulatory Visit: Payer: Self-pay | Admitting: Family

## 2019-02-16 ENCOUNTER — Ambulatory Visit (INDEPENDENT_AMBULATORY_CARE_PROVIDER_SITE_OTHER): Admitting: Family

## 2019-02-16 VITALS — BP 116/80 | HR 71 | Temp 98.0°F | Ht 67.0 in | Wt 215.1 lb

## 2019-02-16 DIAGNOSIS — E785 Hyperlipidemia, unspecified: Secondary | ICD-10-CM | POA: Diagnosis not present

## 2019-02-16 DIAGNOSIS — E119 Type 2 diabetes mellitus without complications: Secondary | ICD-10-CM

## 2019-02-16 DIAGNOSIS — K047 Periapical abscess without sinus: Secondary | ICD-10-CM

## 2019-02-16 DIAGNOSIS — I10 Essential (primary) hypertension: Secondary | ICD-10-CM

## 2019-02-16 LAB — COMPREHENSIVE METABOLIC PANEL
ALT: 7 U/L (ref 0–35)
AST: 9 U/L (ref 0–37)
Albumin: 4.1 g/dL (ref 3.5–5.2)
Alkaline Phosphatase: 68 U/L (ref 39–117)
BUN: 13 mg/dL (ref 6–23)
CO2: 27 mEq/L (ref 19–32)
Calcium: 9.2 mg/dL (ref 8.4–10.5)
Chloride: 104 mEq/L (ref 96–112)
Creatinine, Ser: 0.89 mg/dL (ref 0.40–1.20)
GFR: 78.74 mL/min (ref >60.00)
Glucose, Bld: 133 mg/dL — ABNORMAL HIGH (ref 70–99)
Potassium: 4 mEq/L (ref 3.5–5.1)
Sodium: 139 mEq/L (ref 135–145)
Total Bilirubin: 0.4 mg/dL (ref 0.2–1.2)
Total Protein: 7 g/dL (ref 6.0–8.3)

## 2019-02-16 LAB — CBC WITH DIFFERENTIAL/PLATELET
Basophils Absolute: 0 10*3/uL (ref 0.0–0.1)
Basophils Relative: 0.7 % (ref 0.0–3.0)
Eosinophils Absolute: 0.1 10*3/uL (ref 0.0–0.7)
Eosinophils Relative: 2.2 % (ref 0.0–5.0)
HCT: 38.2 % (ref 36.0–46.0)
Hemoglobin: 12.5 g/dL (ref 12.0–15.0)
Lymphocytes Relative: 35.7 % (ref 12.0–46.0)
Lymphs Abs: 2.3 10*3/uL (ref 0.7–4.0)
MCHC: 32.6 g/dL (ref 30.0–36.0)
MCV: 84.1 fl (ref 78.0–100.0)
Monocytes Absolute: 0.4 10*3/uL (ref 0.1–1.0)
Monocytes Relative: 6.1 % (ref 3.0–12.0)
Neutro Abs: 3.5 10*3/uL (ref 1.4–7.7)
Neutrophils Relative %: 55.3 % (ref 43.0–77.0)
Platelets: 282 10*3/uL (ref 150.0–400.0)
RBC: 4.54 Mil/uL (ref 3.87–5.11)
RDW: 14.7 % (ref 11.5–15.5)
WBC: 6.4 10*3/uL (ref 4.0–10.5)

## 2019-02-16 LAB — HEMOGLOBIN A1C: Hgb A1c MFr Bld: 7.7 % — ABNORMAL HIGH (ref 4.6–6.5)

## 2019-02-16 MED ORDER — LOSARTAN POTASSIUM-HCTZ 100-12.5 MG PO TABS: 1.0000 | ORAL_TABLET | Freq: Every day | ORAL | 3 refills | Status: DC

## 2019-02-16 MED ORDER — JARDIANCE 10 MG PO TABS: 10.0000 mg | ORAL_TABLET | Freq: Every day | ORAL | 1 refills | Status: DC

## 2019-02-16 MED ORDER — AMLODIPINE BESYLATE 10 MG PO TABS: 10.0000 mg | ORAL_TABLET | Freq: Every day | ORAL | 3 refills | Status: DC

## 2019-02-16 MED ORDER — ATORVASTATIN CALCIUM 10 MG PO TABS: 10.0000 mg | ORAL_TABLET | Freq: Every day | ORAL | 3 refills | Status: DC

## 2019-02-16 MED ORDER — AMOXICILLIN 875 MG PO TABS: 875.0000 mg | ORAL_TABLET | Freq: Two times a day (BID) | ORAL | 0 refills | Status: DC

## 2019-02-16 NOTE — Progress Notes (Signed)
Stephanie Joseph is a 58 y.o. female with the following history as recorded in EpicCare:  Patient Active Problem List   Diagnosis Date Noted  . Chronic midline low back pain 07/12/2018  . Acute pharyngitis 09/16/2017  . Abnormal TSH 09/16/2017  . Glaucoma 05/21/2017  . Tooth pain 04/28/2017  . Encounter for general adult medical examination with abnormal findings 09/19/2016  . Class 1 obesity due to excess calories with serious comorbidity and body mass index (BMI) of 33.0 to 33.9 in adult 09/19/2016  . Vitamin D deficiency 09/19/2016  . Urinary frequency 02/14/2016  . Back pain 10/18/2015  . Type 2 diabetes mellitus ([place]) 07/10/2015  . Hormone replacement therapy (postmenopausal) 10/04/2014  . Mitral regurgitation 11/05/2012  . Aortic valve disease 10/21/2011  . Menorrhagia, premenopausal 09/01/2011  . Depression 03/04/2010  . GOUT 12/16/2006  . ANEMIA, IRON DEFICIENCY NOS 12/16/2006  . TOBACCO ABUSE 12/16/2006  . Essential hypertension 12/16/2006    Current Outpatient Medications  Medication Sig Dispense Refill  . amLODipine (NORVASC) 10 MG tablet Take 1 tablet (10 mg total) by mouth daily. Must follow-up w/new provider for future refills 90 tablet 0  . atorvastatin (LIPITOR) 10 MG tablet Take 1 tablet (10 mg total) by mouth daily. Must follow-up w/new provider for future refills 90 tablet 0  . betamethasone dipropionate (DIPROLENE) 0.05 % ointment Apply topically 2 (two) times daily. 30 g 0  . brimonidine-timolol (COMBIGAN) 0.2-0.5 % ophthalmic solution Place 1 drop into both eyes every 12 (twelve) hours.    . diclofenac sodium (VOLTAREN) 1 % GEL Apply 2 g topically 4 (four) times daily. 100 g 3  . empagliflozin (JARDIANCE) 10 MG TABS tablet Take 10 mg by mouth daily. 90 tablet 1  . estradiol (ESTRACE) 1 MG tablet Take 1 tablet (1 mg total) by mouth daily. 90 tablet 3  . glucose blood (ACCU-CHEK AVIVA) test strip Use to check blood sugars 1-3 times daily. Dx Code: E11.9 300 each  1  . hydrOXYzine (ATARAX/VISTARIL) 25 MG tablet Take 1 tablet (25 mg total) by mouth every 8 (eight) hours as needed for itching. 30 tablet 0  . Lancets (ACCU-CHEK SOFT TOUCH) lancets Use as instructed 100 each 12  . losartan-hydrochlorothiazide (HYZAAR) 100-12.5 MG tablet Take 1 tablet by mouth daily. Must follow-up w/new provider for future refills 90 tablet 0  . medroxyPROGESTERone (PROVERA) 5 MG tablet Take 1 tablet (5 mg total) by mouth daily. 90 tablet 3  . metFORMIN (GLUCOPHAGE) 500 MG tablet Take 2 tablets (1,000 mg total) by mouth 2 (two) times daily with a meal. 120 tablet 1  . methocarbamol (ROBAXIN) 500 MG tablet Take 1 tablet (500 mg total) by mouth every 8 (eight) hours as needed for muscle spasms. 90 tablet 0  . Misc. Devices (CANE) MISC Use as needed for left knee pain. 1 each 0  . naproxen (NAPROSYN) 375 MG tablet Take 1 tablet (375 mg total) by mouth 2 (two) times daily. 60 tablet 0  . QUEtiapine (SEROQUEL) 50 MG tablet Take 50 mg by mouth at bedtime.    . traMADol (ULTRAM) 50 MG tablet Take 1 tablet (50 mg total) by mouth every 6 (six) hours as needed. 30 tablet 0  . venlafaxine (EFFEXOR) 75 MG tablet Take 75 mg by mouth 3 (three) times daily with meals.    Marland Kitchen amoxicillin (AMOXIL) 875 MG tablet Take 1 tablet (875 mg total) by mouth 2 (two) times daily. 20 tablet 0   No current facility-administered medications for this visit.  Allergies: Patient has no known allergies.  Past Medical History:  Diagnosis Date  . Anemia, iron deficiency   . Arthritis   . Depression   . Diabetes mellitus without complication ([place])   . Dysmenorrhea   . Gout   . Heart valve problem   . Hypertension   . Menorrhagia   . Mitral regurgitation   . Pneumonia     Past Surgical History:  Procedure Laterality Date  . KNEE ARTHROSCOPY     Left    Family History  Problem Relation Age of Onset  . Hypertension Mother   . Stroke Paternal Grandmother   . Heart disease Sister   . Heart disease  Brother     Social History   Tobacco Use  . Smoking status: Current Every Day Smoker    Packs/day: 0.50    Years: 7.00    Pack years: 3.50    Types: Cigarettes  . Smokeless tobacco: Never Used  Substance Use Topics  . Alcohol use: No    Subjective:   3 month follow-up on Type 2 Diabetes- at last OV in May, hgba1c showed worsening control at 8.7; currently taking Metformin 500 mg bid and Jardiance 10 mg daily; notes that she could not tolerate the Metformin at 1000 mg bid. Also requesting refills on her blood pressure and cholesterol medications; Denies any chest pain, shortness of breath, blurred vision or headache + smoker- not interested in quitting;  Does see GYN regularly- Dr. Roselie Awkward; Needs to see dentist to have her teeth pulled- cannot afford at this time; requesting short-term refill on antibiotic;     Objective:  Vitals:   02/16/19 0940  BP: 116/80  Pulse: 71  Temp: 98 F (36.7 C)  TempSrc: Oral  SpO2: 99%  Weight: 215 lb 1.3 oz (97.6 kg)  Height: '5\' 7"'  (1.702 m)    General: Well developed, well nourished, in no acute distress  Skin : Warm and dry.  Head: Normocephalic and atraumatic  Eyes: Sclera and conjunctiva clear; pupils round and reactive to light; extraocular movements intact  Ears: External normal; canals clear; tympanic membranes normal  Oropharynx: Pink, supple. No suspicious lesions  Neck: Supple without thyromegaly, adenopathy  Lungs: Respirations unlabored; clear to auscultation bilaterally without wheeze, rales, rhonchi  CVS exam: normal rate and regular rhythm.  Neurologic: Alert and oriented; speech intact; face symmetrical; moves all extremities well; CNII-XII intact without focal deficit   Assessment:  1. Type 2 diabetes mellitus without complication, without long-term current use of insulin ([place])   2. Essential hypertension   3. Hyperlipidemia, unspecified hyperlipidemia type   4. Dental infection     Plan:  1. Update labs today;  currently taking Metformin 500 mg bid and Jardiance 10 mg; will adjust according; needs to quit smoking- patient defers. 2. Stable- continue same medications. 3. Stable; continue same medication; 4. Rx for Amoxicillin 875 mg bid x 10 days; needs to see dentist- [facility];   No follow-ups on file.  Orders Placed This Encounter  Procedures  . CBC w/Diff    Standing Status:   Future    Standing Expiration Date:   02/16/2020  . Comp Met (CMET)    Standing Status:   Future    Standing Expiration Date:   02/16/2020  . HgB A1c    Standing Status:   Future    Standing Expiration Date:   02/16/2020    Requested Prescriptions   Signed Prescriptions Disp Refills  . amoxicillin (AMOXIL) 875 MG  tablet 20 tablet 0    Sig: Take 1 tablet (875 mg total) by mouth 2 (two) times daily.

## 2019-03-24 ENCOUNTER — Other Ambulatory Visit: Payer: Self-pay | Admitting: Family

## 2019-03-24 DIAGNOSIS — M545 Low back pain, unspecified: Secondary | ICD-10-CM

## 2019-03-24 DIAGNOSIS — G8929 Other chronic pain: Secondary | ICD-10-CM

## 2019-03-25 MED ORDER — NAPROXEN 375 MG PO TABS: 375.0000 mg | ORAL_TABLET | Freq: Two times a day (BID) | ORAL | 0 refills | Status: DC

## 2019-03-25 MED ORDER — METHOCARBAMOL 500 MG PO TABS: 500.0000 mg | ORAL_TABLET | Freq: Three times a day (TID) | ORAL | 0 refills | Status: DC | PRN

## 2019-03-31 ENCOUNTER — Other Ambulatory Visit: Payer: Self-pay | Admitting: Family

## 2019-03-31 DIAGNOSIS — Z1231 Encounter for screening mammogram for malignant neoplasm of breast: Secondary | ICD-10-CM

## 2019-04-05 ENCOUNTER — Other Ambulatory Visit: Payer: Self-pay

## 2019-04-05 ENCOUNTER — Other Ambulatory Visit (INDEPENDENT_AMBULATORY_CARE_PROVIDER_SITE_OTHER)

## 2019-04-05 ENCOUNTER — Ambulatory Visit (INDEPENDENT_AMBULATORY_CARE_PROVIDER_SITE_OTHER): Admitting: Family

## 2019-04-05 ENCOUNTER — Encounter: Payer: Self-pay | Admitting: Family

## 2019-04-05 VITALS — BP 114/76 | HR 73 | Temp 98.3°F | Ht 67.0 in | Wt 216.8 lb

## 2019-04-05 DIAGNOSIS — E119 Type 2 diabetes mellitus without complications: Secondary | ICD-10-CM

## 2019-04-05 DIAGNOSIS — B373 Candidiasis of vulva and vagina: Secondary | ICD-10-CM

## 2019-04-05 DIAGNOSIS — B3731 Acute candidiasis of vulva and vagina: Secondary | ICD-10-CM

## 2019-04-05 LAB — CBC WITH DIFFERENTIAL/PLATELET
Basophils Absolute: 0.1 10*3/uL (ref 0.0–0.1)
Basophils Relative: 1 % (ref 0.0–3.0)
Eosinophils Absolute: 0.2 10*3/uL (ref 0.0–0.7)
Eosinophils Relative: 3.4 % (ref 0.0–5.0)
HCT: 38 % (ref 36.0–46.0)
Hemoglobin: 12.3 g/dL (ref 12.0–15.0)
Lymphocytes Relative: 38.3 % (ref 12.0–46.0)
Lymphs Abs: 2.3 10*3/uL (ref 0.7–4.0)
MCHC: 32.3 g/dL (ref 30.0–36.0)
MCV: 84 fl (ref 78.0–100.0)
Monocytes Absolute: 0.4 10*3/uL (ref 0.1–1.0)
Monocytes Relative: 6.3 % (ref 3.0–12.0)
Neutro Abs: 3.1 10*3/uL (ref 1.4–7.7)
Neutrophils Relative %: 51 % (ref 43.0–77.0)
Platelets: 288 10*3/uL (ref 150.0–400.0)
RBC: 4.52 Mil/uL (ref 3.87–5.11)
RDW: 15.3 % (ref 11.5–15.5)
WBC: 6 10*3/uL (ref 4.0–10.5)

## 2019-04-05 LAB — COMPREHENSIVE METABOLIC PANEL
ALT: 6 U/L (ref 0–35)
AST: 8 U/L (ref 0–37)
Albumin: 3.9 g/dL (ref 3.5–5.2)
Alkaline Phosphatase: 74 U/L (ref 39–117)
BUN: 14 mg/dL (ref 6–23)
CO2: 26 mEq/L (ref 19–32)
Calcium: 8.9 mg/dL (ref 8.4–10.5)
Chloride: 102 mEq/L (ref 96–112)
Creatinine, Ser: 0.87 mg/dL (ref 0.40–1.20)
GFR: 80.8 mL/min (ref >60.00)
Glucose, Bld: 160 mg/dL — ABNORMAL HIGH (ref 70–99)
Potassium: 3.7 mEq/L (ref 3.5–5.1)
Sodium: 136 mEq/L (ref 135–145)
Total Bilirubin: 0.4 mg/dL (ref 0.2–1.2)
Total Protein: 7.1 g/dL (ref 6.0–8.3)

## 2019-04-05 LAB — HEMOGLOBIN A1C: Hgb A1c MFr Bld: 7.9 % — ABNORMAL HIGH (ref 4.6–6.5)

## 2019-04-05 MED ORDER — FLUCONAZOLE 100 MG PO TABS: 100.0000 mg | ORAL_TABLET | Freq: Every day | ORAL | 0 refills | Status: DC

## 2019-04-05 MED ORDER — NYSTATIN 100000 UNIT/GM EX CREA: 1.0000 "application " | TOPICAL_CREAM | Freq: Two times a day (BID) | CUTANEOUS | 0 refills | Status: DC

## 2019-04-05 NOTE — Progress Notes (Signed)
Stephanie Joseph is a 58 y.o. female with the following history as recorded in EpicCare:  Patient Active Problem List   Diagnosis Date Noted  . Chronic midline low back pain 07/12/2018  . Acute pharyngitis 09/16/2017  . Abnormal TSH 09/16/2017  . Glaucoma 05/21/2017  . Tooth pain 04/28/2017  . Encounter for general adult medical examination with abnormal findings 09/19/2016  . Class 1 obesity due to excess calories with serious comorbidity and body mass index (BMI) of 33.0 to 33.9 in adult 09/19/2016  . Vitamin D deficiency 09/19/2016  . Urinary frequency 02/14/2016  . Back pain 10/18/2015  . Type 2 diabetes mellitus with hyperglycemia, without long-term current use of insulin ([place]) 07/10/2015  . Hormone replacement therapy (postmenopausal) 10/04/2014  . Mitral regurgitation 11/05/2012  . Aortic valve disease 10/21/2011  . Menorrhagia, premenopausal 09/01/2011  . Depression 03/04/2010  . GOUT 12/16/2006  . ANEMIA, IRON DEFICIENCY NOS 12/16/2006  . TOBACCO ABUSE 12/16/2006  . Essential hypertension 12/16/2006    Current Outpatient Medications  Medication Sig Dispense Refill  . amLODipine (NORVASC) 10 MG tablet Take 1 tablet (10 mg total) by mouth daily. 90 tablet 3  . atorvastatin (LIPITOR) 10 MG tablet Take 1 tablet (10 mg total) by mouth daily. 90 tablet 3  . betamethasone dipropionate (DIPROLENE) 0.05 % ointment Apply topically 2 (two) times daily. 30 g 0  . brimonidine-timolol (COMBIGAN) 0.2-0.5 % ophthalmic solution Place 1 drop into both eyes every 12 (twelve) hours.    . diclofenac sodium (VOLTAREN) 1 % GEL Apply 2 g topically 4 (four) times daily. 100 g 3  . empagliflozin (JARDIANCE) 10 MG TABS tablet Take 10 mg by mouth daily. 90 tablet 1  . estradiol (ESTRACE) 1 MG tablet Take 1 tablet (1 mg total) by mouth daily. 90 tablet 3  . glucose blood (ACCU-CHEK AVIVA) test strip Use to check blood sugars 1-3 times daily. Dx Code: E11.9 300 each 1  . hydrOXYzine (ATARAX/VISTARIL)  25 MG tablet Take 1 tablet (25 mg total) by mouth every 8 (eight) hours as needed for itching. 30 tablet 0  . Lancets (ACCU-CHEK SOFT TOUCH) lancets Use as instructed 100 each 12  . losartan-hydrochlorothiazide (HYZAAR) 100-12.5 MG tablet Take 1 tablet by mouth daily. 90 tablet 3  . medroxyPROGESTERone (PROVERA) 5 MG tablet Take 1 tablet (5 mg total) by mouth daily. 90 tablet 3  . metFORMIN (GLUCOPHAGE) 500 MG tablet Take 2 tablets (1,000 mg total) by mouth 2 (two) times daily with a meal. 120 tablet 1  . methocarbamol (ROBAXIN) 500 MG tablet Take 1 tablet (500 mg total) by mouth every 8 (eight) hours as needed for muscle spasms. 90 tablet 0  . Misc. Devices (CANE) MISC Use as needed for left knee pain. 1 each 0  . naproxen (NAPROSYN) 375 MG tablet Take 1 tablet (375 mg total) by mouth 2 (two) times daily. 60 tablet 0  . QUEtiapine (SEROQUEL) 50 MG tablet Take 50 mg by mouth at bedtime.    . traMADol (ULTRAM) 50 MG tablet Take 1 tablet (50 mg total) by mouth every 6 (six) hours as needed. 30 tablet 0  . venlafaxine (EFFEXOR) 75 MG tablet Take 75 mg by mouth 3 (three) times daily with meals.    . fluconazole (DIFLUCAN) 100 MG tablet Take 1 tablet (100 mg total) by mouth daily. 7 tablet 0  . nystatin cream (MYCOSTATIN) Apply 1 application topically 2 (two) times daily. 30 g 0   No current facility-administered medications for this visit.  Allergies: Patient has no known allergies.  Past Medical History:  Diagnosis Date  . Anemia, iron deficiency   . Arthritis   . Depression   . Diabetes mellitus without complication ([place])   . Dysmenorrhea   . Gout   . Heart valve problem   . Hypertension   . Menorrhagia   . Mitral regurgitation   . Pneumonia     Past Surgical History:  Procedure Laterality Date  . KNEE ARTHROSCOPY     Left    Family History  Problem Relation Age of Onset  . Hypertension Mother   . Stroke Paternal Grandmother   . Heart disease Sister   . Heart disease Brother      Social History   Tobacco Use  . Smoking status: Current Every Day Smoker    Packs/day: 0.50    Years: 7.00    Pack years: 3.50    Types: Cigarettes  . Smokeless tobacco: Never Used  Substance Use Topics  . Alcohol use: No    Subjective:  Vaginal itching x 2 days; denies any new soaps, foods, detergents or medications. Localized in outer vaginal area; no prior history of HSV; no vaginal discharge or spotting; does take Jardiance daily for diabetes; feels that blood sugars have been doing very well;  Up to date on GYN care- sees Dr. Roselie Awkward regularly;  Does not want a flu shot today;      Objective:  Vitals:   04/05/19 0926  BP: 114/76  Pulse: 73  Temp: 98.3 F (36.8 C)  TempSrc: Oral  SpO2: 98%  Weight: 216 lb 12.8 oz (98.3 kg)  Height: '5\' 7"'  (1.702 m)    General: Well developed, well nourished, in no acute distress  Skin : Warm and dry.  Head: Normocephalic and atraumatic; Neurologic: Alert and oriented; speech intact; face symmetrical; moves all extremities well; CNII-XII intact without focal deficit  Pelvic exam- external erythema noted on outer labia;  Assessment:  1. Type 2 diabetes mellitus without complication, without long-term current use of insulin ([place])   2. Vaginal yeast infection     Plan:  ? If symptoms could be related to use of Jardiance- will plan to monitor before automatically changing treatment; Rx for Diflucan 100 mg qd x 7 days, Nystatin topical cream; follow-up to be determined; Check CBC, CMP, Hgba1c today as well; follow-up to be determined.   No follow-ups on file.  Orders Placed This Encounter  Procedures  . CBC w/Diff    Standing Status:   Future    Number of Occurrences:   1    Standing Expiration Date:   04/04/2020  . Comp Met (CMET)    Standing Status:   Future    Number of Occurrences:   1    Standing Expiration Date:   04/04/2020  . HgB A1c    Standing Status:   Future    Number of Occurrences:   1    Standing Expiration  Date:   04/04/2020    Requested Prescriptions   Signed Prescriptions Disp Refills  . fluconazole (DIFLUCAN) 100 MG tablet 7 tablet 0    Sig: Take 1 tablet (100 mg total) by mouth daily.  Marland Kitchen nystatin cream (MYCOSTATIN) 30 g 0    Sig: Apply 1 application topically 2 (two) times daily.

## 2019-04-27 ENCOUNTER — Encounter: Payer: Self-pay | Admitting: Obstetrics & Gynecology

## 2019-04-27 ENCOUNTER — Other Ambulatory Visit: Payer: Self-pay

## 2019-04-27 ENCOUNTER — Ambulatory Visit (INDEPENDENT_AMBULATORY_CARE_PROVIDER_SITE_OTHER): Admitting: Obstetrics & Gynecology

## 2019-04-27 VITALS — BP 132/85 | HR 89 | Ht 67.0 in | Wt 217.0 lb

## 2019-04-27 DIAGNOSIS — Z124 Encounter for screening for malignant neoplasm of cervix: Secondary | ICD-10-CM | POA: Diagnosis not present

## 2019-04-27 DIAGNOSIS — E6609 Other obesity due to excess calories: Secondary | ICD-10-CM

## 2019-04-27 DIAGNOSIS — Z01419 Encounter for gynecological examination (general) (routine) without abnormal findings: Secondary | ICD-10-CM

## 2019-04-27 DIAGNOSIS — Z1151 Encounter for screening for human papillomavirus (HPV): Secondary | ICD-10-CM

## 2019-04-27 DIAGNOSIS — Z780 Asymptomatic menopausal state: Secondary | ICD-10-CM

## 2019-04-27 MED ORDER — ESTRADIOL 1 MG PO TABS: 1.0000 mg | ORAL_TABLET | Freq: Every day | ORAL | 3 refills | Status: DC

## 2019-04-27 MED ORDER — MEDROXYPROGESTERONE ACETATE 5 MG PO TABS: 5.0000 mg | ORAL_TABLET | Freq: Every day | ORAL | 3 refills | Status: DC

## 2019-04-27 MED ORDER — NYSTATIN 100000 UNIT/GM EX CREA: 1.0000 "application " | TOPICAL_CREAM | Freq: Two times a day (BID) | CUTANEOUS | 0 refills | Status: DC

## 2019-04-27 NOTE — Progress Notes (Addendum)
GYN presents for AEX/PAP.  Declines Flu vaccine. Request refills for Diflucan and Nystatin. Next Mammogram 05/16/2019  GAD-7=10

## 2019-04-27 NOTE — Progress Notes (Signed)
Patient ID: Stephanie Joseph, female   DOB: 20-May-1961, 58 y.o.   MRN: [id number]  Chief Complaint  Patient presents with  . Gynecologic Exam    HPI Stephanie Joseph is a 58 y.o. female.  [id number] Patient's last menstrual period was 02/13/2014 (within days). Postmenopausal currently using HRT and she requests refills.  HPI  Past Medical History:  Diagnosis Date  . Anemia, iron deficiency   . Arthritis   . Depression   . Diabetes mellitus without complication ([facility])   . Dysmenorrhea   . Gout   . Heart valve problem   . Hypertension   . Menorrhagia   . Mitral regurgitation   . Pneumonia     Past Surgical History:  Procedure Laterality Date  . KNEE ARTHROSCOPY     Left    Family History  Problem Relation Age of Onset  . Hypertension Mother   . Stroke Paternal Grandmother   . Heart disease Sister   . Heart disease Brother     Social History Social History   Tobacco Use  . Smoking status: Current Every Day Smoker    Packs/day: 0.50    Years: 7.00    Pack years: 3.50    Types: Cigarettes  . Smokeless tobacco: Never Used  Substance Use Topics  . Alcohol use: No  . Drug use: No    Types: "Crack" cocaine    No Known Allergies  Current Outpatient Medications  Medication Sig Dispense Refill  . amLODipine (NORVASC) 10 MG tablet Take 1 tablet (10 mg total) by mouth daily. 90 tablet 3  . atorvastatin (LIPITOR) 10 MG tablet Take 1 tablet (10 mg total) by mouth daily. 90 tablet 3  . betamethasone dipropionate (DIPROLENE) 0.05 % ointment Apply topically 2 (two) times daily. 30 g 0  . brimonidine-timolol (COMBIGAN) 0.2-0.5 % ophthalmic solution Place 1 drop into both eyes every 12 (twelve) hours.    . diclofenac sodium (VOLTAREN) 1 % GEL Apply 2 g topically 4 (four) times daily. 100 g 3  . empagliflozin (JARDIANCE) 10 MG TABS tablet Take 10 mg by mouth daily. 90 tablet 1  . estradiol (ESTRACE) 1 MG tablet Take 1 tablet (1 mg total) by mouth daily. 90 tablet 3  .  fluconazole (DIFLUCAN) 100 MG tablet Take 1 tablet (100 mg total) by mouth daily. 7 tablet 0  . glucose blood (ACCU-CHEK AVIVA) test strip Use to check blood sugars 1-3 times daily. Dx Code: E11.9 300 each 1  . hydrOXYzine (ATARAX/VISTARIL) 25 MG tablet Take 1 tablet (25 mg total) by mouth every 8 (eight) hours as needed for itching. 30 tablet 0  . Lancets (ACCU-CHEK SOFT TOUCH) lancets Use as instructed 100 each 12  . losartan-hydrochlorothiazide (HYZAAR) 100-12.5 MG tablet Take 1 tablet by mouth daily. 90 tablet 3  . medroxyPROGESTERone (PROVERA) 5 MG tablet Take 1 tablet (5 mg total) by mouth daily. 90 tablet 3  . metFORMIN (GLUCOPHAGE) 500 MG tablet Take 2 tablets (1,000 mg total) by mouth 2 (two) times daily with a meal. 120 tablet 1  . methocarbamol (ROBAXIN) 500 MG tablet Take 1 tablet (500 mg total) by mouth every 8 (eight) hours as needed for muscle spasms. 90 tablet 0  . Misc. Devices (CANE) MISC Use as needed for left knee pain. 1 each 0  . naproxen (NAPROSYN) 375 MG tablet Take 1 tablet (375 mg total) by mouth 2 (two) times daily. 60 tablet 0  . nystatin cream (MYCOSTATIN) Apply 1 application topically 2 (two) times daily.  30 g 0  . QUEtiapine (SEROQUEL) 50 MG tablet Take 50 mg by mouth at bedtime.    . traMADol (ULTRAM) 50 MG tablet Take 1 tablet (50 mg total) by mouth every 6 (six) hours as needed. 30 tablet 0  . venlafaxine (EFFEXOR) 75 MG tablet Take 75 mg by mouth 3 (three) times daily with meals.     No current facility-administered medications for this visit.     Review of Systems Review of Systems  Constitutional: Negative.   Respiratory: Negative.   Cardiovascular: Negative.   Gastrointestinal: Negative.   Genitourinary: Negative.     Blood pressure 132/85, pulse 89, height 5\' 7"  (1.702 m), weight 217 lb (98.4 kg), last menstrual period 02/13/2014.  Physical Exam Physical Exam Vitals signs and nursing note reviewed.  Constitutional:      Appearance: Normal  appearance.  HENT:     Nose: Nose normal.  Eyes:     Pupils: Pupils are equal, round, and reactive to light.  Neck:     Musculoskeletal: Normal range of motion.  Cardiovascular:     Rate and Rhythm: Normal rate.  Pulmonary:     Effort: Pulmonary effort is normal.  Abdominal:     General: There is no distension.     Tenderness: There is no abdominal tenderness.  Genitourinary:    General: Normal vulva.     Vagina: No vaginal discharge.     Comments: Pelvic exam: normal external genitalia, vulva, vagina, cervix, uterus and adnexa.  Skin:    General: Skin is warm and dry.  Neurological:     General: No focal deficit present.     Mental Status: She is alert.  Psychiatric:        Mood and Affect: Mood normal.   Breasts: breasts appear normal, no suspicious masses, no skin or nipple changes or axillary nodes.   Data Reviewed Pap 2016 nl  Assessment Well woman exam today is nl, postmenopausal   Plan ERT and progesterone Mammogram in November Yearly exam encouraged    Emeterio Reeve 04/27/2019, 2:02 PM

## 2019-04-27 NOTE — Patient Instructions (Signed)
Mammogram A mammogram is an X-ray of the breasts that is done to check for changes that are not normal. This test can screen for and find any changes that may suggest breast cancer. Mammograms are regularly done on women. A man may have a mammogram if he has a lump or swelling in his breast. This test can also help to find other changes and variations in the breast. Tell a doctor:  About any allergies you have.  If you have breast implants.  If you have had previous breast disease, biopsy, or surgery.  If you are breastfeeding.  If you are younger than age 25.  If you have a family history of breast cancer.  Whether you are pregnant or may be pregnant. What are the risks? Generally, this is a safe procedure. However, problems may occur, including:  Exposure to radiation. Radiation levels are very low with this test.  The results being misinterpreted.  The need for further tests.  The inability of the mammogram to detect certain cancers. What happens before the procedure?  Have this test done about 1-2 weeks after your period. This is usually when your breasts are the least tender.  If you are visiting a new doctor or clinic, send any past mammogram images to your new doctor's office.  Wash your breasts and under your arms the day of the test.  Do not use deodorants, perfumes, lotions, or powders on the day of the test.  Take off any jewelry from your neck.  Wear clothes that you can change into and out of easily. What happens during the procedure?   You will undress from the waist up. You will put on a gown.  You will stand in front of the X-ray machine.  Each breast will be placed between two plastic or glass plates. The plates will press down on your breast for a few seconds. Try to stay as relaxed as possible. This does not cause any harm to your breasts. Any discomfort you feel will be very brief.  X-rays will be taken from different angles of each breast. The  procedure may vary among doctors and hospitals. What happens after the procedure?  The mammogram will be read by a specialist (radiologist).  You may need to do certain parts of the test again. This depends on the quality of the images.  Ask when your test results will be ready. Make sure you get your test results.  You may go back to your normal activities. Summary  A mammogram is a low energy X-ray of the breasts that is done to check for abnormal changes. A man may have this test if he has a lump or swelling in his breast.  Before the procedure, tell your doctor about any breast problems that you have had in the past.  Have this test done about 1-2 weeks after your period.  For the test, each breast will be placed between two plastic or glass plates. The plates will press down on your breast for a few seconds.  The mammogram will be read by a specialist (radiologist). Ask when your test results will be ready. Make sure you get your test results. This information is not intended to replace advice given to you by your health care provider. Make sure you discuss any questions you have with your health care provider. Document Released: 09/19/2008 Document Revised: 02/11/2018 Document Reviewed: 02/11/2018 Elsevier Patient Education  2020 [employer]  

## 2019-05-02 LAB — CYTOLOGY - PAP
Adequacy: ABSENT
Comment: NEGATIVE
Diagnosis: NEGATIVE
High risk HPV: NEGATIVE

## 2019-05-03 ENCOUNTER — Telehealth: Payer: Self-pay

## 2019-05-03 NOTE — Telephone Encounter (Signed)
Message sent to patient this morning via my-chart and also left her a message to return call to clinic to discuss.

## 2019-05-10 ENCOUNTER — Ambulatory Visit (INDEPENDENT_AMBULATORY_CARE_PROVIDER_SITE_OTHER): Admitting: Family

## 2019-05-10 ENCOUNTER — Other Ambulatory Visit: Payer: Self-pay | Admitting: Family

## 2019-05-10 ENCOUNTER — Encounter: Payer: Self-pay | Admitting: Family

## 2019-05-10 ENCOUNTER — Other Ambulatory Visit (INDEPENDENT_AMBULATORY_CARE_PROVIDER_SITE_OTHER)

## 2019-05-10 ENCOUNTER — Other Ambulatory Visit: Payer: Self-pay

## 2019-05-10 VITALS — BP 138/78 | HR 82 | Temp 98.5°F | Ht 67.0 in | Wt 216.0 lb

## 2019-05-10 DIAGNOSIS — R200 Anesthesia of skin: Secondary | ICD-10-CM

## 2019-05-10 DIAGNOSIS — M545 Low back pain, unspecified: Secondary | ICD-10-CM

## 2019-05-10 DIAGNOSIS — E559 Vitamin D deficiency, unspecified: Secondary | ICD-10-CM

## 2019-05-10 DIAGNOSIS — R202 Paresthesia of skin: Secondary | ICD-10-CM | POA: Diagnosis not present

## 2019-05-10 DIAGNOSIS — G8929 Other chronic pain: Secondary | ICD-10-CM

## 2019-05-10 LAB — COMPREHENSIVE METABOLIC PANEL
ALT: 6 U/L (ref 0–35)
AST: 9 U/L (ref 0–37)
Albumin: 4.2 g/dL (ref 3.5–5.2)
Alkaline Phosphatase: 76 U/L (ref 39–117)
BUN: 13 mg/dL (ref 6–23)
CO2: 27 mEq/L (ref 19–32)
Calcium: 9.2 mg/dL (ref 8.4–10.5)
Chloride: 104 mEq/L (ref 96–112)
Creatinine, Ser: 0.91 mg/dL (ref 0.40–1.20)
GFR: 76.69 mL/min (ref >60.00)
Glucose, Bld: 148 mg/dL — ABNORMAL HIGH (ref 70–99)
Potassium: 3.6 mEq/L (ref 3.5–5.1)
Sodium: 139 mEq/L (ref 135–145)
Total Bilirubin: 0.3 mg/dL (ref 0.2–1.2)
Total Protein: 7.2 g/dL (ref 6.0–8.3)

## 2019-05-10 LAB — VITAMIN D 25 HYDROXY (VIT D DEFICIENCY, FRACTURES): VITD: 24.95 ng/mL — ABNORMAL LOW (ref 30.00–100.00)

## 2019-05-10 LAB — TSH: TSH: 0.59 u[IU]/mL (ref 0.35–4.50)

## 2019-05-10 LAB — VITAMIN B12: Vitamin B-12: 167 pg/mL — ABNORMAL LOW (ref 211–911)

## 2019-05-10 LAB — MAGNESIUM: Magnesium: 1.9 mg/dL (ref 1.5–2.5)

## 2019-05-10 MED ORDER — VITAMIN D (ERGOCALCIFEROL) 1.25 MG (50000 UNIT) PO CAPS: 50000.0000 [IU] | ORAL_CAPSULE | ORAL | 0 refills | Status: AC

## 2019-05-10 NOTE — Progress Notes (Signed)
Stephanie Joseph is a 58 y.o. female with the following history as recorded in EpicCare:  Patient Active Problem List   Diagnosis Date Noted  . Chronic midline low back pain 07/12/2018  . Acute pharyngitis 09/16/2017  . Abnormal TSH 09/16/2017  . Glaucoma 05/21/2017  . Tooth pain 04/28/2017  . Encounter for general adult medical examination with abnormal findings 09/19/2016  . Class 1 obesity due to excess calories with serious comorbidity and body mass index (BMI) of 33.0 to 33.9 in adult 09/19/2016  . Vitamin D deficiency 09/19/2016  . Urinary frequency 02/14/2016  . Back pain 10/18/2015  . Type 2 diabetes mellitus with hyperglycemia, without long-term current use of insulin ([place]) 07/10/2015  . Hormone replacement therapy (postmenopausal) 10/04/2014  . Mitral regurgitation 11/05/2012  . Aortic valve disease 10/21/2011  . Menorrhagia, premenopausal 09/01/2011  . Depression 03/04/2010  . GOUT 12/16/2006  . ANEMIA, IRON DEFICIENCY NOS 12/16/2006  . TOBACCO ABUSE 12/16/2006  . Essential hypertension 12/16/2006    Current Outpatient Medications  Medication Sig Dispense Refill  . amLODipine (NORVASC) 10 MG tablet Take 1 tablet (10 mg total) by mouth daily. 90 tablet 3  . atorvastatin (LIPITOR) 10 MG tablet Take 1 tablet (10 mg total) by mouth daily. 90 tablet 3  . betamethasone dipropionate (DIPROLENE) 0.05 % ointment Apply topically 2 (two) times daily. 30 g 0  . brimonidine-timolol (COMBIGAN) 0.2-0.5 % ophthalmic solution Place 1 drop into both eyes every 12 (twelve) hours.    . diclofenac sodium (VOLTAREN) 1 % GEL Apply 2 g topically 4 (four) times daily. 100 g 3  . empagliflozin (JARDIANCE) 10 MG TABS tablet Take 10 mg by mouth daily. 90 tablet 1  . estradiol (ESTRACE) 1 MG tablet Take 1 tablet (1 mg total) by mouth daily. 90 tablet 3  . glucose blood (ACCU-CHEK AVIVA) test strip Use to check blood sugars 1-3 times daily. Dx Code: E11.9 300 each 1  . hydrOXYzine (ATARAX/VISTARIL)  25 MG tablet Take 1 tablet (25 mg total) by mouth every 8 (eight) hours as needed for itching. 30 tablet 0  . Lancets (ACCU-CHEK SOFT TOUCH) lancets Use as instructed 100 each 12  . losartan-hydrochlorothiazide (HYZAAR) 100-12.5 MG tablet Take 1 tablet by mouth daily. 90 tablet 3  . medroxyPROGESTERone (PROVERA) 5 MG tablet Take 1 tablet (5 mg total) by mouth daily. 90 tablet 3  . metFORMIN (GLUCOPHAGE) 500 MG tablet Take 2 tablets (1,000 mg total) by mouth 2 (two) times daily with a meal. 120 tablet 1  . methocarbamol (ROBAXIN) 500 MG tablet Take 1 tablet (500 mg total) by mouth every 8 (eight) hours as needed for muscle spasms. 90 tablet 0  . Misc. Devices (CANE) MISC Use as needed for left knee pain. 1 each 0  . naproxen (NAPROSYN) 375 MG tablet Take 1 tablet (375 mg total) by mouth 2 (two) times daily. 60 tablet 0  . nystatin cream (MYCOSTATIN) Apply 1 application topically 2 (two) times daily. 30 g 0  . QUEtiapine (SEROQUEL) 50 MG tablet Take 50 mg by mouth at bedtime.    . traMADol (ULTRAM) 50 MG tablet Take 1 tablet (50 mg total) by mouth every 6 (six) hours as needed. 30 tablet 0  . venlafaxine (EFFEXOR) 75 MG tablet Take 75 mg by mouth 3 (three) times daily with meals.    . Vitamin D, Ergocalciferol, (DRISDOL) 1.25 MG (50000 UT) CAPS capsule Take 1 capsule (50,000 Units total) by mouth every 7 (seven) days for 12 doses. 12  capsule 0   No current facility-administered medications for this visit.     Allergies: Patient has no known allergies.  Past Medical History:  Diagnosis Date  . Anemia, iron deficiency   . Arthritis   . Depression   . Diabetes mellitus without complication ([place])   . Dysmenorrhea   . Gout   . Heart valve problem   . Hypertension   . Menorrhagia   . Mitral regurgitation   . Pneumonia     Past Surgical History:  Procedure Laterality Date  . KNEE ARTHROSCOPY     Left    Family History  Problem Relation Age of Onset  . Hypertension Mother   . Stroke  Paternal Grandmother   . Heart disease Sister   . Heart disease Brother     Social History   Tobacco Use  . Smoking status: Current Every Day Smoker    Packs/day: 0.50    Years: 7.00    Pack years: 3.50    Types: Cigarettes  . Smokeless tobacco: Never Used  Substance Use Topics  . Alcohol use: No    Subjective:  Requesting to have B12 level checked; has had deficiency in the past- having similar symptoms; numbness and tingling in right hand; saw ortho in the past year and no concerns for carpal tunnel;  Also asking to get updated MRI of low back; known chronic back issues- feels she may want to pursue injections.  Objective:  Vitals:   05/10/19 1025  BP: 138/78  Pulse: 82  Temp: 98.5 F (36.9 C)  TempSrc: Oral  SpO2: 99%  Weight: 216 lb (98 kg)  Height: '5\' 7"'  (1.702 m)    General: Well developed, well nourished, in no acute distress  Skin : Warm and dry.  Head: Normocephalic and atraumatic  Lungs: Respirations unlabored;  Musculoskeletal: No deformities; no active joint inflammation  Extremities: No edema, cyanosis, clubbing  Vessels: Symmetric bilaterally  Neurologic: Alert and oriented; speech intact; face symmetrical; moves all extremities well; CNII-XII intact without focal deficit   Assessment:  1. Numbness and tingling in right hand   2. Vitamin D deficiency   3. Chronic midline low back pain, unspecified whether sciatica present     Plan:  1. History of B12 deficiency- check B12 level today; follow-up to be determined; if labs are normal, will need to go back to orthopedist; 2. Check Vitamin D level; 3. Update lumbar MRI- last MRI was done in April 2019; will plan to refer to orthopedist to discuss injections;   No follow-ups on file.  Orders Placed This Encounter  Procedures  . MR Lumbar Spine Wo Contrast    Standing Status:   Future    Standing Expiration Date:   07/09/2020    Order Specific Question:   What is the patient's sedation requirement?     Answer:   No Sedation    Order Specific Question:   Does the patient have a pacemaker or implanted devices?    Answer:   No    Order Specific Question:   Preferred imaging location?    Answer:   [facility] (table limit-550lbs)    Order Specific Question:   Radiology Contrast Protocol - do NOT remove file path    Answer:   \\[email or web address]  . B12    Standing Status:   Future    Number of Occurrences:   1    Standing Expiration Date:   05/09/2020  . Magnesium    Standing Status:  Future    Number of Occurrences:   1    Standing Expiration Date:   05/09/2020  . Comp Met (CMET)    Standing Status:   Future    Number of Occurrences:   1    Standing Expiration Date:   05/09/2020  . TSH    Standing Status:   Future    Number of Occurrences:   1    Standing Expiration Date:   05/09/2020  . Vitamin D (25 hydroxy)    Standing Status:   Future    Number of Occurrences:   1    Standing Expiration Date:   05/09/2020    Requested Prescriptions    No prescriptions requested or ordered in this encounter

## 2019-05-16 ENCOUNTER — Other Ambulatory Visit: Payer: Self-pay

## 2019-05-16 ENCOUNTER — Ambulatory Visit
Admission: RE | Admit: 2019-05-16 | Discharge: 2019-05-16 | Disposition: A | Discharge status: Home / Self Care | Source: Ambulatory Visit | Attending: Family | Admitting: Family

## 2019-05-16 DIAGNOSIS — Z1231 Encounter for screening mammogram for malignant neoplasm of breast: Secondary | ICD-10-CM

## 2019-05-20 ENCOUNTER — Telehealth: Payer: Self-pay

## 2019-05-20 ENCOUNTER — Other Ambulatory Visit: Payer: Self-pay

## 2019-05-20 ENCOUNTER — Ambulatory Visit (INDEPENDENT_AMBULATORY_CARE_PROVIDER_SITE_OTHER)

## 2019-05-20 DIAGNOSIS — E538 Deficiency of other specified B group vitamins: Secondary | ICD-10-CM | POA: Diagnosis not present

## 2019-05-20 MED ORDER — CYANOCOBALAMIN 1000 MCG/ML IJ SOLN
1000.0000 ug | Freq: Once | INTRAMUSCULAR | Status: AC
  Administered 2019-05-20: 1000 ug via INTRAMUSCULAR

## 2019-05-20 NOTE — Telephone Encounter (Signed)
Let's see what the dentist finds- they can give her an antibiotic if it is necessary.

## 2019-05-23 NOTE — Telephone Encounter (Signed)
Message sent to patient via mychart

## 2019-05-23 NOTE — Telephone Encounter (Signed)
My-chart message read by patient today.

## 2019-05-23 NOTE — Progress Notes (Signed)
Agree with treatment 

## 2019-05-26 ENCOUNTER — Other Ambulatory Visit: Payer: Self-pay | Admitting: Family

## 2019-05-26 ENCOUNTER — Ambulatory Visit (INDEPENDENT_AMBULATORY_CARE_PROVIDER_SITE_OTHER)

## 2019-05-26 ENCOUNTER — Other Ambulatory Visit: Payer: Self-pay

## 2019-05-26 DIAGNOSIS — E538 Deficiency of other specified B group vitamins: Secondary | ICD-10-CM

## 2019-05-26 MED ORDER — CYANOCOBALAMIN 1000 MCG/ML IJ SOLN
1000.0000 ug | Freq: Once | INTRAMUSCULAR | Status: AC
  Administered 2019-05-26: 1000 ug via INTRAMUSCULAR

## 2019-05-26 NOTE — Progress Notes (Signed)
Medical screening examination/treatment/procedure(s) were performed by non-physician practitioner and as supervising physician I was immediately available for consultation/collaboration. I agree with above. Dwayne Begay, MD   

## 2019-06-01 ENCOUNTER — Ambulatory Visit (INDEPENDENT_AMBULATORY_CARE_PROVIDER_SITE_OTHER)

## 2019-06-01 ENCOUNTER — Other Ambulatory Visit: Payer: Self-pay

## 2019-06-01 DIAGNOSIS — E538 Deficiency of other specified B group vitamins: Secondary | ICD-10-CM | POA: Diagnosis not present

## 2019-06-01 MED ORDER — CYANOCOBALAMIN 1000 MCG/ML IJ SOLN
1000.0000 ug | Freq: Once | INTRAMUSCULAR | Status: AC
  Administered 2019-06-01: 1000 ug via INTRAMUSCULAR

## 2019-06-04 ENCOUNTER — Ambulatory Visit
Admission: RE | Admit: 2019-06-04 | Discharge: 2019-06-04 | Disposition: A | Discharge status: Home / Self Care | Source: Ambulatory Visit | Attending: Family | Admitting: Family

## 2019-06-04 ENCOUNTER — Other Ambulatory Visit: Payer: Self-pay

## 2019-06-04 DIAGNOSIS — M545 Low back pain, unspecified: Secondary | ICD-10-CM

## 2019-06-04 DIAGNOSIS — G8929 Other chronic pain: Secondary | ICD-10-CM

## 2019-06-06 ENCOUNTER — Other Ambulatory Visit: Payer: Self-pay | Admitting: Family

## 2019-06-06 DIAGNOSIS — M5417 Radiculopathy, lumbosacral region: Secondary | ICD-10-CM

## 2019-06-07 NOTE — Progress Notes (Signed)
Agree with treatment 

## 2019-06-10 ENCOUNTER — Ambulatory Visit

## 2019-06-10 ENCOUNTER — Other Ambulatory Visit: Payer: Self-pay

## 2019-06-10 ENCOUNTER — Telehealth: Payer: Self-pay

## 2019-06-10 DIAGNOSIS — E538 Deficiency of other specified B group vitamins: Secondary | ICD-10-CM

## 2019-06-10 MED ORDER — CYANOCOBALAMIN 1000 MCG/ML IJ SOLN: 1000.0000 ug | Freq: Once | INTRAMUSCULAR | Status: DC

## 2019-06-10 NOTE — Telephone Encounter (Signed)
Let's have her check the level in about 2 weeks; just plan to come here.

## 2019-06-10 NOTE — Telephone Encounter (Signed)
Spoke with patient and info given 

## 2019-06-20 ENCOUNTER — Encounter: Payer: Self-pay | Admitting: Family

## 2019-06-22 ENCOUNTER — Other Ambulatory Visit (INDEPENDENT_AMBULATORY_CARE_PROVIDER_SITE_OTHER)

## 2019-06-22 DIAGNOSIS — E538 Deficiency of other specified B group vitamins: Secondary | ICD-10-CM | POA: Diagnosis not present

## 2019-06-22 LAB — VITAMIN B12: Vitamin B-12: 333 pg/mL (ref 211–911)

## 2019-06-24 ENCOUNTER — Other Ambulatory Visit: Payer: Self-pay | Admitting: Family

## 2019-06-24 DIAGNOSIS — M545 Low back pain, unspecified: Secondary | ICD-10-CM

## 2019-06-24 DIAGNOSIS — G8929 Other chronic pain: Secondary | ICD-10-CM

## 2019-06-24 MED ORDER — METHOCARBAMOL 500 MG PO TABS: 500.0000 mg | ORAL_TABLET | Freq: Three times a day (TID) | ORAL | 0 refills | Status: DC | PRN

## 2019-06-24 MED ORDER — NAPROXEN 375 MG PO TABS: 375.0000 mg | ORAL_TABLET | Freq: Two times a day (BID) | ORAL | 0 refills | Status: DC

## 2019-06-29 ENCOUNTER — Telehealth: Payer: Self-pay | Admitting: Family

## 2019-06-29 MED ORDER — FLUCONAZOLE 150 MG PO TABS: 150.0000 mg | ORAL_TABLET | Freq: Once | ORAL | 0 refills | Status: AC

## 2019-06-29 NOTE — Telephone Encounter (Signed)
Patient called and would like to talk to Jodi Mourning or her CMA about a yeast infection that has come back and she thinks its related to a medication she is currently taking. Please call patient back, thanks.

## 2019-06-29 NOTE — Telephone Encounter (Signed)
Yes, we should probably stop the Jardiance with the recurrent yeast infections. I would recommend weekly injection called Trulicity. If she won't do the injection, we can try oral Rybelsus but that can have some nausea initially.  I sent in the Diflucan to treat a yeast infection for her.

## 2019-06-30 ENCOUNTER — Other Ambulatory Visit: Payer: Self-pay | Admitting: Family

## 2019-06-30 MED ORDER — RYBELSUS 3 MG PO TABS: 3.0000 mg | ORAL_TABLET | Freq: Every day | ORAL | 1 refills | Status: DC

## 2019-06-30 NOTE — Telephone Encounter (Signed)
Stop the Jardiance; Rybelsus is a medication we typically taper up; start with 30 mg daily x 30 days and then let us know how she does. We will most likely taper up to 7 mg daily.

## 2019-06-30 NOTE — Telephone Encounter (Signed)
Mychart message sent.

## 2019-07-14 ENCOUNTER — Other Ambulatory Visit: Payer: Self-pay | Admitting: Family

## 2019-07-14 NOTE — Telephone Encounter (Signed)
Medication Refill - Medication: Semaglutide (RYBELSUS) 3 MG TABS [[id number]]    Preferred Pharmacy (with phone number or street name):  [facility] [address] [place], [place] - [address]  [address] [place] [address]  Phone: [phone] Fax: [phone]     Agent: Please be advised that RX refills may take up to 3 business days. We ask that you follow-up with your pharmacy.

## 2019-07-14 NOTE — Telephone Encounter (Signed)
Requested medication (s) are due for refill today: yes  Requested medication (s) are on the active medication list: yes  Last refill:  06/30/2019  Future visit scheduled: no  Notes to clinic: Medication not assigned to a protocol, review manually   Requested Prescriptions  Pending Prescriptions Disp Refills   Semaglutide (RYBELSUS) 3 MG TABS 30 tablet 1    Sig: Take 3 mg by mouth daily.      Off-Protocol Failed - 07/14/2019  1:15 PM      Failed - Medication not assigned to a protocol, review manually.      Passed - Valid encounter within last 12 months    Recent Outpatient Visits           2 months ago Numbness and tingling in right hand   [facility] -Shanna Cisco, Allyne Gee, FNP   3 months ago Type 2 diabetes mellitus without complication, without long-term current use of insulin [facility])   [facility] -Shanna Cisco, Allyne Gee, FNP   4 months ago Type 2 diabetes mellitus without complication, without long-term current use of insulin [facility])   [facility] -Shanna Cisco, Allyne Gee, FNP   7 months ago Type 2 diabetes mellitus without complication, without long-term current use of insulin [facility])   [facility] -Shanna Cisco, Allyne Gee, FNP   1 year ago Type 2 diabetes mellitus without complication, without long-term current use of insulin ([facility])    [facility] -[facility] Evaristo Bury, NP       Future Appointments             In 1 month Eden Emms, Noralyn Pick, MD [facility], [facility]

## 2019-07-15 ENCOUNTER — Other Ambulatory Visit: Payer: Self-pay | Admitting: Family

## 2019-07-15 ENCOUNTER — Encounter: Payer: Self-pay | Admitting: Family

## 2019-07-15 MED ORDER — RYBELSUS 3 MG PO TABS: 3.0000 mg | ORAL_TABLET | Freq: Every day | ORAL | 0 refills | Status: DC

## 2019-07-15 NOTE — Telephone Encounter (Signed)
Please call to see her response to the medication. I need to increase the dose to 7 mg which is the standard treatment dose.

## 2019-07-15 NOTE — Telephone Encounter (Signed)
Pt calling to check status  °

## 2019-07-17 ENCOUNTER — Encounter: Payer: Self-pay | Admitting: Family

## 2019-07-18 ENCOUNTER — Other Ambulatory Visit: Payer: Self-pay | Admitting: Family

## 2019-07-18 MED ORDER — GLIMEPIRIDE 2 MG PO TABS: 2.0000 mg | ORAL_TABLET | Freq: Every day | ORAL | 3 refills | Status: DC

## 2019-08-26 ENCOUNTER — Ambulatory Visit: Admitting: Family

## 2019-08-29 NOTE — Progress Notes (Signed)
Patient ID: Stephanie Joseph, female   DOB: Aug 16, 1960, 59 y.o.   MRN: [id number]      59 y.o. followed for murmur since 2015. Last echo done 12/17/17 showed mild AR/MR with EF 50-55% CRF;s Include DM, HLD and HTN.   Husband doing better not running off and using crack  No cardiac symptoms   She is not working has back issues BS/BP up a bit due to being on prednisone  Has daughter at home who works at [employer]  ROS: Denies fever, malais, weight loss, blurry vision, decreased visual acuity, cough, sputum, SOB, hemoptysis, pleuritic pain, palpitaitons, heartburn, abdominal pain, melena, lower extremity edema, claudication, or rash.  All other systems reviewed and negative  General: BP 130/88   Pulse 67   Ht 5\' 7"  (1.702 m)   Wt 226 lb (102.5 kg)   LMP 02/13/2014 (Within Days)   SpO2 98%   BMI 35.40 kg/m  Affect appropriate Overweight black female  HEENT: normal Neck supple with no adenopathy JVP normal no bruits no thyromegaly Lungs clear with no wheezing and good diaphragmatic motion Heart:  S1/S2 AR murmur , no rub, gallop or click PMI normal Abdomen: benighn, BS positve, no tenderness, no AAA no bruit.  No HSM or HJR Distal pulses intact with no bruits No edema Neuro non-focal Skin warm and dry No muscular weakness    Current Outpatient Medications  Medication Sig Dispense Refill  . amLODipine (NORVASC) 10 MG tablet Take 1 tablet (10 mg total) by mouth daily. 90 tablet 3  . atorvastatin (LIPITOR) 10 MG tablet Take 1 tablet (10 mg total) by mouth daily. 90 tablet 3  . betamethasone dipropionate (DIPROLENE) 0.05 % ointment Apply topically 2 (two) times daily. 30 g 0  . brimonidine-timolol (COMBIGAN) 0.2-0.5 % ophthalmic solution Place 1 drop into both eyes every 12 (twelve) hours.    . diclofenac sodium (VOLTAREN) 1 % GEL Apply 2 g topically 4 (four) times daily. 100 g 3  . estradiol (ESTRACE) 1 MG tablet Take 1 tablet (1 mg total) by mouth daily. 90 tablet 3   . glimepiride (AMARYL) 2 MG tablet Take 1 tablet (2 mg total) by mouth daily before breakfast. 30 tablet 3  . glucose blood (ACCU-CHEK AVIVA) test strip Use to check blood sugars 1-3 times daily. Dx Code: E11.9 300 each 1  . hydrOXYzine (ATARAX/VISTARIL) 25 MG tablet Take 1 tablet (25 mg total) by mouth every 8 (eight) hours as needed for itching. 30 tablet 0  . Lancets (ACCU-CHEK SOFT TOUCH) lancets Use as instructed 100 each 12  . losartan-hydrochlorothiazide (HYZAAR) 100-12.5 MG tablet Take 1 tablet by mouth daily. 90 tablet 3  . medroxyPROGESTERone (PROVERA) 5 MG tablet Take 1 tablet (5 mg total) by mouth daily. 90 tablet 3  . metFORMIN (GLUCOPHAGE) 500 MG tablet TAKE 2 TABLETS BY MOUTH TWICE DAILY WITH A MEAL 120 tablet 0  . methocarbamol (ROBAXIN) 500 MG tablet Take 1 tablet (500 mg total) by mouth every 8 (eight) hours as needed for muscle spasms. 90 tablet 0  . Misc. Devices (CANE) MISC Use as needed for left knee pain. 1 each 0  . naproxen (NAPROSYN) 375 MG tablet Take 1 tablet (375 mg total) by mouth 2 (two) times daily. 60 tablet 0  . nystatin cream (MYCOSTATIN) Apply 1 application topically 2 (two) times daily. 30 g 0  . predniSONE (DELTASONE) 20 MG tablet Take 1 tablet (20 mg total) by mouth daily with breakfast. 5 tablet 0  .  QUEtiapine (SEROQUEL) 50 MG tablet Take 50 mg by mouth at bedtime.    . traMADol (ULTRAM) 50 MG tablet Take 1 tablet (50 mg total) by mouth every 6 (six) hours as needed. 30 tablet 0  . venlafaxine (EFFEXOR) 75 MG tablet Take 75 mg by mouth 3 (three) times daily with meals.     Current Facility-Administered Medications  Medication Dose Route Frequency Provider Last Rate Last Admin  . cyanocobalamin ((VITAMIN B-12)) injection 1,000 mcg  1,000 mcg Intramuscular Once Marrian Salvage, FNP        Allergies  Patient has no known allergies.  Electrocardiogram:  09/01/19 SR rate 66 nonspecific ST changes   Assessment and Plan  MR /AR no change in  murmur   TTE 12/17/17 mild AR and MR no need for SBE No need for echo  Since no symptoms and no change in murmur    Bipolar: stable continue current meds  VV:[id number] low carb diet.  Target hemoglobin A1c is 6.5 or less.  Continue current medications.  HTN: Well controlled.  Continue current medications and low sodium Dash type diet.    HLD:  Continue statin labs with primary   F/u with me in a year if echo ok    Jenkins Rouge

## 2019-08-30 ENCOUNTER — Other Ambulatory Visit: Payer: Self-pay | Admitting: Family

## 2019-08-30 ENCOUNTER — Other Ambulatory Visit: Payer: Self-pay

## 2019-08-30 ENCOUNTER — Ambulatory Visit (INDEPENDENT_AMBULATORY_CARE_PROVIDER_SITE_OTHER): Admitting: Family

## 2019-08-30 ENCOUNTER — Encounter: Payer: Self-pay | Admitting: Family

## 2019-08-30 VITALS — BP 130/78 | HR 84 | Temp 98.0°F | Ht 67.0 in | Wt 225.8 lb

## 2019-08-30 DIAGNOSIS — M25552 Pain in left hip: Secondary | ICD-10-CM | POA: Diagnosis not present

## 2019-08-30 DIAGNOSIS — E538 Deficiency of other specified B group vitamins: Secondary | ICD-10-CM

## 2019-08-30 DIAGNOSIS — E1165 Type 2 diabetes mellitus with hyperglycemia: Secondary | ICD-10-CM

## 2019-08-30 LAB — COMPREHENSIVE METABOLIC PANEL
ALT: 7 U/L (ref 0–35)
AST: 9 U/L (ref 0–37)
Albumin: 4 g/dL (ref 3.5–5.2)
Alkaline Phosphatase: 66 U/L (ref 39–117)
BUN: 12 mg/dL (ref 6–23)
CO2: 28 mEq/L (ref 19–32)
Calcium: 9.1 mg/dL (ref 8.4–10.5)
Chloride: 104 mEq/L (ref 96–112)
Creatinine, Ser: 0.93 mg/dL (ref 0.40–1.20)
GFR: 74.71 mL/min (ref >60.00)
Glucose, Bld: 138 mg/dL — ABNORMAL HIGH (ref 70–99)
Potassium: 3.9 mEq/L (ref 3.5–5.1)
Sodium: 137 mEq/L (ref 135–145)
Total Bilirubin: 0.3 mg/dL (ref 0.2–1.2)
Total Protein: 6.9 g/dL (ref 6.0–8.3)

## 2019-08-30 LAB — VITAMIN B12: Vitamin B-12: 275 pg/mL (ref 211–911)

## 2019-08-30 MED ORDER — PREDNISONE 20 MG PO TABS: 20.0000 mg | ORAL_TABLET | Freq: Every day | ORAL | 0 refills | Status: DC

## 2019-08-30 NOTE — Progress Notes (Signed)
Stephanie Joseph is a 59 y.o. female with the following history as recorded in EpicCare:  Patient Active Problem List   Diagnosis Date Noted  . Chronic midline low back pain 07/12/2018  . Acute pharyngitis 09/16/2017  . Abnormal TSH 09/16/2017  . Glaucoma 05/21/2017  . Tooth pain 04/28/2017  . Encounter for general adult medical examination with abnormal findings 09/19/2016  . Class 1 obesity due to excess calories with serious comorbidity and body mass index (BMI) of 33.0 to 33.9 in adult 09/19/2016  . Vitamin D deficiency 09/19/2016  . Urinary frequency 02/14/2016  . Back pain 10/18/2015  . Type 2 diabetes mellitus with hyperglycemia, without long-term current use of insulin ([place]) 07/10/2015  . Hormone replacement therapy (postmenopausal) 10/04/2014  . Mitral regurgitation 11/05/2012  . Aortic valve disease 10/21/2011  . Menorrhagia, premenopausal 09/01/2011  . Depression 03/04/2010  . GOUT 12/16/2006  . ANEMIA, IRON DEFICIENCY NOS 12/16/2006  . TOBACCO ABUSE 12/16/2006  . Essential hypertension 12/16/2006    Current Outpatient Medications  Medication Sig Dispense Refill  . amLODipine (NORVASC) 10 MG tablet Take 1 tablet (10 mg total) by mouth daily. 90 tablet 3  . atorvastatin (LIPITOR) 10 MG tablet Take 1 tablet (10 mg total) by mouth daily. 90 tablet 3  . betamethasone dipropionate (DIPROLENE) 0.05 % ointment Apply topically 2 (two) times daily. 30 g 0  . brimonidine-timolol (COMBIGAN) 0.2-0.5 % ophthalmic solution Place 1 drop into both eyes every 12 (twelve) hours.    . diclofenac sodium (VOLTAREN) 1 % GEL Apply 2 g topically 4 (four) times daily. 100 g 3  . estradiol (ESTRACE) 1 MG tablet Take 1 tablet (1 mg total) by mouth daily. 90 tablet 3  . glimepiride (AMARYL) 2 MG tablet Take 1 tablet (2 mg total) by mouth daily before breakfast. 30 tablet 3  . glucose blood (ACCU-CHEK AVIVA) test strip Use to check blood sugars 1-3 times daily. Dx Code: E11.9 300 each 1  .  hydrOXYzine (ATARAX/VISTARIL) 25 MG tablet Take 1 tablet (25 mg total) by mouth every 8 (eight) hours as needed for itching. 30 tablet 0  . Lancets (ACCU-CHEK SOFT TOUCH) lancets Use as instructed 100 each 12  . losartan-hydrochlorothiazide (HYZAAR) 100-12.5 MG tablet Take 1 tablet by mouth daily. 90 tablet 3  . medroxyPROGESTERone (PROVERA) 5 MG tablet Take 1 tablet (5 mg total) by mouth daily. 90 tablet 3  . metFORMIN (GLUCOPHAGE) 500 MG tablet TAKE 2 TABLETS BY MOUTH TWICE DAILY WITH A MEAL 120 tablet 0  . methocarbamol (ROBAXIN) 500 MG tablet Take 1 tablet (500 mg total) by mouth every 8 (eight) hours as needed for muscle spasms. 90 tablet 0  . Misc. Devices (CANE) MISC Use as needed for left knee pain. 1 each 0  . naproxen (NAPROSYN) 375 MG tablet Take 1 tablet (375 mg total) by mouth 2 (two) times daily. 60 tablet 0  . nystatin cream (MYCOSTATIN) Apply 1 application topically 2 (two) times daily. 30 g 0  . QUEtiapine (SEROQUEL) 50 MG tablet Take 50 mg by mouth at bedtime.    . traMADol (ULTRAM) 50 MG tablet Take 1 tablet (50 mg total) by mouth every 6 (six) hours as needed. 30 tablet 0  . venlafaxine (EFFEXOR) 75 MG tablet Take 75 mg by mouth 3 (three) times daily with meals.    . predniSONE (DELTASONE) 20 MG tablet Take 1 tablet (20 mg total) by mouth daily with breakfast. 5 tablet 0   Current Facility-Administered Medications  Medication  Dose Route Frequency Provider Last Rate Last Admin  . cyanocobalamin ((VITAMIN B-12)) injection 1,000 mcg  1,000 mcg Intramuscular Once Marrian Salvage, FNP        Allergies: Patient has no known allergies.  Past Medical History:  Diagnosis Date  . Anemia, iron deficiency   . Arthritis   . Depression   . Diabetes mellitus without complication ([place])   . Dysmenorrhea   . Gout   . Heart valve problem   . Hypertension   . Menorrhagia   . Mitral regurgitation   . Pneumonia     Past Surgical History:  Procedure Laterality Date  . KNEE  ARTHROSCOPY     Left    Family History  Problem Relation Age of Onset  . Hypertension Mother   . Stroke Paternal Grandmother   . Heart disease Sister   . Heart disease Brother     Social History   Tobacco Use  . Smoking status: Current Every Day Smoker    Packs/day: 0.50    Years: 7.00    Pack years: 3.50    Types: Cigarettes  . Smokeless tobacco: Never Used  Substance Use Topics  . Alcohol use: No    Subjective:  Left hip pain- feels like pain is radiating from her back into her hip; has been referred to neurosurgeon for epidural injections- referral was done in November but patient has not scheduled at this time; is planning to call to schedule;   Agreeable to update labs for her diabetes- is only taking Metformin bid and tolerating Amaryl 2 mg daily; cost of Rybelsus was prohibitive and patient was not interested in any type of injectable medication. No concerns for episodes of low blood sugar;   Has been taking daily B12 supplement as well and has been feeling much better;     Objective:  Vitals:   08/30/19 0939  BP: 130/78  Pulse: 84  Temp: 98 F (36.7 C)  TempSrc: Oral  SpO2: 99%  Weight: 225 lb 12.8 oz (102.4 kg)  Height: '5\' 7"'  (1.702 m)    General: Well developed, well nourished, in no acute distress  Skin : Warm and dry.  Head: Normocephalic and atraumatic  Lungs: Respirations unlabored; clear to auscultation bilaterally without wheeze, rales, rhonchi  Musculoskeletal: No deformities; no active joint inflammation  Extremities: No edema, cyanosis, clubbing  Vessels: Symmetric bilaterally  Neurologic: Alert and oriented; speech intact; face symmetrical; moves all extremities well; CNII-XII intact without focal deficit   Assessment:  1. Type 2 diabetes mellitus with hyperglycemia, without long-term current use of insulin ([name])   2. B12 deficiency   3. Left hip pain     Plan:  1. Update labs today; may have to increase Amaryl to bid; patient has not done  well on higher dosages of Metformin; she does not want any type of injectable; needs to quit smoking; follow-up in 3-4 months; 2. Check B12 level today; 3. Rx for Prednisone 20 mg qd x 5 days; she needs to schedule for epidural steroid injections as have been discussed previously; she agrees to call;  This visit occurred during the SARS-CoV-2 public health emergency.  Safety protocols were in place, including screening questions prior to the visit, additional usage of staff PPE, and extensive cleaning of exam room while observing appropriate contact time as indicated for disinfecting solutions.     No follow-ups on file.  Orders Placed This Encounter  Procedures  . B12  . Comp Met (CMET)  . HgB  A1c    Requested Prescriptions   Signed Prescriptions Disp Refills  . predniSONE (DELTASONE) 20 MG tablet 5 tablet 0    Sig: Take 1 tablet (20 mg total) by mouth daily with breakfast.

## 2019-09-01 LAB — HEMOGLOBIN A1C: Hgb A1c MFr Bld: 7.7 % — ABNORMAL HIGH (ref 4.6–6.5)

## 2019-09-02 ENCOUNTER — Encounter (HOSPITAL_COMMUNITY): Payer: Self-pay

## 2019-09-02 ENCOUNTER — Other Ambulatory Visit: Payer: Self-pay

## 2019-09-02 ENCOUNTER — Ambulatory Visit (HOSPITAL_COMMUNITY)

## 2019-09-02 ENCOUNTER — Ambulatory Visit (INDEPENDENT_AMBULATORY_CARE_PROVIDER_SITE_OTHER): Admitting: Cardiovascular Disease

## 2019-09-02 ENCOUNTER — Encounter: Payer: Self-pay | Admitting: Cardiovascular Disease

## 2019-09-02 VITALS — BP 130/88 | HR 67 | Ht 67.0 in | Wt 226.0 lb

## 2019-09-02 DIAGNOSIS — R011 Cardiac murmur, unspecified: Secondary | ICD-10-CM | POA: Diagnosis not present

## 2019-09-02 DIAGNOSIS — I10 Essential (primary) hypertension: Secondary | ICD-10-CM

## 2019-09-02 NOTE — Patient Instructions (Signed)
Medication Instructions:  *If you need a refill on your cardiac medications before your next appointment, please call your pharmacy*  Lab Work: If you have labs (blood work) drawn today and your tests are completely normal, you will receive your results only by: . MyChart Message (if you have MyChart) OR . A paper copy in the mail If you have any lab test that is abnormal or we need to change your treatment, we will call you to review the results.  Follow-Up: At [facility], you and your health needs are our priority.  As part of our continuing mission to provide you with exceptional heart care, we have created designated Provider Care Teams.  These Care Teams include your primary Cardiologist (physician) and Advanced Practice Providers (APPs -  Physician Assistants and Nurse Practitioners) who all work together to provide you with the care you need, when you need it.  We recommend signing up for the patient portal called "MyChart".  Sign up information is provided on this After Visit Summary.  MyChart is used to connect with patients for Virtual Visits (Telemedicine).  Patients are able to view lab/test results, encounter notes, upcoming appointments, etc.  Non-urgent messages can be sent to your provider as well.   To learn more about what you can do with MyChart, go to [email or web address].    Your next appointment:   12 month(s)  The format for your next appointment:   In Person  Provider:   You may see Dr. Nishan or one of the following Advanced Practice Providers on your designated Care Team:    Lori Gerhardt, NP  Laura Ingold, NP  Jill McDaniel, NP   

## 2019-09-23 ENCOUNTER — Ambulatory Visit (INDEPENDENT_AMBULATORY_CARE_PROVIDER_SITE_OTHER)

## 2019-09-23 ENCOUNTER — Other Ambulatory Visit: Payer: Self-pay

## 2019-09-23 DIAGNOSIS — E538 Deficiency of other specified B group vitamins: Secondary | ICD-10-CM

## 2019-09-23 MED ORDER — CYANOCOBALAMIN 1000 MCG/ML IJ SOLN
1000.0000 ug | Freq: Once | INTRAMUSCULAR | Status: AC
  Administered 2019-09-23: 1000 ug via INTRAMUSCULAR

## 2019-09-28 NOTE — Progress Notes (Signed)
Agree with treatment 

## 2019-09-29 ENCOUNTER — Other Ambulatory Visit: Payer: Self-pay | Admitting: Family

## 2019-09-29 DIAGNOSIS — M545 Low back pain, unspecified: Secondary | ICD-10-CM

## 2019-09-29 DIAGNOSIS — G8929 Other chronic pain: Secondary | ICD-10-CM

## 2019-10-07 ENCOUNTER — Other Ambulatory Visit: Payer: Self-pay | Admitting: Family

## 2019-10-25 ENCOUNTER — Ambulatory Visit: Admitting: Family

## 2019-10-26 ENCOUNTER — Other Ambulatory Visit: Payer: Self-pay

## 2019-10-26 ENCOUNTER — Ambulatory Visit (INDEPENDENT_AMBULATORY_CARE_PROVIDER_SITE_OTHER): Admitting: *Deleted

## 2019-10-26 DIAGNOSIS — E538 Deficiency of other specified B group vitamins: Secondary | ICD-10-CM

## 2019-10-26 MED ORDER — CYANOCOBALAMIN 1000 MCG/ML IJ SOLN
1000.0000 ug | Freq: Once | INTRAMUSCULAR | Status: AC
  Administered 2019-10-26: 1000 ug via INTRAMUSCULAR

## 2019-10-26 NOTE — Progress Notes (Signed)
Pls cosign for B12 inj../lmb  

## 2019-11-22 ENCOUNTER — Other Ambulatory Visit: Payer: Self-pay | Admitting: Family

## 2019-11-25 ENCOUNTER — Other Ambulatory Visit: Payer: Self-pay

## 2019-11-25 ENCOUNTER — Ambulatory Visit (INDEPENDENT_AMBULATORY_CARE_PROVIDER_SITE_OTHER): Admitting: *Deleted

## 2019-11-25 DIAGNOSIS — E538 Deficiency of other specified B group vitamins: Secondary | ICD-10-CM | POA: Diagnosis not present

## 2019-11-25 MED ORDER — CYANOCOBALAMIN 1000 MCG/ML IJ SOLN
1000.0000 ug | Freq: Once | INTRAMUSCULAR | Status: AC
  Administered 2019-11-25: 1000 ug via INTRAMUSCULAR

## 2019-11-25 NOTE — Progress Notes (Signed)
Pls cosign for B12 inj../lmb  

## 2019-12-08 ENCOUNTER — Encounter (HOSPITAL_COMMUNITY): Payer: Self-pay | Admitting: Emergency Medicine

## 2019-12-08 ENCOUNTER — Emergency Department (HOSPITAL_COMMUNITY)
Admission: EM | Admit: 2019-12-08 | Discharge: 2019-12-08 | Disposition: A | Discharge status: Home / Self Care | Attending: Emergency Medicine | Admitting: Emergency Medicine

## 2019-12-08 ENCOUNTER — Emergency Department (HOSPITAL_COMMUNITY)

## 2019-12-08 ENCOUNTER — Other Ambulatory Visit: Payer: Self-pay

## 2019-12-08 DIAGNOSIS — I10 Essential (primary) hypertension: Secondary | ICD-10-CM | POA: Diagnosis not present

## 2019-12-08 DIAGNOSIS — Z7984 Long term (current) use of oral hypoglycemic drugs: Secondary | ICD-10-CM | POA: Diagnosis not present

## 2019-12-08 DIAGNOSIS — K2100 Gastro-esophageal reflux disease with esophagitis, without bleeding: Secondary | ICD-10-CM | POA: Diagnosis not present

## 2019-12-08 DIAGNOSIS — F17210 Nicotine dependence, cigarettes, uncomplicated: Secondary | ICD-10-CM | POA: Diagnosis not present

## 2019-12-08 DIAGNOSIS — Z79899 Other long term (current) drug therapy: Secondary | ICD-10-CM | POA: Insufficient documentation

## 2019-12-08 DIAGNOSIS — E119 Type 2 diabetes mellitus without complications: Secondary | ICD-10-CM | POA: Diagnosis not present

## 2019-12-08 DIAGNOSIS — R0789 Other chest pain: Secondary | ICD-10-CM | POA: Diagnosis not present

## 2019-12-08 DIAGNOSIS — R1013 Epigastric pain: Secondary | ICD-10-CM | POA: Diagnosis present

## 2019-12-08 LAB — CBC
HCT: 34.6 % — ABNORMAL LOW (ref 36.0–46.0)
Hemoglobin: 10.9 g/dL — ABNORMAL LOW (ref 12.0–15.0)
MCH: 27 pg (ref 26.0–34.0)
MCHC: 31.5 g/dL (ref 30.0–36.0)
MCV: 85.9 fL (ref 80.0–100.0)
Platelets: 296 10*3/uL (ref 150–400)
RBC: 4.03 MIL/uL (ref 3.87–5.11)
RDW: 15 % (ref 11.5–15.5)
WBC: 5.8 10*3/uL (ref 4.0–10.5)
nRBC: 0 % (ref 0.0–0.2)

## 2019-12-08 LAB — BASIC METABOLIC PANEL
Anion gap: 8 (ref 5–15)
BUN: 10 mg/dL (ref 6–20)
CO2: 27 mmol/L (ref 22–32)
Calcium: 8.7 mg/dL — ABNORMAL LOW (ref 8.9–10.3)
Chloride: 104 mmol/L (ref 98–111)
Creatinine, Ser: 0.78 mg/dL (ref 0.44–1.00)
GFR calc Af Amer: >60 mL/min (ref >60)
GFR calc non Af Amer: >60 mL/min (ref >60)
Glucose, Bld: 161 mg/dL — ABNORMAL HIGH (ref 70–99)
Potassium: 3.6 mmol/L (ref 3.5–5.1)
Sodium: 139 mmol/L (ref 135–145)

## 2019-12-08 LAB — TROPONIN I (HIGH SENSITIVITY): Troponin I (High Sensitivity): 3 ng/L (ref <18)

## 2019-12-08 LAB — I-STAT BETA HCG BLOOD, ED (NOT ORDERABLE): I-stat hCG, quantitative: <5 m[IU]/mL (ref <5)

## 2019-12-08 MED ORDER — FAMOTIDINE 20 MG PO TABS
40.0000 mg | ORAL_TABLET | Freq: Once | ORAL | Status: AC
  Administered 2019-12-08: 40 mg via ORAL
  Filled 2019-12-08: qty 2

## 2019-12-08 MED ORDER — ALUM & MAG HYDROXIDE-SIMETH 200-200-20 MG/5ML PO SUSP
30.0000 mL | Freq: Once | ORAL | Status: AC
  Administered 2019-12-08: 30 mL via ORAL
  Filled 2019-12-08: qty 30

## 2019-12-08 MED ORDER — LIDOCAINE VISCOUS HCL 2 % MT SOLN
15.0000 mL | Freq: Once | OROMUCOSAL | Status: AC
  Administered 2019-12-08: 15 mL via ORAL
  Filled 2019-12-08: qty 15

## 2019-12-08 MED ORDER — SODIUM CHLORIDE 0.9% FLUSH: 3.0000 mL | Freq: Once | INTRAVENOUS | Status: DC

## 2019-12-08 MED ORDER — PANTOPRAZOLE SODIUM 20 MG PO TBEC
20.0000 mg | DELAYED_RELEASE_TABLET | Freq: Two times a day (BID) | ORAL | 0 refills | Status: DC
Start: 2019-12-08 — End: 2020-01-27

## 2019-12-08 MED ORDER — SUCRALFATE 1 G PO TABS
1.0000 g | ORAL_TABLET | Freq: Four times a day (QID) | ORAL | 0 refills | Status: DC
Start: 2019-12-08 — End: 2020-03-06

## 2019-12-08 NOTE — ED Provider Notes (Signed)
[facility] DEPT Provider Note   CSN: [id number] Arrival date & time: 12/08/19  1527     History Chief Complaint  Patient presents with  . Chest Pain    Stephanie Joseph is a 59 y.o. female.  59 year old female presents with several days of constant epigastric pain that is worse at night when she lays flat.  Has been using antacids without relief.  Denies any exertional chest pain or chest pressure.  She has not been short of breath.  She has not had any coughing or leg pain or swelling.  States that symptoms did start after she ate a spicy chicken wing.  Denies any prior history of reflux        Past Medical History:  Diagnosis Date  . Anemia, iron deficiency   . Arthritis   . Depression   . Diabetes mellitus without complication ([place])   . Dysmenorrhea   . Gout   . Heart valve problem   . Hypertension   . Menorrhagia   . Mitral regurgitation   . Pneumonia     Patient Active Problem List   Diagnosis Date Noted  . Chronic midline low back pain 07/12/2018  . Acute pharyngitis 09/16/2017  . Abnormal TSH 09/16/2017  . Glaucoma 05/21/2017  . Tooth pain 04/28/2017  . Encounter for general adult medical examination with abnormal findings 09/19/2016  . Class 1 obesity due to excess calories with serious comorbidity and body mass index (BMI) of 33.0 to 33.9 in adult 09/19/2016  . Vitamin D deficiency 09/19/2016  . Urinary frequency 02/14/2016  . Back pain 10/18/2015  . Type 2 diabetes mellitus with hyperglycemia, without long-term current use of insulin ([place]) 07/10/2015  . Hormone replacement therapy (postmenopausal) 10/04/2014  . Mitral regurgitation 11/05/2012  . Aortic valve disease 10/21/2011  . Menorrhagia, premenopausal 09/01/2011  . Depression 03/04/2010  . GOUT 12/16/2006  . ANEMIA, IRON DEFICIENCY NOS 12/16/2006  . TOBACCO ABUSE 12/16/2006  . Essential hypertension 12/16/2006    Past Surgical History:  Procedure  Laterality Date  . KNEE ARTHROSCOPY     Left     OB History    Gravida  3   Para  1   Term      Preterm  1   AB  2   Living  1     SAB  1   TAB  1   Ectopic      Multiple      Live Births              Family History  Problem Relation Age of Onset  . Hypertension Mother   . Stroke Paternal Grandmother   . Heart disease Sister   . Heart disease Brother     Social History   Tobacco Use  . Smoking status: Current Every Day Smoker    Packs/day: 0.50    Years: 7.00    Pack years: 3.50    Types: Cigarettes  . Smokeless tobacco: Never Used  Substance Use Topics  . Alcohol use: No  . Drug use: No    Types: "Crack" cocaine    Home Medications Prior to Admission medications   Medication Sig Start Date End Date Taking? Authorizing Provider  amLODipine (NORVASC) 10 MG tablet Take 1 tablet (10 mg total) by mouth daily. 02/16/19   Marrian Salvage, FNP  atorvastatin (LIPITOR) 10 MG tablet Take 1 tablet (10 mg total) by mouth daily. 02/16/19   Marrian Salvage, [place]  betamethasone dipropionate (DIPROLENE) 0.05 % ointment Apply topically 2 (two) times daily. 11/21/18   Wallis Bamberg, PA-C  brimonidine-timolol (COMBIGAN) 0.2-0.5 % ophthalmic solution Place 1 drop into both eyes every 12 (twelve) hours.    [provider]  diclofenac sodium (VOLTAREN) 1 % GEL Apply 2 g topically 4 (four) times daily. 07/21/18   Kathryne Hitch, MD  estradiol (ESTRACE) 1 MG tablet Take 1 tablet (1 mg total) by mouth daily. 04/27/19   Adam Phenix, MD  glimepiride (AMARYL) 2 MG tablet TAKE 1 TABLET BY MOUTH ONCE DAILY BEFORE BREAKFAST 11/22/19   Olive Bass, FNP  glucose blood (ACCU-CHEK AVIVA) test strip Use to check blood sugars 1-3 times daily. Dx Code: E11.9 01/20/18   Olive Bass, FNP  hydrOXYzine (ATARAX/VISTARIL) 25 MG tablet Take 1 tablet (25 mg total) by mouth every 8 (eight) hours as needed for itching. 11/21/18   Wallis Bamberg, PA-C    Lancets (ACCU-CHEK SOFT TOUCH) lancets Use as instructed 05/21/17   Evaristo Bury, NP  losartan-hydrochlorothiazide (HYZAAR) 100-12.5 MG tablet Take 1 tablet by mouth daily. 02/16/19   Olive Bass, FNP  medroxyPROGESTERone (PROVERA) 5 MG tablet Take 1 tablet (5 mg total) by mouth daily. 04/27/19   Adam Phenix, MD  metFORMIN (GLUCOPHAGE) 500 MG tablet TAKE 2 TABLETS BY MOUTH TWICE DAILY WITH A MEAL 10/11/19   Olive Bass, FNP  methocarbamol (ROBAXIN) 500 MG tablet TAKE ONE TABLET BY MOUTH EVERY 8 HOURS AS NEEDED FOR MUSCLE SPASMS 09/30/19   Olive Bass, FNP  Misc. Devices (CANE) MISC Use as needed for left knee pain. 01/29/18   Evaristo Bury, NP  naproxen (NAPROSYN) 375 MG tablet Take 1 tablet (375 mg total) by mouth 2 (two) times daily. 06/24/19   Olive Bass, FNP  nystatin cream (MYCOSTATIN) Apply 1 application topically 2 (two) times daily. 04/27/19   Adam Phenix, MD  predniSONE (DELTASONE) 20 MG tablet Take 1 tablet (20 mg total) by mouth daily with breakfast. 08/30/19   Olive Bass, FNP  QUEtiapine (SEROQUEL) 50 MG tablet Take 50 mg by mouth at bedtime.    [provider]  traMADol (ULTRAM) 50 MG tablet Take 1 tablet (50 mg total) by mouth every 6 (six) hours as needed. 07/12/18   Evaristo Bury, NP  venlafaxine (EFFEXOR) 75 MG tablet Take 75 mg by mouth 3 (three) times daily with meals.    [provider]    Allergies    Patient has no known allergies.  Review of Systems   Review of Systems  All other systems reviewed and are negative.   Physical Exam Updated Vital Signs BP (!) 143/90   Pulse 85   Temp 98.8 F (37.1 C) (Oral)   Resp 19   Wt 101.6 kg   LMP 02/13/2014 (Within Days)   SpO2 97%   BMI 35.07 kg/m   Physical Exam Vitals and nursing note reviewed.  Constitutional:      General: She is not in acute distress.    Appearance: Normal appearance. She is well-developed. She is  not toxic-appearing.  HENT:     Head: Normocephalic and atraumatic.  Eyes:     General: Lids are normal.     Conjunctiva/sclera: Conjunctivae normal.     Pupils: Pupils are equal, round, and reactive to light.  Neck:     Thyroid: No thyroid mass.     Trachea: No tracheal deviation.  Cardiovascular:  Rate and Rhythm: Normal rate and regular rhythm.     Heart sounds: Normal heart sounds. No murmur. No gallop.   Pulmonary:     Effort: Pulmonary effort is normal. No respiratory distress.     Breath sounds: Normal breath sounds. No stridor. No decreased breath sounds, wheezing, rhonchi or rales.  Abdominal:     General: Bowel sounds are normal. There is no distension.     Palpations: Abdomen is soft.     Tenderness: There is abdominal tenderness in the epigastric area. There is no guarding or rebound.    Musculoskeletal:        General: No tenderness. Normal range of motion.     Cervical back: Normal range of motion and neck supple.  Skin:    General: Skin is warm and dry.     Findings: No abrasion or rash.  Neurological:     Mental Status: She is alert and oriented to person, place, and time.     GCS: GCS eye subscore is 4. GCS verbal subscore is 5. GCS motor subscore is 6.     Cranial Nerves: No cranial nerve deficit.     Sensory: No sensory deficit.  Psychiatric:        Speech: Speech normal.        Behavior: Behavior normal.     ED Results / Procedures / Treatments   Labs (all labs ordered are listed, but only abnormal results are displayed) Labs Reviewed  BASIC METABOLIC PANEL - Abnormal; Notable for the following components:      Result Value   Glucose, Bld 161 (*)    Calcium 8.7 (*)    All other components within normal limits  CBC - Abnormal; Notable for the following components:   Hemoglobin 10.9 (*)    HCT 34.6 (*)    All other components within normal limits  I-STAT BETA HCG BLOOD, ED ([facility], [facility], [facility] ONLY)  I-STAT BETA HCG BLOOD, ED (NOT ORDERABLE)  TROPONIN  I (HIGH SENSITIVITY)    EKG EKG Interpretation  Date/Time:  Thursday December 08 2019 15:39:46 EDT Ventricular Rate:  86 PR Interval:  126 QRS Duration: 64 QT Interval:  374 QTC Calculation: 447 R Axis:   0 Text Interpretation: Normal sinus rhythm Nonspecific T wave abnormality Abnormal ECG No significant change since last tracing Confirmed by Lorre Nick ([id number]) on 12/08/2019 6:07:07 PM   Radiology DG Chest 2 View  Result Date: 12/08/2019 CLINICAL DATA:  Chest pain. EXAM: CHEST - 2 VIEW COMPARISON:  Chest x-ray dated February 15, 2018. FINDINGS: The heart size and mediastinal contours are within normal limits. Both lungs are clear. The visualized skeletal structures are unremarkable. Two staple like densities overlying the right lower neck, not seen on the prior study. IMPRESSION: 1. No active cardiopulmonary disease. 2. Two staple like densities overlying the right lower neck, not seen on the prior study. Correlate with clinical history and physical exam. Electronically Signed   By: Obie Dredge M.D.   On: 12/08/2019 17:20    Procedures Procedures (including critical care time)  Medications Ordered in ED Medications  sodium chloride flush (NS) 0.9 % injection 3 mL (has no administration in time range)    ED Course  I have reviewed the triage vital signs and the nursing notes.  Pertinent labs & imaging results that were available during my care of the patient were reviewed by me and considered in my medical decision making (see chart for details).    MDM Rules/Calculators/A&P  Patient's labs and EKG are reassuring here.  Symptoms consistent with GERD.  Patient prescribed medications and return precautions given Final Clinical Impression(s) / ED Diagnoses Final diagnoses:  None    Rx / DC Orders ED Discharge Orders    None       Lorre Nick, MD 12/08/19 1813

## 2019-12-08 NOTE — ED Triage Notes (Signed)
Pt c/o general chest pains since MOnday that has been constant.

## 2019-12-26 ENCOUNTER — Other Ambulatory Visit: Payer: Self-pay

## 2019-12-26 ENCOUNTER — Ambulatory Visit (INDEPENDENT_AMBULATORY_CARE_PROVIDER_SITE_OTHER)

## 2019-12-26 DIAGNOSIS — E538 Deficiency of other specified B group vitamins: Secondary | ICD-10-CM

## 2019-12-26 MED ORDER — CYANOCOBALAMIN 1000 MCG/ML IJ SOLN
1000.0000 ug | Freq: Once | INTRAMUSCULAR | Status: AC
  Administered 2019-12-26: 1000 ug via INTRAMUSCULAR

## 2019-12-26 NOTE — Progress Notes (Signed)
Vit B12 given w/o complications. Pt tolerated well.

## 2020-01-02 ENCOUNTER — Ambulatory Visit: Admitting: Family

## 2020-01-16 ENCOUNTER — Other Ambulatory Visit: Payer: Self-pay | Admitting: Family

## 2020-01-16 DIAGNOSIS — G8929 Other chronic pain: Secondary | ICD-10-CM

## 2020-01-16 DIAGNOSIS — M545 Low back pain, unspecified: Secondary | ICD-10-CM

## 2020-01-16 MED ORDER — GLIMEPIRIDE 2 MG PO TABS: 2.0000 mg | ORAL_TABLET | Freq: Every day | ORAL | 0 refills | Status: DC

## 2020-01-16 NOTE — Telephone Encounter (Signed)
1.Medication Requested:glimepiride (AMARYL) 2 MG tablet -- 90 day supply   2. Pharmacy (Name, Street, [place]):[facility] - [place], [place] - [address]  3. On Med List: Yes   4. Last Visit with PCP: 2.23.21  5. Next visit date with PCP: n/a   Agent: Please be advised that RX refills may take up to 3 business days. We ask that you follow-up with your pharmacy.

## 2020-01-16 NOTE — Telephone Encounter (Signed)
Sent 30 day of glimepiride to [facility] pt is due for f/u appt. Will hold for approval on Methocarbamol whn PCP returns tomorrow.Marland KitchenRaechel Chute

## 2020-01-18 ENCOUNTER — Other Ambulatory Visit: Payer: Self-pay

## 2020-01-18 ENCOUNTER — Telehealth (INDEPENDENT_AMBULATORY_CARE_PROVIDER_SITE_OTHER): Admitting: Psychiatry

## 2020-01-18 ENCOUNTER — Encounter (HOSPITAL_COMMUNITY): Payer: Self-pay | Admitting: Psychiatry

## 2020-01-18 DIAGNOSIS — F411 Generalized anxiety disorder: Secondary | ICD-10-CM | POA: Diagnosis not present

## 2020-01-18 DIAGNOSIS — F330 Major depressive disorder, recurrent, mild: Secondary | ICD-10-CM | POA: Diagnosis not present

## 2020-01-18 MED ORDER — VENLAFAXINE HCL 75 MG PO TABS: 75.0000 mg | ORAL_TABLET | Freq: Three times a day (TID) | ORAL | 2 refills | Status: DC

## 2020-01-18 MED ORDER — QUETIAPINE FUMARATE 300 MG PO TABS: 300.0000 mg | ORAL_TABLET | Freq: Every evening | ORAL | 2 refills | Status: DC | PRN

## 2020-01-18 MED ORDER — HYDROXYZINE HCL 25 MG PO TABS: 25.0000 mg | ORAL_TABLET | Freq: Three times a day (TID) | ORAL | 2 refills | Status: DC | PRN

## 2020-01-18 NOTE — Progress Notes (Signed)
Psychiatric Initial Adult Assessment  Virtual Visit via Video Note  I connected with [name] on 01/18/20 at  3:00 PM EDT by a video enabled telemedicine application and verified that I am speaking with the correct person using two identifiers.  Location: Patient: Home Provider: Clinic   I discussed the limitations of evaluation and management by telemedicine and the availability of in person appointments. The patient expressed understanding and agreed to proceed.  I provided 45 minutes of non-face-to-face time during this encounter.    Patient Identification: Stephanie Joseph MRN:  [id number] Date of Evaluation:  01/18/2020 Referral Source: Vesta Mixer Chief Complaint:  "I'm doing well" Visit Diagnosis:    ICD-10-CM   1. Mild episode of recurrent major depressive disorder (HCC)  F33.0 venlafaxine (EFFEXOR) 75 MG tablet    QUEtiapine (SEROQUEL) 300 MG tablet  2. Generalized anxiety disorder  F41.1 hydrOXYzine (ATARAX/VISTARIL) 25 MG tablet    History of Present Illness: 59 year old female seen today for initial psychiatric evaluation.  She was referred to outpatient psychiatry by [facility] for medication management.  She has a psychiatric history of depression, anxiety, cannabis use disorder, and cocaine use disorder.  She informed Clinical research associate that she has been sober from substances for over 25 years noting that her church was influential in her sobriety.   Today she notes that her medications are effective in managing her psychiatric history.  She denies symptoms of anxiety, depression, psychosis, or mania.  She denies SI/HI/AVH and paranoia.  She is agreeable to continuing all medications as prescribed and will follow up with provider in 2 months.  No other concerns noted at this time.  Associated Signs/Symptoms: Depression Symptoms:  Denies (Hypo) Manic Symptoms:  Denies Anxiety Symptoms:  Denies Psychotic Symptoms:  Denies PTSD Symptoms: NA  Past Psychiatric  History:  depression, anxiety, cannabis use disorder, and cocaine use disorder.  Previous Psychotropic Medications: Tried prozac  Substance Abuse History in the last 12 months:  No.  Consequences of Substance Abuse: NA  Past Medical History:  Past Medical History:  Diagnosis Date  . Anemia, iron deficiency   . Arthritis   . Depression   . Diabetes mellitus without complication (HCC)   . Dysmenorrhea   . Gout   . Heart valve problem   . Hypertension   . Menorrhagia   . Mitral regurgitation   . Pneumonia     Past Surgical History:  Procedure Laterality Date  . KNEE ARTHROSCOPY     Left    Family Psychiatric History: Brother schizophrenia (deceased)  Family History:  Family History  Problem Relation Age of Onset  . Hypertension Mother   . Stroke Paternal Grandmother   . Heart disease Sister   . Heart disease Brother     Social History:   Social History   Socioeconomic History  . Marital status: Married    Spouse name: Not on file  . Number of children: 1  . Years of education: 109  . Highest education level: Not on file  Occupational History  . Occupation: Disability Pending  Tobacco Use  . Smoking status: Current Every Day Smoker    Packs/day: 0.50    Years: 7.00    Pack years: 3.50    Types: Cigarettes  . Smokeless tobacco: Never Used  Vaping Use  . Vaping Use: Never used  Substance and Sexual Activity  . Alcohol use: No  . Drug use: No    Types: "Crack" cocaine  . Sexual activity: Yes  Birth control/protection: None  Other Topics Concern  . Not on file  Social History Narrative   Fun: Go to church   Denies abuse and feels safe at home.    Social Determinants of Health   Financial Resource Strain:   . Difficulty of Paying Living Expenses:   Food Insecurity:   . Worried About Programme researcher, broadcasting/film/videounning Out of Food in the Last Year:   . Baristaan Out of Food in the Last Year:   Transportation Needs:   . Freight forwarderLack of Transportation (Medical):   Marland Kitchen. Lack of Transportation  (Non-Medical):   Physical Activity:   . Days of Exercise per Week:   . Minutes of Exercise per Session:   Stress:   . Feeling of Stress :   Social Connections:   . Frequency of Communication with Friends and Family:   . Frequency of Social Gatherings with Friends and Family:   . Attends Religious Services:   . Active Member of Clubs or Organizations:   . Attends BankerClub or Organization Meetings:   Marland Kitchen. Marital Status:     Additional Social History: Patient current lives in [place] with her husband. She has one daughter who is [id number]. She is currently unemployed. She endorsees smoking 5 cigarettes a day. She notes that she has been sober from cocaine and mariajuana for 25 and denies illicit drug use or alcohol use.  Allergies:  No Known Allergies  Metabolic Disorder Labs: Lab Results  Component Value Date   HGBA1C 7.7 (H) 08/30/2019   No results found for: PROLACTIN Lab Results  Component Value Date   CHOL 93 11/19/2018   TRIG 71.0 11/19/2018   HDL 50.60 11/19/2018   CHOLHDL 2 11/19/2018   VLDL 14.2 11/19/2018   LDLCALC 28 11/19/2018   LDLCALC 24 07/12/2018   Lab Results  Component Value Date   TSH 0.59 05/10/2019    Therapeutic Level Labs: No results found for: LITHIUM No results found for: CBMZ No results found for: VALPROATE  Current Medications: Current Outpatient Medications  Medication Sig Dispense Refill  . amLODipine (NORVASC) 10 MG tablet Take 1 tablet (10 mg total) by mouth daily. 90 tablet 3  . atorvastatin (LIPITOR) 10 MG tablet Take 1 tablet (10 mg total) by mouth daily. 90 tablet 3  . betamethasone dipropionate (DIPROLENE) 0.05 % ointment Apply topically 2 (two) times daily. 30 g 0  . brimonidine-timolol (COMBIGAN) 0.2-0.5 % ophthalmic solution Place 1 drop into both eyes every 12 (twelve) hours.    . diclofenac sodium (VOLTAREN) 1 % GEL Apply 2 g topically 4 (four) times daily. (Patient not taking: Reported on 12/08/2019) 100 g 3  . estradiol (ESTRACE) 1 MG  tablet Take 1 tablet (1 mg total) by mouth daily. 90 tablet 3  . glimepiride (AMARYL) 2 MG tablet Take 1 tablet (2 mg total) by mouth daily with breakfast. Overdue for appt must see provider for future refills 60 tablet 0  . glucose blood (ACCU-CHEK AVIVA) test strip Use to check blood sugars 1-3 times daily. Dx Code: E11.9 300 each 1  . hydrOXYzine (ATARAX/VISTARIL) 25 MG tablet Take 1 tablet (25 mg total) by mouth every 8 (eight) hours as needed for itching. 120 tablet 2  . Lancets (ACCU-CHEK SOFT TOUCH) lancets Use as instructed 100 each 12  . losartan-hydrochlorothiazide (HYZAAR) 100-12.5 MG tablet Take 1 tablet by mouth daily. 90 tablet 3  . medroxyPROGESTERone (PROVERA) 5 MG tablet Take 1 tablet (5 mg total) by mouth daily. (Patient not taking: Reported on 12/08/2019) 90  tablet 3  . metFORMIN (GLUCOPHAGE) 500 MG tablet TAKE 2 TABLETS BY MOUTH TWICE DAILY WITH A MEAL (Patient taking differently: Take 1,000 mg by mouth 2 (two) times daily with a meal. ) 120 tablet 0  . methocarbamol (ROBAXIN) 500 MG tablet TAKE ONE TABLET BY MOUTH EVERY 8 HOURS AS NEEDED FOR MUSCLE SPASMS (Patient taking differently: Take 500 mg by mouth every 8 (eight) hours as needed for muscle spasms. ) 90 tablet 0  . Misc. Devices (CANE) MISC Use as needed for left knee pain. 1 each 0  . naproxen (NAPROSYN) 375 MG tablet Take 1 tablet (375 mg total) by mouth 2 (two) times daily. (Patient taking differently: Take 375 mg by mouth 2 (two) times daily as needed for moderate pain. ) 60 tablet 0  . nystatin cream (MYCOSTATIN) Apply 1 application topically 2 (two) times daily. (Patient not taking: Reported on 12/08/2019) 30 g 0  . pantoprazole (PROTONIX) 20 MG tablet Take 1 tablet (20 mg total) by mouth 2 (two) times daily. 60 tablet 0  . predniSONE (DELTASONE) 20 MG tablet Take 1 tablet (20 mg total) by mouth daily with breakfast. (Patient not taking: Reported on 12/08/2019) 5 tablet 0  . QUEtiapine (SEROQUEL) 300 MG tablet Take 1 tablet  (300 mg total) by mouth at bedtime as needed (sleep). 30 tablet 2  . sucralfate (CARAFATE) 1 g tablet Take 1 tablet (1 g total) by mouth 4 (four) times daily. 30 tablet 0  . traMADol (ULTRAM) 50 MG tablet Take 1 tablet (50 mg total) by mouth every 6 (six) hours as needed. (Patient not taking: Reported on 12/08/2019) 30 tablet 0  . venlafaxine (EFFEXOR) 75 MG tablet Take 1 tablet (75 mg total) by mouth 3 (three) times daily with meals. 90 tablet 2   Current Facility-Administered Medications  Medication Dose Route Frequency Provider Last Rate Last Admin  . cyanocobalamin ((VITAMIN B-12)) injection 1,000 mcg  1,000 mcg Intramuscular Once Olive Bass, FNP        Musculoskeletal: Strength & Muscle Tone: Unable to assess due to telehealth visit Gait & Station: Unable to assess due to telehealth visit. Patient leans: N/A  Psychiatric Specialty Exam: Review of Systems  Last menstrual period 02/13/2014.There is no height or weight on file to calculate BMI.  General Appearance: Well Groomed  Eye Contact:  Good  Speech:  Clear and Coherent and Normal Rate  Volume:  Normal  Mood:  Euthymic  Affect:  Congruent  Thought Process:  Coherent, Goal Directed and Linear  Orientation:  Full (Time, Place, and Person)  Thought Content:  WDL and Logical  Suicidal Thoughts:  No  Homicidal Thoughts:  No  Memory:  Immediate;   Good Recent;   Good Remote;   Good  Judgement:  Good  Insight:  Good  Psychomotor Activity:  Normal  Concentration:  Concentration: Good and Attention Span: Good  Recall:  Good  Fund of Knowledge:Good  Language: Good  Akathisia:  No  Handed:  Right  AIMS (if indicated):  Not done  Assets:  Communication Skills Desire for Improvement Housing Social Support  ADL's:  Intact  Cognition: WNL  Sleep:  Good   Screenings: GAD-7     [facility] Visit from 04/27/2019 in [facility]  Total GAD-7 Score 10    PHQ2-9     Office Visit from 03/05/2018  in [facility]  PHQ-2 Total Score 2      Assessment and Plan: Patient reports  that she is doing well on current medication regimen.  She is agreeable to continue all medications as prescribed.  1. Mild episode of recurrent major depressive disorder (HCC)  Continue- venlafaxine (EFFEXOR) 75 MG tablet; Take 1 tablet (75 mg total) by mouth 3 (three) times daily with meals.  Dispense: 90 tablet; Refill: 2 Continue- QUEtiapine (SEROQUEL) 300 MG tablet; Take 1 tablet (300 mg total) by mouth at bedtime as needed (sleep).  Dispense: 30 tablet; Refill: 2  2. Generalized anxiety disorder  Continue- hydrOXYzine (ATARAX/VISTARIL) 25 MG tablet; Take 1 tablet (25 mg total) by mouth every 8 (eight) hours as needed for itching.  Dispense: 120 tablet; Refill: 2  Follow-up in 2 month  Shanna Cisco, NP 7/14/20213:19 PM

## 2020-01-25 ENCOUNTER — Ambulatory Visit (INDEPENDENT_AMBULATORY_CARE_PROVIDER_SITE_OTHER): Admitting: *Deleted

## 2020-01-25 ENCOUNTER — Other Ambulatory Visit: Payer: Self-pay

## 2020-01-25 DIAGNOSIS — E538 Deficiency of other specified B group vitamins: Secondary | ICD-10-CM

## 2020-01-25 MED ORDER — CYANOCOBALAMIN 1000 MCG/ML IJ SOLN
1000.0000 ug | Freq: Once | INTRAMUSCULAR | Status: AC
  Administered 2020-01-25: 1000 ug via INTRAMUSCULAR

## 2020-01-25 NOTE — Progress Notes (Signed)
Pls cosign for B12 inj../lmb  

## 2020-01-27 ENCOUNTER — Other Ambulatory Visit: Payer: Self-pay

## 2020-01-27 ENCOUNTER — Ambulatory Visit (INDEPENDENT_AMBULATORY_CARE_PROVIDER_SITE_OTHER): Admitting: Family

## 2020-01-27 ENCOUNTER — Encounter: Payer: Self-pay | Admitting: Family

## 2020-01-27 VITALS — BP 122/70 | HR 88 | Temp 98.7°F | Ht 67.0 in | Wt 219.0 lb

## 2020-01-27 DIAGNOSIS — G4730 Sleep apnea, unspecified: Secondary | ICD-10-CM

## 2020-01-27 DIAGNOSIS — M545 Low back pain, unspecified: Secondary | ICD-10-CM

## 2020-01-27 DIAGNOSIS — E119 Type 2 diabetes mellitus without complications: Secondary | ICD-10-CM | POA: Diagnosis not present

## 2020-01-27 DIAGNOSIS — E1165 Type 2 diabetes mellitus with hyperglycemia: Secondary | ICD-10-CM

## 2020-01-27 DIAGNOSIS — I10 Essential (primary) hypertension: Secondary | ICD-10-CM

## 2020-01-27 DIAGNOSIS — G8929 Other chronic pain: Secondary | ICD-10-CM

## 2020-01-27 MED ORDER — AMLODIPINE BESYLATE 10 MG PO TABS: 10.0000 mg | ORAL_TABLET | Freq: Every day | ORAL | 3 refills | Status: DC

## 2020-01-27 MED ORDER — LOSARTAN POTASSIUM-HCTZ 100-12.5 MG PO TABS: 1.0000 | ORAL_TABLET | Freq: Every day | ORAL | 3 refills | Status: DC

## 2020-01-27 MED ORDER — ATORVASTATIN CALCIUM 10 MG PO TABS: 10.0000 mg | ORAL_TABLET | Freq: Every day | ORAL | 3 refills | Status: DC

## 2020-01-27 MED ORDER — GLIMEPIRIDE 2 MG PO TABS: 2.0000 mg | ORAL_TABLET | Freq: Every day | ORAL | 1 refills | Status: DC

## 2020-01-27 MED ORDER — METHOCARBAMOL 500 MG PO TABS: ORAL_TABLET | ORAL | 3 refills | Status: DC

## 2020-01-27 MED ORDER — METFORMIN HCL 500 MG PO TABS: 500.0000 mg | ORAL_TABLET | Freq: Two times a day (BID) | ORAL | 1 refills | Status: DC

## 2020-01-27 NOTE — Progress Notes (Signed)
Stephanie Joseph is a 59 y.o. female with the following history as recorded in Epic[employer]:  Patient Active Problem List   Diagnosis Date Noted  . Chronic midline low back pain 07/12/2018  . Acute pharyngitis 09/16/2017  . Abnormal TSH 09/16/2017  . Glaucoma 05/21/2017  . Tooth pain 04/28/2017  . Encounter for general adult medical examination with abnormal findings 09/19/2016  . Class 1 obesity due to excess calories with serious comorbidity and body mass index (BMI) of 33.0 to 33.9 in adult 09/19/2016  . Vitamin D deficiency 09/19/2016  . Urinary frequency 02/14/2016  . Back pain 10/18/2015  . Type 2 diabetes mellitus with hyperglycemia, without long-term current use of insulin ([place]) 07/10/2015  . Hormone replacement therapy (postmenopausal) 10/04/2014  . Mitral regurgitation 11/05/2012  . Aortic valve disease 10/21/2011  . Menorrhagia, premenopausal 09/01/2011  . Depression 03/04/2010  . GOUT 12/16/2006  . ANEMIA, IRON DEFICIENCY NOS 12/16/2006  . TOBACCO ABUSE 12/16/2006  . Essential hypertension 12/16/2006    Current Outpatient Medications  Medication Sig Dispense Refill  . amLODipine (NORVASC) 10 MG tablet Take 1 tablet (10 mg total) by mouth daily. 90 tablet 3  . atorvastatin (LIPITOR) 10 MG tablet Take 1 tablet (10 mg total) by mouth daily. 90 tablet 3  . brimonidine-timolol (COMBIGAN) 0.2-0.5 % ophthalmic solution Place 1 drop into both eyes every 12 (twelve) hours.    Marland Kitchen estradiol (ESTRACE) 1 MG tablet Take 1 tablet (1 mg total) by mouth daily. 90 tablet 3  . glimepiride (AMARYL) 2 MG tablet Take 1 tablet (2 mg total) by mouth daily with breakfast. Overdue for appt must see provider for future refills 90 tablet 1  . glucose blood (ACCU-CHEK AVIVA) test strip Use to check blood sugars 1-3 times daily. Dx Code: E11.9 300 each 1  . hydrOXYzine (ATARAX/VISTARIL) 25 MG tablet Take 1 tablet (25 mg total) by mouth every 8 (eight) hours as needed for itching. 120 tablet 2  .  Lancets (ACCU-CHEK SOFT TOUCH) lancets Use as instructed 100 each 12  . losartan-hydrochlorothiazide (HYZAAR) 100-12.5 MG tablet Take 1 tablet by mouth daily. 90 tablet 3  . metFORMIN (GLUCOPHAGE) 500 MG tablet Take 1 tablet (500 mg total) by mouth 2 (two) times daily with a meal. 180 tablet 1  . methocarbamol (ROBAXIN) 500 MG tablet TAKE ONE TABLET BY MOUTH EVERY 8 HOURS AS NEEDED FOR MUSCLE SPASMS 90 tablet 3  . Misc. Devices (CANE) MISC Use as needed for left knee pain. 1 each 0  . naproxen (NAPROSYN) 375 MG tablet Take 1 tablet (375 mg total) by mouth 2 (two) times daily. (Patient taking differently: Take 375 mg by mouth 2 (two) times daily as needed for moderate pain. ) 60 tablet 0  . QUEtiapine (SEROQUEL) 300 MG tablet Take 1 tablet (300 mg total) by mouth at bedtime as needed (sleep). 30 tablet 2  . sucralfate (CARAFATE) 1 g tablet Take 1 tablet (1 g total) by mouth 4 (four) times daily. 30 tablet 0  . venlafaxine (EFFEXOR) 75 MG tablet Take 1 tablet (75 mg total) by mouth 3 (three) times daily with meals. 90 tablet 2  . betamethasone dipropionate (DIPROLENE) 0.05 % ointment Apply topically 2 (two) times daily. (Patient not taking: Reported on 01/27/2020) 30 g 0  . medroxyPROGESTERone (PROVERA) 5 MG tablet Take 1 tablet (5 mg total) by mouth daily. (Patient not taking: Reported on 12/08/2019) 90 tablet 3   Current Facility-Administered Medications  Medication Dose Route Frequency Provider Last Rate Last  Admin  . cyanocobalamin ((VITAMIN B-12)) injection 1,000 mcg  1,000 mcg Intramuscular Once Marrian Salvage, FNP        Allergies: Patient has no known allergies.  Past Medical History:  Diagnosis Date  . Anemia, iron deficiency   . Arthritis   . Depression   . Diabetes mellitus without complication ([place])   . Dysmenorrhea   . Gout   . Heart valve problem   . Hypertension   . Menorrhagia   . Mitral regurgitation   . Pneumonia     Past Surgical History:  Procedure Laterality  Date  . KNEE ARTHROSCOPY     Left    Family History  Problem Relation Age of Onset  . Hypertension Mother   . Stroke Paternal Grandmother   . Heart disease Sister   . Heart disease Brother     Social History   Tobacco Use  . Smoking status: Current Every Day Smoker    Packs/day: 0.50    Years: 7.00    Pack years: 3.50    Types: Cigarettes  . Smokeless tobacco: Never Used  Substance Use Topics  . Alcohol use: No    Subjective:  5 month follow-up on Type 2 Diabetes/ hypertension/ tobacco abuse; Does not want to quit smoking- knows that she needs too; Denies any chest pain, shortness of breath, blurred vision or headache Requesting medication refills as well;    Objective:  Vitals:   01/27/20 0946  BP: 122/70  Pulse: 88  Temp: 98.7 F (37.1 C)  TempSrc: Oral  SpO2: 99%  Weight: (!) 219 lb (99.3 kg)  Height: _0  (1.702 m)    General: Well developed, well nourished, in no acute distress  Skin : Warm and dry.  Head: Normocephalic and atraumatic  Eyes: Sclera and conjunctiva clear; pupils round and reactive to light; extraocular movements intact  Ears: External normal; canals clear; tympanic membranes normal  Oropharynx: Pink, supple. No suspicious lesions  Neck: Supple without thyromegaly, adenopathy  Lungs: Respirations unlabored; clear to auscultation bilaterally without wheeze, rales, rhonchi  CVS exam: normal rate, regular rhythm, normal S1, S2, no murmurs, rubs, clicks or gallops, normal rate and regular rhythm.  Musculoskeletal: No deformities; no active joint inflammation  Extremities: No edema, cyanosis, clubbing  Vessels: Symmetric bilaterally  Neurologic: Alert and oriented; speech intact; face symmetrical; moves all extremities well; CNII-XII intact without focal deficit  Assessment:  1. Sleep apnea, unspecified type   2. Chronic midline low back pain, unspecified whether sciatica present   3. Essential hypertension   4. Type 2 diabetes mellitus  without complication, without long-term current use of insulin ([place])   5. Type 2 diabetes mellitus with hyperglycemia, without long-term current use of insulin ([facility]) Chronic    Plan:  1. Sleep study order updated; 2. Refill on Robaxin as requested; 3. Check CMP, Hgba1c today; stressed need to quit smoking; 4. Stable; refill updated;   Patient also understands she needs to contact her GYN about her progesterone prescription; she has only been taking Estrace; explained that with intact uterus she need progesterone as well;   This visit occurred during the SARS-CoV-2 public health emergency.  Safety protocols were in place, including screening questions prior to the visit, additional usage of staff PPE, and extensive cleaning of exam room while observing appropriate contact time as indicated for disinfecting solutions.     No follow-ups on file.  Orders Placed This Encounter  Procedures  . CBC with Differential/Platelet    Standing Status:  Future    Number of Occurrences:   1    Standing Expiration Date:   01/26/2021  . Comp Met (CMET)    Standing Status:   Future    Number of Occurrences:   1    Standing Expiration Date:   01/26/2021  . Lipid panel    Standing Status:   Future    Number of Occurrences:   1    Standing Expiration Date:   01/26/2021  . Hemoglobin A1c    Standing Status:   Future    Number of Occurrences:   1    Standing Expiration Date:   01/26/2021  . Ambulatory referral to Neurology    Referral Priority:   Routine    Referral Type:   Consultation    Referral Reason:   Specialty Services Required    Requested Specialty:   Neurology    Number of Visits Requested:   1    Requested Prescriptions   Signed Prescriptions Disp Refills  . amLODipine (NORVASC) 10 MG tablet 90 tablet 3    Sig: Take 1 tablet (10 mg total) by mouth daily.  Marland Kitchen atorvastatin (LIPITOR) 10 MG tablet 90 tablet 3    Sig: Take 1 tablet (10 mg total) by mouth daily.  Marland Kitchen glimepiride (AMARYL) 2 MG  tablet 90 tablet 1    Sig: Take 1 tablet (2 mg total) by mouth daily with breakfast. Overdue for appt must see provider for future refills  . losartan-hydrochlorothiazide (HYZAAR) 100-12.5 MG tablet 90 tablet 3    Sig: Take 1 tablet by mouth daily.  . metFORMIN (GLUCOPHAGE) 500 MG tablet 180 tablet 1    Sig: Take 1 tablet (500 mg total) by mouth 2 (two) times daily with a meal.  . methocarbamol (ROBAXIN) 500 MG tablet 90 tablet 3    Sig: TAKE ONE TABLET BY MOUTH EVERY 8 HOURS AS NEEDED FOR MUSCLE SPASMS

## 2020-01-28 LAB — CBC WITH DIFFERENTIAL/PLATELET
Absolute Monocytes: 503 cells/uL (ref 200–950)
Basophils Absolute: 30 cells/uL (ref 0–200)
Basophils Relative: 0.4 %
Eosinophils Absolute: 128 cells/uL (ref 15–500)
Eosinophils Relative: 1.7 %
HCT: 35.5 % (ref 35.0–45.0)
Hemoglobin: 11.8 g/dL (ref 11.7–15.5)
Lymphs Abs: 2715 cells/uL (ref 850–3900)
MCH: 27.7 pg (ref 27.0–33.0)
MCHC: 33.2 g/dL (ref 32.0–36.0)
MCV: 83.3 fL (ref 80.0–100.0)
MPV: 9.3 fL (ref 7.5–12.5)
Monocytes Relative: 6.7 %
Neutro Abs: 4125 cells/uL (ref 1500–7800)
Neutrophils Relative %: 55 %
Platelets: 271 10*3/uL (ref 140–400)
RBC: 4.26 10*6/uL (ref 3.80–5.10)
RDW: 13.3 % (ref 11.0–15.0)
Total Lymphocyte: 36.2 %
WBC: 7.5 10*3/uL (ref 3.8–10.8)

## 2020-01-28 LAB — LIPID PANEL
Cholesterol: 114 mg/dL (ref <200)
HDL: 69 mg/dL (ref >=50)
LDL Cholesterol (Calc): 31 mg/dL (calc)
Non-HDL Cholesterol (Calc): 45 mg/dL (calc) (ref <130)
Total CHOL/HDL Ratio: 1.7 (calc) (ref <5.0)
Triglycerides: 60 mg/dL (ref <150)

## 2020-01-28 LAB — COMPREHENSIVE METABOLIC PANEL
AG Ratio: 1.3 (calc) (ref 1.0–2.5)
ALT: 11 U/L (ref 6–29)
AST: 11 U/L (ref 10–35)
Albumin: 3.7 g/dL (ref 3.6–5.1)
Alkaline phosphatase (APISO): 79 U/L (ref 37–153)
BUN: 12 mg/dL (ref 7–25)
CO2: 27 mmol/L (ref 20–32)
Calcium: 8.8 mg/dL (ref 8.6–10.4)
Chloride: 103 mmol/L (ref 98–110)
Creat: 0.91 mg/dL (ref 0.50–1.05)
Globulin: 2.8 g/dL (calc) (ref 1.9–3.7)
Glucose, Bld: 143 mg/dL — ABNORMAL HIGH (ref 65–99)
Potassium: 3.9 mmol/L (ref 3.5–5.3)
Sodium: 138 mmol/L (ref 135–146)
Total Bilirubin: 0.4 mg/dL (ref 0.2–1.2)
Total Protein: 6.5 g/dL (ref 6.1–8.1)

## 2020-01-28 LAB — HEMOGLOBIN A1C
Hgb A1c MFr Bld: 8.5 % of total Hgb — ABNORMAL HIGH (ref <5.7)
Mean Plasma Glucose: 197 (calc)
eAG (mmol/L): 10.9 (calc)

## 2020-01-31 ENCOUNTER — Telehealth: Payer: Self-pay | Admitting: *Deleted

## 2020-01-31 NOTE — Telephone Encounter (Signed)
Pt called to office with questions about her medications.  Pt states that she stopped taking her Provera about a month ago- has not had any bleeding/symptoms since. Pt states that her PCP recommended that she not take estrogen as prescribed if she is not taking progesterone. Pt would like clarification from our provider for medication use. Pt states she is not currently having any symptoms- no hot flashes, bleeding.    Pt made aware message to be sent to provider today for further recommendations.     Please advise on medication use.

## 2020-02-06 ENCOUNTER — Telehealth: Payer: Self-pay

## 2020-02-06 NOTE — Telephone Encounter (Signed)
Error

## 2020-03-06 ENCOUNTER — Other Ambulatory Visit: Payer: Self-pay

## 2020-03-06 ENCOUNTER — Encounter: Payer: Self-pay | Admitting: Neurology

## 2020-03-06 ENCOUNTER — Ambulatory Visit (INDEPENDENT_AMBULATORY_CARE_PROVIDER_SITE_OTHER): Admitting: Neurology

## 2020-03-06 VITALS — BP 122/60 | HR 82 | Ht 67.0 in | Wt 217.0 lb

## 2020-03-06 DIAGNOSIS — R519 Headache, unspecified: Secondary | ICD-10-CM

## 2020-03-06 DIAGNOSIS — R351 Nocturia: Secondary | ICD-10-CM

## 2020-03-06 DIAGNOSIS — E669 Obesity, unspecified: Secondary | ICD-10-CM | POA: Diagnosis not present

## 2020-03-06 DIAGNOSIS — G4733 Obstructive sleep apnea (adult) (pediatric): Secondary | ICD-10-CM

## 2020-03-06 DIAGNOSIS — F39 Unspecified mood [affective] disorder: Secondary | ICD-10-CM

## 2020-03-06 DIAGNOSIS — G4719 Other hypersomnia: Secondary | ICD-10-CM

## 2020-03-06 NOTE — Patient Instructions (Signed)

## 2020-03-06 NOTE — Progress Notes (Signed)
Subjective:    Patient ID: Stephanie Joseph is a 59 y.o. female.  HPI     Huston Foley, MD, PhD [facility] [address], [address] [address] [place], [place] [address]  Dear Vernona Rieger,   I saw your patient, Stephanie Joseph, upon your kind request, in my Sleep clinic today for initial consultation of her sleep disorder, in particular, evaluation of her prior diagnosis of sleep apnea.  The patient is unaccompanied today.  As you know, Stephanie Joseph, Stephanie Joseph is a 41 year old right-handed woman with an underlying medical history of hypertension, gout, diabetes, anemia, arthritis, depression, mitral regurgitation and obesity, who was previously diagnosed with obstructive sleep apnea. She has not been on CPAP therapy; it was recommended, but she could not start treatment due to cost as I understand.  I have reviewed your office note from 01/27/2020.  Sleep study testing was several years ago.  Her Epworth sleepiness score is 11 out of 24. Prior sleep study results are not available for my review today.  She reports snoring and witnessed apneas, excessive daytime somnolence, nocturia is about 3 times per average night and lately, in the recent past, she has had occasional nocturnal headaches, by the time she gets out of bed in the morning the headaches are typically resolved.  She has been followed by [facility] for her depression and anxiety and is going to establish with a new provider soon.  She has been on Seroquel 300 mg strength, takes half a pill at night as needed.  She also takes Effexor.  She has no family history of sleep apnea that she is aware of.  She lives with her husband her and her 16 year old daughter, they have no pets at the house.  She does have a TV in the bedroom and has it on at night but tries to turn it off or puts it on a timer at night.  She is generally in bed around 8 PM and rise time is around 7:30 AM.  She does not work.  Weight has been fairly  stable.  She drinks caffeine in the form of coffee, typically 2 cups in the morning and occasional soda, typically no tea.  She does not drink any alcohol and is working on smoking cessation, currently smokes about 4 to 5 cigarettes/day.  Her Past Medical History Is Significant For: Past Medical History:  Diagnosis Date  . Anemia, iron deficiency   . Arthritis   . Depression   . Diabetes mellitus without complication ([facility])   . Dysmenorrhea   . Gout   . Heart valve problem   . Hypertension   . Menorrhagia   . Mitral regurgitation   . Pneumonia     Her Past Surgical History Is Significant For: Past Surgical History:  Procedure Laterality Date  . KNEE ARTHROSCOPY     Left    Her Family History Is Significant For: Family History  Problem Relation Age of Onset  . Hypertension Mother   . Stroke Paternal Grandmother   . Heart disease Sister   . Heart disease Brother     Her Social History Is Significant For: Social History   Socioeconomic History  . Marital status: Married    Spouse name: Not on file  . Number of children: 1  . Years of education: 75  . Highest education level: Not on file  Occupational History  . Occupation: Disability Pending  Tobacco Use  . Smoking status: Current Every Day Smoker  Packs/day: 0.50    Years: 7.00    Pack years: 3.50    Types: Cigarettes  . Smokeless tobacco: Never Used  Vaping Use  . Vaping Use: Never used  Substance and Sexual Activity  . Alcohol use: No  . Drug use: No    Types: "Crack" cocaine  . Sexual activity: Yes    Birth control/protection: None  Other Topics Concern  . Not on file  Social History Narrative   Fun: Go to church   Denies abuse and feels safe at home.    Social Determinants of Health   Financial Resource Strain:   . Difficulty of Paying Living Expenses: Not on file  Food Insecurity:   . Worried About Programme researcher, broadcasting/film/videounning Out of Food in the Last Year: Not on file  . Ran Out of Food in the Last Year: Not on  file  Transportation Needs:   . Lack of Transportation (Medical): Not on file  . Lack of Transportation (Non-Medical): Not on file  Physical Activity:   . Days of Exercise per Week: Not on file  . Minutes of Exercise per Session: Not on file  Stress:   . Feeling of Stress : Not on file  Social Connections:   . Frequency of Communication with Friends and Family: Not on file  . Frequency of Social Gatherings with Friends and Family: Not on file  . Attends Religious Services: Not on file  . Active Member of Clubs or Organizations: Not on file  . Attends BankerClub or Organization Meetings: Not on file  . Marital Status: Not on file    Her Allergies Are:  No Known Allergies:   Her Current Medications Are:  Outpatient Encounter Medications as of 03/06/2020  Medication Sig  . amLODipine (NORVASC) 10 MG tablet Take 1 tablet (10 mg total) by mouth daily.  Marland Kitchen. atorvastatin (LIPITOR) 10 MG tablet Take 1 tablet (10 mg total) by mouth daily.  . betamethasone dipropionate (DIPROLENE) 0.05 % ointment Apply topically 2 (two) times daily.  . brimonidine-timolol (COMBIGAN) 0.2-0.5 % ophthalmic solution Place 1 drop into both eyes every 12 (twelve) hours.  Marland Kitchen. glimepiride (AMARYL) 2 MG tablet Take 1 tablet (2 mg total) by mouth daily with breakfast. Overdue for appt must see provider for future refills  . glucose blood (ACCU-CHEK AVIVA) test strip Use to check blood sugars 1-3 times daily. Dx Code: E11.9  . hydrOXYzine (ATARAX/VISTARIL) 25 MG tablet Take 1 tablet (25 mg total) by mouth every 8 (eight) hours as needed for itching.  . Lancets (ACCU-CHEK SOFT TOUCH) lancets Use as instructed  . losartan-hydrochlorothiazide (HYZAAR) 100-12.5 MG tablet Take 1 tablet by mouth daily.  . metFORMIN (GLUCOPHAGE) 500 MG tablet Take 1 tablet (500 mg total) by mouth 2 (two) times daily with a meal.  . methocarbamol (ROBAXIN) 500 MG tablet TAKE ONE TABLET BY MOUTH EVERY 8 HOURS AS NEEDED FOR MUSCLE SPASMS  . Misc. Devices  (CANE) MISC Use as needed for left knee pain.  . naproxen (NAPROSYN) 375 MG tablet Take 1 tablet (375 mg total) by mouth 2 (two) times daily. (Patient taking differently: Take 375 mg by mouth 2 (two) times daily as needed for moderate pain. )  . QUEtiapine (SEROQUEL) 300 MG tablet Take 1 tablet (300 mg total) by mouth at bedtime as needed (sleep).  . venlafaxine (EFFEXOR) 75 MG tablet Take 1 tablet (75 mg total) by mouth 3 (three) times daily with meals.  . [DISCONTINUED] estradiol (ESTRACE) 1 MG tablet Take 1 tablet (1  mg total) by mouth daily.  . [DISCONTINUED] medroxyPROGESTERone (PROVERA) 5 MG tablet Take 1 tablet (5 mg total) by mouth daily. (Patient not taking: Reported on 12/08/2019)  . [DISCONTINUED] sucralfate (CARAFATE) 1 g tablet Take 1 tablet (1 g total) by mouth 4 (four) times daily.   Facility-Administered Encounter Medications as of 03/06/2020  Medication  . cyanocobalamin ((VITAMIN B-12)) injection 1,000 mcg  :  Review of Systems:  Out of a complete 14 point review of systems, all are reviewed and negative with the exception of these symptoms as listed below: Review of Systems  Neurological:       Here for sleep consult. Prior sleep study 5+ years ago. Cpap was recommended but pt did not start. Would like for this to be readdressed. Pt reports she does snore.  Epworth Sleepiness Scale 0= would never doze 1= slight chance of dozing 2= moderate chance of dozing 3= high chance of dozing  Sitting and reading:2 Watching TV:2 Sitting inactive in a public place (ex. Theater or meeting):3 As a passenger in a car for an hour without a break:3 Lying down to rest in the afternoon:1 Sitting and talking to someone:0 Sitting quietly after lunch (no alcohol):0 In a car, while stopped in traffic:0 Total:11     Objective:  Neurological Exam  Physical Exam Physical Examination:   Vitals:   03/06/20 1540  BP: 122/60  Pulse: 82  SpO2: 97%    General Examination: The patient  is a very pleasant 59 y.o. female in no acute distress. She appears well-developed and well-nourished and well groomed.   HEENT: Normocephalic, atraumatic, pupils are equal, round and reactive to light, extraocular tracking is good without limitation to gaze excursion or nystagmus noted. Hearing is grossly intact. Face is symmetric with normal facial animation. Speech is clear with no dysarthria noted. There is no hypophonia. There is no lip, neck/head, jaw or voice tremor. Neck is supple with full range of passive and active motion. There are no carotid bruits on auscultation. Oropharynx exam reveals: mild mouth dryness, adequate dental hygiene and moderate airway crowding, due to tonsillar size of 2-3+ on the right and 1+ on the left, slightly longer uvula.  Mallampati is class II.  Neck circumference is 14-1/4 inches.  She has a minimal overbite.  Tongue protrudes centrally in palate elevates symmetrically.  Chest: Clear to auscultation without wheezing, rhonchi or crackles noted.  Heart: S1+S2+0, regular and normal without murmurs, rubs or gallops noted.   Abdomen: Soft, non-tender and non-distended with normal bowel sounds appreciated on auscultation.  Extremities: There is no pitting edema in the distal lower extremities bilaterally.   Skin: Warm and dry without trophic changes noted.   Musculoskeletal: exam reveals no obvious joint deformities, tenderness or joint swelling or erythema.   Neurologically:  Mental status: The patient is awake, alert and oriented in all 4 spheres. Her immediate and remote memory, attention, language skills and fund of knowledge are appropriate. There is no evidence of aphasia, agnosia, apraxia or anomia. Speech is clear with normal prosody and enunciation. Thought process is linear. Mood is normal and affect is normal.  Cranial nerves II - XII are as described above under HEENT exam.  Motor exam: Normal bulk, strength and tone is noted. There is no tremor,  Romberg is negative. Fine motor skills and coordination: grossly intact.  Cerebellar testing: No dysmetria or intention tremor. There is no truncal or gait ataxia.  Sensory exam: intact to light touch in the upper and lower extremities.  Gait, station and balance: She stands easily. No veering to one side is noted. No leaning to one side is noted. Posture is age-appropriate and stance is narrow based. Gait shows normal stride length and normal pace. No problems turning are noted. Tandem walk is challenging for her.   Assessment and Plan:  In summary, Stephanie Joseph is a very pleasant 20 y.o.-year old female with an underlying medical history of hypertension, gout, diabetes, anemia, arthritis, depression, mitral regurgitation and obesity, who presents for evaluation of her sleep disorder, in particular, evaluation of her prior diagnosis of obstructive sleep apnea.  Her history and examination are indeed concerning for underlying sleep disordered breathing.   I had a long chat with the patient about my findings and the diagnosis of OSA, its prognosis and treatment options. We talked about medical treatments, surgical interventions and non-pharmacological approaches. I explained in particular the risks and ramifications of untreated moderate to severe OSA, especially with respect to developing cardiovascular disease down the Road, including congestive heart failure, difficult to treat hypertension, cardiac arrhythmias, or stroke. Even type 2 diabetes has, in part, been linked to untreated OSA. Symptoms of untreated OSA include daytime sleepiness, memory problems, mood irritability and mood disorder such as depression and anxiety, lack of energy, as well as recurrent headaches, especially morning headaches. We talked about smoking cessation and trying to maintain a healthy lifestyle in general, as well as the importance of weight control. We also talked about the importance of good sleep hygiene. I  recommended the following at this time: sleep study.   I explained the sleep test procedure to the patient and also outlined possible surgical and non-surgical treatment options of OSA, including the use of a custom-made dental device (which would require a referral to a specialist dentist or oral surgeon), upper airway surgical options, such as traditional UPPP or a novel less invasive surgical option in the form of Inspire hypoglossal nerve stimulation (which would involve a referral to an ENT surgeon). I also explained the CPAP treatment option to the patient, who indicated that she would be willing to try CPAP if the need arises. I explained the importance of being compliant with PAP treatment, not only for insurance purposes but primarily to improve Her symptoms, and for the patient's long term health benefit, including to reduce Her cardiovascular risks. I answered all her questions today and the patient was in agreement. I plan to see her back after the sleep study is completed and encouraged her to call with any interim questions, concerns, problems or updates.   Thank you very much for allowing me to participate in the care of this nice patient. If I can be of any further assistance to you please do not hesitate to call me at [phone].  Sincerely,   Huston Foley, MD, PhD

## 2020-03-20 ENCOUNTER — Other Ambulatory Visit: Payer: Self-pay

## 2020-03-20 ENCOUNTER — Ambulatory Visit (INDEPENDENT_AMBULATORY_CARE_PROVIDER_SITE_OTHER): Admitting: Neurology

## 2020-03-20 ENCOUNTER — Ambulatory Visit (INDEPENDENT_AMBULATORY_CARE_PROVIDER_SITE_OTHER): Admitting: Psychiatry

## 2020-03-20 ENCOUNTER — Encounter (HOSPITAL_COMMUNITY): Payer: Self-pay | Admitting: Psychiatry

## 2020-03-20 DIAGNOSIS — F330 Major depressive disorder, recurrent, mild: Secondary | ICD-10-CM

## 2020-03-20 DIAGNOSIS — G4733 Obstructive sleep apnea (adult) (pediatric): Secondary | ICD-10-CM

## 2020-03-20 DIAGNOSIS — F411 Generalized anxiety disorder: Secondary | ICD-10-CM | POA: Diagnosis not present

## 2020-03-20 DIAGNOSIS — E669 Obesity, unspecified: Secondary | ICD-10-CM

## 2020-03-20 DIAGNOSIS — R519 Headache, unspecified: Secondary | ICD-10-CM

## 2020-03-20 DIAGNOSIS — R351 Nocturia: Secondary | ICD-10-CM

## 2020-03-20 DIAGNOSIS — F39 Unspecified mood [affective] disorder: Secondary | ICD-10-CM

## 2020-03-20 DIAGNOSIS — G4719 Other hypersomnia: Secondary | ICD-10-CM

## 2020-03-20 DIAGNOSIS — G4720 Circadian rhythm sleep disorder, unspecified type: Secondary | ICD-10-CM

## 2020-03-20 MED ORDER — HYDROXYZINE HCL 25 MG PO TABS: 25.0000 mg | ORAL_TABLET | Freq: Three times a day (TID) | ORAL | 2 refills | Status: DC | PRN

## 2020-03-20 MED ORDER — VENLAFAXINE HCL 75 MG PO TABS: 75.0000 mg | ORAL_TABLET | Freq: Three times a day (TID) | ORAL | 2 refills | Status: DC

## 2020-03-20 MED ORDER — QUETIAPINE FUMARATE 300 MG PO TABS: 300.0000 mg | ORAL_TABLET | Freq: Every evening | ORAL | 2 refills | Status: DC | PRN

## 2020-03-20 NOTE — Progress Notes (Signed)
BH MD/PA/NP OP Progress Note  03/20/2020 9:37 AM Stephanie Joseph  MRN:  [id number]  Chief Complaint:  "I need space"  HPI: 59 year old female seen today for follow up psychiatric evaluation.   She has a psychiatric history of depression, anxiety, alochol use (in remission), cannabis use disorder (in remission), and cocaine use disorder (in remission).  She is managed on hydroxyzine 25 mg every 8 hours as needed, Seroquel 300 nightly, and Effexor 75 mg three times daily. She informed Clinical research associate that she has been sober from substances for over 25 years noting that her church was influential in her sobriety.   Today she is well groomed, pleasant, cooperative, engaged in conversation, and maintained eye contact. She notes that she is doing well and denies symptoms of anxiety, depression, mania, SI/HI/VAH or paranoia. She notes that her medications are effective in managing her psychiatric conditions. Patient reports that she needs space from her husband. She informed Clinical research associate that he is protective and loving however she notes that she needs time for her self. She notes that her grandmother will be turing 39 soon and notes that she would like to visit her alone.  She notes that she only has alone time at church. Writer encouraged patient to go on social outings with her church community or engage in marriage counseling. She endorsed understanding and agreed.   No medication adjustments made today Patient is agreeable to continuing all medications as prescribed. At this time she is not interested in counseling. No other concerns noted at this time.  Visit Diagnosis:    ICD-10-CM   1. Generalized anxiety disorder  F41.1 hydrOXYzine (ATARAX/VISTARIL) 25 MG tablet  2. Mild episode of recurrent major depressive disorder (HCC)  F33.0 QUEtiapine (SEROQUEL) 300 MG tablet    venlafaxine (EFFEXOR) 75 MG tablet    Past Psychiatric History:depression, anxiety, alochol use (in remission), cannabis use  disorder (in remission), and cocaine use disorder (in remission) Past Medical History:  Past Medical History:  Diagnosis Date  . Anemia, iron deficiency   . Arthritis   . Depression   . Diabetes mellitus without complication (HCC)   . Dysmenorrhea   . Gout   . Heart valve problem   . Hypertension   . Menorrhagia   . Mitral regurgitation   . Pneumonia     Past Surgical History:  Procedure Laterality Date  . KNEE ARTHROSCOPY     Left    Family Psychiatric History: Brother schizophrenia (deceased)  Family History:  Family History  Problem Relation Age of Onset  . Hypertension Mother   . Stroke Paternal Grandmother   . Heart disease Sister   . Heart disease Brother     Social History:  Social History   Socioeconomic History  . Marital status: Married    Spouse name: Not on file  . Number of children: 1  . Years of education: 65  . Highest education level: Not on file  Occupational History  . Occupation: Disability Pending  Tobacco Use  . Smoking status: Current Every Day Smoker    Packs/day: 0.50    Years: 7.00    Pack years: 3.50    Types: Cigarettes  . Smokeless tobacco: Never Used  Vaping Use  . Vaping Use: Never used  Substance and Sexual Activity  . Alcohol use: No  . Drug use: No    Types: "Crack" cocaine  . Sexual activity: Yes    Birth control/protection: None  Other Topics Concern  . Not on file  Social History Narrative   Fun: Go to church   Denies abuse and feels safe at home.    Social Determinants of Health   Financial Resource Strain:   . Difficulty of Paying Living Expenses: Not on file  Food Insecurity:   . Worried About Programme researcher, broadcasting/film/video in the Last Year: Not on file  . Ran Out of Food in the Last Year: Not on file  Transportation Needs:   . Lack of Transportation (Medical): Not on file  . Lack of Transportation (Non-Medical): Not on file  Physical Activity:   . Days of Exercise per Week: Not on file  . Minutes of Exercise  per Session: Not on file  Stress:   . Feeling of Stress : Not on file  Social Connections:   . Frequency of Communication with Friends and Family: Not on file  . Frequency of Social Gatherings with Friends and Family: Not on file  . Attends Religious Services: Not on file  . Active Member of Clubs or Organizations: Not on file  . Attends Banker Meetings: Not on file  . Marital Status: Not on file    Allergies: No Known Allergies  Metabolic Disorder Labs: Lab Results  Component Value Date   HGBA1C 8.5 (H) 01/27/2020   MPG 197 01/27/2020   No results found for: PROLACTIN Lab Results  Component Value Date   CHOL 114 01/27/2020   TRIG 60 01/27/2020   HDL 69 01/27/2020   CHOLHDL 1.7 01/27/2020   VLDL 97.9 11/19/2018   LDLCALC 31 01/27/2020   LDLCALC 28 11/19/2018   Lab Results  Component Value Date   TSH 0.59 05/10/2019   TSH 0.55 09/16/2017    Therapeutic Level Labs: No results found for: LITHIUM No results found for: VALPROATE No components found for:  CBMZ  Current Medications: Current Outpatient Medications  Medication Sig Dispense Refill  . amLODipine (NORVASC) 10 MG tablet Take 1 tablet (10 mg total) by mouth daily. 90 tablet 3  . atorvastatin (LIPITOR) 10 MG tablet Take 1 tablet (10 mg total) by mouth daily. 90 tablet 3  . betamethasone dipropionate (DIPROLENE) 0.05 % ointment Apply topically 2 (two) times daily. 30 g 0  . brimonidine-timolol (COMBIGAN) 0.2-0.5 % ophthalmic solution Place 1 drop into both eyes every 12 (twelve) hours.    Marland Kitchen glimepiride (AMARYL) 2 MG tablet Take 1 tablet (2 mg total) by mouth daily with breakfast. Overdue for appt must see provider for future refills 90 tablet 1  . glucose blood (ACCU-CHEK AVIVA) test strip Use to check blood sugars 1-3 times daily. Dx Code: E11.9 300 each 1  . hydrOXYzine (ATARAX/VISTARIL) 25 MG tablet Take 1 tablet (25 mg total) by mouth every 8 (eight) hours as needed for itching. 120 tablet 2  .  Lancets (ACCU-CHEK SOFT TOUCH) lancets Use as instructed 100 each 12  . losartan-hydrochlorothiazide (HYZAAR) 100-12.5 MG tablet Take 1 tablet by mouth daily. 90 tablet 3  . metFORMIN (GLUCOPHAGE) 500 MG tablet Take 1 tablet (500 mg total) by mouth 2 (two) times daily with a meal. 180 tablet 1  . methocarbamol (ROBAXIN) 500 MG tablet TAKE ONE TABLET BY MOUTH EVERY 8 HOURS AS NEEDED FOR MUSCLE SPASMS 90 tablet 3  . Misc. Devices (CANE) MISC Use as needed for left knee pain. 1 each 0  . naproxen (NAPROSYN) 375 MG tablet Take 1 tablet (375 mg total) by mouth 2 (two) times daily. (Patient taking differently: Take 375 mg by mouth 2 (two)  times daily as needed for moderate pain. ) 60 tablet 0  . QUEtiapine (SEROQUEL) 300 MG tablet Take 1 tablet (300 mg total) by mouth at bedtime as needed (sleep). 30 tablet 2  . venlafaxine (EFFEXOR) 75 MG tablet Take 1 tablet (75 mg total) by mouth 3 (three) times daily with meals. 90 tablet 2   Current Facility-Administered Medications  Medication Dose Route Frequency Provider Last Rate Last Admin  . cyanocobalamin ((VITAMIN B-12)) injection 1,000 mcg  1,000 mcg Intramuscular Once Olive Bass, FNP         Musculoskeletal: Strength & Muscle Tone: within normal limits Gait & Station: normal Patient leans: N/A  Psychiatric Specialty Exam: Review of Systems  Last menstrual period 02/13/2014.There is no height or weight on file to calculate BMI.  General Appearance: Well Groomed  Eye Contact:  Good  Speech:  Clear and Coherent and Normal Rate  Volume:  Normal  Mood:  Euthymic  Affect:  Congruent  Thought Process:  Coherent, Goal Directed and Linear  Orientation:  Full (Time, Place, and Person)  Thought Content: WDL and Logical   Suicidal Thoughts:  No  Homicidal Thoughts:  No  Memory:  Immediate;   Good Recent;   Good Remote;   Good  Judgement:  Good  Insight:  Good  Psychomotor Activity:  Normal  Concentration:  Concentration: Good and  Attention Span: Good  Recall:  Good  Fund of Knowledge: Good  Language: Good  Akathisia:  No  Handed:  Right  AIMS (if indicated): not done  Assets:  Communication Skills Desire for Improvement Financial Resources/Insurance Housing Social Support  ADL's:  Intact  Cognition: WNL  Sleep:  Good   Screenings: GAD-7     Office Visit from 04/27/2019 in [facility]  Total GAD-7 Score 10    PHQ2-9     Office Visit from 01/27/2020 in [facility] Visit from 03/05/2018 in [facility]  PHQ-2 Total Score 0 2  PHQ-9 Total Score 0 --       Assessment and Plan:  Patient reports that she is doing well on current medication regimen.  She is agreeable to continue all medications as prescribed.  1. Mild episode of recurrent major depressive disorder (HCC)  Continue- venlafaxine (EFFEXOR) 75 MG tablet; Take 1 tablet (75 mg total) by mouth 3 (three) times daily with meals.  Dispense: 90 tablet; Refill: 2 Continue- QUEtiapine (SEROQUEL) 300 MG tablet; Take 1 tablet (300 mg total) by mouth at bedtime as needed (sleep).  Dispense: 30 tablet; Refill: 2  2. Generalized anxiety disorder  Continue- hydrOXYzine (ATARAX/VISTARIL) 25 MG tablet; Take 1 tablet (25 mg total) by mouth every 8 (eight) hours as needed for anxiety.  Dispense: 120 tablet; Refill: 2  Follow-up in 3 month   Shanna Cisco, NP 03/20/2020, 9:37 AM

## 2020-03-28 ENCOUNTER — Telehealth: Payer: Self-pay

## 2020-03-28 NOTE — Procedures (Signed)
PATIENT'S NAME:  Stephanie Joseph, Stephanie Joseph DOB:      Nov 20, 1960      MR#:    [id number]     DATE OF RECORDING: 03/20/2020 REFERRING M.D.:  Ria Clock FNP Study Performed:   Baseline Polysomnogram HISTORY: 59 year old woman with a history of hypertension, gout, diabetes, anemia, arthritis, depression, mitral regurgitation and obesity, who reports snoring, daytime somnolence, and nocturia. She was previously diagnosed with obstructive sleep apnea. The patient endorsed the Epworth Sleepiness Scale at 11 points. The patient's weight 216 pounds with a height of 67 (inches), resulting in a BMI of 33.9 kg/m2. The patient's neck circumference measured 14.25 inches.  CURRENT MEDICATIONS: Norvasc, Lipitor, Diprolene, Combigan, Amaryl, Atarax, Hyzaar, Metformin, Robaxin, Naproxen, Seroquel, Effexor   PROCEDURE:  This is a multichannel digital polysomnogram utilizing the Somnostar 11.2 system.  Electrodes and sensors were applied and monitored per AASM Specifications.   EEG, EOG, Chin and Limb EMG, were sampled at 200 Hz.  ECG, Snore and Nasal Pressure, Thermal Airflow, Respiratory Effort, CPAP Flow and Pressure, Oximetry was sampled at 50 Hz. Digital video and audio were recorded.      BASELINE STUDY  Lights Out was at 20:47 and Lights On at 05:00.  Total recording time (TRT) was 493.5 minutes, with a total sleep time (TST) of 305 minutes.   The patient's sleep latency was 21 minutes.  REM latency was 214 minutes, which is delayed. The sleep efficiency was 61.8%, which is reduced.     SLEEP ARCHITECTURE: WASO (Wake after sleep onset) was 167 minutes with moderate to severe sleep fragmentation noted. There were 79.5 minutes in Stage N1, 86 minutes Stage N2, 110 minutes Stage N3 and 29.5 minutes in Stage REM.  The percentage of Stage N1 was 26.1%, which is markedly increased, Stage N2 was 28.2%, Stage N3 was 36.1%, which is increased, and Stage R (REM sleep) was 9.7%, which is reduced. The arousals were noted  as: 45 were spontaneous, 0 were associated with PLMs, 13 were associated with respiratory events.  RESPIRATORY ANALYSIS:  There were a total of 57 respiratory events:  8 obstructive apneas, 0 central apneas and 3 mixed apneas with a total of 11 apneas and an apnea index (AI) of 2.2 /hour. There were 46 hypopneas with a hypopnea index of 9. /hour. The patient also had 0 respiratory event related arousals (RERAs).      The total APNEA/HYPOPNEA INDEX (AHI) was 11.2/hour and the total RESPIRATORY DISTURBANCE INDEX was  11.2 /hour.  9 events occurred in REM sleep and 80 events in NREM. The REM AHI was  18.3 /hour, versus a non-REM AHI of 10.5. The patient spent 137 minutes of total sleep time in the supine position and 168 minutes in non-supine.. The supine AHI was 11.4 versus a non-supine AHI of 11.0.  OXYGEN SATURATION & C02:  The Wake baseline 02 saturation was 96%, with the lowest being 82%. Time spent below 89% saturation equaled 126 minutes. PERIODIC LIMB MOVEMENTS: The patient had a total of 0 Periodic Limb Movements.  The Periodic Limb Movement (PLM) index was 0 and the PLM Arousal index was 0/hour.  Audio and video analysis did not show any abnormal or unusual movements, behaviors, phonations or vocalizations. The patient took 2 bathroom breaks. Mild to moderate snoring was noted. The EKG was in keeping with normal sinus rhythm (NSR).  Post-study, the patient indicated that sleep was the same as usual.   IMPRESSION:  1. Obstructive Sleep Apnea (OSA) 2. Dysfunctions associated with sleep  stages or arousal from sleep  RECOMMENDATIONS:  1. This study demonstrates mild to moderate obstructive sleep apnea, with a total AHI of 11.2/hour, REM AHI of 18.4/hour, supine AHI of 11.4/hour and O2 nadir of 82%. Given the patient's medical history and sleep related complaints, treatment with positive airway pressure is recommended; this can be achieved in the form of autoPAP. Alternatively, a full-night  CPAP titration study would allow optimization of therapy if needed. Other treatment options may include avoidance of supine sleep position along with weight loss, upper airway or jaw surgery in selected patients or the use of an oral appliance in certain patients. ENT evaluation and/or consultation with a maxillofacial surgeon or dentist may be feasible in some instances.    2. Please note that untreated obstructive sleep apnea may carry additional perioperative morbidity. Patients with significant obstructive sleep apnea should receive perioperative PAP therapy and the surgeons and particularly the anesthesiologist should be informed of the diagnosis and the severity of the sleep disordered breathing. 3. This study shows sleep fragmentation and abnormal sleep stage percentages; these are nonspecific findings and per se do not signify an intrinsic sleep disorder or a cause for the patient's sleep-related symptoms. Causes include (but are not limited to) the first night effect of the sleep study, circadian rhythm disturbances, medication effect or an underlying mood disorder or medical problem.  4. The patient should be cautioned not to drive, work at heights, or operate dangerous or heavy equipment when tired or sleepy. Review and reiteration of good sleep hygiene measures should be pursued with any patient. 5. The patient will be seen in follow-up by Dr. Frances Furbish at [facility] for discussion of the test results and further management strategies. The referring provider will be notified of the test results.  I certify that I have reviewed the entire raw data recording prior to the issuance of this report in accordance with the Standards of Accreditation of the American Academy of Sleep Medicine (AASM)   Huston Foley, MD, PhD Diplomat, American Board of Neurology and Sleep Medicine (Neurology and Sleep Medicine)

## 2020-03-28 NOTE — Addendum Note (Signed)
Addended by: Huston Foley on: 03/28/2020 08:53 AM   Modules accepted: Orders

## 2020-03-28 NOTE — Telephone Encounter (Signed)
-----   Message from Huston Foley, MD sent at 03/28/2020  8:53 AM EDT ----- Patient referred by primary care NP, Ms. Dayton Scrape, seen by me on 03/06/20 for re-eval of her prior Dx of OSA, diagnostic PSG on 03/20/20.    Please call and notify the patient that the recent sleep study did confirm the diagnosis of obstructive sleep apnea. OSA is in the mild to moderate range and I recommend treatment for this in the form of autoPAP, which means, that we don't have to bring her back for a second sleep study with CPAP, but will let him try an autoPAP machine at home, through a DME company (of her choice, or as per insurance requirement). The DME representative will educate her on how to use the machine, how to put the mask on, etc. I have placed an order in the chart. Please send referral, talk to patient, send report to referring MD. We will need a FU in sleep clinic for 10 weeks post-PAP set up, please arrange that with me or one of our NPs. Thanks,   Huston Foley, MD, PhD [facility])

## 2020-03-28 NOTE — Telephone Encounter (Signed)
I called pt. No answer, left a message asking pt to call me back.   

## 2020-03-28 NOTE — Progress Notes (Signed)
Patient referred by primary care NP, Ms. Dayton Scrape, seen by me on 03/06/20 for re-eval of her prior Dx of OSA, diagnostic PSG on 03/20/20.    Please call and notify the patient that the recent sleep study did confirm the diagnosis of obstructive sleep apnea. OSA is in the mild to moderate range and I recommend treatment for this in the form of autoPAP, which means, that we don't have to bring her back for a second sleep study with CPAP, but will let him try an autoPAP machine at home, through a DME company (of her choice, or as per insurance requirement). The DME representative will educate her on how to use the machine, how to put the mask on, etc. I have placed an order in the chart. Please send referral, talk to patient, send report to referring MD. We will need a FU in sleep clinic for 10 weeks post-PAP set up, please arrange that with me or one of our NPs. Thanks,   Huston Foley, MD, PhD [facility])

## 2020-04-04 NOTE — Telephone Encounter (Signed)
I called pt. I advised pt that Dr. Frances Furbish reviewed their sleep study results and found that pt has mild osa. Dr. Frances Furbish  recommends that pt start an autopap for treament. I reviewed PAP compliance expectations with the pt. Pt is agreeable to starting an auto-PAP. I advised pt that an order will be sent to a DME, [employer], and [employer] will call the pt within about one week after they file with the pt's insurance. [employer] will show the pt how to use the machine, fit for masks, and troubleshoot the auto-PAP if needed. Pt was advised to call me back in 1 week if she has not heard from [employer].

## 2020-04-05 NOTE — Telephone Encounter (Signed)
I called the pt. Pt is under [employer].  I advised I would send the order for her cpap to the [place] [facility] and see what they say about coverage.  Pt was advised to call me back if she has no heard in 1 week.  Order sent to [facility] in [place] Fax # [phone]. Will hold off on sending to [employer] at this time.

## 2020-04-10 ENCOUNTER — Other Ambulatory Visit: Payer: Self-pay

## 2020-04-10 ENCOUNTER — Encounter: Payer: Self-pay | Admitting: Family

## 2020-04-10 ENCOUNTER — Ambulatory Visit (INDEPENDENT_AMBULATORY_CARE_PROVIDER_SITE_OTHER): Admitting: Family

## 2020-04-10 VITALS — BP 124/72 | HR 85 | Temp 98.6°F | Ht 67.0 in | Wt 217.8 lb

## 2020-04-10 DIAGNOSIS — Z720 Tobacco use: Secondary | ICD-10-CM | POA: Diagnosis not present

## 2020-04-10 DIAGNOSIS — E1165 Type 2 diabetes mellitus with hyperglycemia: Secondary | ICD-10-CM | POA: Diagnosis not present

## 2020-04-10 DIAGNOSIS — E538 Deficiency of other specified B group vitamins: Secondary | ICD-10-CM

## 2020-04-10 DIAGNOSIS — Z1159 Encounter for screening for other viral diseases: Secondary | ICD-10-CM

## 2020-04-10 LAB — COMPREHENSIVE METABOLIC PANEL
ALT: 13 U/L (ref 0–35)
AST: 12 U/L (ref 0–37)
Albumin: 3.7 g/dL (ref 3.5–5.2)
Alkaline Phosphatase: 66 U/L (ref 39–117)
BUN: 10 mg/dL (ref 6–23)
CO2: 26 mEq/L (ref 19–32)
Calcium: 8.9 mg/dL (ref 8.4–10.5)
Chloride: 105 mEq/L (ref 96–112)
Creatinine, Ser: 0.83 mg/dL (ref 0.40–1.20)
GFR: 76.94 mL/min (ref >60.00)
Glucose, Bld: 190 mg/dL — ABNORMAL HIGH (ref 70–99)
Potassium: 4.1 mEq/L (ref 3.5–5.1)
Sodium: 138 mEq/L (ref 135–145)
Total Bilirubin: 0.3 mg/dL (ref 0.2–1.2)
Total Protein: 6.6 g/dL (ref 6.0–8.3)

## 2020-04-10 LAB — VITAMIN B12: Vitamin B-12: 265 pg/mL (ref 211–911)

## 2020-04-10 LAB — HEMOGLOBIN A1C: Hgb A1c MFr Bld: 7.3 % — ABNORMAL HIGH (ref 4.6–6.5)

## 2020-04-10 NOTE — Progress Notes (Signed)
Stephanie Joseph is a 59 y.o. female with the following history as recorded in EpicCare:  Patient Active Problem List   Diagnosis Date Noted  . Chronic midline low back pain 07/12/2018  . Acute pharyngitis 09/16/2017  . Abnormal TSH 09/16/2017  . Glaucoma 05/21/2017  . Tooth pain 04/28/2017  . Encounter for general adult medical examination with abnormal findings 09/19/2016  . Class 1 obesity due to excess calories with serious comorbidity and body mass index (BMI) of 33.0 to 33.9 in adult 09/19/2016  . Vitamin D deficiency 09/19/2016  . Urinary frequency 02/14/2016  . Back pain 10/18/2015  . Type 2 diabetes mellitus with hyperglycemia, without long-term current use of insulin ([place]) 07/10/2015  . Hormone replacement therapy (postmenopausal) 10/04/2014  . Mitral regurgitation 11/05/2012  . Aortic valve disease 10/21/2011  . Menorrhagia, premenopausal 09/01/2011  . Depression 03/04/2010  . GOUT 12/16/2006  . ANEMIA, IRON DEFICIENCY NOS 12/16/2006  . TOBACCO ABUSE 12/16/2006  . Essential hypertension 12/16/2006    Current Outpatient Medications  Medication Sig Dispense Refill  . amLODipine (NORVASC) 10 MG tablet Take 1 tablet (10 mg total) by mouth daily. 90 tablet 3  . atorvastatin (LIPITOR) 10 MG tablet Take 1 tablet (10 mg total) by mouth daily. 90 tablet 3  . brimonidine-timolol (COMBIGAN) 0.2-0.5 % ophthalmic solution Place 1 drop into both eyes every 12 (twelve) hours.    Marland Kitchen glimepiride (AMARYL) 2 MG tablet Take 1 tablet (2 mg total) by mouth daily with breakfast. Overdue for appt must see provider for future refills 90 tablet 1  . glucose blood (ACCU-CHEK AVIVA) test strip Use to check blood sugars 1-3 times daily. Dx Code: E11.9 300 each 1  . hydrOXYzine (ATARAX/VISTARIL) 25 MG tablet Take 1 tablet (25 mg total) by mouth every 8 (eight) hours as needed for itching. (Patient taking differently: Take 25 mg by mouth every 8 (eight) hours as needed for anxiety. ) 120 tablet  2  . Lancets (ACCU-CHEK SOFT TOUCH) lancets Use as instructed 100 each 12  . losartan-hydrochlorothiazide (HYZAAR) 100-12.5 MG tablet Take 1 tablet by mouth daily. 90 tablet 3  . metFORMIN (GLUCOPHAGE) 500 MG tablet Take 1 tablet (500 mg total) by mouth 2 (two) times daily with a meal. (Patient taking differently: Take 1,000 mg by mouth 2 (two) times daily with a meal. ) 180 tablet 1  . methocarbamol (ROBAXIN) 500 MG tablet TAKE ONE TABLET BY MOUTH EVERY 8 HOURS AS NEEDED FOR MUSCLE SPASMS 90 tablet 3  . Misc. Devices (CANE) MISC Use as needed for left knee pain. 1 each 0  . naproxen (NAPROSYN) 375 MG tablet Take 1 tablet (375 mg total) by mouth 2 (two) times daily. (Patient taking differently: Take 375 mg by mouth 2 (two) times daily as needed for moderate pain. ) 60 tablet 0  . QUEtiapine (SEROQUEL) 300 MG tablet Take 1 tablet (300 mg total) by mouth at bedtime as needed (sleep). 30 tablet 2  . venlafaxine (EFFEXOR) 75 MG tablet Take 1 tablet (75 mg total) by mouth 3 (three) times daily with meals. 90 tablet 2   No current facility-administered medications for this visit.    Allergies: Patient has no known allergies.  Past Medical History:  Diagnosis Date  . Anemia, iron deficiency   . Arthritis   . Depression   . Diabetes mellitus without complication ([place])   . Dysmenorrhea   . Gout   . Heart valve problem   . Hypertension   . Menorrhagia   .  Mitral regurgitation   . Pneumonia     Past Surgical History:  Procedure Laterality Date  . KNEE ARTHROSCOPY     Left    Family History  Problem Relation Age of Onset  . Hypertension Mother   . Stroke Paternal Grandmother   . Heart disease Sister   . Heart disease Brother     Social History   Tobacco Use  . Smoking status: Current Every Day Smoker    Packs/day: 0.50    Years: 7.00    Pack years: 3.50    Types: Cigarettes  . Smokeless tobacco: Never Used  Substance Use Topics  . Alcohol use: No    Subjective:  2 month follow  up on Type 2 Diabetes; at last OV, hgba1c was up to 8.5; she was asked to increase Metformin to bid and continue Amaryl daily; denies any concerns for low blood sugar; has been working on weight loss; not ready to quit smoking- has smoked for 40 years;      Objective:  Vitals:   04/10/20 0901  BP: 124/72  Pulse: 85  Temp: 98.6 F (37 C)  TempSrc: Oral  SpO2: 99%  Weight: 217 lb 12.8 oz (98.8 kg)  Height: '5\' 7"'  (1.702 m)    General: Well developed, well nourished, in no acute distress  Skin : Warm and dry.  Head: Normocephalic and atraumatic  Lungs: Respirations unlabored; clear to auscultation bilaterally without wheeze, rales, rhonchi  CVS exam: normal rate and regular rhythm.  Musculoskeletal: No deformities; no active joint inflammation  Extremities: No edema, cyanosis, clubbing  Vessels: Symmetric bilaterally  Neurologic: Alert and oriented; speech intact; face symmetrical; moves all extremities well; CNII-XII intact without focal deficit   Assessment:  1. Type 2 diabetes mellitus with hyperglycemia, without long-term current use of insulin ([place])   2. B12 deficiency   3. Need for hepatitis C screening test   4. Tobacco abuse     Plan:  1. Update labs today; follow-up to be determined;  2. Check B12 level today; 3. Update Hep C screen; 4. Stressed need to quit smoking; refer for lung cancer screen- she will consider having this test done;  She defers flu shot;  This visit occurred during the SARS-CoV-2 public health emergency.  Safety protocols were in place, including screening questions prior to the visit, additional usage of staff PPE, and extensive cleaning of exam room while observing appropriate contact time as indicated for disinfecting solutions.   Time spent 30 minutes   No follow-ups on file.  Orders Placed This Encounter  Procedures  . B12    Standing Status:   Future    Standing Expiration Date:   04/10/2021  . Hepatitis C Antibody    Standing  Status:   Future    Standing Expiration Date:   04/10/2021  . Comp Met (CMET)    Standing Status:   Future    Standing Expiration Date:   04/10/2021  . Hemoglobin A1c    Standing Status:   Future    Standing Expiration Date:   04/10/2021  . Ambulatory Referral for Lung Cancer Scre    Referral Priority:   Routine    Referral Type:   Consultation    Referral Reason:   Specialty Services Required    Number of Visits Requested:   1    Requested Prescriptions    No prescriptions requested or ordered in this encounter

## 2020-04-10 NOTE — Patient Instructions (Signed)

## 2020-04-10 NOTE — Addendum Note (Signed)
Addended by: Vincenza Hews on: 04/10/2020 09:34 AM   Modules accepted: Orders

## 2020-04-11 ENCOUNTER — Encounter: Payer: Self-pay | Admitting: Family

## 2020-04-11 LAB — HEPATITIS C ANTIBODY
Hepatitis C Ab: NONREACTIVE
SIGNAL TO CUT-OFF: 0.01 (ref <1.00)

## 2020-04-12 ENCOUNTER — Other Ambulatory Visit: Payer: Self-pay | Admitting: Family

## 2020-04-12 DIAGNOSIS — Z1231 Encounter for screening mammogram for malignant neoplasm of breast: Secondary | ICD-10-CM

## 2020-05-16 ENCOUNTER — Telehealth: Payer: Self-pay | Admitting: Neurology

## 2020-05-16 ENCOUNTER — Other Ambulatory Visit: Payer: Self-pay

## 2020-05-16 ENCOUNTER — Ambulatory Visit
Admission: RE | Admit: 2020-05-16 | Discharge: 2020-05-16 | Disposition: A | Discharge status: Home / Self Care | Source: Ambulatory Visit | Attending: Family | Admitting: Family

## 2020-05-16 DIAGNOSIS — Z1231 Encounter for screening mammogram for malignant neoplasm of breast: Secondary | ICD-10-CM

## 2020-05-16 NOTE — Telephone Encounter (Signed)
[facility]. [facility]. I confirmed that she has [employer]. I will reach out to [facility] to find out if they can help her. Pt verbalized understanding.

## 2020-05-16 NOTE — Telephone Encounter (Signed)
Pt left a message in the sleep lab asking for a call back from Stephanie Joseph. Pt didn't stated what her call was regarding. Please advise.

## 2020-05-18 ENCOUNTER — Encounter (HOSPITAL_COMMUNITY): Admitting: Psychiatry

## 2020-05-21 NOTE — Telephone Encounter (Signed)
Received this notice from [facility]: "Yes ma'am, we can accept [facility]."  I will ask [facility] to process the cpap order.

## 2020-05-30 ENCOUNTER — Encounter (HOSPITAL_COMMUNITY): Admitting: Psychiatry

## 2020-06-11 ENCOUNTER — Telehealth: Payer: Self-pay | Admitting: Family

## 2020-06-11 NOTE — Telephone Encounter (Signed)
    How often should patient get B12? Appointment scheduled for B12 for 12/16

## 2020-06-11 NOTE — Telephone Encounter (Signed)
Patient has been advised

## 2020-06-11 NOTE — Telephone Encounter (Signed)
Let's plan for monthly for the next 6 months and then we will re-evaluate.

## 2020-06-14 ENCOUNTER — Other Ambulatory Visit: Payer: Self-pay

## 2020-06-14 ENCOUNTER — Encounter (HOSPITAL_COMMUNITY): Payer: Self-pay | Admitting: Psychiatry

## 2020-06-14 ENCOUNTER — Ambulatory Visit (INDEPENDENT_AMBULATORY_CARE_PROVIDER_SITE_OTHER): Admitting: Psychiatry

## 2020-06-14 DIAGNOSIS — F411 Generalized anxiety disorder: Secondary | ICD-10-CM

## 2020-06-14 DIAGNOSIS — F330 Major depressive disorder, recurrent, mild: Secondary | ICD-10-CM

## 2020-06-14 MED ORDER — VENLAFAXINE HCL 75 MG PO TABS: 75.0000 mg | ORAL_TABLET | Freq: Three times a day (TID) | ORAL | 2 refills | Status: DC

## 2020-06-14 MED ORDER — HYDROXYZINE HCL 25 MG PO TABS: 25.0000 mg | ORAL_TABLET | Freq: Three times a day (TID) | ORAL | 2 refills | Status: DC | PRN

## 2020-06-14 MED ORDER — BUSPIRONE HCL 10 MG PO TABS: 10.0000 mg | ORAL_TABLET | Freq: Three times a day (TID) | ORAL | 2 refills | Status: DC

## 2020-06-14 MED ORDER — QUETIAPINE FUMARATE 300 MG PO TABS: 300.0000 mg | ORAL_TABLET | Freq: Every evening | ORAL | 2 refills | Status: DC | PRN
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
## 2020-06-21 ENCOUNTER — Other Ambulatory Visit: Payer: Self-pay

## 2020-06-21 ENCOUNTER — Ambulatory Visit (INDEPENDENT_AMBULATORY_CARE_PROVIDER_SITE_OTHER)

## 2020-06-21 DIAGNOSIS — E538 Deficiency of other specified B group vitamins: Secondary | ICD-10-CM

## 2020-06-21 MED ORDER — CYANOCOBALAMIN 1000 MCG/ML IJ SOLN
1000.0000 ug | INTRAMUSCULAR | Status: AC
  Administered 2020-06-21 – 2020-07-26 (×2): 1000 ug via INTRAMUSCULAR

## 2020-06-21 NOTE — Progress Notes (Signed)
Pt here for monthly B12 injection per Ria Clock, FNP.  B12 given IM left deltoid and pt tolerated injection well.  Pt to schedule next B12 injection upon check out.  Dr Okey Dupre: can you please cosign since Vernona Rieger is out of office?

## 2020-06-26 ENCOUNTER — Telehealth: Payer: Self-pay | Admitting: Family

## 2020-06-26 NOTE — Telephone Encounter (Signed)
1.Medication Requested:metFORMIN (GLUCOPHAGE) 500 MG tablet    2. Pharmacy (Name, Street, [place]):[id number] - [place], [place] - [address]  3. On Med List: yes   4. Last Visit with PCP: 10.5.21  5. Next visit date with PCP:n/a    Agent: Please be advised that RX refills may take up to 3 business days. We ask that you follow-up with your pharmacy.

## 2020-06-27 ENCOUNTER — Other Ambulatory Visit: Payer: Self-pay | Admitting: Family

## 2020-06-27 MED ORDER — METFORMIN HCL 500 MG PO TABS: 500.0000 mg | ORAL_TABLET | Freq: Two times a day (BID) | ORAL | 1 refills | Status: DC

## 2020-07-11 ENCOUNTER — Other Ambulatory Visit: Payer: Self-pay

## 2020-07-11 ENCOUNTER — Ambulatory Visit (INDEPENDENT_AMBULATORY_CARE_PROVIDER_SITE_OTHER): Admitting: Licensed Clinical Social Worker

## 2020-07-11 DIAGNOSIS — F418 Other specified anxiety disorders: Secondary | ICD-10-CM | POA: Diagnosis not present
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
## 2020-07-13 ENCOUNTER — Encounter: Payer: Self-pay | Admitting: Family

## 2020-07-13 ENCOUNTER — Ambulatory Visit (INDEPENDENT_AMBULATORY_CARE_PROVIDER_SITE_OTHER)

## 2020-07-13 ENCOUNTER — Other Ambulatory Visit: Payer: Self-pay

## 2020-07-13 ENCOUNTER — Ambulatory Visit (INDEPENDENT_AMBULATORY_CARE_PROVIDER_SITE_OTHER): Admitting: Family

## 2020-07-13 VITALS — BP 122/80 | HR 86 | Temp 98.7°F | Ht 67.0 in | Wt 213.8 lb

## 2020-07-13 DIAGNOSIS — G8929 Other chronic pain: Secondary | ICD-10-CM

## 2020-07-13 DIAGNOSIS — R202 Paresthesia of skin: Secondary | ICD-10-CM | POA: Diagnosis not present

## 2020-07-13 DIAGNOSIS — R200 Anesthesia of skin: Secondary | ICD-10-CM

## 2020-07-13 DIAGNOSIS — E559 Vitamin D deficiency, unspecified: Secondary | ICD-10-CM | POA: Diagnosis not present

## 2020-07-13 DIAGNOSIS — E538 Deficiency of other specified B group vitamins: Secondary | ICD-10-CM | POA: Diagnosis not present

## 2020-07-13 DIAGNOSIS — M25562 Pain in left knee: Secondary | ICD-10-CM

## 2020-07-13 DIAGNOSIS — M545 Low back pain, unspecified: Secondary | ICD-10-CM

## 2020-07-13 DIAGNOSIS — E1165 Type 2 diabetes mellitus with hyperglycemia: Secondary | ICD-10-CM | POA: Diagnosis not present

## 2020-07-13 LAB — VITAMIN D 25 HYDROXY (VIT D DEFICIENCY, FRACTURES): VITD: 33.96 ng/mL (ref 30.00–100.00)

## 2020-07-13 LAB — VITAMIN B12: Vitamin B-12: 501 pg/mL (ref 211–911)

## 2020-07-13 LAB — HEMOGLOBIN A1C: Hgb A1c MFr Bld: 6.5 % (ref 4.6–6.5)

## 2020-07-13 MED ORDER — NAPROXEN 375 MG PO TABS: 375.0000 mg | ORAL_TABLET | Freq: Two times a day (BID) | ORAL | 0 refills | Status: DC | PRN

## 2020-07-13 NOTE — Progress Notes (Signed)
Stephanie Joseph is a 60 y.o. female with the following history as recorded in EpicCare:  Patient Active Problem List   Diagnosis Date Noted  . Chronic midline low back pain 07/12/2018  . Acute pharyngitis 09/16/2017  . Abnormal TSH 09/16/2017  . Glaucoma 05/21/2017  . Tooth pain 04/28/2017  . Encounter for general adult medical examination with abnormal findings 09/19/2016  . Class 1 obesity due to excess calories with serious comorbidity and body mass index (BMI) of 33.0 to 33.9 in adult 09/19/2016  . Vitamin D deficiency 09/19/2016  . Urinary frequency 02/14/2016  . Back pain 10/18/2015  . Type 2 diabetes mellitus with hyperglycemia, without long-term current use of insulin (HCC) 07/10/2015  . Hormone replacement therapy (postmenopausal) 10/04/2014  . Mitral regurgitation 11/05/2012  . Aortic valve disease 10/21/2011  . Menorrhagia, premenopausal 09/01/2011  . Depression 03/04/2010  . GOUT 12/16/2006  . ANEMIA, IRON DEFICIENCY NOS 12/16/2006  . TOBACCO ABUSE 12/16/2006  . Essential hypertension 12/16/2006    Current Outpatient Medications  Medication Sig Dispense Refill  . amLODipine (NORVASC) 10 MG tablet Take 1 tablet (10 mg total) by mouth daily. 90 tablet 3  . atorvastatin (LIPITOR) 10 MG tablet Take 1 tablet (10 mg total) by mouth daily. 90 tablet 3  . brimonidine-timolol (COMBIGAN) 0.2-0.5 % ophthalmic solution Place 1 drop into both eyes every 12 (twelve) hours.    . busPIRone (BUSPAR) 10 MG tablet Take 1 tablet (10 mg total) by mouth 3 (three) times daily. 90 tablet 2  . glimepiride (AMARYL) 2 MG tablet Take 1 tablet (2 mg total) by mouth daily with breakfast. Overdue for appt must see provider for future refills 90 tablet 1  . glucose blood (ACCU-CHEK AVIVA) test strip Use to check blood sugars 1-3 times daily. Dx Code: E11.9 300 each 1  . hydrOXYzine (ATARAX/VISTARIL) 25 MG tablet Take 1 tablet (25 mg total) by mouth every 8 (eight) hours as needed for itching.  120 tablet 2  . Lancets (ACCU-CHEK SOFT TOUCH) lancets Use as instructed 100 each 12  . losartan-hydrochlorothiazide (HYZAAR) 100-12.5 MG tablet Take 1 tablet by mouth daily. 90 tablet 3  . metFORMIN (GLUCOPHAGE) 500 MG tablet Take 1 tablet (500 mg total) by mouth 2 (two) times daily with a meal. 180 tablet 1  . methocarbamol (ROBAXIN) 500 MG tablet TAKE ONE TABLET BY MOUTH EVERY 8 HOURS AS NEEDED FOR MUSCLE SPASMS 90 tablet 3  . Misc. Devices (CANE) MISC Use as needed for left knee pain. 1 each 0  . QUEtiapine (SEROQUEL) 300 MG tablet Take 1 tablet (300 mg total) by mouth at bedtime as needed (sleep). 30 tablet 2  . venlafaxine (EFFEXOR) 75 MG tablet Take 1 tablet (75 mg total) by mouth 3 (three) times daily with meals. 90 tablet 2  . naproxen (NAPROSYN) 375 MG tablet Take 1 tablet (375 mg total) by mouth 2 (two) times daily as needed for moderate pain. 180 tablet 0   Current Facility-Administered Medications  Medication Dose Route Frequency Provider Last Rate Last Admin  . cyanocobalamin ((VITAMIN B-12)) injection 1,000 mcg  1,000 mcg Intramuscular Q30 days Olive Bass, FNP   1,000 mcg at 06/21/20 1011    Allergies: Patient has no known allergies.  Past Medical History:  Diagnosis Date  . Anemia, iron deficiency   . Arthritis   . Depression   . Diabetes mellitus without complication (HCC)   . Dysmenorrhea   . Gout   . Heart valve problem   .  Hypertension   . Menorrhagia   . Mitral regurgitation   . Pneumonia     Past Surgical History:  Procedure Laterality Date  . KNEE ARTHROSCOPY     Left    Family History  Problem Relation Age of Onset  . Hypertension Mother   . Stroke Paternal Grandmother   . Heart disease Sister   . Heart disease Brother     Social History   Tobacco Use  . Smoking status: Current Every Day Smoker    Packs/day: 0.50    Years: 7.00    Pack years: 3.50    Types: Cigarettes  . Smokeless tobacco: Never Used  Substance Use Topics  .  Alcohol use: No    Subjective:   Patient notes that she fell about 2 weeks ago and landed on left knee; also complaining of chronic left hip pain- feels she may have re-aggravated her hip during the fall; asking for refill on her Naproxen; Also complaining of sensation of "numbness and tingling" radiating into her right arm- initially responded to B12 treatment but got a shot 2 weeks ago and symptoms still present.   Would like to get her B12 and Vitamin D level today as well;   Objective:  Vitals:   07/13/20 1308  BP: 122/80  Pulse: 86  Temp: 98.7 F (37.1 C)  TempSrc: Oral  SpO2: 98%  Weight: 213 lb 12.8 oz (97 kg)  Height: 5\' 7"  (1.702 m)    General: Well developed, well nourished, in no acute distress  Skin : Warm and dry.  Head: Normocephalic and atraumatic  Lungs: Respirations unlabored;  Musculoskeletal: No deformities; no active joint inflammation  Extremities: No edema, cyanosis, clubbing  Vessels: Symmetric bilaterally  Neurologic: Alert and oriented; speech intact; face symmetrical; moves all extremities well; CNII-XII intact without focal deficit   Assessment:  1. Chronic pain of left knee   2. Chronic midline low back pain, unspecified whether sciatica present   3. Numbness and tingling of right arm   4. B12 deficiency   5. Vitamin D deficiency   6. Type 2 diabetes mellitus with hyperglycemia, without long-term current use of insulin ([place])     Plan:  1. Update X-ray of left knee; 2. Refill given on Naproxen; keep planned follow-up with her back specialist for epidural steroid injection; 3. Check cervical X-ray; may need to see neurology; 4. Check B12 level; 5. Check Vitamin D level; 6. Check Hgba1c;  This visit occurred during the SARS-CoV-2 public health emergency.  Safety protocols were in place, including screening questions prior to the visit, additional usage of staff PPE, and extensive cleaning of exam room while observing appropriate contact time as  indicated for disinfecting solutions.     No follow-ups on file.  Orders Placed This Encounter  Procedures  . DG Cervical Spine 2 or 3 views    Standing Status:   Future    Number of Occurrences:   1    Standing Expiration Date:   07/13/2021    Order Specific Question:   Reason for Exam (SYMPTOM  OR DIAGNOSIS REQUIRED)    Answer:   right arm numbness/ tingling    Order Specific Question:   Is patient pregnant?    Answer:   No    Order Specific Question:   Preferred imaging location?    Answer:   [facility]  . DG Knee Complete 4 Views Left    Standing Status:   Future    Number of  Occurrences:   1    Standing Expiration Date:   07/13/2021    Order Specific Question:   Reason for Exam (SYMPTOM  OR DIAGNOSIS REQUIRED)    Answer:   left knee pain    Order Specific Question:   Is patient pregnant?    Answer:   No    Order Specific Question:   Preferred imaging location?    Answer:   [facility]  . DG Hip Unilat W OR W/O Pelvis 2-3 Views Left    Standing Status:   Future    Number of Occurrences:   1    Standing Expiration Date:   07/13/2021    Order Specific Question:   Reason for Exam (SYMPTOM  OR DIAGNOSIS REQUIRED)    Answer:   left hip pain    Order Specific Question:   Is patient pregnant?    Answer:   No    Order Specific Question:   Preferred imaging location?    Answer:   [facility]  . Vitamin B12    Standing Status:   Future    Number of Occurrences:   1    Standing Expiration Date:   07/13/2021  . Vitamin D (25 hydroxy)    Standing Status:   Future    Number of Occurrences:   1    Standing Expiration Date:   07/13/2021  . Hemoglobin A1c    Standing Status:   Future    Number of Occurrences:   1    Standing Expiration Date:   07/13/2021    Requested Prescriptions   Signed Prescriptions Disp Refills  . naproxen (NAPROSYN) 375 MG tablet 180 tablet 0    Sig: Take 1 tablet (375 mg total) by mouth 2 (two) times daily as needed for moderate pain.

## 2020-07-16 ENCOUNTER — Other Ambulatory Visit: Payer: Self-pay | Admitting: Family

## 2020-07-16 DIAGNOSIS — M25562 Pain in left knee: Secondary | ICD-10-CM

## 2020-07-16 DIAGNOSIS — R200 Anesthesia of skin: Secondary | ICD-10-CM

## 2020-07-16 DIAGNOSIS — R202 Paresthesia of skin: Secondary | ICD-10-CM

## 2020-07-16 DIAGNOSIS — G8929 Other chronic pain: Secondary | ICD-10-CM

## 2020-07-17 ENCOUNTER — Encounter: Payer: Self-pay | Admitting: Neurology

## 2020-07-23 ENCOUNTER — Ambulatory Visit

## 2020-07-25 ENCOUNTER — Ambulatory Visit (INDEPENDENT_AMBULATORY_CARE_PROVIDER_SITE_OTHER): Admitting: Orthopaedic Surgery

## 2020-07-25 VITALS — Ht 67.0 in | Wt 215.0 lb

## 2020-07-25 DIAGNOSIS — M25562 Pain in left knee: Secondary | ICD-10-CM | POA: Diagnosis not present

## 2020-07-25 DIAGNOSIS — M7062 Trochanteric bursitis, left hip: Secondary | ICD-10-CM | POA: Diagnosis not present

## 2020-07-25 DIAGNOSIS — G8929 Other chronic pain: Secondary | ICD-10-CM | POA: Diagnosis not present

## 2020-07-25 MED ORDER — LIDOCAINE HCL 1 % IJ SOLN
3.0000 mL | INTRAMUSCULAR | Status: AC | PRN
  Administered 2020-07-25: 3 mL

## 2020-07-25 MED ORDER — METHYLPREDNISOLONE ACETATE 40 MG/ML IJ SUSP
40.0000 mg | INTRAMUSCULAR | Status: AC | PRN
  Administered 2020-07-25: 40 mg via INTRA_ARTICULAR

## 2020-07-25 NOTE — Progress Notes (Signed)
Office Visit Note   Patient: Stephanie Joseph           Date of Birth: 23-Oct-1960           MRN: [id number] Visit Date: 07/25/2020              Requested by: Olive Bass, FNP [address] [place],  [place] [address] PCP: Olive Bass, FNP   Assessment & Plan: Visit Diagnoses:  1. Trochanteric bursitis, left hip   2. Chronic pain of left knee     Plan: I did recommend stretching exercises for her left hip area and showed her how to do these and she demonstrated them back.  I did recommend a steroid injection over the left hip area where she is more painful than the left knee and she agreed to this and tolerated well.  She will watch her blood glucose closely knowing that this can elevate her blood glucose.  I have also recommended Voltaren gel for both her left hip and the medial aspect of her left knee.  I did recommend outpatient physical therapy but she wants to try this regimen first before therapy.  I will see her back in 6 weeks to see how she is doing overall.  All questions and concerns were answered and addressed.  Follow-Up Instructions: Return in about 6 weeks (around 09/05/2020).   Orders:  Orders Placed This Encounter  Procedures  . Large Joint Inj   No orders of the defined types were placed in this encounter.     Procedures: Large Joint Inj: L greater trochanter on 07/25/2020 10:04 AM Indications: pain and diagnostic evaluation Details: 22 G 1.5 in needle, lateral approach  Arthrogram: No  Medications: 3 mL lidocaine 1 %; 40 mg methylPREDNISolone acetate 40 MG/ML Outcome: tolerated well, no immediate complications Procedure, treatment alternatives, risks and benefits explained, specific risks discussed. Consent was given by the patient. Immediately prior to procedure a time out was called to verify the correct patient, procedure, equipment, support staff and site/side marked as required. Patient was prepped and draped in the usual  sterile fashion.       Clinical Data: No additional findings.   Subjective: Chief Complaint  Patient presents with  . Left Hip - Pain  . Left Knee - Pain  The patient is a very pleasant 60 year old female who comes in for evaluation treatment of left hip pain and left knee pain.  She points to the trochanteric area of her left hip and the IT band as well as the medial side of her left knee.  She denies any injuries and has never had surgery on these areas.  She is a diabetic that used to be under poor control but now her hemoglobin A1c is in the low 6 range.  She denies any injuries.  She hurts when she bears weight on the left knee and occasionally it swells.  She denies any locking catching.  She reports left hip pain at night when she lays on the hip.  She denies any groin pain.  She denies any other acute change in her medical status since she is worked very hard to get her blood glucose under much better control.  There are x-rays on the canopy system for me to review of her pelvis, her left hip and her left knee.  HPI  Review of Systems She currently denies any headache, chest pain, shortness of breath, fever, chills, nausea, vomiting  Objective: Vital Signs: Ht 5'  7" (1.702 m)   Wt 215 lb (97.5 kg)   LMP 02/13/2014 (Within Days)   BMI 33.67 kg/m   Physical Exam She is alert and orient x3 and in no acute distress Ortho Exam On exam she is obese.  Both knees have slight valgus malalignment but only slight.  Is more of a neutral when she stands.  Her left knee is ligamentously stable with no effusion.  There is some medial joint line tenderness.  Her left hip has fluid and full range of motion with no pain in the groin.  There is significant pain over the left hip trochanteric area and IT band. Specialty Comments:  No specialty comments available.  Imaging: No results found. An AP pelvis and lateral left hip shows no acute findings and a well-maintained joint space.  2 views  of the left knee show well-maintained lateral compartment and the medial compartment is only just slightly narrowed.  There is no acute findings and no effusion.  PMFS History: Patient Active Problem List   Diagnosis Date Noted  . Chronic midline low back pain 07/12/2018  . Acute pharyngitis 09/16/2017  . Abnormal TSH 09/16/2017  . Glaucoma 05/21/2017  . Tooth pain 04/28/2017  . Encounter for general adult medical examination with abnormal findings 09/19/2016  . Class 1 obesity due to excess calories with serious comorbidity and body mass index (BMI) of 33.0 to 33.9 in adult 09/19/2016  . Vitamin D deficiency 09/19/2016  . Urinary frequency 02/14/2016  . Back pain 10/18/2015  . Type 2 diabetes mellitus with hyperglycemia, without long-term current use of insulin (HCC) 07/10/2015  . Hormone replacement therapy (postmenopausal) 10/04/2014  . Mitral regurgitation 11/05/2012  . Aortic valve disease 10/21/2011  . Menorrhagia, premenopausal 09/01/2011  . Depression 03/04/2010  . GOUT 12/16/2006  . ANEMIA, IRON DEFICIENCY NOS 12/16/2006  . TOBACCO ABUSE 12/16/2006  . Essential hypertension 12/16/2006   Past Medical History:  Diagnosis Date  . Anemia, iron deficiency   . Arthritis   . Depression   . Diabetes mellitus without complication (HCC)   . Dysmenorrhea   . Gout   . Heart valve problem   . Hypertension   . Menorrhagia   . Mitral regurgitation   . Pneumonia     Family History  Problem Relation Age of Onset  . Hypertension Mother   . Stroke Paternal Grandmother   . Heart disease Sister   . Heart disease Brother     Past Surgical History:  Procedure Laterality Date  . KNEE ARTHROSCOPY     Left   Social History   Occupational History  . Occupation: Disability Pending  Tobacco Use  . Smoking status: Current Every Day Smoker    Packs/day: 0.50    Years: 7.00    Pack years: 3.50    Types: Cigarettes  . Smokeless tobacco: Never Used  Vaping Use  . Vaping Use:  Never used  Substance and Sexual Activity  . Alcohol use: No  . Drug use: No    Types: "Crack" cocaine  . Sexual activity: Yes    Birth control/protection: None

## 2020-07-26 ENCOUNTER — Other Ambulatory Visit: Payer: Self-pay

## 2020-07-26 ENCOUNTER — Ambulatory Visit (INDEPENDENT_AMBULATORY_CARE_PROVIDER_SITE_OTHER)

## 2020-07-26 DIAGNOSIS — E538 Deficiency of other specified B group vitamins: Secondary | ICD-10-CM | POA: Diagnosis not present

## 2020-07-26 NOTE — Progress Notes (Signed)
Pt here for monthly B12 injection per Ria Clock, FNP.  B12 given IM left deltoidf and pt tolerated injection well.  Pt to schedule next B12 injection upon check out.  Dr Jonny Ruiz: can you please cosign since PCP is out of office?  Thanks

## 2020-07-27 ENCOUNTER — Ambulatory Visit

## 2020-09-03 ENCOUNTER — Ambulatory Visit: Admitting: Neurology

## 2020-09-05 ENCOUNTER — Encounter: Payer: Self-pay | Admitting: Orthopaedic Surgery

## 2020-09-05 ENCOUNTER — Ambulatory Visit (INDEPENDENT_AMBULATORY_CARE_PROVIDER_SITE_OTHER): Admitting: Orthopaedic Surgery

## 2020-09-05 DIAGNOSIS — M7062 Trochanteric bursitis, left hip: Secondary | ICD-10-CM

## 2020-09-05 NOTE — Progress Notes (Signed)
HPI: Ms. Kuehnle returns today follow-up of her left hip.  States the trochanteric injection on 07/25/2020 helped about 50%.  She has been doing some stretching.  She is having no numbness tingling down the leg.  This pain lateral aspect of the hip whenever she lies on it.  No groin pain.  Review of systems: See HPI otherwise negative.  Physical exam: General: Well-developed well-nourished female ambulates without any assistive device.  Gets on and off the examining table easily.  Left hip: Good range of motion without pain.  Tenderness over the left trochanteric region.  Impression: Left hip trochanteric bursitis   Plan: Discussed with her sending her formal therapy she defers.  Therefore she shown additional hip stretching exercises.  I had her demonstrate these back to me.  She will continue work on stretching.  I told her least wait 3 months between injections.  We could see her back in April and do a repeat injection at that time if she continues to have pain.  Otherwise follow-up as needed.  Questions were encouraged

## 2020-09-06 ENCOUNTER — Ambulatory Visit (HOSPITAL_COMMUNITY): Admitting: Licensed Clinical Social Worker

## 2020-09-12 ENCOUNTER — Encounter (HOSPITAL_COMMUNITY): Admitting: Psychiatry

## 2020-09-14 ENCOUNTER — Encounter (HOSPITAL_COMMUNITY): Payer: Self-pay | Admitting: Psychiatry

## 2020-09-14 ENCOUNTER — Other Ambulatory Visit: Payer: Self-pay

## 2020-09-14 ENCOUNTER — Telehealth (INDEPENDENT_AMBULATORY_CARE_PROVIDER_SITE_OTHER): Admitting: Psychiatry

## 2020-09-14 DIAGNOSIS — F330 Major depressive disorder, recurrent, mild: Secondary | ICD-10-CM

## 2020-09-14 DIAGNOSIS — F411 Generalized anxiety disorder: Secondary | ICD-10-CM | POA: Diagnosis not present

## 2020-09-14 MED ORDER — BUSPIRONE HCL 10 MG PO TABS: 10.0000 mg | ORAL_TABLET | Freq: Three times a day (TID) | ORAL | 2 refills | Status: DC

## 2020-09-14 MED ORDER — VENLAFAXINE HCL 75 MG PO TABS: 75.0000 mg | ORAL_TABLET | Freq: Three times a day (TID) | ORAL | 2 refills | Status: DC

## 2020-09-14 MED ORDER — QUETIAPINE FUMARATE 300 MG PO TABS: 300.0000 mg | ORAL_TABLET | Freq: Every evening | ORAL | 2 refills | Status: DC | PRN

## 2020-09-14 MED ORDER — HYDROXYZINE HCL 25 MG PO TABS: 25.0000 mg | ORAL_TABLET | Freq: Three times a day (TID) | ORAL | 2 refills | Status: DC | PRN

## 2020-09-14 NOTE — Progress Notes (Signed)
BH MD/PA/NP OP Progress Note Virtual Visit via Video Note  I connected with Stephanie Joseph on 09/14/20 at  8:00 AM EST by a video enabled telemedicine application and verified that I am speaking with the correct person using two identifiers.  Location: Patient: Home Provider: Clinic   I discussed the limitations of evaluation and management by telemedicine and the availability of in person appointments. The patient expressed understanding and agreed to proceed.  I provided 30 minutes of non-face-to-face time during this encounter.    09/14/2020 8:22 AM Stephanie Joseph  MRN:  [id number]  Chief Complaint:  "I like the Buspar"   HPI: 60 year old female seen today for follow up psychiatric evaluation.   She has a psychiatric history of depression, anxiety, alochol use (in remission), cannabis use disorder (in remission), and cocaine use disorder (in remission).  She is managed on hydroxyzine 25 mg every 8 hours as needed, Seroquel 300 nightly, Buspar 10 mg three times daily, and Effexor 75 mg three times daily. She informed Clinical research associate that her medications are effective in managing her psychiatric conditions.   Today she is well groomed, pleasant, cooperative, engaged in conversation, and maintained eye contact. She informed provided that since her last visit things have been going well. Today provider conducted a GAD-7 and patient scored a  2, at ger last visit she scored a14.  Provider also conducted a PHQ-9 and patient scored a 6, at her last visit she scored a 15.  Today she denies SI/HI/VAH or paranoia. She endorses adequate sleep and appetite.  Patient notes at times she is restless because she has pain in her hip that radiates down her left side. She notes that she has followed up with her PCP and orthopedic doctors to manage this pain. She notes that cortisone injections work somewhat in managing her pain.   No medication changes made today. Patient will continue  medications as prescribes. No other concerns noted at this time.  Visit Diagnosis:    ICD-10-CM   1. Generalized anxiety disorder  F41.1 busPIRone (BUSPAR) 10 MG tablet    hydrOXYzine (ATARAX/VISTARIL) 25 MG tablet  2. Mild episode of recurrent major depressive disorder (HCC)  F33.0 busPIRone (BUSPAR) 10 MG tablet    QUEtiapine (SEROQUEL) 300 MG tablet    venlafaxine (EFFEXOR) 75 MG tablet    Past Psychiatric History:depression, anxiety, alochol use (in remission), cannabis use disorder (in remission), and cocaine use disorder (in remission) Past Medical History:  Past Medical History:  Diagnosis Date  . Anemia, iron deficiency   . Arthritis   . Depression   . Diabetes mellitus without complication (HCC)   . Dysmenorrhea   . Gout   . Heart valve problem   . Hypertension   . Menorrhagia   . Mitral regurgitation   . Pneumonia     Past Surgical History:  Procedure Laterality Date  . KNEE ARTHROSCOPY     Left    Family Psychiatric History: Brother schizophrenia (deceased)  Family History:  Family History  Problem Relation Age of Onset  . Hypertension Mother   . Stroke Paternal Grandmother   . Heart disease Sister   . Heart disease Brother     Social History:  Social History   Socioeconomic History  . Marital status: Married    Spouse name: Not on file  . Number of children: 1  . Years of education: 31  . Highest education level: Not on file  Occupational History  . Occupation: Disability Pending  Tobacco Use  . Smoking status: Current Every Day Smoker    Packs/day: 0.50    Years: 7.00    Pack years: 3.50    Types: Cigarettes  . Smokeless tobacco: Never Used  Vaping Use  . Vaping Use: Never used  Substance and Sexual Activity  . Alcohol use: No  . Drug use: No    Types: "Crack" cocaine  . Sexual activity: Yes    Birth control/protection: None  Other Topics Concern  . Not on file  Social History Narrative   Fun: Go to church   Denies abuse and  feels safe at home.    Social Determinants of Health   Financial Resource Strain: Not on file  Food Insecurity: Not on file  Transportation Needs: Not on file  Physical Activity: Not on file  Stress: Not on file  Social Connections: Not on file    Allergies: No Known Allergies  Metabolic Disorder Labs: Lab Results  Component Value Date   HGBA1C 6.5 07/13/2020   MPG 197 01/27/2020   No results found for: PROLACTIN Lab Results  Component Value Date   CHOL 114 01/27/2020   TRIG 60 01/27/2020   HDL 69 01/27/2020   CHOLHDL 1.7 01/27/2020   VLDL 94.5 11/19/2018   LDLCALC 31 01/27/2020   LDLCALC 28 11/19/2018   Lab Results  Component Value Date   TSH 0.59 05/10/2019   TSH 0.55 09/16/2017    Therapeutic Level Labs: No results found for: LITHIUM No results found for: VALPROATE No components found for:  CBMZ  Current Medications: Current Outpatient Medications  Medication Sig Dispense Refill  . amLODipine (NORVASC) 10 MG tablet Take 1 tablet (10 mg total) by mouth daily. 90 tablet 3  . atorvastatin (LIPITOR) 10 MG tablet Take 1 tablet (10 mg total) by mouth daily. 90 tablet 3  . brimonidine-timolol (COMBIGAN) 0.2-0.5 % ophthalmic solution Place 1 drop into both eyes every 12 (twelve) hours.    . busPIRone (BUSPAR) 10 MG tablet Take 1 tablet (10 mg total) by mouth 3 (three) times daily. 90 tablet 2  . glimepiride (AMARYL) 2 MG tablet Take 1 tablet (2 mg total) by mouth daily with breakfast. Overdue for appt must see provider for future refills 90 tablet 1  . glucose blood (ACCU-CHEK AVIVA) test strip Use to check blood sugars 1-3 times daily. Dx Code: E11.9 300 each 1  . hydrOXYzine (ATARAX/VISTARIL) 25 MG tablet Take 1 tablet (25 mg total) by mouth every 8 (eight) hours as needed for itching. 120 tablet 2  . Lancets (ACCU-CHEK SOFT TOUCH) lancets Use as instructed 100 each 12  . losartan-hydrochlorothiazide (HYZAAR) 100-12.5 MG tablet Take 1 tablet by mouth daily. 90 tablet  3  . metFORMIN (GLUCOPHAGE) 500 MG tablet Take 1 tablet (500 mg total) by mouth 2 (two) times daily with a meal. 180 tablet 1  . methocarbamol (ROBAXIN) 500 MG tablet TAKE ONE TABLET BY MOUTH EVERY 8 HOURS AS NEEDED FOR MUSCLE SPASMS 90 tablet 3  . Misc. Devices (CANE) MISC Use as needed for left knee pain. 1 each 0  . naproxen (NAPROSYN) 375 MG tablet Take 1 tablet (375 mg total) by mouth 2 (two) times daily as needed for moderate pain. 180 tablet 0  . QUEtiapine (SEROQUEL) 300 MG tablet Take 1 tablet (300 mg total) by mouth at bedtime as needed (sleep). 30 tablet 2  . venlafaxine (EFFEXOR) 75 MG tablet Take 1 tablet (75 mg total) by mouth 3 (three) times daily with meals.  90 tablet 2   Current Facility-Administered Medications  Medication Dose Route Frequency Provider Last Rate Last Admin  . cyanocobalamin ((VITAMIN B-12)) injection 1,000 mcg  1,000 mcg Intramuscular Q30 days Olive Bass, FNP   1,000 mcg at 07/26/20 1422     Musculoskeletal: Strength & Muscle Tone: Unable to assess due to telehealth visit. Gait & Station: Unable to assess due to telehealth visit.  Patient leans: N/A  Psychiatric Specialty Exam: Review of Systems  Last menstrual period 02/13/2014.There is no height or weight on file to calculate BMI.  General Appearance: Well Groomed  Eye Contact:  Good  Speech:  Clear and Coherent and Normal Rate  Volume:  Normal  Mood:  Anxious and Depressed  Affect:  Congruent  Thought Process:  Coherent, Goal Directed and Linear  Orientation:  Full (Time, Place, and Person)  Thought Content: WDL and Logical   Suicidal Thoughts:  No  Homicidal Thoughts:  No  Memory:  Immediate;   Good Recent;   Good Remote;   Good  Judgement:  Good  Insight:  Good  Psychomotor Activity:  Normal  Concentration:  Concentration: Good and Attention Span: Good  Recall:  Good  Fund of Knowledge: Good  Language: Good  Akathisia:  No  Handed:  Right  AIMS (if indicated): not done   Assets:  Communication Skills Desire for Improvement Financial Resources/Insurance Housing Social Support  ADL's:  Intact  Cognition: WNL  Sleep:  Good   Screenings: GAD-7   Flowsheet Row Video Visit from 09/14/2020 in [facility] Clinical Support from 06/14/2020 in [facility] Office Visit from 04/27/2019 in [facility]  Total GAD-7 Score 2 14 10     PHQ2-9   Flowsheet Row Video Visit from 09/14/2020 in [facility] Clinical Support from 06/14/2020 in [facility] Office Visit from 01/27/2020 in [facility] Office Visit from 03/05/2018 in [facility]  PHQ-2 Total Score 0 2 0 2  PHQ-9 Total Score 6 15 0 --    Flowsheet Row Video Visit from 09/14/2020 in [facility]  C-SSRS RISK CATEGORY No Risk       Assessment and Plan:  Patient reports that she is doing well on her current medication regimen.  No medication changes made today. Patient will continue medications as prescribes. No other concerns noted at this time.  1. Generalized anxiety disorder  Continue- hydrOXYzine (ATARAX/VISTARIL) 25 MG tablet; Take 1 tablet (25 mg total) by mouth every 8 (eight) hours as needed for itching.  Dispense: 120 tablet; Refill: 2 Continue- busPIRone (BUSPAR) 10 MG tablet; Take 1 tablet (10 mg total) by mouth 3 (three) times daily.  Dispense: 90 tablet; Refill: 2   2. Mild episode of recurrent major depressive disorder (HCC)  Continue- venlafaxine (EFFEXOR) 75 MG tablet; Take 1 tablet (75 mg total) by mouth 3 (three) times daily with meals.  Dispense: 90 tablet; Refill: 2 Continue- QUEtiapine (SEROQUEL) 300 MG tablet; Take 1 tablet (300 mg total) by mouth at bedtime as needed (sleep).  Dispense: 30 tablet; Refill: 2 Continue- busPIRone (BUSPAR) 10 MG tablet; Take 1 tablet (10 mg total)  by mouth 3 (three) times daily.  Dispense: 90 tablet; Refill: 2  Follow-up in 3 month Follow up with therapy  [facility], NP 09/14/2020, 8:22 AM

## 2020-09-17 ENCOUNTER — Telehealth: Payer: Self-pay | Admitting: Neurology

## 2020-09-17 NOTE — Telephone Encounter (Signed)
Pt called needing to speak to the RN regarding her cpap paperwork from the [place]. She is wanting to know the status/update on this from they were to fill out. Please advise.

## 2020-09-17 NOTE — Telephone Encounter (Signed)
Per telephone note from Gypsy Balsam, RN in November order was sent to [facility]. I have asked Shanda Bumps at [facility] what the status is on the order.

## 2020-09-24 ENCOUNTER — Other Ambulatory Visit: Payer: Self-pay

## 2020-09-24 ENCOUNTER — Encounter: Payer: Self-pay | Admitting: Family

## 2020-09-24 ENCOUNTER — Ambulatory Visit (INDEPENDENT_AMBULATORY_CARE_PROVIDER_SITE_OTHER): Admitting: Family

## 2020-09-24 ENCOUNTER — Telehealth: Payer: Self-pay | Admitting: Family

## 2020-09-24 VITALS — BP 138/80 | HR 89 | Temp 98.9°F | Ht 67.0 in | Wt 211.6 lb

## 2020-09-24 DIAGNOSIS — E119 Type 2 diabetes mellitus without complications: Secondary | ICD-10-CM

## 2020-09-24 DIAGNOSIS — K047 Periapical abscess without sinus: Secondary | ICD-10-CM

## 2020-09-24 MED ORDER — AMOXICILLIN-POT CLAVULANATE 875-125 MG PO TABS: 1.0000 | ORAL_TABLET | Freq: Two times a day (BID) | ORAL | 0 refills | Status: AC

## 2020-09-24 MED ORDER — GLIMEPIRIDE 2 MG PO TABS: 2.0000 mg | ORAL_TABLET | Freq: Every day | ORAL | 1 refills | Status: DC

## 2020-09-24 NOTE — Progress Notes (Signed)
Stephanie Joseph is a 60 y.o. female with the following history as recorded in [employer]:  Patient Active Problem List   Diagnosis Date Noted  . Chronic midline low back pain 07/12/2018  . Acute pharyngitis 09/16/2017  . Abnormal TSH 09/16/2017  . Glaucoma 05/21/2017  . Tooth pain 04/28/2017  . Encounter for general adult medical examination with abnormal findings 09/19/2016  . Class 1 obesity due to excess calories with serious comorbidity and body mass index (BMI) of 33.0 to 33.9 in adult 09/19/2016  . Vitamin D deficiency 09/19/2016  . Urinary frequency 02/14/2016  . Back pain 10/18/2015  . Type 2 diabetes mellitus with hyperglycemia, without long-term current use of insulin (HCC) 07/10/2015  . Hormone replacement therapy (postmenopausal) 10/04/2014  . Mitral regurgitation 11/05/2012  . Aortic valve disease 10/21/2011  . Menorrhagia, premenopausal 09/01/2011  . Depression 03/04/2010  . GOUT 12/16/2006  . ANEMIA, IRON DEFICIENCY NOS 12/16/2006  . TOBACCO ABUSE 12/16/2006  . Essential hypertension 12/16/2006    Current Outpatient Medications  Medication Sig Dispense Refill  . amLODipine (NORVASC) 10 MG tablet Take 1 tablet (10 mg total) by mouth daily. 90 tablet 3  . amoxicillin-clavulanate (AUGMENTIN) 875-125 MG tablet Take 1 tablet by mouth 2 (two) times daily for 10 days. 20 tablet 0  . atorvastatin (LIPITOR) 10 MG tablet Take 1 tablet (10 mg total) by mouth daily. 90 tablet 3  . brimonidine-timolol (COMBIGAN) 0.2-0.5 % ophthalmic solution Place 1 drop into both eyes every 12 (twelve) hours.    . busPIRone (BUSPAR) 10 MG tablet Take 1 tablet (10 mg total) by mouth 3 (three) times daily. 90 tablet 2  . glucose blood (ACCU-CHEK AVIVA) test strip Use to check blood sugars 1-3 times daily. Dx Code: E11.9 300 each 1  . hydrOXYzine (ATARAX/VISTARIL) 25 MG tablet Take 1 tablet (25 mg total) by mouth every 8 (eight) hours as needed for itching. 120 tablet 2  . Lancets  (ACCU-CHEK SOFT TOUCH) lancets Use as instructed 100 each 12  . losartan-hydrochlorothiazide (HYZAAR) 100-12.5 MG tablet Take 1 tablet by mouth daily. 90 tablet 3  . metFORMIN (GLUCOPHAGE) 500 MG tablet Take 1 tablet (500 mg total) by mouth 2 (two) times daily with a meal. 180 tablet 1  . methocarbamol (ROBAXIN) 500 MG tablet TAKE ONE TABLET BY MOUTH EVERY 8 HOURS AS NEEDED FOR MUSCLE SPASMS 90 tablet 3  . Misc. Devices (CANE) MISC Use as needed for left knee pain. 1 each 0  . naproxen (NAPROSYN) 375 MG tablet Take 1 tablet (375 mg total) by mouth 2 (two) times daily as needed for moderate pain. 180 tablet 0  . QUEtiapine (SEROQUEL) 300 MG tablet Take 1 tablet (300 mg total) by mouth at bedtime as needed (sleep). 30 tablet 2  . venlafaxine (EFFEXOR) 75 MG tablet Take 1 tablet (75 mg total) by mouth 3 (three) times daily with meals. 90 tablet 2  . glimepiride (AMARYL) 2 MG tablet Take 1 tablet (2 mg total) by mouth daily with breakfast. 90 tablet 1   Current Facility-Administered Medications  Medication Dose Route Frequency Provider Last Rate Last Admin  . cyanocobalamin ((VITAMIN B-12)) injection 1,000 mcg  1,000 mcg Intramuscular Q30 days Olive Bass, FNP   1,000 mcg at 07/26/20 1422    Allergies: Patient has no known allergies.  Past Medical History:  Diagnosis Date  . Anemia, iron deficiency   . Arthritis   . Depression   . Diabetes mellitus without complication (HCC)   . Dysmenorrhea   .  Gout   . Heart valve problem   . Hypertension   . Menorrhagia   . Mitral regurgitation   . Pneumonia     Past Surgical History:  Procedure Laterality Date  . KNEE ARTHROSCOPY     Left    Family History  Problem Relation Age of Onset  . Hypertension Mother   . Stroke Paternal Grandmother   . Heart disease Sister   . Heart disease Brother     Social History   Tobacco Use  . Smoking status: Current Every Day Smoker    Packs/day: 0.50    Years: 7.00    Pack years: 3.50     Types: Cigarettes  . Smokeless tobacco: Never Used  Substance Use Topics  . Alcohol use: No    Subjective:   Concern for dental infection- unable to see her dentist until next week; pain/ swelling over lower left jaw; requesting antibiotic in the interim;  Also notes she is due for refill on Glimepiride; last Hgba1c in January 2022 was at 6.5;    Objective:  Vitals:   09/24/20 1349  BP: 138/80  Pulse: 89  Temp: 98.9 F (37.2 C)  TempSrc: Oral  SpO2: 99%  Weight: 211 lb 9.6 oz (96 kg)  Height: 5\' 7"  (1.702 m)    General: Well developed, well nourished, in no acute distress  Skin : Warm and dry.  Head: Normocephalic and atraumatic  Lungs: Respirations unlabored;  Neurologic: Alert and oriented; speech intact; face symmetrical; moves all extremities well; CNII-XII intact without focal deficit   Assessment:  1. Dental infection   2. Type 2 diabetes mellitus without complication, without long-term current use of insulin (HCC)     Plan:  1. Rx for Augmentin 875 mg bid x 10 days; keep planned follow-up with dentist; 2. Refill updated; labs for diabetes due in July 2022; reviewed most recent Hgba1c;  This visit occurred during the SARS-CoV-2 public health emergency.  Safety protocols were in place, including screening questions prior to the visit, additional usage of staff PPE, and extensive cleaning of exam room while observing appropriate contact time as indicated for disinfecting solutions.     Return in about 4 months (around 01/24/2021) for Type 2 Diabetes follow-up/ Dr. 01/26/2021 or Dr. Yetta Barre for continued care at [facility] .  No orders of the defined types were placed in this encounter.   Requested Prescriptions   Signed Prescriptions Disp Refills  . glimepiride (AMARYL) 2 MG tablet 90 tablet 1    Sig: Take 1 tablet (2 mg total) by mouth daily with breakfast.  . amoxicillin-clavulanate (AUGMENTIN) 875-125 MG tablet 20 tablet 0    Sig: Take 1 tablet by mouth 2 (two)  times daily for 10 days.

## 2020-09-24 NOTE — Telephone Encounter (Signed)
Patient saw at the bottom of her AVS that she still had 4 doses of b12 remaining so she was wondering if you still wanted her to be doing them

## 2020-09-25 NOTE — Telephone Encounter (Signed)
Attempted to call pt back. No answer. I have left a VM to call back to discuss the Vit B12.

## 2020-09-25 NOTE — Telephone Encounter (Signed)
I have called pt back and informed her that according to the last labs 2 months ago her Vit B12 looked like they are normal range and she can decide if she wants to continue the Vit B12 or not.  She stated that she is going to hold off for now and she wanted to make sure that [facility] is still going to have her records. I stated that they will and she said okay.

## 2020-09-27 ENCOUNTER — Ambulatory Visit (HOSPITAL_COMMUNITY): Payer: Self-pay | Admitting: Licensed Clinical Social Worker

## 2020-10-02 NOTE — Progress Notes (Signed)
Patient ID: Stephanie Joseph, female   DOB: 07/16/1960, 60 y.o.   MRN: [id number]      60 y.o. followed for murmur since 2015. Last echo done 12/17/17 showed mild AR/MR with EF 50-55% CRF;s Include DM, HLD and HTN.   Husband doing better not running off and using crack  No cardiac symptoms   She is not working has back issues BS/BP up a bit due to being on prednisone  Has daughter at home who works at [employer]  09/24/20 Rx with Augmentin for dental infection   DM not well controlled A1c 7.3 07/13/20   Sees Psychiatry for Anxiety/Depression and drug use in remission She is on Seroquel, Buspar, Effexor and hydroxyzine   Has had left hip injections for pain and trochanteric bursitis   No cardiac complaints Needs to take drops for glaucoma causing some headaches   ROS: Denies fever, malais, weight loss, blurry vision, decreased visual acuity, cough, sputum, SOB, hemoptysis, pleuritic pain, palpitaitons, heartburn, abdominal pain, melena, lower extremity edema, claudication, or rash.  All other systems reviewed and negative  General: BP 110/62   Pulse 74   Ht 5\' 7"  (1.702 m)   Wt 96.6 kg   LMP 02/13/2014 (Within Days)   SpO2 98%   BMI 33.36 kg/m  Affect appropriate Overweight black female  HEENT: normal Neck supple with no adenopathy JVP normal no bruits no thyromegaly Lungs clear with no wheezing and good diaphragmatic motion Heart:  S1/S2 AR murmur , no rub, gallop or click PMI normal Abdomen: benighn, BS positve, no tenderness, no AAA no bruit.  No HSM or HJR Distal pulses intact with no bruits No edema Neuro non-focal Skin warm and dry No muscular weakness    Current Outpatient Medications  Medication Sig Dispense Refill  . amLODipine (NORVASC) 10 MG tablet Take 1 tablet (10 mg total) by mouth daily. 90 tablet 3  . atorvastatin (LIPITOR) 10 MG tablet Take 1 tablet (10 mg total) by mouth daily. 90 tablet 3  . brimonidine-timolol (COMBIGAN) 0.2-0.5 %  ophthalmic solution Place 1 drop into both eyes every 12 (twelve) hours.    . busPIRone (BUSPAR) 10 MG tablet Take 1 tablet (10 mg total) by mouth 3 (three) times daily. 90 tablet 2  . glimepiride (AMARYL) 2 MG tablet Take 1 tablet (2 mg total) by mouth daily with breakfast. 90 tablet 1  . glucose blood (ACCU-CHEK AVIVA) test strip Use to check blood sugars 1-3 times daily. Dx Code: E11.9 300 each 1  . hydrOXYzine (ATARAX/VISTARIL) 25 MG tablet Take 1 tablet (25 mg total) by mouth every 8 (eight) hours as needed for itching. 120 tablet 2  . Lancets (ACCU-CHEK SOFT TOUCH) lancets Use as instructed 100 each 12  . losartan-hydrochlorothiazide (HYZAAR) 100-12.5 MG tablet Take 1 tablet by mouth daily. 90 tablet 3  . metFORMIN (GLUCOPHAGE) 500 MG tablet Take 1 tablet (500 mg total) by mouth 2 (two) times daily with a meal. 180 tablet 1  . methocarbamol (ROBAXIN) 500 MG tablet TAKE ONE TABLET BY MOUTH EVERY 8 HOURS AS NEEDED FOR MUSCLE SPASMS 90 tablet 3  . Misc. Devices (CANE) MISC Use as needed for left knee pain. 1 each 0  . naproxen (NAPROSYN) 375 MG tablet Take 1 tablet (375 mg total) by mouth 2 (two) times daily as needed for moderate pain. 180 tablet 0  . QUEtiapine (SEROQUEL) 300 MG tablet Take 1 tablet (300 mg total) by mouth at bedtime as needed (sleep). 30 tablet 2  .  venlafaxine (EFFEXOR) 75 MG tablet Take 1 tablet (75 mg total) by mouth 3 (three) times daily with meals. 90 tablet 2   Current Facility-Administered Medications  Medication Dose Route Frequency Provider Last Rate Last Admin  . cyanocobalamin ((VITAMIN B-12)) injection 1,000 mcg  1,000 mcg Intramuscular Q30 days Olive Bass, FNP   1,000 mcg at 07/26/20 1422    Allergies  Patient has no known allergies.  Electrocardiogram:  09/01/19 SR rate 66 nonspecific ST changes   Assessment and Plan  MR /AR no change in murmur   TTE 12/17/17 mild AR and MR no need for SBE will update echo since its been 3 years     Bipolar/Anxiety/Depression:  Stable f/u psychiatry on multiple meds   YE:[id number] low carb diet.  Target hemoglobin A1c is 6.5 or less.  Continue current medications.  HTN: Well controlled.  Continue current medications and low sodium Dash type diet.    HLD:  Continue statin labs with primary   Ortho: left trochanteric bursitis post injection   F/u with me in a year  Echo for MR/AR    Charlton Haws

## 2020-10-09 ENCOUNTER — Encounter: Payer: Self-pay | Admitting: Cardiovascular Disease

## 2020-10-09 ENCOUNTER — Other Ambulatory Visit: Payer: Self-pay | Admitting: Family

## 2020-10-09 ENCOUNTER — Ambulatory Visit (INDEPENDENT_AMBULATORY_CARE_PROVIDER_SITE_OTHER): Admitting: Cardiovascular Disease

## 2020-10-09 ENCOUNTER — Telehealth: Payer: Self-pay | Admitting: Family

## 2020-10-09 ENCOUNTER — Other Ambulatory Visit: Payer: Self-pay

## 2020-10-09 VITALS — BP 110/62 | HR 74 | Ht 67.0 in | Wt 213.0 lb

## 2020-10-09 DIAGNOSIS — I340 Nonrheumatic mitral (valve) insufficiency: Secondary | ICD-10-CM | POA: Diagnosis not present

## 2020-10-09 DIAGNOSIS — I351 Nonrheumatic aortic (valve) insufficiency: Secondary | ICD-10-CM

## 2020-10-09 MED ORDER — FLUCONAZOLE 150 MG PO TABS: ORAL_TABLET | ORAL | 0 refills | Status: DC

## 2020-10-09 NOTE — Patient Instructions (Addendum)
Medication Instructions:  *If you need a refill on your cardiac medications before your next appointment, please call your pharmacy*  Lab Work: If you have labs (blood work) drawn today and your tests are completely normal, you will receive your results only by: Marland Kitchen MyChart Message (if you have MyChart) OR . A paper copy in the mail If you have any lab test that is abnormal or we need to change your treatment, we will call you to review the results.  Testing/Procedures: Your physician has requested that you have an echocardiogram. Echocardiography is a painless test that uses sound waves to create images of your heart. It provides your doctor with information about the size and shape of your heart and how well your heart's chambers and valves are working. This procedure takes approximately one hour. There are no restrictions for this procedure.  Follow-Up: At [facility], you and your health needs are our priority.  As part of our continuing mission to provide you with exceptional heart care, we have created designated Provider Care Teams.  These Care Teams include your primary Cardiologist (physician) and Advanced Practice Providers (APPs -  Physician Assistants and Nurse Practitioners) who all work together to provide you with the care you need, when you need it.  We recommend signing up for the patient portal called "MyChart".  Sign up information is provided on this After Visit Summary.  MyChart is used to connect with patients for Virtual Visits (Telemedicine).  Patients are able to view lab/test results, encounter notes, upcoming appointments, etc.  Non-urgent messages can be sent to your provider as well.   To learn more about what you can do with MyChart, go to [email or web address].    Your next appointment:   12 month(s)  The format for your next appointment:   In Person  Provider:   You may see Dr. Eden Emms or one of the following Advanced Practice Providers on your designated  Care Team:    Georgie Chard, NP

## 2020-10-09 NOTE — Telephone Encounter (Signed)
I have spoken to the provider and she stated that she is willing to send in another medication to the pharmacy. Left message for pt stating that information.

## 2020-10-09 NOTE — Telephone Encounter (Signed)
Patient called and said that the antibiotic that she is on gave her a yeast infection. She was wondering if something could be called in to [facility] [address] [place], [place] - [address] Please advise

## 2020-10-10 ENCOUNTER — Ambulatory Visit (HOSPITAL_COMMUNITY): Admitting: Licensed Clinical Social Worker

## 2020-10-30 ENCOUNTER — Ambulatory Visit (INDEPENDENT_AMBULATORY_CARE_PROVIDER_SITE_OTHER): Admitting: Licensed Clinical Social Worker

## 2020-10-30 ENCOUNTER — Other Ambulatory Visit: Payer: Self-pay

## 2020-10-30 DIAGNOSIS — F418 Other specified anxiety disorders: Secondary | ICD-10-CM

## 2020-10-31 ENCOUNTER — Telehealth: Payer: Self-pay | Admitting: Family

## 2020-10-31 ENCOUNTER — Other Ambulatory Visit: Payer: Self-pay | Admitting: Family

## 2020-10-31 NOTE — Telephone Encounter (Signed)
Patient is requesting to be a transfer of care. [place]. Please advise

## 2020-10-31 NOTE — Telephone Encounter (Signed)
Okay with me if they want to see me.  Thanks.

## 2020-11-01 NOTE — Progress Notes (Signed)
   THERAPIST PROGRESS NOTE  Session Time: 40 min  Participation Level: Active  Behavioral Response: CasualAlertAnxious and Depressed  Type of Therapy: Individual Therapy  Treatment Goals addressed: Communication: dep/anx/coping  Interventions: Other: Additional assessment  Summary: Stephanie Joseph is a 60 y.o. female who presents with hx of dep/anx. This date pt comes for in person session. She has not been seen since initial eval 07/11/20. Pt is noted to have cancelled appts on 3/3, 3/24 and 4/6. LCSW addressed scheduling and attending appts with pt. Pt states she intends to come but it all depends on what other appts there are not only for self but for spouse. Pt depends on transportation from spouse or dtr. Asked pt to prioritize her care in order to make any progress. Remainder of session spent reassessing where pt is with overall status. She states her dep and anx are both "2" on a 0-10 scale with 10 the worst. She is taking meds as prescribed. Pt advises she and dtr, only child, who lives with her are doing very well with ea other. Assessment of marital status reveals ongoing issues. Pt married since 2016 but has been with [place] since 2001. Pt states she is frustrated with his recent escalation of drinking and he does continue to be jealous and controlling. For example he will not allow pt to use the bus. Pt denies physical violence when pt drinks but says he starts cursing and yelling, which both upset her. She states they have no intimacy. Pt continues to be tied to her church and wants Genevie Cheshire to be in church with her. She advises he used to be in the choir, be an usher, Catering manager and now he won't go at all. Pt reports Billy watches [employer] "all day long". Additional exploration of relationship reveals Genevie Cheshire recently lost a brother suddenly to an MI. Pt confirms his escalated drinking started after death of brother when asked. LCSW provided education on grief/loss. LCSW provided grief  literature for pt to read herself and then provide to spouse. Role played how she might be able to approach topic as pt unsure. Info provided on grief counseling ctr at hospice. LCSW reviewed poc and again encouraged pt to participate in care. Pt states she will keep up with appts and states appreciation.    Suicidal/Homicidal: Nowithout intent/plan  Therapist Response: Pt says she is going to be more engaged in care.  Plan: Return again for next avail appt.  Diagnosis: Axis I: Depression with anxiety  Pinckard Sink, LCSW 11/01/2020

## 2020-11-12 ENCOUNTER — Other Ambulatory Visit: Payer: Self-pay

## 2020-11-12 ENCOUNTER — Ambulatory Visit (HOSPITAL_COMMUNITY): Attending: Cardiology

## 2020-11-12 DIAGNOSIS — I340 Nonrheumatic mitral (valve) insufficiency: Secondary | ICD-10-CM | POA: Insufficient documentation

## 2020-11-12 DIAGNOSIS — I351 Nonrheumatic aortic (valve) insufficiency: Secondary | ICD-10-CM | POA: Insufficient documentation

## 2020-11-12 LAB — ECHOCARDIOGRAM COMPLETE
Area-P 1/2: 4.15 cm2
MV M vel: 5.4 m/s
MV Peak grad: 116.4 mmHg
P 1/2 time: 492 msec
S' Lateral: 3.7 cm

## 2020-11-29 ENCOUNTER — Encounter: Admitting: Emergency Medicine

## 2020-12-11 ENCOUNTER — Ambulatory Visit (INDEPENDENT_AMBULATORY_CARE_PROVIDER_SITE_OTHER): Admitting: Psychiatry

## 2020-12-11 ENCOUNTER — Encounter (HOSPITAL_COMMUNITY): Payer: Self-pay | Admitting: Psychiatry

## 2020-12-11 ENCOUNTER — Other Ambulatory Visit: Payer: Self-pay

## 2020-12-11 DIAGNOSIS — F330 Major depressive disorder, recurrent, mild: Secondary | ICD-10-CM | POA: Diagnosis not present

## 2020-12-11 DIAGNOSIS — F411 Generalized anxiety disorder: Secondary | ICD-10-CM | POA: Diagnosis not present

## 2020-12-11 MED ORDER — QUETIAPINE FUMARATE 300 MG PO TABS: 300.0000 mg | ORAL_TABLET | Freq: Every evening | ORAL | 2 refills | Status: DC | PRN

## 2020-12-11 MED ORDER — BUSPIRONE HCL 10 MG PO TABS: 10.0000 mg | ORAL_TABLET | Freq: Three times a day (TID) | ORAL | 2 refills | Status: DC

## 2020-12-11 MED ORDER — VENLAFAXINE HCL 75 MG PO TABS: 75.0000 mg | ORAL_TABLET | Freq: Three times a day (TID) | ORAL | 2 refills | Status: DC

## 2020-12-11 MED ORDER — HYDROXYZINE HCL 25 MG PO TABS: 25.0000 mg | ORAL_TABLET | Freq: Three times a day (TID) | ORAL | 2 refills | Status: DC | PRN

## 2020-12-11 NOTE — Progress Notes (Signed)
BH MD/PA/NP OP Progress Note    12/11/2020 10:51 AM Stephanie Joseph  MRN:  [id number]  Chief Complaint:  "I'm doing well"  Chief Complaint    Medication Management     HPI: 60 year old female seen today for follow up psychiatric evaluation.   She has a psychiatric history of depression, anxiety, alochol use (in remission), cannabis use disorder (in remission), and cocaine use disorder (in remission).  She is managed on hydroxyzine 25 mg every 8 hours as needed, Seroquel 300 nightly, Buspar 10 mg three times daily, and Effexor 75 mg three times daily. She informed Clinical research associatewriter that her medications are effective in managing her psychiatric conditions.   Today she is well groomed, pleasant, cooperative, engaged in conversation, and maintained eye contact. She reports that she has been doing well since her last visit.  She notes that she has been spending her time sitting with an old friend who she believes may have dementia.  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  Patient informed writer that her medications are effective in managing her psychiatric conditions and notes that she has minimal anxiety and depression.  Today provider conducted a GAD-7 and patient scored a 1, at her last visit she scored 2.  Provider also conducted a PHQ-9 and patient scored a 3, at her last visit she scored a 6.  Patient notes that her sleep is disturbed because of frequent trips to the restroom and adjustments of her sleep apnea machine.  She also notes that her appetite fluctuates because she has new dentures and reports that things do not taste the same anymore.  She denies weight loss.  Today she denies SI/HI/VAH, mania, or paranoia.    Patient notes that today she has been having pain in her shoulder.  She notes that she is in pain most days.  She informed Clinical research associatewriter that she plans to follow-up with her PCP in a month.  No medication changes made today.  Patient will continue medications as prescribes. No other concerns noted at this time.  Visit Diagnosis:    ICD-10-CM   1. 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 Anemia, iron deficiency   . Arthritis   . Depression   . Diabetes mellitus without complication (HCC)   . Dysmenorrhea   . Gout   . Heart valve problem   . Hypertension   . Menorrhagia   . Mitral regurgitation   . Pneumonia     Past Surgical History:  Procedure Laterality Date  . KNEE ARTHROSCOPY     Left    Family Psychiatric History: Brother schizophrenia (deceased)  Family History:  Family History  Problem Relation Age of Onset  . Hypertension Mother   . Stroke Paternal Grandmother   . Heart disease Sister   . Heart disease Brother     Social History:  Social History   Socioeconomic History  . Marital status: Married    Spouse name: Not on file  . Number of children:  1  . Years of education: 42  . Highest education level: Not on file  Occupational History  . Occupation: Disability Pending  Tobacco Use  . Smoking status: Current Every Day Smoker    Packs/day: 0.50    Years: 7.00    Pack years: 3.50    Types: Cigarettes  . Smokeless tobacco: Never Used  Vaping Use  . Vaping Use: Never used  Substance and Sexual Activity  . Alcohol use: No  . Drug use: No    Types: "Crack" cocaine  . Sexual activity: Yes    Birth control/protection: None  Other Topics Concern  . Not on file  Social History Narrative   Fun: Go to church    Denies abuse and feels safe at home.    Social Determinants of Health   Financial Resource Strain: Not on file  Food Insecurity: Not on file  Transportation Needs: Not on file  Physical Activity: Not on file  Stress: Not on file  Social Connections: Not on file    Allergies: No Known Allergies  Metabolic Disorder Labs: Lab Results  Component Value Date   HGBA1C 6.5 07/13/2020   MPG 197 01/27/2020   No results found for: PROLACTIN Lab Results  Component Value Date   CHOL 114 01/27/2020   TRIG 60 01/27/2020   HDL 69 01/27/2020   CHOLHDL 1.7 01/27/2020   VLDL 33.8 11/19/2018   LDLCALC 31 01/27/2020   LDLCALC 28 11/19/2018   Lab Results  Component Value Date   TSH 0.59 05/10/2019   TSH 0.55 09/16/2017    Therapeutic Level Labs: No results found for: LITHIUM No results found for: VALPROATE No components found for:  CBMZ  Current Medications: Current Outpatient Medications  Medication Sig Dispense Refill  . amLODipine (NORVASC) 10 MG tablet Take 1 tablet (10 mg total) by mouth daily. 90 tablet 3  . atorvastatin (LIPITOR) 10 MG tablet Take 1 tablet (10 mg total) by mouth daily. 90 tablet 3  . brimonidine-timolol (COMBIGAN) 0.2-0.5 % ophthalmic solution Place 1 drop into both eyes every 12 (twelve) hours.    . fluconazole (DIFLUCAN) 150 MG tablet 1 tablet po qd as directed; repeat after 72 hours 2 tablet 0  . glimepiride (AMARYL) 2 MG tablet Take 1 tablet (2 mg total) by mouth daily with breakfast. 90 tablet 1  . glucose blood (ACCU-CHEK AVIVA) test strip Use to check blood sugars 1-3 times daily. Dx Code: E11.9 300 each 1  . Lancets (ACCU-CHEK SOFT TOUCH) lancets Use as instructed 100 each 12  . losartan-hydrochlorothiazide (HYZAAR) 100-12.5 MG tablet Take 1 tablet by mouth daily. 90 tablet 3  . metFORMIN (GLUCOPHAGE) 500 MG tablet TAKE 1 TABLET BY MOUTH TWICE DAILY WITH A MEAL 180 tablet 0  . methocarbamol (ROBAXIN) 500 MG tablet TAKE ONE TABLET BY MOUTH EVERY 8 HOURS  AS NEEDED FOR MUSCLE SPASMS 90 tablet 3  . Misc. Devices (CANE) MISC Use as needed for left knee pain. 1 each 0  . naproxen (NAPROSYN) 375 MG tablet Take 1 tablet (375 mg total) by mouth 2 (two) times daily as needed for moderate pain. 180 tablet 0  . busPIRone (BUSPAR) 10 MG tablet Take 1 tablet (10 mg total) by mouth 3 (three) times daily. 90 tablet 2  . hydrOXYzine (ATARAX/VISTARIL) 25 MG tablet Take 1 tablet (25 mg total) by mouth every 8 (eight) hours as needed for itching. 120 tablet 2  . QUEtiapine (SEROQUEL) 300 MG tablet Take 1 tablet (300 mg total) by  mouth at bedtime as needed (sleep). 30 tablet 2  . venlafaxine (EFFEXOR) 75 MG tablet Take 1 tablet (75 mg total) by mouth 3 (three) times daily with meals. 90 tablet 2   Current Facility-Administered Medications  Medication Dose Route Frequency Provider Last Rate Last Admin  . cyanocobalamin ((VITAMIN B-12)) injection 1,000 mcg  1,000 mcg Intramuscular Q30 days Olive Bass, FNP   1,000 mcg at 07/26/20 1422     Musculoskeletal: Strength & Muscle Tone: within normal limits Gait & Station: normal Patient leans: N/A  Psychiatric Specialty Exam: Review of Systems  Blood pressure 126/75, pulse 91, height 5\' 7"  (1.702 m), weight 211 lb (95.7 kg), last menstrual period 02/13/2014.Body mass index is 33.05 kg/m.  General Appearance: Well Groomed  Eye Contact:  Good  Speech:  Clear and Coherent and Normal Rate  Volume:  Normal  Mood:  Euthymic  Affect:  Congruent  Thought Process:  Coherent, Goal Directed and Linear  Orientation:  Full (Time, Place, and Person)  Thought Content: WDL and Logical   Suicidal Thoughts:  No  Homicidal Thoughts:  No  Memory:  Immediate;   Good Recent;   Good Remote;   Good  Judgement:  Good  Insight:  Good  Psychomotor Activity:  Normal  Concentration:  Concentration: Good and Attention Span: Good  Recall:  Good  Fund of Knowledge: Good  Language: Good  Akathisia:  No  Handed:  Right   AIMS (if indicated): not done  Assets:  Communication Skills Desire for Improvement Financial Resources/Insurance Housing Social Support  ADL's:  Intact  Cognition: WNL  Sleep:  Fair   Screenings: GAD-7   Flowsheet Row Clinical Support from 12/11/2020 in [facility] Video Visit from 09/14/2020 in [facility] Clinical Support from 06/14/2020 in [facility] Office Visit from 04/27/2019 in [facility]  Total GAD-7 Score 1 2 14 10     PHQ2-9   Flowsheet Row Clinical Support from 12/11/2020 in [facility] Video Visit from 09/14/2020 in [facility] Clinical Support from 06/14/2020 in [facility] Office Visit from 01/27/2020 in [facility] Office Visit from 03/05/2018 in [facility]  PHQ-2 Total Score 1 0 2 0 2  PHQ-9 Total Score 3 6 15  0 --    Flowsheet Row Video Visit from 09/14/2020 in [facility]  C-SSRS RISK CATEGORY No Risk       Assessment and Plan:  Patient reports that she is doing well on her current medication regimen. No medication changes made today. Patient will continue medications as prescribes. No other concerns noted at this time.  1. Generalized anxiety disorder  Continue- hydrOXYzine (ATARAX/VISTARIL) 25 MG tablet; Take 1 tablet (25 mg total) by mouth every 8 (eight) hours as needed for itching.  Dispense: 120 tablet; Refill: 2 Continue- busPIRone (BUSPAR) 10 MG tablet; Take 1 tablet (10 mg total) by mouth 3 (three) times daily.  Dispense: 90 tablet; Refill: 2   2. Mild episode of recurrent major depressive disorder (HCC)  Continue- venlafaxine (EFFEXOR) 75 MG tablet; Take 1 tablet (75 mg total) by mouth 3 (three) times daily with meals.  Dispense: 90 tablet; Refill: 2 Continue- QUEtiapine (SEROQUEL)  300 MG tablet; Take 1 tablet (300 mg total) by mouth at bedtime as needed (sleep).  Dispense: 30 tablet; Refill: 2 Continue- busPIRone (BUSPAR) 10 MG tablet; Take  1 tablet (10 mg total) by mouth 3 (three) times daily.  Dispense: 90 tablet; Refill: 2  Follow-up in 3 month Follow up with therapy  Shanna Cisco, NP 12/11/2020, 10:51 AM

## 2020-12-20 ENCOUNTER — Ambulatory Visit (HOSPITAL_COMMUNITY): Admitting: Licensed Clinical Social Worker

## 2021-01-01 ENCOUNTER — Encounter: Payer: Self-pay | Admitting: Emergency Medicine

## 2021-01-01 ENCOUNTER — Ambulatory Visit (INDEPENDENT_AMBULATORY_CARE_PROVIDER_SITE_OTHER): Admitting: Emergency Medicine

## 2021-01-01 ENCOUNTER — Other Ambulatory Visit: Payer: Self-pay

## 2021-01-01 VITALS — BP 130/80 | HR 93 | Temp 98.8°F | Ht 67.0 in | Wt 214.0 lb

## 2021-01-01 DIAGNOSIS — E1169 Type 2 diabetes mellitus with other specified complication: Secondary | ICD-10-CM

## 2021-01-01 DIAGNOSIS — Z7689 Persons encountering health services in other specified circumstances: Secondary | ICD-10-CM | POA: Diagnosis not present

## 2021-01-01 DIAGNOSIS — F419 Anxiety disorder, unspecified: Secondary | ICD-10-CM

## 2021-01-01 DIAGNOSIS — I152 Hypertension secondary to endocrine disorders: Secondary | ICD-10-CM

## 2021-01-01 DIAGNOSIS — E1159 Type 2 diabetes mellitus with other circulatory complications: Secondary | ICD-10-CM | POA: Diagnosis not present

## 2021-01-01 DIAGNOSIS — E1165 Type 2 diabetes mellitus with hyperglycemia: Secondary | ICD-10-CM | POA: Diagnosis not present

## 2021-01-01 DIAGNOSIS — I38 Endocarditis, valve unspecified: Secondary | ICD-10-CM

## 2021-01-01 DIAGNOSIS — M545 Low back pain, unspecified: Secondary | ICD-10-CM

## 2021-01-01 DIAGNOSIS — E785 Hyperlipidemia, unspecified: Secondary | ICD-10-CM

## 2021-01-01 DIAGNOSIS — F32A Depression, unspecified: Secondary | ICD-10-CM

## 2021-01-01 DIAGNOSIS — Z720 Tobacco use: Secondary | ICD-10-CM

## 2021-01-01 DIAGNOSIS — G8929 Other chronic pain: Secondary | ICD-10-CM

## 2021-01-01 LAB — POCT GLYCOSYLATED HEMOGLOBIN (HGB A1C): Hemoglobin A1C: 6.3 % — AB (ref 4.0–5.6)

## 2021-01-01 NOTE — Assessment & Plan Note (Signed)
Well-controlled hypertension.  Continue amlodipine 10 mg daily and Hyzaar 100-12.5 mg daily. Well-controlled diabetes with hemoglobin A1c of 6.3.  Continue metformin 500 mg twice a day and glimepiride 2 mg daily. Diet and nutrition discussed. Follow-up in 6 months.

## 2021-01-01 NOTE — Assessment & Plan Note (Signed)
Sees psychiatrist on a regular basis.  Presently on BuSpar, Seroquel, and Effexor. Stable.

## 2021-01-01 NOTE — Assessment & Plan Note (Signed)
Stable.  Recent echocardiogram results reviewed.  Sees cardiologist on a regular basis.

## 2021-01-01 NOTE — Assessment & Plan Note (Signed)
Diet and nutrition discussed.  Continue atorvastatin 10 mg daily. 

## 2021-01-01 NOTE — Assessment & Plan Note (Signed)
Stable and asymptomatic.  Sees orthopedist on a regular basis. Advised to see the orthopedist also for chronic right shoulder pain.  May benefit from physical therapy.

## 2021-01-01 NOTE — Patient Instructions (Signed)
Health Maintenance, Female Adopting a healthy lifestyle and getting preventive care are important in promoting health and wellness. Ask your health care provider about: The right schedule for you to have regular tests and exams. Things you can do on your own to prevent diseases and keep yourself healthy. What should I know about diet, weight, and exercise? Eat a healthy diet  Eat a diet that includes plenty of vegetables, fruits, low-fat dairy products, and lean protein. Do not eat a lot of foods that are high in solid fats, added sugars, or sodium.  Maintain a healthy weight Body mass index (BMI) is used to identify weight problems. It estimates body fat based on height and weight. Your health care provider can help determineyour BMI and help you achieve or maintain a healthy weight. Get regular exercise Get regular exercise. This is one of the most important things you can do for your health. Most adults should: Exercise for at least 150 minutes each week. The exercise should increase your heart rate and make you sweat (moderate-intensity exercise). Do strengthening exercises at least twice a week. This is in addition to the moderate-intensity exercise. Spend less time sitting. Even light physical activity can be beneficial. Watch cholesterol and blood lipids Have your blood tested for lipids and cholesterol at 60 years of age, then havethis test every 5 years. Have your cholesterol levels checked more often if: Your lipid or cholesterol levels are high. You are older than 60 years of age. You are at high risk for heart disease. What should I know about cancer screening? Depending on your health history and family history, you may need to have cancer screening at various ages. This may include screening for: Breast cancer. Cervical cancer. Colorectal cancer. Skin cancer. Lung cancer. What should I know about heart disease, diabetes, and high blood pressure? Blood pressure and heart  disease High blood pressure causes heart disease and increases the risk of stroke. This is more likely to develop in people who have high blood pressure readings, are of African descent, or are overweight. Have your blood pressure checked: Every 3-5 years if you are 18-39 years of age. Every year if you are 40 years old or older. Diabetes Have regular diabetes screenings. This checks your fasting blood sugar level. Have the screening done: Once every three years after age 40 if you are at a normal weight and have a low risk for diabetes. More often and at a younger age if you are overweight or have a high risk for diabetes. What should I know about preventing infection? Hepatitis B If you have a higher risk for hepatitis B, you should be screened for this virus. Talk with your health care provider to find out if you are at risk forhepatitis B infection. Hepatitis C Testing is recommended for: Everyone born from 1945 through 1965. Anyone with known risk factors for hepatitis C. Sexually transmitted infections (STIs) Get screened for STIs, including gonorrhea and chlamydia, if: You are sexually active and are younger than 60 years of age. You are older than 60 years of age and your health care provider tells you that you are at risk for this type of infection. Your sexual activity has changed since you were last screened, and you are at increased risk for chlamydia or gonorrhea. Ask your health care provider if you are at risk. Ask your health care provider about whether you are at high risk for HIV. Your health care provider may recommend a prescription medicine to help   prevent HIV infection. If you choose to take medicine to prevent HIV, you should first get tested for HIV. You should then be tested every 3 months for as long as you are taking the medicine. Pregnancy If you are about to stop having your period (premenopausal) and you may become pregnant, seek counseling before you get  pregnant. Take 400 to 800 micrograms (mcg) of folic acid every day if you become pregnant. Ask for birth control (contraception) if you want to prevent pregnancy. Osteoporosis and menopause Osteoporosis is a disease in which the bones lose minerals and strength with aging. This can result in bone fractures. If you are 65 years old or older, or if you are at risk for osteoporosis and fractures, ask your health care provider if you should: Be screened for bone loss. Take a calcium or vitamin D supplement to lower your risk of fractures. Be given hormone replacement therapy (HRT) to treat symptoms of menopause. Follow these instructions at home: Lifestyle Do not use any products that contain nicotine or tobacco, such as cigarettes, e-cigarettes, and chewing tobacco. If you need help quitting, ask your health care provider. Do not use street drugs. Do not share needles. Ask your health care provider for help if you need support or information about quitting drugs. Alcohol use Do not drink alcohol if: Your health care provider tells you not to drink. You are pregnant, may be pregnant, or are planning to become pregnant. If you drink alcohol: Limit how much you use to 0-1 drink a day. Limit intake if you are breastfeeding. Be aware of how much alcohol is in your drink. In the [place] one drink equals one 12 oz bottle of beer (355 mL), one 5 oz glass of wine (148 mL), or one 1 oz glass of hard liquor (44 mL). General instructions Schedule regular health, dental, and eye exams. Stay current with your vaccines. Tell your health care provider if: You often feel depressed. You have ever been abused or do not feel safe at home. Summary Adopting a healthy lifestyle and getting preventive care are important in promoting health and wellness. Follow your health care provider's instructions about healthy diet, exercising, and getting tested or screened for diseases. Follow your health care provider's  instructions on monitoring your cholesterol and blood pressure. This information is not intended to replace advice given to you by your health care provider. Make sure you discuss any questions you have with your healthcare provider. Document Revised: 06/16/2018 Document Reviewed: 06/16/2018 Elsevier Patient Education  2022 [employer]  

## 2021-01-01 NOTE — Progress Notes (Signed)
Stephanie Joseph 60 y.o.   Chief Complaint  Patient presents with   Transitions Of Care   Diabetes    HISTORY OF PRESENT ILLNESS: This is a 60 y.o. female here to establish care with me.  Used to see Allyne Gee, NP. Has history of hypertension, diabetes, chronic back pain, "leaky valve", chronic depression and anxiety. Smoker. History of chronic depression and anxiety.  Sees psychiatrist on a regular basis. Has no complaints or medical concerns today. Sees cardiologist on a regular basis.  Diabetes Pertinent negatives for hypoglycemia include no dizziness or headaches. Pertinent negatives for diabetes include no chest pain.    Prior to Admission medications   Medication Sig Start Date End Date Taking? Authorizing Provider  amLODipine (NORVASC) 10 MG tablet Take 1 tablet (10 mg total) by mouth daily. 01/27/20  Yes Olive Bass, FNP  atorvastatin (LIPITOR) 10 MG tablet Take 1 tablet (10 mg total) by mouth daily. 01/27/20  Yes Olive Bass, FNP  brimonidine-timolol (COMBIGAN) 0.2-0.5 % ophthalmic solution Place 1 drop into both eyes every 12 (twelve) hours.   Yes [provider]  busPIRone (BUSPAR) 10 MG tablet Take 1 tablet (10 mg total) by mouth 3 (three) times daily. 12/11/20  Yes Toy Cookey E, NP  fluconazole (DIFLUCAN) 150 MG tablet 1 tablet po qd as directed; repeat after 72 hours 10/09/20  Yes Olive Bass, FNP  glimepiride (AMARYL) 2 MG tablet Take 1 tablet (2 mg total) by mouth daily with breakfast. 09/24/20  Yes Olive Bass, FNP  glucose blood (ACCU-CHEK AVIVA) test strip Use to check blood sugars 1-3 times daily. Dx Code: E11.9 01/20/18  Yes Olive Bass, FNP  hydrOXYzine (ATARAX/VISTARIL) 25 MG tablet Take 1 tablet (25 mg total) by mouth every 8 (eight) hours as needed for itching. 12/11/20  Yes Toy Cookey E, NP  Lancets (ACCU-CHEK SOFT TOUCH) lancets Use as instructed 05/21/17  Yes Shambley,  Audie Box, NP  losartan-hydrochlorothiazide (HYZAAR) 100-12.5 MG tablet Take 1 tablet by mouth daily. 01/27/20  Yes Olive Bass, FNP  metFORMIN (GLUCOPHAGE) 500 MG tablet TAKE 1 TABLET BY MOUTH TWICE DAILY WITH A MEAL 10/31/20  Yes Olive Bass, FNP  methocarbamol (ROBAXIN) 500 MG tablet TAKE ONE TABLET BY MOUTH EVERY 8 HOURS AS NEEDED FOR MUSCLE SPASMS 01/27/20  Yes Olive Bass, FNP  naproxen (NAPROSYN) 375 MG tablet Take 1 tablet (375 mg total) by mouth 2 (two) times daily as needed for moderate pain. 07/13/20  Yes Olive Bass, FNP  QUEtiapine (SEROQUEL) 300 MG tablet Take 1 tablet (300 mg total) by mouth at bedtime as needed (sleep). 12/11/20  Yes Toy Cookey E, NP  venlafaxine (EFFEXOR) 75 MG tablet Take 1 tablet (75 mg total) by mouth 3 (three) times daily with meals. 12/11/20  Yes Shanna Cisco, NP  Misc. Devices (CANE) MISC Use as needed for left knee pain. 01/29/18   Evaristo Bury, NP    No Known Allergies  Patient Active Problem List   Diagnosis Date Noted   Chronic midline low back pain 07/12/2018   Glaucoma 05/21/2017   Class 1 obesity due to excess calories with serious comorbidity and body mass index (BMI) of 33.0 to 33.9 in adult 09/19/2016   Vitamin D deficiency 09/19/2016   Type 2 diabetes mellitus with hyperglycemia, without long-term current use of insulin (HCC) 07/10/2015   Hormone replacement therapy (postmenopausal) 10/04/2014   Mitral regurgitation 11/05/2012   Aortic valve disease 10/21/2011   Menorrhagia,  premenopausal 09/01/2011   Depression 03/04/2010   GOUT 12/16/2006   ANEMIA, IRON DEFICIENCY NOS 12/16/2006   TOBACCO ABUSE 12/16/2006   Essential hypertension 12/16/2006    Past Medical History:  Diagnosis Date   Anemia, iron deficiency    Arthritis    Depression    Diabetes mellitus without complication (HCC)    Dysmenorrhea    Gout    Heart valve problem    Hypertension    Menorrhagia    Mitral  regurgitation    Pneumonia     Past Surgical History:  Procedure Laterality Date   KNEE ARTHROSCOPY     Left    Social History   Socioeconomic History   Marital status: Married    Spouse name: Not on file   Number of children: 1   Years of education: 12   Highest education level: Not on file  Occupational History   Occupation: Disability Pending  Tobacco Use   Smoking status: Every Day    Packs/day: 0.50    Years: 7.00    Pack years: 3.50    Types: Cigarettes   Smokeless tobacco: Never  Vaping Use   Vaping Use: Never used  Substance and Sexual Activity   Alcohol use: No   Drug use: No    Types: "Crack" cocaine   Sexual activity: Yes    Birth control/protection: None  Other Topics Concern   Not on file  Social History Narrative   Fun: Go to church   Denies abuse and feels safe at home.    Social Determinants of Health   Financial Resource Strain: Not on file  Food Insecurity: Not on file  Transportation Needs: Not on file  Physical Activity: Not on file  Stress: Not on file  Social Connections: Not on file  Intimate Partner Violence: Not on file    Family History  Problem Relation Age of Onset   Hypertension Mother    Stroke Paternal Grandmother    Heart disease Sister    Heart disease Brother      Review of Systems  Constitutional: Negative.  Negative for chills and fever.  HENT: Negative.  Negative for congestion and sore throat.   Respiratory: Negative.  Negative for cough and shortness of breath.   Cardiovascular: Negative.  Negative for chest pain and palpitations.  Gastrointestinal:  Negative for abdominal pain, blood in stool, diarrhea, melena, nausea and vomiting.  Genitourinary: Negative.  Negative for dysuria and hematuria.  Musculoskeletal:  Positive for back pain and joint pain (Right shoulder pain). Negative for myalgias and neck pain.  Skin: Negative.  Negative for rash.  Neurological: Negative.  Negative for dizziness and headaches.   All other systems reviewed and are negative. Today's Vitals   01/01/21 1042  BP: 130/80  Pulse: 93  Temp: 98.8 F (37.1 C)  TempSrc: Oral  SpO2: 97%  Weight: 214 lb (97.1 kg)  Height: 5\' 7"  (1.702 m)   Body mass index is 33.52 kg/m.   Physical Exam Vitals reviewed.  Constitutional:      Appearance: Normal appearance.  HENT:     Head: Normocephalic.  Eyes:     Extraocular Movements: Extraocular movements intact.     Conjunctiva/sclera: Conjunctivae normal.     Pupils: Pupils are equal, round, and reactive to light.  Cardiovascular:     Rate and Rhythm: Normal rate and regular rhythm.     Pulses: Normal pulses.     Heart sounds: Murmur heard.  Pulmonary:     Effort:  Pulmonary effort is normal.     Breath sounds: Normal breath sounds.  Musculoskeletal:     Cervical back: Normal range of motion and neck supple.     Comments: Right shoulder: Full range of motion.  No tenderness or swelling.  Skin:    General: Skin is warm and dry.     Capillary Refill: Capillary refill takes less than 2 seconds.  Neurological:     General: No focal deficit present.     Mental Status: She is alert and oriented to person, place, and time.  Psychiatric:        Mood and Affect: Mood normal.        Behavior: Behavior normal.   Results for orders placed or performed in visit on 01/01/21 (from the past 24 hour(s))  POCT glycosylated hemoglobin (Hb A1C)     Status: Abnormal   Collection Time: 01/01/21 11:26 AM  Result Value Ref Range   Hemoglobin A1C 6.3 (A) 4.0 - 5.6 %   HbA1c POC (<> result, manual entry)     HbA1c, POC (prediabetic range)     HbA1c, POC (controlled diabetic range)       ASSESSMENT & PLAN: A total of 30 minutes was spent with the patient and counseling/coordination of care regarding establishing care with me, review of multiple chronic medical problems and treatments, review of all medications, cardiovascular risks associated with diabetes and hypertension, education  on nutrition, cardiovascular and cancer risks associated with smoking and smoking cessation advice, review of most recent office visit notes by different specialists, review of most recent blood work results including today's hemoglobin A1c, health maintenance items, prognosis, documentation and need for follow-up.  Hypertension associated with diabetes (HCC) Well-controlled hypertension.  Continue amlodipine 10 mg daily and Hyzaar 100-12.5 mg daily. Well-controlled diabetes with hemoglobin A1c of 6.3.  Continue metformin 500 mg twice a day and glimepiride 2 mg daily. Diet and nutrition discussed. Follow-up in 6 months.  Valvular heart disease Stable.  Recent echocardiogram results reviewed.  Sees cardiologist on a regular basis.  Dyslipidemia associated with type 2 diabetes mellitus (HCC) Diet and nutrition discussed.  Continue atorvastatin 10 mg daily.  Anxiety and depression Sees psychiatrist on a regular basis.  Presently on BuSpar, Seroquel, and Effexor. Stable.  Chronic midline low back pain Stable and asymptomatic.  Sees orthopedist on a regular basis. Advised to see the orthopedist also for chronic right shoulder pain.  May benefit from physical therapy. Takita was seen today for transitions of care and diabetes.  Diagnoses and all orders for this visit:  Hypertension associated with diabetes (HCC)  Type 2 diabetes mellitus with hyperglycemia, without long-term current use of insulin (HCC) -     POCT glycosylated hemoglobin (Hb A1C)  Encounter to establish care  Chronic midline low back pain, unspecified whether sciatica present  Tobacco abuse  Dyslipidemia associated with type 2 diabetes mellitus (HCC)  Anxiety and depression  Valvular heart disease  Patient Instructions  Health Maintenance, Female Adopting a healthy lifestyle and getting preventive care are important in promoting health and wellness. Ask your health care provider about: The right schedule for  you to have regular tests and exams. Things you can do on your own to prevent diseases and keep yourself healthy. What should I know about diet, weight, and exercise? Eat a healthy diet  Eat a diet that includes plenty of vegetables, fruits, low-fat dairy products, and lean protein. Do not eat a lot of foods that are high in solid fats,  added sugars, or sodium.  Maintain a healthy weight Body mass index (BMI) is used to identify weight problems. It estimates body fat based on height and weight. Your health care provider can help determineyour BMI and help you achieve or maintain a healthy weight. Get regular exercise Get regular exercise. This is one of the most important things you can do for your health. Most adults should: Exercise for at least 150 minutes each week. The exercise should increase your heart rate and make you sweat (moderate-intensity exercise). Do strengthening exercises at least twice a week. This is in addition to the moderate-intensity exercise. Spend less time sitting. Even light physical activity can be beneficial. Watch cholesterol and blood lipids Have your blood tested for lipids and cholesterol at 60 years of age, then havethis test every 5 years. Have your cholesterol levels checked more often if: Your lipid or cholesterol levels are high. You are older than 60 years of age. You are at high risk for heart disease. What should I know about cancer screening? Depending on your health history and family history, you may need to have cancer screening at various ages. This may include screening for: Breast cancer. Cervical cancer. Colorectal cancer. Skin cancer. Lung cancer. What should I know about heart disease, diabetes, and high blood pressure? Blood pressure and heart disease High blood pressure causes heart disease and increases the risk of stroke. This is more likely to develop in people who have high blood pressure readings, are of African descent, or are  overweight. Have your blood pressure checked: Every 3-5 years if you are 7318-60 years of age. Every year if you are 60 years old or older. Diabetes Have regular diabetes screenings. This checks your fasting blood sugar level. Have the screening done: Once every three years after age 60 if you are at a normal weight and have a low risk for diabetes. More often and at a younger age if you are overweight or have a high risk for diabetes. What should I know about preventing infection? Hepatitis B If you have a higher risk for hepatitis B, you should be screened for this virus. Talk with your health care provider to find out if you are at risk forhepatitis B infection. Hepatitis C Testing is recommended for: Everyone born from [id number] through 1965. Anyone with known risk factors for hepatitis C. Sexually transmitted infections (STIs) Get screened for STIs, including gonorrhea and chlamydia, if: You are sexually active and are younger than 60 years of age. You are older than 60 years of age and your health care provider tells you that you are at risk for this type of infection. Your sexual activity has changed since you were last screened, and you are at increased risk for chlamydia or gonorrhea. Ask your health care provider if you are at risk. Ask your health care provider about whether you are at high risk for HIV. Your health care provider may recommend a prescription medicine to help prevent HIV infection. If you choose to take medicine to prevent HIV, you should first get tested for HIV. You should then be tested every 3 months for as long as you are taking the medicine. Pregnancy If you are about to stop having your period (premenopausal) and you may become pregnant, seek counseling before you get pregnant. Take 400 to 800 micrograms (mcg) of folic acid every day if you become pregnant. Ask for birth control (contraception) if you want to prevent pregnancy. Osteoporosis and  menopause Osteoporosis is a  disease in which the bones lose minerals and strength with aging. This can result in bone fractures. If you are 30 years old or older, or if you are at risk for osteoporosis and fractures, ask your health care provider if you should: Be screened for bone loss. Take a calcium or vitamin D supplement to lower your risk of fractures. Be given hormone replacement therapy (HRT) to treat symptoms of menopause. Follow these instructions at home: Lifestyle Do not use any products that contain nicotine or tobacco, such as cigarettes, e-cigarettes, and chewing tobacco. If you need help quitting, ask your health care provider. Do not use street drugs. Do not share needles. Ask your health care provider for help if you need support or information about quitting drugs. Alcohol use Do not drink alcohol if: Your health care provider tells you not to drink. You are pregnant, may be pregnant, or are planning to become pregnant. If you drink alcohol: Limit how much you use to 0-1 drink a day. Limit intake if you are breastfeeding. Be aware of how much alcohol is in your drink. In the [place] one drink equals one 12 oz bottle of beer (355 mL), one 5 oz glass of wine (148 mL), or one 1 oz glass of hard liquor (44 mL). General instructions Schedule regular health, dental, and eye exams. Stay current with your vaccines. Tell your health care provider if: You often feel depressed. You have ever been abused or do not feel safe at home. Summary Adopting a healthy lifestyle and getting preventive care are important in promoting health and wellness. Follow your health care provider's instructions about healthy diet, exercising, and getting tested or screened for diseases. Follow your health care provider's instructions on monitoring your cholesterol and blood pressure. This information is not intended to replace advice given to you by your health care provider. Make sure you discuss any  questions you have with your healthcare provider. Document Revised: 06/16/2018 Document Reviewed: 06/16/2018 Elsevier Patient Education  2022 Elsevier Inc.   Edwina Barth, MD [facility]

## 2021-01-17 ENCOUNTER — Ambulatory Visit (HOSPITAL_COMMUNITY): Payer: Self-pay | Admitting: Licensed Clinical Social Worker

## 2021-01-23 ENCOUNTER — Ambulatory Visit (INDEPENDENT_AMBULATORY_CARE_PROVIDER_SITE_OTHER): Admitting: Orthopaedic Surgery

## 2021-01-23 ENCOUNTER — Ambulatory Visit (INDEPENDENT_AMBULATORY_CARE_PROVIDER_SITE_OTHER)

## 2021-01-23 ENCOUNTER — Encounter: Payer: Self-pay | Admitting: Orthopaedic Surgery

## 2021-01-23 DIAGNOSIS — M25511 Pain in right shoulder: Secondary | ICD-10-CM

## 2021-01-23 DIAGNOSIS — M7541 Impingement syndrome of right shoulder: Secondary | ICD-10-CM

## 2021-01-23 MED ORDER — LIDOCAINE HCL 1 % IJ SOLN
3.0000 mL | INTRAMUSCULAR | Status: AC | PRN
  Administered 2021-01-23: 3 mL

## 2021-01-23 MED ORDER — METHYLPREDNISOLONE ACETATE 40 MG/ML IJ SUSP
40.0000 mg | INTRAMUSCULAR | Status: AC | PRN
  Administered 2021-01-23: 40 mg via INTRA_ARTICULAR

## 2021-01-23 NOTE — Progress Notes (Signed)
Office Visit Note   Patient: Stephanie Joseph           Date of Birth: Aug 16, 1960           MRN: [id number] Visit Date: 01/23/2021              Requested by: Georgina Quint, MD [address] [place],  [place] [address] PCP: Georgina Quint, MD   Assessment & Plan: Visit Diagnoses:  1. Acute pain of right shoulder   2. Impingement syndrome of right shoulder     Plan: Her signs and symptoms and radiographic studies are consistent with severe impingement syndrome of the right shoulder.  She does not need physical therapy since her range of motion is full and have encouraged her to keep moving her shoulder.  I recommended a subacromial outlet steroid injection and described what this involves as well as the risk and benefits of steroid injections.  She agreed to this treatment plan and tolerated it well.  I have encouraged her to keep using her shoulder.  I would like to see her back in 4 weeks to see how she is doing overall.  All questions and concerns were answered and addressed.  Follow-Up Instructions: Return in about 4 weeks (around 02/20/2021).   Orders:  Orders Placed This Encounter  Procedures   Large Joint Inj   XR Shoulder Right   No orders of the defined types were placed in this encounter.     Procedures: Large Joint Inj: R subacromial bursa on 01/23/2021 10:33 AM Indications: pain and diagnostic evaluation Details: 22 G 1.5 in needle  Arthrogram: No  Medications: 3 mL lidocaine 1 %; 40 mg methylPREDNISolone acetate 40 MG/ML Outcome: tolerated well, no immediate complications Procedure, treatment alternatives, risks and benefits explained, specific risks discussed. Consent was given by the patient. Immediately prior to procedure a time out was called to verify the correct patient, procedure, equipment, support staff and site/side marked as required. Patient was prepped and draped in the usual sterile fashion.      Clinical Data: No  additional findings.   Subjective: Chief Complaint  Patient presents with   Right Shoulder - Pain  The patient is a very pleasant 60 year old female comes in with right shoulder pain for about 2 months now with no known injury.  It hurts with reaching overhead and behind her.  She has no numbness and tingling either.  She is dealt with some neck pain in the past.  She is tried some Tylenol but has not really helped.  She does not have any type of therapy or injection.  She denies any loss of range of motion of the shoulder.  She is a diabetic but reports good blood glucose control.  HPI  Review of Systems There is currently listed no headache, chest pain, shortness of breath, fever, chills, nausea, vomiting  Objective: Vital Signs: LMP 02/13/2014 (Within Days)   Physical Exam She is alert and oriented x3 and in no acute distress Ortho Exam Examination of her right shoulder shows her range of motion is full and fluid but she definitely shows signs of impingement.  Rotator cuff is strong. Specialty Comments:  No specialty comments available.  Imaging: XR Shoulder Right  Result Date: 01/23/2021 He is of the right shoulder show well located shoulder with no acute findings.  There is significant decrease/narrowing in the subacromial outlet.    PMFS History: Patient Active Problem List   Diagnosis Date Noted   Chronic  midline low back pain 07/12/2018   Glaucoma 05/21/2017   Class 1 obesity due to excess calories with serious comorbidity and body mass index (BMI) of 33.0 to 33.9 in adult 09/19/2016   Vitamin D deficiency 09/19/2016   Dyslipidemia associated with type 2 diabetes mellitus (HCC) 07/10/2015   Hormone replacement therapy (postmenopausal) 10/04/2014   Mitral regurgitation 11/05/2012   Valvular heart disease 10/21/2011   Menorrhagia, premenopausal 09/01/2011   Anxiety and depression 03/04/2010   GOUT 12/16/2006   ANEMIA, IRON DEFICIENCY NOS 12/16/2006   Tobacco abuse  12/16/2006   Hypertension associated with diabetes (HCC) 12/16/2006   Past Medical History:  Diagnosis Date   Anemia, iron deficiency    Arthritis    Depression    Diabetes mellitus without complication (HCC)    Dysmenorrhea    Gout    Heart valve problem    Hypertension    Menorrhagia    Mitral regurgitation    Pneumonia     Family History  Problem Relation Age of Onset   Hypertension Mother    Stroke Paternal Grandmother    Heart disease Sister    Heart disease Brother     Past Surgical History:  Procedure Laterality Date   KNEE ARTHROSCOPY     Left   Social History   Occupational History   Occupation: Disability Pending  Tobacco Use   Smoking status: Every Day    Packs/day: 0.50    Years: 7.00    Pack years: 3.50    Types: Cigarettes   Smokeless tobacco: Never  Vaping Use   Vaping Use: Never used  Substance and Sexual Activity   Alcohol use: No   Drug use: No    Types: "Crack" cocaine   Sexual activity: Yes    Birth control/protection: None

## 2021-02-11 ENCOUNTER — Encounter: Payer: Self-pay | Admitting: Family Medicine

## 2021-02-11 ENCOUNTER — Ambulatory Visit (INDEPENDENT_AMBULATORY_CARE_PROVIDER_SITE_OTHER): Admitting: Family Medicine

## 2021-02-11 VITALS — BP 131/80 | HR 89 | Ht 67.0 in | Wt 211.5 lb

## 2021-02-11 DIAGNOSIS — G4733 Obstructive sleep apnea (adult) (pediatric): Secondary | ICD-10-CM | POA: Diagnosis not present

## 2021-02-11 NOTE — Patient Instructions (Addendum)
Please continue using your CPAP regularly. While your insurance requires that you use CPAP at least 4 hours each night on 70% of the nights, I recommend, that you not skip any nights and use it throughout the night if you can. Getting used to CPAP and staying with the treatment long term does take time and patience and discipline. Untreated obstructive sleep apnea when it is moderate to severe can have an adverse impact on cardiovascular health and raise her risk for heart disease, arrhythmias, hypertension, congestive heart failure, stroke and diabetes. Untreated obstructive sleep apnea causes sleep disruption, nonrestorative sleep, and sleep deprivation. This can have an impact on your day to day functioning and cause daytime sleepiness and impairment of cognitive function, memory loss, mood disturbance, and problems focussing. Using CPAP regularly can improve these symptoms.   You are doing great. Continue working on compliance. Follow up in 6 months.

## 2021-02-11 NOTE — Progress Notes (Addendum)
PATIENT: Stephanie Joseph DOB: [birth year]  REASON FOR VISIT: follow up HISTORY FROM: patient  Chief Complaint  Patient presents with   Follow-up    RM 1, alone. Here for initial cpap follow up. DME: [employer]. Stephanie Joseph      HISTORY OF PRESENT ILLNESS: 02/11/21 ALL:  Stephanie Joseph is a 60 y.o. female here today for follow up for OSA on CPAP. Sleep study 03/2020 showed moderate OSA with AHI of 11.2/hr, REM AHI 18.4 and O2 nadir 82%. She is doing well on CPAP therapy. She is adjusting to using it every night. She does fight with the hose at times. She feels that she is sleeping better. She wakes more refreshed and isn't as tired throughout the day. She denies concerns with machine or supplies.   Compliance report dated 01/06/2021-02/11/2021 shows she used CPAP 32/37 days for compliance of 86.5% and 27/37 days for greater than 4 hours (73%). Average use was 5hr and . Residual AHI was 4.8 on 5-11cmH20. No leak noted.    HISTORY: (copied from Dr Teofilo Pod previous note)  Dear Stephanie Joseph,   I saw your patient, Stephanie Joseph, upon your kind request, in my Sleep clinic today for initial consultation of her sleep disorder, in particular, evaluation of her prior diagnosis of sleep apnea.  The patient is unaccompanied today.  As you know, Ms, Gartley is a 60 year old right-handed woman with an underlying medical history of hypertension, gout, diabetes, anemia, arthritis, depression, mitral regurgitation and obesity, who was previously diagnosed with obstructive sleep apnea. She has not been on CPAP therapy; it was recommended, but she could not start treatment due to cost as I understand.  I have reviewed your office note from 01/27/2020.  Sleep study testing was several years ago.  Her Epworth sleepiness score is 11 out of 24. Prior sleep study results are not available for my review today.  She reports snoring and witnessed apneas, excessive daytime somnolence, nocturia is  about 3 times per average night and lately, in the recent past, she has had occasional nocturnal headaches, by the time she gets out of bed in the morning the headaches are typically resolved.  She has been followed by [facility] mental [facility] for her depression and anxiety and is going to establish with a new provider soon.  She has been on Seroquel 300 mg strength, takes half a pill at night as needed.  She also takes Effexor.  She has no family history of sleep apnea that she is aware of.  She lives with her husband her and her 67 year old daughter, they have no pets at the house.  She does have a TV in the bedroom and has it on at night but tries to turn it off or puts it on a timer at night.  She is generally in bed around 8 PM and rise time is around 7:30 AM.  She does not work.  Weight has been fairly stable.  She drinks caffeine in the form of coffee, typically 2 cups in the morning and occasional soda, typically no tea.  She does not drink any alcohol and is working on smoking cessation, currently smokes about 4 to 5 cigarettes/day.   REVIEW OF SYSTEMS: Out of a complete 14 system review of symptoms, the patient complains only of the following symptoms, fatigue and all other reviewed systems are negative.  ESS: 12  ALLERGIES: No Known Allergies  HOME MEDICATIONS: Outpatient Medications Prior to Visit  Medication Sig Dispense Refill  amLODipine (NORVASC) 10 MG tablet Take 1 tablet (10 mg total) by mouth daily. 90 tablet 3   atorvastatin (LIPITOR) 10 MG tablet Take 1 tablet (10 mg total) by mouth daily. 90 tablet 3   brimonidine-timolol (COMBIGAN) 0.2-0.5 % ophthalmic solution Place 1 drop into both eyes every 12 (twelve) hours.     busPIRone (BUSPAR) 10 MG tablet Take 1 tablet (10 mg total) by mouth 3 (three) times daily. 90 tablet 2   fluconazole (DIFLUCAN) 150 MG tablet 1 tablet po qd as directed; repeat after 72 hours 2 tablet 0   glimepiride (AMARYL) 2 MG tablet Take 1 tablet (2 mg total)  by mouth daily with breakfast. 90 tablet 1   glucose blood (ACCU-CHEK AVIVA) test strip Use to check blood sugars 1-3 times daily. Dx Code: E11.9 300 each 1   hydrOXYzine (ATARAX/VISTARIL) 25 MG tablet Take 1 tablet (25 mg total) by mouth every 8 (eight) hours as needed for itching. 120 tablet 2   Lancets (ACCU-CHEK SOFT TOUCH) lancets Use as instructed 100 each 12   losartan-hydrochlorothiazide (HYZAAR) 100-12.5 MG tablet Take 1 tablet by mouth daily. 90 tablet 3   metFORMIN (GLUCOPHAGE) 500 MG tablet TAKE 1 TABLET BY MOUTH TWICE DAILY WITH A MEAL 180 tablet 0   methocarbamol (ROBAXIN) 500 MG tablet TAKE ONE TABLET BY MOUTH EVERY 8 HOURS AS NEEDED FOR MUSCLE SPASMS 90 tablet 3   Misc. Devices (CANE) MISC Use as needed for left knee pain. 1 each 0   naproxen (NAPROSYN) 375 MG tablet Take 1 tablet (375 mg total) by mouth 2 (two) times daily as needed for moderate pain. 180 tablet 0   QUEtiapine (SEROQUEL) 300 MG tablet Take 1 tablet (300 mg total) by mouth at bedtime as needed (sleep). 30 tablet 2   venlafaxine (EFFEXOR) 75 MG tablet Take 1 tablet (75 mg total) by mouth 3 (three) times daily with meals. 90 tablet 2   No facility-administered medications prior to visit.    PAST MEDICAL HISTORY: Past Medical History:  Diagnosis Date   Anemia, iron deficiency    Arthritis    Depression    Diabetes mellitus without complication (HCC)    Dysmenorrhea    Gout    Heart valve problem    Hypertension    Menorrhagia    Mitral regurgitation    Pneumonia     PAST SURGICAL HISTORY: Past Surgical History:  Procedure Laterality Date   KNEE ARTHROSCOPY     Left    FAMILY HISTORY: Family History  Problem Relation Age of Onset   Hypertension Mother    Stroke Paternal Grandmother    Heart disease Sister    Heart disease Brother     SOCIAL HISTORY: Social History   Socioeconomic History   Marital status: Married    Spouse name: Not on file   Number of children: 1   Years of  education: 12   Highest education level: Not on file  Occupational History   Occupation: Disability Pending  Tobacco Use   Smoking status: Every Day    Packs/day: 0.50    Years: 7.00    Pack years: 3.50    Types: Cigarettes   Smokeless tobacco: Never  Vaping Use   Vaping Use: Never used  Substance and Sexual Activity   Alcohol use: No   Drug use: No    Types: "Crack" cocaine   Sexual activity: Yes    Birth control/protection: None  Other Topics Concern   Not on file  Social History Narrative   Fun: Go to church   Denies abuse and feels safe at home.    Social Determinants of Health   Financial Resource Strain: Not on file  Food Insecurity: Not on file  Transportation Needs: Not on file  Physical Activity: Not on file  Stress: Not on file  Social Connections: Not on file  Intimate Partner Violence: Not on file     PHYSICAL EXAM  Vitals:   02/11/21 1401  BP: 131/80  Pulse: 89  SpO2: 99%  Weight: 211 lb 8 oz (95.9 kg)  Height: 5\' 7"  (1.702 m)   Body mass index is 33.13 kg/m.  Generalized: Well developed, in no acute distress  Cardiology: normal rate and rhythm, no murmur noted Respiratory: clear to auscultation bilaterally  Neurological examination  Mentation: Alert oriented to time, place, history taking. Follows all commands speech and language fluent Cranial nerve II-XII: Pupils were equal round reactive to light. Extraocular movements were full, visual field were full  Motor: The motor testing reveals 5 over 5 strength of all 4 extremities. Good symmetric motor tone is noted throughout.  Gait and station: Gait is normal.    DIAGNOSTIC DATA (LABS, IMAGING, TESTING) - I reviewed patient records, labs, notes, testing and imaging myself where available.  No flowsheet data found.   Lab Results  Component Value Date   WBC 7.5 01/27/2020   HGB 11.8 01/27/2020   HCT 35.5 01/27/2020   MCV 83.3 01/27/2020   PLT 271 01/27/2020      Component Value  Date/Time   NA 138 04/10/2020 0934   K 4.1 04/10/2020 0934   CL 105 04/10/2020 0934   CO2 26 04/10/2020 0934   GLUCOSE 190 (H) 04/10/2020 0934   BUN 10 04/10/2020 0934   CREATININE 0.83 04/10/2020 0934   CREATININE 0.91 01/27/2020 1011   CALCIUM 8.9 04/10/2020 0934   PROT 6.6 04/10/2020 0934   ALBUMIN 3.7 04/10/2020 0934   AST 12 04/10/2020 0934   ALT 13 04/10/2020 0934   ALKPHOS 66 04/10/2020 0934   BILITOT 0.3 04/10/2020 0934   GFRNONAA >60 12/08/2019 1611   GFRAA >60 12/08/2019 1611   Lab Results  Component Value Date   CHOL 114 01/27/2020   HDL 69 01/27/2020   LDLCALC 31 01/27/2020   TRIG 60 01/27/2020   CHOLHDL 1.7 01/27/2020   Lab Results  Component Value Date   HGBA1C 6.3 (A) 01/01/2021   Lab Results  Component Value Date   VITAMINB12 501 07/13/2020   Lab Results  Component Value Date   TSH 0.59 05/10/2019     ASSESSMENT AND PLAN 60 y.o. year old female  has a past medical history of Anemia, iron deficiency, Arthritis, Depression, Diabetes mellitus without complication (HCC), Dysmenorrhea, Gout, Heart valve problem, Hypertension, Menorrhagia, Mitral regurgitation, and Pneumonia. here with     ICD-10-CM   1. OSA (obstructive sleep apnea)  G47.33         Dinah Lupa is doing well on CPAP therapy. Compliance report reveals optimal daily and 4 hour compliance. She was encouraged to continue using CPAP nightly and for greater than 4 hours each night. We will update supply orders as indicated. Risks of untreated sleep apnea review and education materials provided. Healthy lifestyle habits encouraged. She will follow up in 6 months, sooner if needed. She verbalizes understanding and agreement with this plan.    No orders of the defined types were placed in this encounter.    No orders of  the defined types were placed in this encounter.     Shawnie Dappermy Yonatan Guitron, FNP-C 02/11/2021, 2:41 PM [facility] [address], [address] [place], [place] [address] [phone]  I reviewed the above note and documentation by the Nurse Practitioner and agree with the history, exam, assessment and plan as outlined above. I was available for consultation. Huston FoleySaima Athar, MD, PhD [facility])

## 2021-02-20 ENCOUNTER — Ambulatory Visit: Admitting: Orthopaedic Surgery

## 2021-03-04 ENCOUNTER — Ambulatory Visit (INDEPENDENT_AMBULATORY_CARE_PROVIDER_SITE_OTHER): Admitting: Orthopaedic Surgery

## 2021-03-04 ENCOUNTER — Other Ambulatory Visit: Payer: Self-pay

## 2021-03-04 ENCOUNTER — Encounter: Payer: Self-pay | Admitting: Orthopaedic Surgery

## 2021-03-04 DIAGNOSIS — M25511 Pain in right shoulder: Secondary | ICD-10-CM

## 2021-03-04 DIAGNOSIS — M7541 Impingement syndrome of right shoulder: Secondary | ICD-10-CM

## 2021-03-04 NOTE — Progress Notes (Signed)
Patient comes in with continued right shoulder pain and impingement symptoms.  Range of motion has been excellent.  We saw her back 4 weeks ago and provided a steroid injection in the right shoulder subacromial outlet.  That did really well for her for a few weeks but now she is having some pain with overhead activities and reaching in different ways.  She denies any weakness of the right shoulder.  Her shoulder is well located on exam and moves smoothly and fluidly with no weakness at all.  At this point I would recommend outpatient physical therapy for various modalities that can help improve her shoulder function in terms of decreased pain and an increased strength with any modalities per therapist discretion.  We can then see her back in 6 weeks to see how she is doing overall.  Previous x-rays of the shoulder are normal.

## 2021-03-12 ENCOUNTER — Ambulatory Visit (INDEPENDENT_AMBULATORY_CARE_PROVIDER_SITE_OTHER): Admitting: Psychiatry

## 2021-03-12 ENCOUNTER — Other Ambulatory Visit: Payer: Self-pay

## 2021-03-12 ENCOUNTER — Encounter (HOSPITAL_COMMUNITY): Payer: Self-pay | Admitting: Psychiatry

## 2021-03-12 DIAGNOSIS — F411 Generalized anxiety disorder: Secondary | ICD-10-CM

## 2021-03-12 DIAGNOSIS — F330 Major depressive disorder, recurrent, mild: Secondary | ICD-10-CM | POA: Diagnosis not present

## 2021-03-12 MED ORDER — HYDROXYZINE HCL 25 MG PO TABS: 25.0000 mg | ORAL_TABLET | Freq: Three times a day (TID) | ORAL | 3 refills | Status: DC | PRN

## 2021-03-12 MED ORDER — VENLAFAXINE HCL 75 MG PO TABS: 75.0000 mg | ORAL_TABLET | Freq: Three times a day (TID) | ORAL | 3 refills | Status: DC

## 2021-03-12 MED ORDER — BUSPIRONE HCL 10 MG PO TABS: 10.0000 mg | ORAL_TABLET | Freq: Three times a day (TID) | ORAL | 3 refills | Status: DC

## 2021-03-12 MED ORDER — QUETIAPINE FUMARATE 300 MG PO TABS: 300.0000 mg | ORAL_TABLET | Freq: Every evening | ORAL | 3 refills | Status: DC | PRN

## 2021-03-12 NOTE — Progress Notes (Signed)
BH MD/PA/NP OP Progress Note    03/12/2021 10:48 AM Stephanie Joseph  MRN:  [id number]  Chief Complaint:  "My husband left me"  Chief Complaint   Medication Management    HPI: 60 year old female seen today for follow up psychiatric evaluation.   She has a psychiatric history of depression, anxiety, alochol use (in remission), cannabis use disorder (in remission), and cocaine use disorder (in remission).  She is managed on hydroxyzine 25 mg every 8 hours as needed, Seroquel 300 nightly, Buspar 10 mg three times daily, and Effexor 75 mg three times daily. She informed Clinical research associate that her medications are effective in managing her psychiatric conditions.    Today she is well groomed, pleasant, cooperative, engaged in conversation, and maintained eye contact. She informed Clinical research associate that on July 4th her husband left her.  She notes that he continues to suffer from alcohol use and reports that he told her that it was unfair to her.  She notes that she would like to fight for her marriage but at this time feels like her hands are tied.  She notes that this new stressor has exacerbated her anxiety and depression however she notes her medications are effective.  Today provider conducted a GAD-7 and P scored 17, at her last visit she scored a 1.  Provider also conducted a PHQ-9 of P scored a 13, at her last visit she scored a 3.  She notes that her appetite has been poor and reports that she is lost 8 pounds.  She endorses having adequate sleep.  Patient notes that prayer, her daughter, her church members, and her sister has been supportive of her during this time.  Today she denies SI/HI/VAH, mania, or paranoia.    Despite life stressors patient notes that she is doing well.  No medication changes made today. Patient will continue medications as prescribes.  She will follow-up with outpatient counseling for therapy.  No other concerns noted at this time.   Visit Diagnosis:    ICD-10-CM   1. Generalized  anxiety disorder  F41.1 busPIRone (BUSPAR) 10 MG tablet    hydrOXYzine (ATARAX/VISTARIL) 25 MG tablet    2. Mild episode of recurrent major depressive disorder (HCC)  F33.0 busPIRone (BUSPAR) 10 MG tablet    QUEtiapine (SEROQUEL) 300 MG tablet    venlafaxine (EFFEXOR) 75 MG tablet      Past Psychiatric History:depression, anxiety, alochol use (in remission), cannabis use disorder (in remission), and cocaine use disorder (in remission) Past Medical History:  Past Medical History:  Diagnosis Date   Anemia, iron deficiency    Arthritis    Depression    Diabetes mellitus without complication (HCC)    Dysmenorrhea    Gout    Heart valve problem    Hypertension    Menorrhagia    Mitral regurgitation    Pneumonia     Past Surgical History:  Procedure Laterality Date   KNEE ARTHROSCOPY     Left    Family Psychiatric History: Brother schizophrenia (deceased)  Family History:  Family History  Problem Relation Age of Onset   Hypertension Mother    Stroke Paternal Grandmother    Heart disease Sister    Heart disease Brother     Social History:  Social History   Socioeconomic History   Marital status: Married    Spouse name: Not on file   Number of children: 1   Years of education: 12   Highest education level: Not on file  Occupational History   Occupation: Disability Pending  Tobacco Use   Smoking status: Every Day    Packs/day: 0.50    Years: 7.00    Pack years: 3.50    Types: Cigarettes   Smokeless tobacco: Never  Vaping Use   Vaping Use: Never used  Substance and Sexual Activity   Alcohol use: No   Drug use: No    Types: "Crack" cocaine   Sexual activity: Yes    Birth control/protection: None  Other Topics Concern   Not on file  Social History Narrative   Fun: Go to church   Denies abuse and feels safe at home.    Social Determinants of Health   Financial Resource Strain: Not on file  Food Insecurity: Not on file  Transportation Needs: Not on file   Physical Activity: Not on file  Stress: Not on file  Social Connections: Not on file    Allergies: No Known Allergies  Metabolic Disorder Labs: Lab Results  Component Value Date   HGBA1C 6.3 (A) 01/01/2021   MPG 197 01/27/2020   No results found for: PROLACTIN Lab Results  Component Value Date   CHOL 114 01/27/2020   TRIG 60 01/27/2020   HDL 69 01/27/2020   CHOLHDL 1.7 01/27/2020   VLDL 17.6 11/19/2018   LDLCALC 31 01/27/2020   LDLCALC 28 11/19/2018   Lab Results  Component Value Date   TSH 0.59 05/10/2019   TSH 0.55 09/16/2017    Therapeutic Level Labs: No results found for: LITHIUM No results found for: VALPROATE No components found for:  CBMZ  Current Medications: Current Outpatient Medications  Medication Sig Dispense Refill   amLODipine (NORVASC) 10 MG tablet Take 1 tablet (10 mg total) by mouth daily. 90 tablet 3   atorvastatin (LIPITOR) 10 MG tablet Take 1 tablet (10 mg total) by mouth daily. 90 tablet 3   brimonidine-timolol (COMBIGAN) 0.2-0.5 % ophthalmic solution Place 1 drop into both eyes every 12 (twelve) hours.     fluconazole (DIFLUCAN) 150 MG tablet 1 tablet po qd as directed; repeat after 72 hours 2 tablet 0   glimepiride (AMARYL) 2 MG tablet Take 1 tablet (2 mg total) by mouth daily with breakfast. 90 tablet 1   glucose blood (ACCU-CHEK AVIVA) test strip Use to check blood sugars 1-3 times daily. Dx Code: E11.9 300 each 1   Lancets (ACCU-CHEK SOFT TOUCH) lancets Use as instructed 100 each 12   losartan-hydrochlorothiazide (HYZAAR) 100-12.5 MG tablet Take 1 tablet by mouth daily. 90 tablet 3   metFORMIN (GLUCOPHAGE) 500 MG tablet TAKE 1 TABLET BY MOUTH TWICE DAILY WITH A MEAL 180 tablet 0   methocarbamol (ROBAXIN) 500 MG tablet TAKE ONE TABLET BY MOUTH EVERY 8 HOURS AS NEEDED FOR MUSCLE SPASMS 90 tablet 3   Misc. Devices (CANE) MISC Use as needed for left knee pain. 1 each 0   naproxen (NAPROSYN) 375 MG tablet Take 1 tablet (375 mg total) by mouth 2  (two) times daily as needed for moderate pain. 180 tablet 0   busPIRone (BUSPAR) 10 MG tablet Take 1 tablet (10 mg total) by mouth 3 (three) times daily. 90 tablet 3   hydrOXYzine (ATARAX/VISTARIL) 25 MG tablet Take 1 tablet (25 mg total) by mouth every 8 (eight) hours as needed for itching. 120 tablet 3   QUEtiapine (SEROQUEL) 300 MG tablet Take 1 tablet (300 mg total) by mouth at bedtime as needed (sleep). 30 tablet 3   venlafaxine (EFFEXOR) 75 MG tablet Take 1  tablet (75 mg total) by mouth 3 (three) times daily with meals. 90 tablet 3   No current facility-administered medications for this visit.     Musculoskeletal: Strength & Muscle Tone: within normal limits Gait & Station: normal Patient leans: N/A  Psychiatric Specialty Exam: Review of Systems  Blood pressure 120/81, pulse 85, height 5\' 7"  (1.702 m), weight 204 lb (92.5 kg), last menstrual period 02/13/2014.Body mass index is 31.95 kg/m.  General Appearance: Well Groomed  Eye Contact:  Good  Speech:  Clear and Coherent and Normal Rate  Volume:  Normal  Mood:  Anxious and Depressed  Affect:  Congruent  Thought Process:  Coherent, Goal Directed and Linear  Orientation:  Full (Time, Place, and Person)  Thought Content: WDL and Logical   Suicidal Thoughts:  No  Homicidal Thoughts:  No  Memory:  Immediate;   Good Recent;   Good Remote;   Good  Judgement:  Good  Insight:  Good  Psychomotor Activity:  Normal  Concentration:  Concentration: Good and Attention Span: Good  Recall:  Good  Fund of Knowledge: Good  Language: Good  Akathisia:  No  Handed:  Right  AIMS (if indicated): not done  Assets:  Communication Skills Desire for Improvement Financial Resources/Insurance Housing Social Support  ADL's:  Intact  Cognition: WNL  Sleep:  Good   Screenings: GAD-7    Flowsheet Row Clinical Support from 03/12/2021 in [facility] Clinical Support from 12/11/2020 in [facility] Video Visit from 09/14/2020 in [facility] Clinical Support from 06/14/2020 in [facility] Office Visit from 04/27/2019 in [facility]  Total GAD-7 Score 17 1 2 14 10       PHQ2-9    Flowsheet Row Clinical Support from 03/12/2021 in [facility] Office Visit from 01/01/2021 in [facility] Clinical Support from 12/11/2020 in [facility] Video Visit from 09/14/2020 in [facility] Clinical Support from 06/14/2020 in [facility]  PHQ-2 Total Score 4 0 1 0 2  PHQ-9 Total Score 13 -- 3 6 15       Flowsheet Row Video Visit from 09/14/2020 in [facility]  C-SSRS RISK CATEGORY No Risk        Assessment and Plan: Patient endorses symptoms of anxiety and depression due to life stressors (her husband leaving her).  She notes however that her medications are effective. No medication changes made today. Patient will continue medications as prescribes.  1. Generalized anxiety disorder  Continue- hydrOXYzine (ATARAX/VISTARIL) 25 MG tablet; Take 1 tablet (25 mg total) by mouth every 8 (eight) hours as needed for itching.  Dispense: 120 tablet; Refill: 3 Continue- busPIRone (BUSPAR) 10 MG tablet; Take 1 tablet (10 mg total) by mouth 3 (three) times daily.  Dispense: 90 tablet; Refill: 3   2. Mild episode of recurrent major depressive disorder (HCC)  Continue- venlafaxine (EFFEXOR) 75 MG tablet; Take 1 tablet (75 mg total) by mouth 3 (three) times daily with meals.  Dispense: 90 tablet; Refill: 3 Continue- QUEtiapine (SEROQUEL) 300 MG tablet; Take 1 tablet (300 mg total) by mouth at bedtime as needed (sleep).  Dispense: 30 tablet; Refill: 3 Continue- busPIRone (BUSPAR) 10 MG tablet; Take 1 tablet (10 mg total) by mouth 3 (three) times daily.  Dispense: 90  tablet; Refill: 3  Follow-up in 3 month Follow up with  therapy  Shanna CiscoBrittney E Ryota Treece, NP 03/12/2021, 10:48 AM

## 2021-03-14 ENCOUNTER — Ambulatory Visit (INDEPENDENT_AMBULATORY_CARE_PROVIDER_SITE_OTHER): Admitting: Physical Therapy

## 2021-03-14 ENCOUNTER — Other Ambulatory Visit: Payer: Self-pay

## 2021-03-14 ENCOUNTER — Encounter: Payer: Self-pay | Admitting: Physical Therapy

## 2021-03-14 DIAGNOSIS — M25511 Pain in right shoulder: Secondary | ICD-10-CM

## 2021-03-14 DIAGNOSIS — G8929 Other chronic pain: Secondary | ICD-10-CM

## 2021-03-14 DIAGNOSIS — M6281 Muscle weakness (generalized): Secondary | ICD-10-CM | POA: Diagnosis not present

## 2021-03-14 DIAGNOSIS — R293 Abnormal posture: Secondary | ICD-10-CM | POA: Diagnosis not present

## 2021-03-14 NOTE — Therapy (Signed)
[facility] [address] [place], [place], [address] Phone: [phone]   Fax:  [phone]  Physical Therapy Evaluation  Patient Details  Name: Stephanie Joseph MRN: [id number] Date of Birth: 21-Oct-1960 Referring Provider (PT): Kathryne Hitch, MD   Encounter Date: 03/14/2021   PT End of Session - 03/14/21 1000     Visit Number 1    Number of Visits --   to be determined if pt returns   Date for PT Re-Evaluation 04/13/21    PT Start Time 0930    PT Stop Time 0958    PT Time Calculation (min) 28 min    Activity Tolerance Patient tolerated treatment well             Past Medical History:  Diagnosis Date   Anemia, iron deficiency    Arthritis    Depression    Diabetes mellitus without complication (HCC)    Dysmenorrhea    Gout    Heart valve problem    Hypertension    Menorrhagia    Mitral regurgitation    Pneumonia     Past Surgical History:  Procedure Laterality Date   KNEE ARTHROSCOPY     Left    There were no vitals filed for this visit.    Subjective Assessment - 03/14/21 0935     Subjective Pt is a 60 y/o female who presents to OPPT for Rt shoulder pain x 5 months without known injury.  She reports pain mostly with forward and lateral reaching as well as overhead motion.    Pertinent History OA, depression, DM, HTN    Limitations Lifting;House hold activities    Patient Stated Goals improve pain    Currently in Pain? Yes    Pain Score 0-No pain   up to 6/10   Pain Location Shoulder    Pain Orientation Right    Pain Descriptors / Indicators Aching    Pain Onset More than a month ago    Pain Frequency Intermittent    Aggravating Factors  quick reaching movements, overhead,  lying on Rt side    Pain Relieving Factors avoid provoking positions, doing some exercises (repetitive motions improve pain)                [facility] PT Assessment - 03/14/21 0938       Assessment   Medical Diagnosis  M25.511 (ICD-10-CM) - Acute pain of right shoulder  M75.41 (ICD-10-CM) - Impingement syndrome of right shoulder    Referring Provider (PT) Kathryne Hitch, MD    Onset Date/Surgical Date --   5 months   Hand Dominance Right    Next MD Visit 04/15/21    Prior Therapy about 4-5 years ago [facility]      Precautions   Precautions None      Restrictions   Weight Bearing Restrictions No      Balance Screen   Has the patient fallen in the past 6 months No    Has the patient had a decrease in activity level because of a fear of falling?  No    Is the patient reluctant to leave their home because of a fear of falling?  No      Home Environment   Living Environment Private residence    Living Arrangements Children   adult daughter -independent     Prior Function   Level of Independence Independent    Vocation Unemployed    Leisure go to [employer], walking,  go to park/mall, does do some shoulder ROM exercises      Cognition   Overall Cognitive Status Within Functional Limits for tasks assessed      Observation/Other Assessments   Focus on Therapeutic Outcomes (FOTO)  56 (predicted 64)      Posture/Postural Control   Posture/Postural Control Postural limitations    Postural Limitations Rounded Shoulders;Forward head      ROM / Strength   AROM / PROM / Strength AROM;Strength      AROM   Overall AROM Comments bil shoulers WNL without pain      Strength   Overall Strength Comments no pain with testing    Strength Assessment Site Shoulder    Right/Left Shoulder Right;Left    Right Shoulder Flexion 3+/5    Right Shoulder ABduction 3+/5    Right Shoulder Internal Rotation 5/5    Right Shoulder External Rotation 5/5    Left Shoulder Flexion 4/5    Left Shoulder ABduction 4/5    Left Shoulder Internal Rotation 5/5    Left Shoulder External Rotation 4/5      Palpation   Palpation comment mild tenderness Rt proximal biceps tendon      Special Tests    Special Tests  Rotator Cuff Impingement    Rotator Cuff Impingment tests Leanord Asal test      Hawkins-Kennedy test   Findings Negative                        Objective measurements completed on examination: See above findings.       [facility] Adult PT Treatment/Exercise - 03/14/21 0938       Exercises   Exercises Other Exercises    Other Exercises  see pt instructions - reviewed and answered questions, pt performed 3-5 reps of each with mod cues for technique                     PT Education - 03/14/21 1000     Education Details HEP    Person(s) Educated Patient    Methods Explanation;Demonstration;Handout    Comprehension Verbalized understanding;Returned demonstration;Need further instruction                 PT Long Term Goals - 03/14/21 1003       PT LONG TERM GOAL #1   Title to be determined if pt returns                    Plan - 03/14/21 1000     Clinical Impression Statement Pt is a 60 y/o female who presents to OPPT for Rt shoulder pain x 5 months without known injury.  She demonstrates decreased strength and postural abnormalities.  She reports symptoms improve with repetition, and discussed HEP to address weakness and posture.  Due to financial constraints will see PRN up to 30 days, and if she returns will reassess and set goals.    Personal Factors and Comorbidities Comorbidity 3+    Comorbidities OA, depression, DM, HTN    Examination-Activity Limitations Reach Overhead;Lift;Bed Mobility    Examination-Participation Restrictions Community Activity    Stability/Clinical Decision Making Evolving/Moderate complexity    Clinical Decision Making Moderate    Rehab Potential Good    PT Frequency Other (comment)   up to 1x/wk if pt returns   PT Duration 4 weeks    PT Treatment/Interventions ADLs/Self Care Home Management;Cryotherapy;Electrical Stimulation;Moist Heat;Ultrasound;Iontophoresis 4mg /ml Dexamethasone;Functional mobility  training;Therapeutic activities;Therapeutic exercise;Neuromuscular  re-education;Patient/family education;Manual techniques;Passive range of motion;Taping    PT Next Visit Plan reassess if pt returns, review HEP and write goals, otherwise d/c PT    PT Home Exercise Plan Access Code: [email or web address]             Patient will benefit from skilled therapeutic intervention in order to improve the following deficits and impairments:  Pain, Decreased strength, Postural dysfunction  Visit Diagnosis: Chronic right shoulder pain - Plan: PT plan of care cert/re-cert  Abnormal posture - Plan: PT plan of care cert/re-cert  Muscle weakness (generalized) - Plan: PT plan of care cert/re-cert     Problem List Patient Active Problem List   Diagnosis Date Noted   Chronic midline low back pain 07/12/2018   Glaucoma 05/21/2017   Class 1 obesity due to excess calories with serious comorbidity and body mass index (BMI) of 33.0 to 33.9 in adult 09/19/2016   Vitamin D deficiency 09/19/2016   Dyslipidemia associated with type 2 diabetes mellitus (HCC) 07/10/2015   Hormone replacement therapy (postmenopausal) 10/04/2014   Mitral regurgitation 11/05/2012   Valvular heart disease 10/21/2011   Menorrhagia, premenopausal 09/01/2011   Anxiety and depression 03/04/2010   GOUT 12/16/2006   ANEMIA, IRON DEFICIENCY NOS 12/16/2006   Tobacco abuse 12/16/2006   Hypertension associated with diabetes (HCC) 12/16/2006       Clarita Crane, PT, DPT 03/14/21 10:10 AM     [facility] [address] [place], [place], [address] Phone: [phone]   Fax:  [phone]  Name: Stephanie Joseph MRN: [id number] Date of Birth: Apr 21, 1961

## 2021-03-14 NOTE — Patient Instructions (Signed)
Access Code: [email or web address] URL: [email or web address] Date: 03/14/2021 Prepared by: Moshe Cipro  Exercises Standing Shoulder Flexion to 90 Degrees with Dumbbells - 1 x daily - 7 x weekly - 3 sets - 10 reps Shoulder Abduction with Dumbbells - Thumbs Up - 1 x daily - 7 x weekly - 3 sets - 10 reps Shoulder Overhead Press in Flexion with Dumbbells - 1 x daily - 7 x weekly - 3 sets - 10 reps Standing Shoulder External Rotation with Dumbbell - 1 x daily - 7 x weekly - 3 sets - 10 reps Seated Scapular Retraction - 3-5 x daily - 7 x weekly - 1 sets - 5-10 reps - 5 sec hold

## 2021-04-15 ENCOUNTER — Ambulatory Visit: Admitting: Physician Assistant

## 2021-04-18 ENCOUNTER — Encounter: Payer: Self-pay | Admitting: Nurse Practitioner

## 2021-04-18 ENCOUNTER — Telehealth (INDEPENDENT_AMBULATORY_CARE_PROVIDER_SITE_OTHER): Admitting: Nurse Practitioner

## 2021-04-18 VITALS — BP 144/80 | HR 90

## 2021-04-18 DIAGNOSIS — J069 Acute upper respiratory infection, unspecified: Secondary | ICD-10-CM

## 2021-04-18 DIAGNOSIS — R051 Acute cough: Secondary | ICD-10-CM | POA: Diagnosis not present

## 2021-04-18 DIAGNOSIS — Z720 Tobacco use: Secondary | ICD-10-CM

## 2021-04-18 DIAGNOSIS — R0602 Shortness of breath: Secondary | ICD-10-CM

## 2021-04-18 DIAGNOSIS — R052 Subacute cough: Secondary | ICD-10-CM | POA: Insufficient documentation

## 2021-04-18 MED ORDER — DOXYCYCLINE HYCLATE 100 MG PO TABS: 100.0000 mg | ORAL_TABLET | Freq: Two times a day (BID) | ORAL | 0 refills | Status: AC

## 2021-04-18 MED ORDER — ALBUTEROL SULFATE HFA 108 (90 BASE) MCG/ACT IN AERS
2.0000 | INHALATION_SPRAY | Freq: Four times a day (QID) | RESPIRATORY_TRACT | 0 refills | Status: DC | PRN
Start: 2021-04-18 — End: 2021-07-04

## 2021-04-18 MED ORDER — BENZONATATE 200 MG PO CAPS: 200.0000 mg | ORAL_CAPSULE | Freq: Three times a day (TID) | ORAL | 0 refills | Status: AC | PRN

## 2021-04-18 NOTE — Assessment & Plan Note (Signed)
We will send in some Tessalon Perles to help with cough management.  Patient states she is coughed so much that it does hurt her chest is now sore.  Did discuss other types of chest pain that she needs to be evaluated in urgent or emergent she care situation for those types of symptoms.  Patient knowledge

## 2021-04-18 NOTE — Assessment & Plan Note (Addendum)
Given that symptoms have been going on for a week and have no improvement with over-the-counter treatment patient is a smoker diabetic and has valvular heart disease with high blood pressure we will go ahead and treat her for atypical respiratory infection with doxycycline 100 mg twice daily for 7 days.  Patient also states she had does have a history of pneumonia.  Patient is also complaining with dyspnea on exertion we will send an albuterol inhaler for that we will avoid steroids given diabetes in the past.  Also send in something for cough or patient patient already on a QTC prolonging medication acute time pain in last QTC was 447 so avoid Z-Pak and decision making.

## 2021-04-18 NOTE — Progress Notes (Signed)
Patient ID: Stephanie Joseph, female    DOB: [birth year], 60 y.o.   MRN: [id number]  Virtual visit completed through [employer], a video enabled telemedicine application. Due to national recommendations of social distancing due to COVID-19, a virtual visit is felt to be most appropriate for this patient at this time. Reviewed limitations, risks, security and privacy concerns of performing a virtual visit and the availability of in person appointments. I also reviewed that there may be a patient responsible charge related to this service. The patient agreed to proceed.   Patient location: home Provider location: [place] [facility], office Persons participating in this virtual visit: patient, provider   If any vitals were documented, they were collected by patient at home unless specified below.    BP (!) 144/80 Comment: per patient  Pulse 90   LMP 02/13/2014 (Within Days)    CC: Cough, chest congestion. Subjective:   HPI: Stephanie Joseph is a 60 y.o. female presenting on 04/18/2021 for Cough (Started on 04/12/21, itchy sore throat, fatigue, chest congestion, SOB, runny nose, body aches. No fever, no h/a. Covid test neg on 04/17/21. Has taking OTC Tylenol, cough drops, hot tea. Sx staying the same)    Symptoms on 04/12/2021 Covid test yesterday 04/17/2021 and was negative Tylenol cough drops and hot tea with little relief from the hot tea. Does have a history of PNA. She is a current everyday smoker.   Relevant past medical, surgical, family and social history reviewed and updated as indicated. Interim medical history since our last visit reviewed. Allergies and medications reviewed and updated. Outpatient Medications Prior to Visit  Medication Sig Dispense Refill   amLODipine (NORVASC) 10 MG tablet Take 1 tablet (10 mg total) by mouth daily. 90 tablet 3   atorvastatin (LIPITOR) 10 MG tablet Take 1 tablet (10 mg total) by mouth daily. 90 tablet 3    brimonidine-timolol (COMBIGAN) 0.2-0.5 % ophthalmic solution Place 1 drop into both eyes every 12 (twelve) hours.     busPIRone (BUSPAR) 10 MG tablet Take 1 tablet (10 mg total) by mouth 3 (three) times daily. 90 tablet 3   glimepiride (AMARYL) 2 MG tablet Take 1 tablet (2 mg total) by mouth daily with breakfast. 90 tablet 1   glucose blood (ACCU-CHEK AVIVA) test strip Use to check blood sugars 1-3 times daily. Dx Code: E11.9 300 each 1   hydrOXYzine (ATARAX/VISTARIL) 25 MG tablet Take 1 tablet (25 mg total) by mouth every 8 (eight) hours as needed for itching. 120 tablet 3   Lancets (ACCU-CHEK SOFT TOUCH) lancets Use as instructed 100 each 12   losartan-hydrochlorothiazide (HYZAAR) 100-12.5 MG tablet Take 1 tablet by mouth daily. 90 tablet 3   metFORMIN (GLUCOPHAGE) 500 MG tablet TAKE 1 TABLET BY MOUTH TWICE DAILY WITH A MEAL 180 tablet 0   methocarbamol (ROBAXIN) 500 MG tablet TAKE ONE TABLET BY MOUTH EVERY 8 HOURS AS NEEDED FOR MUSCLE SPASMS 90 tablet 3   Misc. Devices (CANE) MISC Use as needed for left knee pain. 1 each 0   naproxen (NAPROSYN) 375 MG tablet Take 1 tablet (375 mg total) by mouth 2 (two) times daily as needed for moderate pain. 180 tablet 0   venlafaxine (EFFEXOR) 75 MG tablet Take 1 tablet (75 mg total) by mouth 3 (three) times daily with meals. (Patient taking differently: Take 75 mg by mouth. 4 times daily with meals) 90 tablet 3   fluconazole (DIFLUCAN) 150 MG tablet 1 tablet po qd as directed;  repeat after 72 hours (Patient not taking: Reported on 04/18/2021) 2 tablet 0   QUEtiapine (SEROQUEL) 300 MG tablet Take 1 tablet (300 mg total) by mouth at bedtime as needed (sleep). (Patient not taking: Reported on 04/18/2021) 30 tablet 3   No facility-administered medications prior to visit.     Per HPI unless specifically indicated in ROS section below Review of Systems  Constitutional:  Positive for fatigue. Negative for chills and fever.  HENT:  Positive for congestion.  Negative for ear pain, sinus pressure, sinus pain and sore throat.   Respiratory:  Positive for cough (clear and yellow) and shortness of breath (doe).   Cardiovascular:  Positive for chest pain (for cough).  Gastrointestinal:  Negative for abdominal pain, diarrhea, nausea and vomiting.  Musculoskeletal:  Negative for arthralgias and myalgias.  Neurological:  Negative for headaches.  Objective:  BP (!) 144/80 Comment: per patient  Pulse 90   LMP 02/13/2014 (Within Days)   Wt Readings from Last 3 Encounters:  02/11/21 211 lb 8 oz (95.9 kg)  01/01/21 214 lb (97.1 kg)  10/09/20 213 lb (96.6 kg)      Physical exam: Gen: alert, NAD, not ill appearing Pulm: speaks in complete sentences without increased work of breathing Psych: normal mood, normal thought content      Results for orders placed or performed in visit on 01/01/21  POCT glycosylated hemoglobin (Hb A1C)  Result Value Ref Range   Hemoglobin A1C 6.3 (A) 4.0 - 5.6 %   HbA1c POC (<> result, manual entry)     HbA1c, POC (prediabetic range)     HbA1c, POC (controlled diabetic range)     Assessment & Plan:   Problem List Items Addressed This Visit       Respiratory   Upper respiratory tract infection - Primary    Given that symptoms have been going on for a week and have no improvement with over-the-counter treatment patient is a smoker diabetic and has valvular heart disease with high blood pressure we will go ahead and treat her for atypical respiratory infection with doxycycline 100 mg twice daily for 7 days.  Patient also states she had does have a history of pneumonia.  Patient is also complaining with dyspnea on exertion we will send an albuterol inhaler for that we will avoid steroids given diabetes in the past.  Also send in something for cough or patient patient already on a QTC prolonging medication acute time pain in last QTC was 447 so avoid Z-Pak and decision making.      Relevant Medications   doxycycline  (VIBRA-TABS) 100 MG tablet     Other   Tobacco abuse    Patient still smoking less than half a pack per day per patient report.      Acute cough    We will send in some Tessalon Perles to help with cough management.  Patient states she is coughed so much that it does hurt her chest is now sore.  Did discuss other types of chest pain that she needs to be evaluated in urgent or emergent she care situation for those types of symptoms.  Patient knowledge      Relevant Medications   benzonatate (TESSALON) 200 MG capsule   Shortness of breath    Describes as dyspnea with exertion she feels okay sitting down is a smoker does have other comorbidities to worry about we will avoid steroid therapy and do albuterol inhaler.  Did warn patient that using albuterol Hailer  may increase the acute cough.  Also sent in some Tessalon Perles for cough suppression.  Has other signs and symptoms as when she needs to be seen in an emergent or urgent fashion in person patient acknowledged. Pick up albuterol inhaler and requested pharmacy.      Relevant Medications   albuterol (VENTOLIN HFA) 108 (90 Base) MCG/ACT inhaler     No orders of the defined types were placed in this encounter.  No orders of the defined types were placed in this encounter.   I discussed the assessment and treatment plan with the patient. The patient was provided an opportunity to ask questions and all were answered. The patient agreed with the plan and demonstrated an understanding of the instructions. The patient was advised to call back or seek an in-person evaluation if the symptoms worsen or if the condition fails to improve as anticipated.  Follow up plan: No follow-ups on file.  Audria Nine, NP

## 2021-04-18 NOTE — Assessment & Plan Note (Signed)
Describes as dyspnea with exertion she feels okay sitting down is a smoker does have other comorbidities to worry about we will avoid steroid therapy and do albuterol inhaler.  Did warn patient that using albuterol Hailer may increase the acute cough.  Also sent in some Tessalon Perles for cough suppression.  Has other signs and symptoms as when she needs to be seen in an emergent or urgent fashion in person patient acknowledged. Pick up albuterol inhaler and requested pharmacy.

## 2021-04-18 NOTE — Assessment & Plan Note (Signed)
Patient still smoking less than half a pack per day per patient report.

## 2021-05-01 ENCOUNTER — Telehealth: Payer: Self-pay | Admitting: Emergency Medicine

## 2021-05-01 DIAGNOSIS — E119 Type 2 diabetes mellitus without complications: Secondary | ICD-10-CM

## 2021-05-01 DIAGNOSIS — I10 Essential (primary) hypertension: Secondary | ICD-10-CM

## 2021-05-01 NOTE — Telephone Encounter (Signed)
1.Medication Requested: amLODipine (NORVASC) 10 MG tablet  atorvastatin (LIPITOR) 10 MG tablet  losartan-hydrochlorothiazide (HYZAAR) 100-12.5 MG tablet   2. Pharmacy (Name, Street, [facility]):  [facility] - [place], [place] - [address] Phone:  [phone]  Fax:  [phone]      3. On Med List: Y  4. Last Visit with PCP:  6.28.22  5. Next visit date with PCP:  12.29.22   Agent: Please be advised that RX refills may take up to 3 business days. We ask that you follow-up with your pharmacy.

## 2021-05-03 MED ORDER — LOSARTAN POTASSIUM-HCTZ 100-12.5 MG PO TABS: 1.0000 | ORAL_TABLET | Freq: Every day | ORAL | 3 refills | Status: DC

## 2021-05-03 MED ORDER — ATORVASTATIN CALCIUM 10 MG PO TABS
10.0000 mg | ORAL_TABLET | Freq: Every day | ORAL | 3 refills | Status: DC
Start: 2021-05-03 — End: 2022-06-01

## 2021-05-03 MED ORDER — AMLODIPINE BESYLATE 10 MG PO TABS
10.0000 mg | ORAL_TABLET | Freq: Every day | ORAL | 3 refills | Status: DC
Start: 2021-05-03 — End: 2022-06-23

## 2021-05-03 NOTE — Telephone Encounter (Signed)
Refilled medication

## 2021-05-13 ENCOUNTER — Encounter: Payer: Self-pay | Admitting: Nurse Practitioner

## 2021-05-13 ENCOUNTER — Ambulatory Visit (INDEPENDENT_AMBULATORY_CARE_PROVIDER_SITE_OTHER): Admitting: Clinical

## 2021-05-13 ENCOUNTER — Telehealth (INDEPENDENT_AMBULATORY_CARE_PROVIDER_SITE_OTHER): Admitting: Nurse Practitioner

## 2021-05-13 ENCOUNTER — Other Ambulatory Visit: Payer: Self-pay

## 2021-05-13 VITALS — Ht 67.0 in | Wt 205.0 lb

## 2021-05-13 DIAGNOSIS — F411 Generalized anxiety disorder: Secondary | ICD-10-CM | POA: Diagnosis not present

## 2021-05-13 DIAGNOSIS — R0982 Postnasal drip: Secondary | ICD-10-CM | POA: Diagnosis not present

## 2021-05-13 DIAGNOSIS — R052 Subacute cough: Secondary | ICD-10-CM | POA: Diagnosis not present

## 2021-05-13 MED ORDER — GUAIFENESIN ER 600 MG PO TB12: 600.0000 mg | ORAL_TABLET | Freq: Two times a day (BID) | ORAL | 0 refills | Status: AC

## 2021-05-13 MED ORDER — FLUTICASONE PROPIONATE 50 MCG/ACT NA SUSP: 2.0000 | Freq: Every day | NASAL | 6 refills | Status: AC

## 2021-05-13 MED ORDER — BENZONATATE 200 MG PO CAPS: 200.0000 mg | ORAL_CAPSULE | Freq: Three times a day (TID) | ORAL | 0 refills | Status: AC | PRN

## 2021-05-13 NOTE — Assessment & Plan Note (Signed)
Having some postnasal drip that I think is leading to some of the patient's cough.  We will send in some Flonase.  She is already on hydroxyzine 4 times daily so we will avoid adding another antihistamine at current time.  Continue to monitor.

## 2021-05-13 NOTE — Assessment & Plan Note (Signed)
Patient still having a cough albeit symptoms have improved.  States she has not been able to get anything up with the cough.  Did state the Tessalon Perles helped prescribed last month.  We will give her another round of that for a week also write her some Mucinex and Flonase to help.  Continue to monitor follow-up as needed.  Signs and symptoms reviewed as when to seek emergent or urgent health care.

## 2021-05-13 NOTE — Progress Notes (Signed)
Patient ID: Stephanie Joseph, female    DOB: October 25, 1960, 60 y.o.   MRN: [id number]  Virtual visit completed through [employer], a video enabled telemedicine application. Due to national recommendations of social distancing due to COVID-19, a virtual visit is felt to be most appropriate for this patient at this time. Reviewed limitations, risks, security and privacy concerns of performing a virtual visit and the availability of in person appointments. I also reviewed that there may be a patient responsible charge related to this service. The patient agreed to proceed.   Patient location: home Provider location: [place] [facility], office Persons participating in this virtual visit: patient, provider   If any vitals were documented, they were collected by patient at home unless specified below.    Ht 5\' 7"  (1.702 m)   Wt 205 lb (93 kg)   LMP 02/13/2014 (Within Days)   BMI 32.11 kg/m    CC: Cough Subjective:   HPI: Stephanie Joseph is a 60 y.o. female presenting on 05/13/2021 for Cough (Coughing for multiple minutes at a time along with coughing up mucous. )    Cough since last time States that she halls and humidifier Took medications prescribed last time States the cough pills did help Symptoms have improved  Vaccinated against covid. No sick contacts. States that her husband had the same thing but gave it to her. Was seen on video visit on 04/18/2021 Cough is non productive per report.    Relevant past medical, surgical, family and social history reviewed and updated as indicated. Interim medical history since our last visit reviewed. Allergies and medications reviewed and updated. Outpatient Medications Prior to Visit  Medication Sig Dispense Refill   albuterol (VENTOLIN HFA) 108 (90 Base) MCG/ACT inhaler Inhale 2 puffs into the lungs every 6 (six) hours as needed for wheezing or shortness of breath. 8 g 0   amLODipine (NORVASC) 10 MG tablet Take 1  tablet (10 mg total) by mouth daily. 90 tablet 3   atorvastatin (LIPITOR) 10 MG tablet Take 1 tablet (10 mg total) by mouth daily. 90 tablet 3   brimonidine-timolol (COMBIGAN) 0.2-0.5 % ophthalmic solution Place 1 drop into both eyes every 12 (twelve) hours.     busPIRone (BUSPAR) 10 MG tablet Take 1 tablet (10 mg total) by mouth 3 (three) times daily. 90 tablet 3   glimepiride (AMARYL) 2 MG tablet Take 1 tablet (2 mg total) by mouth daily with breakfast. 90 tablet 1   glucose blood (ACCU-CHEK AVIVA) test strip Use to check blood sugars 1-3 times daily. Dx Code: E11.9 300 each 1   hydrOXYzine (ATARAX/VISTARIL) 25 MG tablet Take 1 tablet (25 mg total) by mouth every 8 (eight) hours as needed for itching. 120 tablet 3   Lancets (ACCU-CHEK SOFT TOUCH) lancets Use as instructed 100 each 12   losartan-hydrochlorothiazide (HYZAAR) 100-12.5 MG tablet Take 1 tablet by mouth daily. 90 tablet 3   metFORMIN (GLUCOPHAGE) 500 MG tablet TAKE 1 TABLET BY MOUTH TWICE DAILY WITH A MEAL 180 tablet 0   methocarbamol (ROBAXIN) 500 MG tablet TAKE ONE TABLET BY MOUTH EVERY 8 HOURS AS NEEDED FOR MUSCLE SPASMS 90 tablet 3   Misc. Devices (CANE) MISC Use as needed for left knee pain. 1 each 0   naproxen (NAPROSYN) 375 MG tablet Take 1 tablet (375 mg total) by mouth 2 (two) times daily as needed for moderate pain. 180 tablet 0   venlafaxine (EFFEXOR) 75 MG tablet Take 1 tablet (75 mg  total) by mouth 3 (three) times daily with meals. (Patient taking differently: Take 75 mg by mouth. 4 times daily with meals) 90 tablet 3   No facility-administered medications prior to visit.     Per HPI unless specifically indicated in ROS section below Review of Systems  Constitutional:  Positive for fatigue (Not able to use CPAP becaus she will cough). Negative for chills and fever.  HENT:  Positive for congestion, postnasal drip and rhinorrhea. Negative for ear pain, sinus pressure, sinus pain, sneezing and sore throat.   Respiratory:   Positive for cough. Negative for shortness of breath.   Cardiovascular:  Negative for chest pain.  Gastrointestinal:  Negative for abdominal pain, constipation, diarrhea, nausea and vomiting.  Neurological:  Negative for headaches.  Objective:  Ht 5\' 7"  (1.702 m)   Wt 205 lb (93 kg)   LMP 02/13/2014 (Within Days)   BMI 32.11 kg/m   Wt Readings from Last 3 Encounters:  05/13/21 205 lb (93 kg)  02/11/21 211 lb 8 oz (95.9 kg)  01/01/21 214 lb (97.1 kg)       Physical exam: Gen: alert, NAD, not ill appearing Pulm: speaks in complete sentences without increased work of breathing Psych: normal mood, normal thought content      Results for orders placed or performed in visit on 01/01/21  POCT glycosylated hemoglobin (Hb A1C)  Result Value Ref Range   Hemoglobin A1C 6.3 (A) 4.0 - 5.6 %   HbA1c POC (<> result, manual entry)     HbA1c, POC (prediabetic range)     HbA1c, POC (controlled diabetic range)     Assessment & Plan:   Problem List Items Addressed This Visit       Other   Subacute cough    Patient still having a cough albeit symptoms have improved.  States she has not been able to get anything up with the cough.  Did state the Tessalon Perles helped prescribed last month.  We will give her another round of that for a week also write her some Mucinex and Flonase to help.  Continue to monitor follow-up as needed.  Signs and symptoms reviewed as when to seek emergent or urgent health care.      Relevant Medications   guaiFENesin (MUCINEX) 600 MG 12 hr tablet   benzonatate (TESSALON) 200 MG capsule   PND (post-nasal drip) - Primary    Having some postnasal drip that I think is leading to some of the patient's cough.  We will send in some Flonase.  She is already on hydroxyzine 4 times daily so we will avoid adding another antihistamine at current time.  Continue to monitor.      Relevant Medications   fluticasone (FLONASE) 50 MCG/ACT nasal spray     No orders of the  defined types were placed in this encounter.  No orders of the defined types were placed in this encounter.   I discussed the assessment and treatment plan with the patient. The patient was provided an opportunity to ask questions and all were answered. The patient agreed with the plan and demonstrated an understanding of the instructions. The patient was advised to call back or seek an in-person evaluation if the symptoms worsen or if the condition fails to improve as anticipated.  Follow up plan: No follow-ups on file.  01/03/21, NP
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
## 2021-05-22 ENCOUNTER — Telehealth: Payer: Self-pay | Admitting: Nurse Practitioner

## 2021-05-22 ENCOUNTER — Other Ambulatory Visit: Payer: Self-pay | Admitting: Nurse Practitioner

## 2021-05-22 NOTE — Telephone Encounter (Signed)
Can we call and get some more information on her symptoms. I saw her on 05/13/2021 for the cough.  Is it getting better or worse? Anything coming up? If so is it colored or thick  Thanks

## 2021-05-22 NOTE — Telephone Encounter (Signed)
Pt called stating she had about 8-10 benzonatate (TESSALON) 200 MG capsule left and has misplaced. She is still coughing and requesting additional doses be sent to pharmacy for her.   [facility] [address] [place], [place] - [address] Phone:  [phone]  Fax:  [phone]

## 2021-05-23 ENCOUNTER — Other Ambulatory Visit: Payer: Self-pay | Admitting: Nurse Practitioner

## 2021-05-23 MED ORDER — BENZONATATE 200 MG PO CAPS: 200.0000 mg | ORAL_CAPSULE | Freq: Two times a day (BID) | ORAL | 0 refills | Status: AC | PRN

## 2021-05-23 NOTE — Telephone Encounter (Signed)
5 days worth of medication sent in since she misplace previous prescription

## 2021-05-23 NOTE — Telephone Encounter (Signed)
Spoke with patient. Cough is about the same, clear phlegm at times and sometimes dry, gets in coughing spells and can not stop. No SOB, no fever and no new symptoms or any other symptoms. Benzonatate medication was helping. Would like to get more due to misplacing the original bottle.

## 2021-05-23 NOTE — Telephone Encounter (Signed)
Patient advised.

## 2021-06-17 ENCOUNTER — Encounter (HOSPITAL_COMMUNITY): Admitting: Psychiatry

## 2021-06-18 ENCOUNTER — Ambulatory Visit: Admitting: Emergency Medicine

## 2021-06-21 ENCOUNTER — Encounter: Payer: Self-pay | Admitting: Family Medicine

## 2021-06-21 ENCOUNTER — Other Ambulatory Visit: Payer: Self-pay

## 2021-06-21 ENCOUNTER — Telehealth (INDEPENDENT_AMBULATORY_CARE_PROVIDER_SITE_OTHER): Admitting: Family Medicine

## 2021-06-21 DIAGNOSIS — J069 Acute upper respiratory infection, unspecified: Secondary | ICD-10-CM
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
## 2021-06-26 ENCOUNTER — Other Ambulatory Visit: Payer: Self-pay | Admitting: Family

## 2021-06-26 ENCOUNTER — Encounter (HOSPITAL_COMMUNITY): Admitting: Psychiatry

## 2021-07-04 ENCOUNTER — Encounter: Payer: Self-pay | Admitting: Emergency Medicine

## 2021-07-04 ENCOUNTER — Other Ambulatory Visit: Payer: Self-pay | Admitting: Emergency Medicine

## 2021-07-04 ENCOUNTER — Ambulatory Visit (INDEPENDENT_AMBULATORY_CARE_PROVIDER_SITE_OTHER): Admitting: Emergency Medicine

## 2021-07-04 ENCOUNTER — Other Ambulatory Visit: Payer: Self-pay

## 2021-07-04 ENCOUNTER — Telehealth: Payer: Self-pay | Admitting: Emergency Medicine

## 2021-07-04 ENCOUNTER — Ambulatory Visit: Admitting: Emergency Medicine

## 2021-07-04 VITALS — BP 126/82 | HR 72 | Ht 67.0 in | Wt 210.0 lb

## 2021-07-04 DIAGNOSIS — E785 Hyperlipidemia, unspecified: Secondary | ICD-10-CM | POA: Diagnosis not present

## 2021-07-04 DIAGNOSIS — I152 Hypertension secondary to endocrine disorders: Secondary | ICD-10-CM

## 2021-07-04 DIAGNOSIS — B379 Candidiasis, unspecified: Secondary | ICD-10-CM

## 2021-07-04 DIAGNOSIS — L309 Dermatitis, unspecified: Secondary | ICD-10-CM | POA: Diagnosis not present

## 2021-07-04 DIAGNOSIS — E1159 Type 2 diabetes mellitus with other circulatory complications: Secondary | ICD-10-CM | POA: Diagnosis not present

## 2021-07-04 DIAGNOSIS — E1169 Type 2 diabetes mellitus with other specified complication: Secondary | ICD-10-CM

## 2021-07-04 DIAGNOSIS — K0889 Other specified disorders of teeth and supporting structures: Secondary | ICD-10-CM | POA: Diagnosis not present

## 2021-07-04 DIAGNOSIS — Z720 Tobacco use: Secondary | ICD-10-CM

## 2021-07-04 LAB — COMPREHENSIVE METABOLIC PANEL
ALT: 9 U/L (ref 0–35)
AST: 13 U/L (ref 0–37)
Albumin: 3.8 g/dL (ref 3.5–5.2)
Alkaline Phosphatase: 84 U/L (ref 39–117)
BUN: 11 mg/dL (ref 6–23)
CO2: 29 mEq/L (ref 19–32)
Calcium: 9.3 mg/dL (ref 8.4–10.5)
Chloride: 102 mEq/L (ref 96–112)
Creatinine, Ser: 0.86 mg/dL (ref 0.40–1.20)
GFR: 73.29 mL/min (ref >60.00)
Glucose, Bld: 115 mg/dL — ABNORMAL HIGH (ref 70–99)
Potassium: 3.9 mEq/L (ref 3.5–5.1)
Sodium: 138 mEq/L (ref 135–145)
Total Bilirubin: 0.2 mg/dL (ref 0.2–1.2)
Total Protein: 7 g/dL (ref 6.0–8.3)

## 2021-07-04 LAB — LIPID PANEL
Cholesterol: 113 mg/dL (ref 0–200)
HDL: 57.1 mg/dL (ref >39.00)
LDL Cholesterol: 28 mg/dL (ref 0–99)
NonHDL: 56.29
Total CHOL/HDL Ratio: 2
Triglycerides: 142 mg/dL (ref 0.0–149.0)
VLDL: 28.4 mg/dL (ref 0.0–40.0)

## 2021-07-04 LAB — HEMOGLOBIN A1C: Hgb A1c MFr Bld: 7.3 % — ABNORMAL HIGH (ref 4.6–6.5)

## 2021-07-04 MED ORDER — FLUCONAZOLE 150 MG PO TABS: 150.0000 mg | ORAL_TABLET | Freq: Once | ORAL | 0 refills | Status: AC

## 2021-07-04 MED ORDER — METFORMIN HCL 500 MG PO TABS: 500.0000 mg | ORAL_TABLET | Freq: Two times a day (BID) | ORAL | 3 refills | Status: DC

## 2021-07-04 MED ORDER — AMOXICILLIN-POT CLAVULANATE 875-125 MG PO TABS: 1.0000 | ORAL_TABLET | Freq: Two times a day (BID) | ORAL | 0 refills | Status: AC

## 2021-07-04 NOTE — Assessment & Plan Note (Signed)
Cardiovascular and cancer risk associated with smoking discussed. Smoking cessation advice given. 

## 2021-07-04 NOTE — Assessment & Plan Note (Signed)
Diet and nutrition discussed.  Continue atorvastatin 10 mg daily. 

## 2021-07-04 NOTE — Telephone Encounter (Signed)
Patient came back up after appointment stating she can not take the antibiotic that was called in amoxicillin-clavuante because it gives her a yeast infection.

## 2021-07-04 NOTE — Telephone Encounter (Signed)
Prescription for Diflucan sent.

## 2021-07-04 NOTE — Assessment & Plan Note (Signed)
May have periodontal infection.  We will start Augmentin twice a day for 7 days. Must follow-up with dentist as soon as possible.

## 2021-07-04 NOTE — Telephone Encounter (Signed)
Called and spoke with pt

## 2021-07-04 NOTE — Progress Notes (Signed)
Stephanie Joseph 60 y.o.   Chief Complaint  Patient presents with   Follow-up    Chronic medication condtions, pt would to discuss dry hands and tooth ache   Medication Refill    Metformin, [facility]    HISTORY OF PRESENT ILLNESS: This is a 60 y.o. female complaining of with history of diabetes and hypertension here for follow-up. Also complaining of tooth ache and dry hands. No other complaint or medical concerns today. Lab Results  Component Value Date   HGBA1C 6.3 (A) 01/01/2021   BP Readings from Last 3 Encounters:  07/04/21 126/82  04/18/21 (!) 144/80  02/11/21 131/80     Medication Refill Associated symptoms include a rash (Right hand fingers). Pertinent negatives include no abdominal pain, chest pain, chills, congestion, coughing, fever, headaches, nausea, sore throat or vomiting.    Prior to Admission medications   Medication Sig Start Date End Date Taking? Authorizing Provider  amLODipine (NORVASC) 10 MG tablet Take 1 tablet (10 mg total) by mouth daily. 05/03/21  Yes Eveny Anastas, Ines Bloomer, MD  atorvastatin (LIPITOR) 10 MG tablet Take 1 tablet (10 mg total) by mouth daily. 05/03/21  Yes Eyonna Sandstrom, Ines Bloomer, MD  brimonidine-timolol (COMBIGAN) 0.2-0.5 % ophthalmic solution Place 1 drop into both eyes every 12 (twelve) hours.   Yes [provider]  busPIRone (BUSPAR) 10 MG tablet Take 1 tablet (10 mg total) by mouth 3 (three) times daily. 03/12/21  Yes Eulis Canner E, NP  fluticasone (FLONASE) 50 MCG/ACT nasal spray Place 2 sprays into both nostrils daily. 05/13/21  Yes Michela Pitcher, NP  glimepiride (AMARYL) 2 MG tablet TAKE ONE TABLET BY MOUTH EVERY DAY WITH BREAKFAST. 06/26/21  Yes Damyn Weitzel, Ines Bloomer, MD  glucose blood (ACCU-CHEK AVIVA) test strip Use to check blood sugars 1-3 times daily. Dx Code: E11.9 01/20/18  Yes Marrian Salvage, FNP  hydrOXYzine (ATARAX/VISTARIL) 25 MG tablet Take 1 tablet (25 mg total) by mouth every 8  (eight) hours as needed for itching. 03/12/21  Yes Eulis Canner E, NP  Lancets (ACCU-CHEK SOFT TOUCH) lancets Use as instructed 05/21/17  Yes Shambley, Delphia Grates, NP  losartan-hydrochlorothiazide (HYZAAR) 100-12.5 MG tablet Take 1 tablet by mouth daily. 05/03/21  Yes Horald Pollen, MD  metFORMIN (GLUCOPHAGE) 500 MG tablet TAKE 1 TABLET BY MOUTH TWICE DAILY WITH A MEAL 10/31/20  Yes Marrian Salvage, FNP  methocarbamol (ROBAXIN) 500 MG tablet TAKE ONE TABLET BY MOUTH EVERY 8 HOURS AS NEEDED FOR MUSCLE SPASMS 01/27/20  Yes Marrian Salvage, [place]. Devices (CANE) MISC Use as needed for left knee pain. 01/29/18  Yes Shambley, Delphia Grates, NP  naproxen (NAPROSYN) 375 MG tablet Take 1 tablet (375 mg total) by mouth 2 (two) times daily as needed for moderate pain. 07/13/20  Yes Marrian Salvage, FNP  venlafaxine (EFFEXOR) 75 MG tablet Take 1 tablet (75 mg total) by mouth 3 (three) times daily with meals. Patient taking differently: Take 75 mg by mouth. 4 times daily with meals 03/12/21  Yes Eulis Canner E, NP  albuterol (VENTOLIN HFA) 108 (90 Base) MCG/ACT inhaler Inhale 2 puffs into the lungs every 6 (six) hours as needed for wheezing or shortness of breath. 04/18/21   Michela Pitcher, NP    No Known Allergies  Patient Active Problem List   Diagnosis Date Noted   PND (post-nasal drip) 05/13/2021   Upper respiratory tract infection 04/18/2021   Subacute cough 04/18/2021   Shortness of breath 04/18/2021   Chronic  midline low back pain 07/12/2018   Glaucoma 05/21/2017   Class 1 obesity due to excess calories with serious comorbidity and body mass index (BMI) of 33.0 to 33.9 in adult 09/19/2016   Vitamin D deficiency 09/19/2016   Dyslipidemia associated with type 2 diabetes mellitus (HCC) 07/10/2015   Hormone replacement therapy (postmenopausal) 10/04/2014   Mitral regurgitation 11/05/2012   Valvular heart disease 10/21/2011   Menorrhagia, premenopausal 09/01/2011    Anxiety and depression 03/04/2010   GOUT 12/16/2006   ANEMIA, IRON DEFICIENCY NOS 12/16/2006   Tobacco abuse 12/16/2006   Hypertension associated with diabetes (HCC) 12/16/2006    Past Medical History:  Diagnosis Date   Anemia, iron deficiency    Arthritis    Depression    Diabetes mellitus without complication (HCC)    Dysmenorrhea    Gout    Heart valve problem    Hypertension    Menorrhagia    Mitral regurgitation    Pneumonia     Past Surgical History:  Procedure Laterality Date   KNEE ARTHROSCOPY     Left    Social History   Socioeconomic History   Marital status: Married    Spouse name: Not on file   Number of children: 1   Years of education: 12   Highest education level: Not on file  Occupational History   Occupation: Disability Pending  Tobacco Use   Smoking status: Every Day    Packs/day: 0.50    Years: 7.00    Pack years: 3.50    Types: Cigarettes   Smokeless tobacco: Never  Vaping Use   Vaping Use: Never used  Substance and Sexual Activity   Alcohol use: No   Drug use: No    Types: "Crack" cocaine   Sexual activity: Yes    Birth control/protection: None  Other Topics Concern   Not on file  Social History Narrative   Fun: Go to church   Denies abuse and feels safe at home.    Social Determinants of Health   Financial Resource Strain: Not on file  Food Insecurity: Not on file  Transportation Needs: Not on file  Physical Activity: Not on file  Stress: Not on file  Social Connections: Not on file  Intimate Partner Violence: Not on file    Family History  Problem Relation Age of Onset   Hypertension Mother    Stroke Paternal Grandmother    Heart disease Sister    Heart disease Brother      Review of Systems  Constitutional: Negative.  Negative for chills and fever.  HENT: Negative.  Negative for congestion and sore throat.        Tooth ache  Eyes: Negative.   Respiratory: Negative.  Negative for cough and shortness of breath.    Cardiovascular: Negative.  Negative for chest pain and palpitations.  Gastrointestinal: Negative.  Negative for abdominal pain, diarrhea, nausea and vomiting.  Genitourinary: Negative.  Negative for dysuria and hematuria.  Musculoskeletal: Negative.   Skin:  Positive for rash (Right hand fingers).  Neurological: Negative.  Negative for dizziness and headaches.  All other systems reviewed and are negative.  Today's Vitals   07/04/21 1359  BP: 126/82  Pulse: 72  SpO2: 97%  Weight: 210 lb (95.3 kg)  Height: 5\' 7"  (1.702 m)   Body mass index is 32.89 kg/m. Wt Readings from Last 3 Encounters:  07/04/21 210 lb (95.3 kg)  05/13/21 205 lb (93 kg)  02/11/21 211 lb 8 oz (95.9 kg)  Physical Exam Vitals reviewed.  Constitutional:      Appearance: Normal appearance.  HENT:     Head: Normocephalic.     Mouth/Throat:     Comments: Positive tenderness and erythema to left lower molar area where tooth was removed in the past Eyes:     Extraocular Movements: Extraocular movements intact.     Pupils: Pupils are equal, round, and reactive to light.  Cardiovascular:     Rate and Rhythm: Normal rate and regular rhythm.     Heart sounds: Normal heart sounds.  Pulmonary:     Effort: Pulmonary effort is normal.     Breath sounds: Normal breath sounds.  Musculoskeletal:     Cervical back: No tenderness.     Right lower leg: No edema.     Left lower leg: No edema.  Lymphadenopathy:     Cervical: No cervical adenopathy.  Skin:    General: Skin is warm and dry.     Capillary Refill: Capillary refill takes less than 2 seconds.     Findings: Rash (Eczema to right index and ring fingers palmar aspects) present.  Neurological:     General: No focal deficit present.     Mental Status: She is alert and oriented to person, place, and time.  Psychiatric:        Mood and Affect: Mood normal.        Behavior: Behavior normal.     ASSESSMENT & PLAN: Problem List Items Addressed This Visit        Cardiovascular and Mediastinum   Hypertension associated with diabetes ([place]) - Primary    Well-controlled hypertension.  Continue amlodipine 10 mg and Hyzaar 100-12.5 mg daily. BP Readings from Last 3 Encounters:  07/04/21 126/82  04/18/21 (!) 144/80  02/11/21 131/80  Well-controlled diabetes.  Hemoglobin A1c done today. Continue metformin and glimepiride 2 mg daily. Diet and nutrition discussed. Follow-up in 6 months.       Relevant Medications   metFORMIN (GLUCOPHAGE) 500 MG tablet   Other Relevant Orders   Comprehensive metabolic panel   Hemoglobin A1c     Endocrine   Dyslipidemia associated with type 2 diabetes mellitus ([place])    Diet and nutrition discussed.  Continue atorvastatin 10 mg daily.       Relevant Medications   metFORMIN (GLUCOPHAGE) 500 MG tablet   Other Relevant Orders   Lipid panel     Other   Tobacco abuse    Cardiovascular and cancer risk associated with smoking discussed. Smoking cessation advice given.      Tooth ache    May have periodontal infection.  We will start Augmentin twice a day for 7 days. Must follow-up with dentist as soon as possible.      Other Visit Diagnoses     Eczema, unspecified type       Finger      Patient Instructions  Start Augmentin twice a day for possible periodontal infection and follow-up with your dentist as soon as possible.  Take Advil and/or Tylenol for pain as needed. Use over-the-counter hydrocortisone cream for eczema of fingers. Health Maintenance, Female Adopting a healthy lifestyle and getting preventive care are important in promoting health and wellness. Ask your health care provider about: The right schedule for you to have regular tests and exams. Things you can do on your own to prevent diseases and keep yourself healthy. What should I know about diet, weight, and exercise? Eat a healthy diet  Eat a diet that includes  plenty of vegetables, fruits, low-fat dairy products, and lean  protein. Do not eat a lot of foods that are high in solid fats, added sugars, or sodium. Maintain a healthy weight Body mass index (BMI) is used to identify weight problems. It estimates body fat based on height and weight. Your health care provider can help determine your BMI and help you achieve or maintain a healthy weight. Get regular exercise Get regular exercise. This is one of the most important things you can do for your health. Most adults should: Exercise for at least 150 minutes each week. The exercise should increase your heart rate and make you sweat (moderate-intensity exercise). Do strengthening exercises at least twice a week. This is in addition to the moderate-intensity exercise. Spend less time sitting. Even light physical activity can be beneficial. Watch cholesterol and blood lipids Have your blood tested for lipids and cholesterol at 60 years of age, then have this test every 5 years. Have your cholesterol levels checked more often if: Your lipid or cholesterol levels are high. You are older than 60 years of age. You are at high risk for heart disease. What should I know about cancer screening? Depending on your health history and family history, you may need to have cancer screening at various ages. This may include screening for: Breast cancer. Cervical cancer. Colorectal cancer. Skin cancer. Lung cancer. What should I know about heart disease, diabetes, and high blood pressure? Blood pressure and heart disease High blood pressure causes heart disease and increases the risk of stroke. This is more likely to develop in people who have high blood pressure readings or are overweight. Have your blood pressure checked: Every 3-5 years if you are 5-9 years of age. Every year if you are 71 years old or older. Diabetes Have regular diabetes screenings. This checks your fasting blood sugar level. Have the screening done: Once every three years after age 20 if you are at  a normal weight and have a low risk for diabetes. More often and at a younger age if you are overweight or have a high risk for diabetes. What should I know about preventing infection? Hepatitis B If you have a higher risk for hepatitis B, you should be screened for this virus. Talk with your health care provider to find out if you are at risk for hepatitis B infection. Hepatitis C Testing is recommended for: Everyone born from [birth year] through 1965. Anyone with known risk factors for hepatitis C. Sexually transmitted infections (STIs) Get screened for STIs, including gonorrhea and chlamydia, if: You are sexually active and are younger than 60 years of age. You are older than 60 years of age and your health care provider tells you that you are at risk for this type of infection. Your sexual activity has changed since you were last screened, and you are at increased risk for chlamydia or gonorrhea. Ask your health care provider if you are at risk. Ask your health care provider about whether you are at high risk for HIV. Your health care provider may recommend a prescription medicine to help prevent HIV infection. If you choose to take medicine to prevent HIV, you should first get tested for HIV. You should then be tested every 3 months for as long as you are taking the medicine. Pregnancy If you are about to stop having your period (premenopausal) and you may become pregnant, seek counseling before you get pregnant. Take 400 to 800 micrograms (mcg) of folic acid every day  if you become pregnant. Ask for birth control (contraception) if you want to prevent pregnancy. Osteoporosis and menopause Osteoporosis is a disease in which the bones lose minerals and strength with aging. This can result in bone fractures. If you are 72 years old or older, or if you are at risk for osteoporosis and fractures, ask your health care provider if you should: Be screened for bone loss. Take a calcium or vitamin D  supplement to lower your risk of fractures. Be given hormone replacement therapy (HRT) to treat symptoms of menopause. Follow these instructions at home: Alcohol use Do not drink alcohol if: Your health care provider tells you not to drink. You are pregnant, may be pregnant, or are planning to become pregnant. If you drink alcohol: Limit how much you have to: 0-1 drink a day. Know how much alcohol is in your drink. In the [place] one drink equals one 12 oz bottle of beer (355 mL), one 5 oz glass of wine (148 mL), or one 1 oz glass of hard liquor (44 mL). Lifestyle Do not use any products that contain nicotine or tobacco. These products include cigarettes, chewing tobacco, and vaping devices, such as e-cigarettes. If you need help quitting, ask your health care provider. Do not use street drugs. Do not share needles. Ask your health care provider for help if you need support or information about quitting drugs. General instructions Schedule regular health, dental, and eye exams. Stay current with your vaccines. Tell your health care provider if: You often feel depressed. You have ever been abused or do not feel safe at home. Summary Adopting a healthy lifestyle and getting preventive care are important in promoting health and wellness. Follow your health care provider's instructions about healthy diet, exercising, and getting tested or screened for diseases. Follow your health care provider's instructions on monitoring your cholesterol and blood pressure. This information is not intended to replace advice given to you by your health care provider. Make sure you discuss any questions you have with your health care provider. Document Revised: 11/12/2020 Document Reviewed: 11/12/2020 Elsevier Patient Education  2022 [place], MD [facility]

## 2021-07-04 NOTE — Patient Instructions (Signed)
Start Augmentin twice a day for possible periodontal infection and follow-up with your dentist as soon as possible.  Take Advil and/or Tylenol for pain as needed. Use over-the-counter hydrocortisone cream for eczema of fingers. Health Maintenance, Female Adopting a healthy lifestyle and getting preventive care are important in promoting health and wellness. Ask your health care provider about: The right schedule for you to have regular tests and exams. Things you can do on your own to prevent diseases and keep yourself healthy. What should I know about diet, weight, and exercise? Eat a healthy diet  Eat a diet that includes plenty of vegetables, fruits, low-fat dairy products, and lean protein. Do not eat a lot of foods that are high in solid fats, added sugars, or sodium. Maintain a healthy weight Body mass index (BMI) is used to identify weight problems. It estimates body fat based on height and weight. Your health care provider can help determine your BMI and help you achieve or maintain a healthy weight. Get regular exercise Get regular exercise. This is one of the most important things you can do for your health. Most adults should: Exercise for at least 150 minutes each week. The exercise should increase your heart rate and make you sweat (moderate-intensity exercise). Do strengthening exercises at least twice a week. This is in addition to the moderate-intensity exercise. Spend less time sitting. Even light physical activity can be beneficial. Watch cholesterol and blood lipids Have your blood tested for lipids and cholesterol at 60 years of age, then have this test every 5 years. Have your cholesterol levels checked more often if: Your lipid or cholesterol levels are high. You are older than 60 years of age. You are at high risk for heart disease. What should I know about cancer screening? Depending on your health history and family history, you may need to have cancer screening at  various ages. This may include screening for: Breast cancer. Cervical cancer. Colorectal cancer. Skin cancer. Lung cancer. What should I know about heart disease, diabetes, and high blood pressure? Blood pressure and heart disease High blood pressure causes heart disease and increases the risk of stroke. This is more likely to develop in people who have high blood pressure readings or are overweight. Have your blood pressure checked: Every 3-5 years if you are 87-37 years of age. Every year if you are 71 years old or older. Diabetes Have regular diabetes screenings. This checks your fasting blood sugar level. Have the screening done: Once every three years after age 37 if you are at a normal weight and have a low risk for diabetes. More often and at a younger age if you are overweight or have a high risk for diabetes. What should I know about preventing infection? Hepatitis B If you have a higher risk for hepatitis B, you should be screened for this virus. Talk with your health care provider to find out if you are at risk for hepatitis B infection. Hepatitis C Testing is recommended for: Everyone born from [birth year] through 1965. Anyone with known risk factors for hepatitis C. Sexually transmitted infections (STIs) Get screened for STIs, including gonorrhea and chlamydia, if: You are sexually active and are younger than 60 years of age. You are older than 60 years of age and your health care provider tells you that you are at risk for this type of infection. Your sexual activity has changed since you were last screened, and you are at increased risk for chlamydia or gonorrhea. Ask your  health care provider if you are at risk. Ask your health care provider about whether you are at high risk for HIV. Your health care provider may recommend a prescription medicine to help prevent HIV infection. If you choose to take medicine to prevent HIV, you should first get tested for HIV. You should then be  tested every 3 months for as long as you are taking the medicine. Pregnancy If you are about to stop having your period (premenopausal) and you may become pregnant, seek counseling before you get pregnant. Take 400 to 800 micrograms (mcg) of folic acid every day if you become pregnant. Ask for birth control (contraception) if you want to prevent pregnancy. Osteoporosis and menopause Osteoporosis is a disease in which the bones lose minerals and strength with aging. This can result in bone fractures. If you are 89 years old or older, or if you are at risk for osteoporosis and fractures, ask your health care provider if you should: Be screened for bone loss. Take a calcium or vitamin D supplement to lower your risk of fractures. Be given hormone replacement therapy (HRT) to treat symptoms of menopause. Follow these instructions at home: Alcohol use Do not drink alcohol if: Your health care provider tells you not to drink. You are pregnant, may be pregnant, or are planning to become pregnant. If you drink alcohol: Limit how much you have to: 0-1 drink a day. Know how much alcohol is in your drink. In the [place] one drink equals one 12 oz bottle of beer (355 mL), one 5 oz glass of wine (148 mL), or one 1 oz glass of hard liquor (44 mL). Lifestyle Do not use any products that contain nicotine or tobacco. These products include cigarettes, chewing tobacco, and vaping devices, such as e-cigarettes. If you need help quitting, ask your health care provider. Do not use street drugs. Do not share needles. Ask your health care provider for help if you need support or information about quitting drugs. General instructions Schedule regular health, dental, and eye exams. Stay current with your vaccines. Tell your health care provider if: You often feel depressed. You have ever been abused or do not feel safe at home. Summary Adopting a healthy lifestyle and getting preventive care are important in  promoting health and wellness. Follow your health care provider's instructions about healthy diet, exercising, and getting tested or screened for diseases. Follow your health care provider's instructions on monitoring your cholesterol and blood pressure. This information is not intended to replace advice given to you by your health care provider. Make sure you discuss any questions you have with your health care provider. Document Revised: 11/12/2020 Document Reviewed: 11/12/2020 Elsevier Patient Education  2022 [employer].

## 2021-07-04 NOTE — Assessment & Plan Note (Signed)
Well-controlled hypertension.  Continue amlodipine 10 mg and Hyzaar 100-12.5 mg daily. BP Readings from Last 3 Encounters:  07/04/21 126/82  04/18/21 (!) 144/80  02/11/21 131/80  Well-controlled diabetes.  Hemoglobin A1c done today. Continue metformin and glimepiride 2 mg daily. Diet and nutrition discussed. Follow-up in 6 months.

## 2021-07-11 ENCOUNTER — Ambulatory Visit (HOSPITAL_COMMUNITY): Admitting: Clinical

## 2021-07-16 ENCOUNTER — Encounter (HOSPITAL_COMMUNITY): Payer: Self-pay | Admitting: Psychiatry

## 2021-07-16 ENCOUNTER — Telehealth (INDEPENDENT_AMBULATORY_CARE_PROVIDER_SITE_OTHER): Admitting: Psychiatry

## 2021-07-16 DIAGNOSIS — F330 Major depressive disorder, recurrent, mild: Secondary | ICD-10-CM | POA: Diagnosis not present

## 2021-07-16 DIAGNOSIS — F411 Generalized anxiety disorder: Secondary | ICD-10-CM | POA: Diagnosis not present

## 2021-07-16 MED ORDER — HYDROXYZINE HCL 25 MG PO TABS: 25.0000 mg | ORAL_TABLET | Freq: Three times a day (TID) | ORAL | 1 refills | Status: DC | PRN

## 2021-07-16 MED ORDER — QUETIAPINE FUMARATE 150 MG PO TABS
100.0000 mg | ORAL_TABLET | Freq: Every day | ORAL | 1 refills | Status: DC
Start: 2021-07-16 — End: 2021-09-26

## 2021-07-16 MED ORDER — BUSPIRONE HCL 10 MG PO TABS: 10.0000 mg | ORAL_TABLET | Freq: Three times a day (TID) | ORAL | 1 refills | Status: DC

## 2021-07-16 MED ORDER — VENLAFAXINE HCL 75 MG PO TABS: 75.0000 mg | ORAL_TABLET | Freq: Three times a day (TID) | ORAL | 1 refills | Status: DC

## 2021-07-16 NOTE — Progress Notes (Signed)
BH MD/PA/NP OP Progress Note Virtual Visit via Video Note  I connected with Stephanie Joseph on 07/16/21 at 10:30 AM EST by a video enabled telemedicine application and verified that I am speaking with the correct person using two identifiers.  Location: Patient: Home Provider: Clinic   I discussed the limitations of evaluation and management by telemedicine and the availability of in person appointments. The patient expressed understanding and agreed to proceed.  I provided 30 minutes of non-face-to-face time during this encounter.      07/16/2021 10:51 AM Stephanie Joseph  MRN:  [id number]  Chief Complaint:  "Im not sleeping good because my back hurt"    HPI: 61 year old female seen today for follow up psychiatric evaluation.   She has a psychiatric history of depression, anxiety, alochol use (in remission), cannabis use disorder (in remission), and cocaine use disorder (in remission).  She is managed on hydroxyzine 25 mg every 8 hours as needed, Seroquel 300 nightly, Buspar 10 mg three times daily, and Effexor 75 mg three times daily. She informed Clinical research associatewriter she reduced her Seroquel to 150 mg and reports that her medications are effective in managing her psychiatric conditions.    Today she is well groomed, pleasant, cooperative, engaged in conversation, and maintained eye contact. She informed Clinical research associatewriter that since her last visit she has been feeling mentally stable.  She notes at times she has difficulty falling asleep due to back pain.  She informed Clinical research associatewriter that she plans to follow-up her orthopedic surgeon regarding this pain when she is able to financially afford it.  She informed Clinical research associatewriter that she takes naproxen to help manage the pain.  Overall patient notes her mood is stable and reports that she has minimal anxiety and depression.  Provider conducted a GAD-7 and patient scored a 7, at her last visit she scored a 17.  Provider also conducted PHQ-9 and patient scored a 6, at  her last visit she scored a 13.  She endorsed adequate appetite.  Today she denies SI/HI/VAH, mania, or paranoia.   Patient notes that she would like to continue Seroquel at 150 mg.  No other medication changes made today. Patient requested 5161-month supply of medication which provider was agreeable to. Patient will continue medications as prescribes.  She will follow-up with outpatient counseling for therapy.  No other concerns noted at this time.   Visit Diagnosis:    ICD-10-CM   1. Generalized anxiety disorder  F41.1 busPIRone (BUSPAR) 10 MG tablet    hydrOXYzine (ATARAX) 25 MG tablet    QUEtiapine 150 MG TABS    2. Mild episode of recurrent major depressive disorder (HCC)  F33.0 busPIRone (BUSPAR) 10 MG tablet    venlafaxine (EFFEXOR) 75 MG tablet    QUEtiapine 150 MG TABS      Past Psychiatric History:depression, anxiety, alochol use (in remission), cannabis use disorder (in remission), and cocaine use disorder (in remission) Past Medical History:  Past Medical History:  Diagnosis Date   Anemia, iron deficiency    Arthritis    Depression    Diabetes mellitus without complication (HCC)    Dysmenorrhea    Gout    Heart valve problem    Hypertension    Menorrhagia    Mitral regurgitation    Pneumonia     Past Surgical History:  Procedure Laterality Date   KNEE ARTHROSCOPY     Left    Family Psychiatric History: Brother schizophrenia (deceased)  Family History:  Family History  Problem  Relation Age of Onset   Hypertension Mother    Stroke Paternal Grandmother    Heart disease Sister    Heart disease Brother     Social History:  Social History   Socioeconomic History   Marital status: Married    Spouse name: Not on file   Number of children: 1   Years of education: 12   Highest education level: Not on file  Occupational History   Occupation: Disability Pending  Tobacco Use   Smoking status: Every Day    Packs/day: 0.50    Years: 7.00    Pack years: 3.50     Types: Cigarettes   Smokeless tobacco: Never  Vaping Use   Vaping Use: Never used  Substance and Sexual Activity   Alcohol use: No   Drug use: No    Types: "Crack" cocaine   Sexual activity: Yes    Birth control/protection: None  Other Topics Concern   Not on file  Social History Narrative   Fun: Go to church   Denies abuse and feels safe at home.    Social Determinants of Health   Financial Resource Strain: Not on file  Food Insecurity: Not on file  Transportation Needs: Not on file  Physical Activity: Not on file  Stress: Not on file  Social Connections: Not on file    Allergies: No Known Allergies  Metabolic Disorder Labs: Lab Results  Component Value Date   HGBA1C 7.3 (H) 07/04/2021   MPG 197 01/27/2020   No results found for: PROLACTIN Lab Results  Component Value Date   CHOL 113 07/04/2021   TRIG 142.0 07/04/2021   HDL 57.10 07/04/2021   CHOLHDL 2 07/04/2021   VLDL 28.4 07/04/2021   LDLCALC 28 07/04/2021   LDLCALC 31 01/27/2020   Lab Results  Component Value Date   TSH 0.59 05/10/2019   TSH 0.55 09/16/2017    Therapeutic Level Labs: No results found for: LITHIUM No results found for: VALPROATE No components found for:  CBMZ  Current Medications: Current Outpatient Medications  Medication Sig Dispense Refill   QUEtiapine 150 MG TABS Take 100 mg by mouth at bedtime. 90 tablet 1   amLODipine (NORVASC) 10 MG tablet Take 1 tablet (10 mg total) by mouth daily. 90 tablet 3   atorvastatin (LIPITOR) 10 MG tablet Take 1 tablet (10 mg total) by mouth daily. 90 tablet 3   brimonidine-timolol (COMBIGAN) 0.2-0.5 % ophthalmic solution Place 1 drop into both eyes every 12 (twelve) hours.     busPIRone (BUSPAR) 10 MG tablet Take 1 tablet (10 mg total) by mouth 3 (three) times daily. 270 tablet 1   fluticasone (FLONASE) 50 MCG/ACT nasal spray Place 2 sprays into both nostrils daily. 16 g 6   glimepiride (AMARYL) 2 MG tablet TAKE ONE TABLET BY MOUTH EVERY DAY  WITH BREAKFAST. 90 tablet 1   glucose blood (ACCU-CHEK AVIVA) test strip Use to check blood sugars 1-3 times daily. Dx Code: E11.9 300 each 1   hydrOXYzine (ATARAX) 25 MG tablet Take 1 tablet (25 mg total) by mouth every 8 (eight) hours as needed for itching. 360 tablet 1   Lancets (ACCU-CHEK SOFT TOUCH) lancets Use as instructed 100 each 12   losartan-hydrochlorothiazide (HYZAAR) 100-12.5 MG tablet Take 1 tablet by mouth daily. 90 tablet 3   metFORMIN (GLUCOPHAGE) 500 MG tablet Take 1 tablet (500 mg total) by mouth 2 (two) times daily with a meal. 180 tablet 3   methocarbamol (ROBAXIN) 500 MG tablet TAKE  ONE TABLET BY MOUTH EVERY 8 HOURS AS NEEDED FOR MUSCLE SPASMS 90 tablet 3   Misc. Devices (CANE) MISC Use as needed for left knee pain. 1 each 0   naproxen (NAPROSYN) 375 MG tablet Take 1 tablet (375 mg total) by mouth 2 (two) times daily as needed for moderate pain. 180 tablet 0   venlafaxine (EFFEXOR) 75 MG tablet Take 1 tablet (75 mg total) by mouth 3 (three) times daily with meals. 270 tablet 1   No current facility-administered medications for this visit.     Musculoskeletal: Strength & Muscle Tone:  Unable to assess due to telehealth visit Gait & Station:  Unable to assess due to telehealth visit Patient leans: N/A  Psychiatric Specialty Exam: Review of Systems  Last menstrual period 02/13/2014.There is no height or weight on file to calculate BMI.  General Appearance: Well Groomed  Eye Contact:  Good  Speech:  Clear and Coherent and Normal Rate  Volume:  Normal  Mood:  Euthymic  Affect:  Congruent  Thought Process:  Coherent, Goal Directed and Linear  Orientation:  Full (Time, Place, and Person)  Thought Content: WDL and Logical   Suicidal Thoughts:  No  Homicidal Thoughts:  No  Memory:  Immediate;   Good Recent;   Good Remote;   Good  Judgement:  Good  Insight:  Good  Psychomotor Activity:  Normal  Concentration:  Concentration: Good and Attention Span: Good  Recall:   Good  Fund of Knowledge: Good  Language: Good  Akathisia:  No  Handed:  Right  AIMS (if indicated): not done  Assets:  Communication Skills Desire for Improvement Financial Resources/Insurance Housing Social Support  ADL's:  Intact  Cognition: WNL  Sleep:  Fair   Screenings: GAD-7    Flowsheet Row [facility]/14[facility]2020 [facility]  Total GAD-7 Score 7 17 1 2 14       PHQ2-9    Flowsheet Row Video [facility]20[facility]2020 [facility]  PHQ-2 Total Score 4 0 4 0 1  PHQ-9 Total Score 6 -- 13 -- 3      Flowsheet Row Counselor [facility]13[facility]2021 [facility] Video [facility]2020 [facility]  C-SSRS RISK CATEGORY No Risk No Risk        Assessment and Plan: Patient notes at times she has difficulty sleeping due to back pain however informed writer that she is able to cope with it.  Patient notes that she would like to continue Seroquel at 150 mg.  No other medication changes made today.  Patient requested 8456-month supply of medication which provider was agreeable to.Patient will continue medications as prescribes.  1. Generalized anxiety disorder  Continue- busPIRone (BUSPAR) 10 MG tablet; Take 1 tablet (10 mg total) by mouth 3 (three) times daily.  Dispense: 270 tablet; Refill:  1 Continue- hydrOXYzine (ATARAX) 25 MG tablet; Take 1 tablet (25 mg total) by mouth every 8 (eight) hours as needed for  itching.  Dispense: 360 tablet; Refill: 1 Reduce- QUEtiapine 150 MG TABS; Take 100 mg by mouth at bedtime.  Dispense: 90 tablet; Refill: 1  2. Mild episode of recurrent major depressive disorder (HCC)  Continue- busPIRone (BUSPAR) 10 MG tablet; Take 1 tablet (10 mg total) by mouth 3 (three) times daily.  Dispense: 270 tablet; Refill: 1 Continue- venlafaxine (EFFEXOR) 75 MG tablet; Take 1 tablet (75 mg total) by mouth 3 (three) times daily with meals.  Dispense: 270 tablet; Refill: 1 Reduce- QUEtiapine 150 MG TABS; Take 100 mg by mouth at bedtime.  Dispense: 90 tablet; Refill: 1  Follow-up in 3 month Follow up with therapy  Shanna Cisco, NP 07/16/2021, 10:51 AM

## 2021-07-17 ENCOUNTER — Ambulatory Visit: Admitting: Emergency Medicine

## 2021-07-19 ENCOUNTER — Other Ambulatory Visit: Payer: Self-pay | Admitting: Emergency Medicine

## 2021-07-19 DIAGNOSIS — Z1231 Encounter for screening mammogram for malignant neoplasm of breast: Secondary | ICD-10-CM

## 2021-07-31 ENCOUNTER — Other Ambulatory Visit: Payer: Self-pay

## 2021-07-31 ENCOUNTER — Encounter: Payer: Self-pay | Admitting: Emergency Medicine

## 2021-07-31 ENCOUNTER — Ambulatory Visit (INDEPENDENT_AMBULATORY_CARE_PROVIDER_SITE_OTHER): Admitting: Emergency Medicine

## 2021-07-31 VITALS — BP 130/70 | HR 83 | Temp 98.6°F | Ht 67.0 in | Wt 206.0 lb

## 2021-07-31 DIAGNOSIS — R232 Flushing: Secondary | ICD-10-CM | POA: Diagnosis not present

## 2021-07-31 DIAGNOSIS — E1169 Type 2 diabetes mellitus with other specified complication: Secondary | ICD-10-CM | POA: Diagnosis not present

## 2021-07-31 DIAGNOSIS — E1159 Type 2 diabetes mellitus with other circulatory complications: Secondary | ICD-10-CM | POA: Diagnosis not present

## 2021-07-31 DIAGNOSIS — R42 Dizziness and giddiness: Secondary | ICD-10-CM | POA: Insufficient documentation

## 2021-07-31 DIAGNOSIS — E785 Hyperlipidemia, unspecified: Secondary | ICD-10-CM | POA: Diagnosis not present

## 2021-07-31 DIAGNOSIS — Z720 Tobacco use: Secondary | ICD-10-CM

## 2021-07-31 DIAGNOSIS — I152 Hypertension secondary to endocrine disorders: Secondary | ICD-10-CM

## 2021-07-31 DIAGNOSIS — Z0271 Encounter for disability determination: Secondary | ICD-10-CM

## 2021-07-31 DIAGNOSIS — Z79890 Hormone replacement therapy (postmenopausal): Secondary | ICD-10-CM

## 2021-07-31 LAB — COMPREHENSIVE METABOLIC PANEL
ALT: 9 U/L (ref 0–35)
AST: 12 U/L (ref 0–37)
Albumin: 4.1 g/dL (ref 3.5–5.2)
Alkaline Phosphatase: 82 U/L (ref 39–117)
BUN: 11 mg/dL (ref 6–23)
CO2: 30 mEq/L (ref 19–32)
Calcium: 9.7 mg/dL (ref 8.4–10.5)
Chloride: 102 mEq/L (ref 96–112)
Creatinine, Ser: 0.8 mg/dL (ref 0.40–1.20)
GFR: 79.9 mL/min (ref >60.00)
Glucose, Bld: 166 mg/dL — ABNORMAL HIGH (ref 70–99)
Potassium: 3.5 mEq/L (ref 3.5–5.1)
Sodium: 139 mEq/L (ref 135–145)
Total Bilirubin: 0.3 mg/dL (ref 0.2–1.2)
Total Protein: 7.3 g/dL (ref 6.0–8.3)

## 2021-07-31 LAB — CBC WITH DIFFERENTIAL/PLATELET
Basophils Absolute: 0 10*3/uL (ref 0.0–0.1)
Basophils Relative: 0.6 % (ref 0.0–3.0)
Eosinophils Absolute: 0.1 10*3/uL (ref 0.0–0.7)
Eosinophils Relative: 1.4 % (ref 0.0–5.0)
HCT: 36.4 % (ref 36.0–46.0)
Hemoglobin: 11.6 g/dL — ABNORMAL LOW (ref 12.0–15.0)
Lymphocytes Relative: 36.2 % (ref 12.0–46.0)
Lymphs Abs: 2.4 10*3/uL (ref 0.7–4.0)
MCHC: 31.8 g/dL (ref 30.0–36.0)
MCV: 83 fl (ref 78.0–100.0)
Monocytes Absolute: 0.4 10*3/uL (ref 0.1–1.0)
Monocytes Relative: 6.5 % (ref 3.0–12.0)
Neutro Abs: 3.7 10*3/uL (ref 1.4–7.7)
Neutrophils Relative %: 55.3 % (ref 43.0–77.0)
Platelets: 303 10*3/uL (ref 150.0–400.0)
RBC: 4.39 Mil/uL (ref 3.87–5.11)
RDW: 15.5 % (ref 11.5–15.5)
WBC: 6.6 10*3/uL (ref 4.0–10.5)

## 2021-07-31 LAB — LIPID PANEL
Cholesterol: 108 mg/dL (ref 0–200)
HDL: 62.4 mg/dL (ref >39.00)
LDL Cholesterol: 27 mg/dL (ref 0–99)
NonHDL: 45.78
Total CHOL/HDL Ratio: 2
Triglycerides: 94 mg/dL (ref 0.0–149.0)
VLDL: 18.8 mg/dL (ref 0.0–40.0)

## 2021-07-31 LAB — TSH: TSH: 0.64 u[IU]/mL (ref 0.35–5.50)

## 2021-07-31 MED ORDER — MECLIZINE HCL 25 MG PO TABS: 25.0000 mg | ORAL_TABLET | Freq: Three times a day (TID) | ORAL | 1 refills | Status: DC | PRN

## 2021-07-31 NOTE — Assessment & Plan Note (Signed)
Stable.  Continue atorvastatin 10 mg daily.  Lipid profile done today.

## 2021-07-31 NOTE — Assessment & Plan Note (Signed)
Cancer and cardiovascular risk associated with smoking discussed.  Smoking cessation advice given. 

## 2021-07-31 NOTE — Assessment & Plan Note (Signed)
Well-controlled hypertension.  No recent changes in medications. BP Readings from Last 3 Encounters:  07/31/21 130/70  07/04/21 126/82  04/18/21 (!) 144/80  Continue Hyzaar 100-12.5 mg and amlodipine 10 mg daily. Hemoglobin A1c last December at 7.3.  Advised to continue metformin and glimepiride.  Advised to decrease amount of daily carbohydrate intake. Follow-up in 3 months

## 2021-07-31 NOTE — Patient Instructions (Signed)

## 2021-07-31 NOTE — Progress Notes (Signed)
Stephanie Joseph 61 y.o.   Chief Complaint  Patient presents with   Dizziness    Nausea, pt feels overheated. X  4 days    HISTORY OF PRESENT ILLNESS: This is a 61 y.o. female complaining of dizziness with nausea and feeling overheated for the past 4 days. Dizziness is worse when she lies down and turns her head.  Had no trouble driving here.  Not dizzy at present time. Denies flulike symptoms.  Denies fever or chills.  Last menstrual period was 2 years ago.  Needs gynecology referral. Having intermittent hot flashes. Denies syncope.  Denies visual problems. No new medications.  No changes in diet or daily routine. No other complaints or medical concerns today.  Dizziness Associated symptoms include nausea. Pertinent negatives include no abdominal pain, chest pain, chills, congestion, coughing, fever, headaches, rash, sore throat or vomiting.    Prior to Admission medications   Medication Sig Start Date End Date Taking? Authorizing Provider  amLODipine (NORVASC) 10 MG tablet Take 1 tablet (10 mg total) by mouth daily. 05/03/21  Yes Rogerio Boutelle, Ines Bloomer, MD  atorvastatin (LIPITOR) 10 MG tablet Take 1 tablet (10 mg total) by mouth daily. 05/03/21  Yes Myalee Stengel, Ines Bloomer, MD  brimonidine-timolol (COMBIGAN) 0.2-0.5 % ophthalmic solution Place 1 drop into both eyes every 12 (twelve) hours.   Yes [provider]  busPIRone (BUSPAR) 10 MG tablet Take 1 tablet (10 mg total) by mouth 3 (three) times daily. 07/16/21  Yes Eulis Canner E, NP  fluticasone (FLONASE) 50 MCG/ACT nasal spray Place 2 sprays into both nostrils daily. 05/13/21  Yes Michela Pitcher, NP  glimepiride (AMARYL) 2 MG tablet TAKE ONE TABLET BY MOUTH EVERY DAY WITH BREAKFAST. 06/26/21  Yes Colman Birdwell, Ines Bloomer, MD  glucose blood (ACCU-CHEK AVIVA) test strip Use to check blood sugars 1-3 times daily. Dx Code: E11.9 01/20/18  Yes Marrian Salvage, FNP  hydrOXYzine (ATARAX) 25 MG tablet Take 1 tablet  (25 mg total) by mouth every 8 (eight) hours as needed for itching. 07/16/21  Yes Eulis Canner E, NP  Lancets (ACCU-CHEK SOFT TOUCH) lancets Use as instructed 05/21/17  Yes Shambley, Delphia Grates, NP  losartan-hydrochlorothiazide (HYZAAR) 100-12.5 MG tablet Take 1 tablet by mouth daily. 05/03/21  Yes Barbara Ahart, Ines Bloomer, MD  meclizine (ANTIVERT) 25 MG tablet Take 1 tablet (25 mg total) by mouth 3 (three) times daily as needed for dizziness. 07/31/21  Yes Horald Pollen, MD  metFORMIN (GLUCOPHAGE) 500 MG tablet Take 1 tablet (500 mg total) by mouth 2 (two) times daily with a meal. 07/04/21 10/02/21 Yes Daphney Hopke, Ines Bloomer, MD  methocarbamol (ROBAXIN) 500 MG tablet TAKE ONE TABLET BY MOUTH EVERY 8 HOURS AS NEEDED FOR MUSCLE SPASMS 01/27/20  Yes Marrian Salvage, [place]. Devices (CANE) MISC Use as needed for left knee pain. 01/29/18  Yes Shambley, Delphia Grates, NP  naproxen (NAPROSYN) 375 MG tablet Take 1 tablet (375 mg total) by mouth 2 (two) times daily as needed for moderate pain. 07/13/20  Yes Marrian Salvage, FNP  QUEtiapine 150 MG TABS Take 100 mg by mouth at bedtime. 07/16/21  Yes Eulis Canner E, NP  venlafaxine (EFFEXOR) 75 MG tablet Take 1 tablet (75 mg total) by mouth 3 (three) times daily with meals. 07/16/21  Yes Salley Slaughter, NP    No Known Allergies  Patient Active Problem List   Diagnosis Date Noted   Chronic midline low back pain 07/12/2018   Glaucoma 05/21/2017   Tooth  ache 04/28/2017   Class 1 obesity due to excess calories with serious comorbidity and body mass index (BMI) of 33.0 to 33.9 in adult 09/19/2016   Vitamin D deficiency 09/19/2016   Dyslipidemia associated with type 2 diabetes mellitus ([place]) 07/10/2015   Hormone replacement therapy (postmenopausal) 10/04/2014   Mitral regurgitation 11/05/2012   Valvular heart disease 10/21/2011   Menorrhagia, premenopausal 09/01/2011   Anxiety and depression 03/04/2010   GOUT 12/16/2006   ANEMIA,  IRON DEFICIENCY NOS 12/16/2006   Tobacco abuse 12/16/2006   Hypertension associated with diabetes ([place]) 12/16/2006    Past Medical History:  Diagnosis Date   Anemia, iron deficiency    Arthritis    Depression    Diabetes mellitus without complication (HCC)    Dysmenorrhea    Gout    Heart valve problem    Hypertension    Menorrhagia    Mitral regurgitation    Pneumonia     Past Surgical History:  Procedure Laterality Date   KNEE ARTHROSCOPY     Left    Social History   Socioeconomic History   Marital status: Married    Spouse name: Not on file   Number of children: 1   Years of education: 12   Highest education level: Not on file  Occupational History   Occupation: Disability Pending  Tobacco Use   Smoking status: Every Day    Packs/day: 0.50    Years: 7.00    Pack years: 3.50    Types: Cigarettes   Smokeless tobacco: Never  Vaping Use   Vaping Use: Never used  Substance and Sexual Activity   Alcohol use: No   Drug use: No    Types: "Crack" cocaine   Sexual activity: Yes    Birth control/protection: None  Other Topics Concern   Not on file  Social History Narrative   Fun: Go to church   Denies abuse and feels safe at home.    Social Determinants of Health   Financial Resource Strain: Not on file  Food Insecurity: Not on file  Transportation Needs: Not on file  Physical Activity: Not on file  Stress: Not on file  Social Connections: Not on file  Intimate Partner Violence: Not on file    Family History  Problem Relation Age of Onset   Hypertension Mother    Stroke Paternal Grandmother    Heart disease Sister    Heart disease Brother      Review of Systems  Constitutional: Negative.  Negative for chills and fever.  HENT: Negative.  Negative for congestion, ear pain and sore throat.   Respiratory: Negative.  Negative for cough and shortness of breath.   Cardiovascular: Negative.  Negative for chest pain and palpitations.  Gastrointestinal:   Positive for nausea. Negative for abdominal pain, diarrhea and vomiting.  Genitourinary: Negative.  Negative for dysuria and hematuria.  Musculoskeletal: Negative.   Skin: Negative.  Negative for rash.  Neurological:  Positive for dizziness. Negative for headaches.  All other systems reviewed and are negative.  Today's Vitals   07/31/21 1416  BP: 140/82  Pulse: 83  Temp: 98.6 F (37 C)  TempSrc: Oral  SpO2: 98%  Weight: 206 lb (93.4 kg)  Height: 5\' 7"  (1.702 m)   Body mass index is 32.26 kg/m.  Physical Exam Vitals reviewed.  Constitutional:      Appearance: Normal appearance.  HENT:     Head: Normocephalic.     Right Ear: Tympanic membrane, ear canal and external ear  normal.     Left Ear: Tympanic membrane, ear canal and external ear normal.     Mouth/Throat:     Mouth: Mucous membranes are moist.     Pharynx: Oropharynx is clear.  Eyes:     Extraocular Movements: Extraocular movements intact.     Conjunctiva/sclera: Conjunctivae normal.     Pupils: Pupils are equal, round, and reactive to light.  Neck:     Vascular: No carotid bruit.  Cardiovascular:     Rate and Rhythm: Normal rate and regular rhythm.     Pulses: Normal pulses.     Heart sounds: Normal heart sounds.  Pulmonary:     Effort: Pulmonary effort is normal.     Breath sounds: Normal breath sounds.  Abdominal:     Palpations: Abdomen is soft.     Tenderness: There is no abdominal tenderness.  Musculoskeletal:        General: Normal range of motion.     Cervical back: Normal range of motion and neck supple. No tenderness.     Right lower leg: No edema.     Left lower leg: No edema.  Lymphadenopathy:     Cervical: No cervical adenopathy.  Skin:    General: Skin is warm and dry.     Capillary Refill: Capillary refill takes less than 2 seconds.  Neurological:     General: No focal deficit present.     Mental Status: She is alert and oriented to person, place, and time.     Cranial Nerves: No  cranial nerve deficit.     Sensory: No sensory deficit.     Motor: No weakness.     Coordination: Coordination normal.     Gait: Gait normal.     Deep Tendon Reflexes: Reflexes normal.  Psychiatric:        Mood and Affect: Mood normal.        Behavior: Behavior normal.     ASSESSMENT & PLAN: Problem List Items Addressed This Visit       Cardiovascular and Mediastinum   Hypertension associated with diabetes (HCC)    Well-controlled hypertension.  No recent changes in medications. BP Readings from Last 3 Encounters:  07/31/21 130/70  07/04/21 126/82  04/18/21 (!) 144/80  Continue Hyzaar 100-12.5 mg and amlodipine 10 mg daily. Hemoglobin A1c last December at 7.3.  Advised to continue metformin and glimepiride.  Advised to decrease amount of daily carbohydrate intake. Follow-up in 3 months          Endocrine   Dyslipidemia associated with type 2 diabetes mellitus (HCC)    Stable.  Continue atorvastatin 10 mg daily.  Lipid profile done today.       Relevant Orders   Lipid panel     Other   Tobacco abuse    Cancer and cardiovascular risk associated with smoking discussed.  Smoking cessation advice given.      Hormone replacement therapy (postmenopausal)    Hot flashes affecting quality of life.  Needs GYN evaluation.  May need medical treatment.      Dizziness - Primary    Differential diagnosis discussed with patient.  No red flag signs or symptoms.  Doubt posterior circulation problems.  Peripheral vertigo most likely. Will try meclizine 25 mg as needed.  Blood work done today.      Relevant Medications   meclizine (ANTIVERT) 25 MG tablet   Other Relevant Orders   Comprehensive metabolic panel   TSH   CBC with Differential/Platelet   Other  Visit Diagnoses     Hot flashes       Relevant Orders   Ambulatory referral to Gynecology      Patient Instructions  Dizziness Dizziness is a common problem. It makes you feel unsteady or light-headed. You may feel  like you are about to pass out (faint). Dizziness can lead to getting hurt if you stumble or fall. Dizziness can be caused by many things, including: Medicines. Not having enough water in your body (dehydration). Illness. Follow these instructions at home: Eating and drinking  Drink enough fluid to keep your pee (urine) pale yellow. This helps to keep you from getting dehydrated. Try to drink more clear fluids, such as water. Do not drink alcohol. Limit how much caffeine you drink or eat, if your doctor tells you to do that. Limit how much salt (sodium) you drink or eat, if your doctor tells you to do that. Activity pibName    Agustina Caroli, MD [facility]

## 2021-07-31 NOTE — Assessment & Plan Note (Signed)
Hot flashes affecting quality of life.  Needs GYN evaluation.  May need medical treatment.

## 2021-07-31 NOTE — Assessment & Plan Note (Signed)
Differential diagnosis discussed with patient.  No red flag signs or symptoms.  Doubt posterior circulation problems.  Peripheral vertigo most likely. Will try meclizine 25 mg as needed.  Blood work done today.

## 2021-08-01 ENCOUNTER — Ambulatory Visit (INDEPENDENT_AMBULATORY_CARE_PROVIDER_SITE_OTHER): Admitting: Clinical

## 2021-08-01 DIAGNOSIS — F330 Major depressive disorder, recurrent, mild: Secondary | ICD-10-CM

## 2021-08-03 NOTE — Progress Notes (Signed)
° °  THERAPIST PROGRESS NOTE Virtual Visit via Video Note  I connected with Stephanie Joseph on 08/01/2021 at  9:00 AM EST by a video enabled telemedicine application and verified that I am speaking with the correct person using two identifiers.  Location: Patient: home Provider: office   I discussed the limitations of evaluation and management by telemedicine and the availability of in person appointments. The patient expressed understanding and agreed to proceed.   Follow Up Instructions: I discussed the assessment and treatment plan with the patient. The patient was provided an opportunity to ask questions and all were answered. The patient agreed with the plan and demonstrated an understanding of the instructions.   The patient was advised to call back or seek an in-person evaluation if the symptoms worsen or if the condition fails to improve as anticipated.   Session Time: 20 minutes  Participation Level: Active  Behavioral Response: CasualAlertEuthymic  Type of Therapy: Individual Therapy  Treatment Goals addressed: Coping  Interventions: CBT and Supportive  Summary:  Stephanie Joseph is a 61 y.o. female who presents for the scheduled session oriented times five, appropriately dressed, and friendly. Client denied hallucinations and delusions. Client reported on today she has been doing fairly well but has been feeling sick. Client reported she has been feeling nauseous, dizzy, and hasn't been able to eat much. Client reported she has lost 4 pounds. Client reported her dentures have also been contributing to her loss of ability to eat successfully. Client reported otherwise she had a good holidays with her family. Client reported she has been using her C-PAP machine and that has been helping her to sleep better. Client reported otherwise he is doing well.     Suicidal/Homicidal: Nowithout intent/plan  Therapist Response:  Therapist began the appointment  asking the client how she has been doing. Therapist used CBT to utilize active listening and positive emotional support. Therapist used CBT to engage and ask the client to identify depressive symptoms if any. Therapist used CBT to engage and acknowledge the clients positive emotions. Client was scheduled for next appointment.     Plan: Return again in 5 weeks.  Diagnosis: Mild episode of recurrent major depressive disorder   Neena Rhymes Maryan Sivak, LCSW 08/01/2021

## 2021-08-08 ENCOUNTER — Encounter: Payer: Self-pay | Admitting: Emergency Medicine

## 2021-08-15 NOTE — Progress Notes (Signed)
PATIENT: Stephanie Joseph DOB: 1960-08-20  REASON FOR VISIT: follow up HISTORY FROM: patient  Chief Complaint  Patient presents with   Follow-up    Pt alone, rm 1 overall states she is stable. No issues or concerns. Luna machine and DME [employer]     HISTORY OF PRESENT ILLNESS: 08/19/21 ALL:  Stephanie Joseph returns for follow up for OSA on CPAP. Her reports below for the last 34 days shows that she has been compliant for days worn (79.4%), and sub optimal usage for greater than 4 hours (61.8%). Her apnea is somewhat managed with an AHI of 5.7. No significant leak was noted. She denies barriers to wearing her machine.   02/11/2021 ALL:  Stephanie Joseph is a 61 y.o. female here today for follow up for OSA on CPAP. Sleep study 03/2020 showed moderate OSA with AHI of 11.2/hr, REM AHI 18.4 and O2 nadir 82%. She is doing well on CPAP therapy. She is adjusting to using it every night. She does fight with the hose at times. She feels that she is sleeping better. She wakes more refreshed and isn't as tired throughout the day. She denies concerns with machine or supplies.   Compliance report dated 01/06/2021-02/11/2021 shows she used CPAP 32/37 days for compliance of 86.5% and 27/37 days for greater than 4 hours (73%). Average use was 5hr and . Residual AHI was 4.8 on 5-11cmH20. No leak noted.    HISTORY: (copied from Dr Teofilo Pod previous note)  Dear Vernona Rieger,   I saw your patient, Stephanie Joseph, upon your kind request, in my Sleep clinic today for initial consultation of her sleep disorder, in particular, evaluation of her prior diagnosis of sleep apnea.  The patient is unaccompanied today.  As you know, Ms, Mckeegan is a 61 year old right-handed woman with an underlying medical history of hypertension, gout, diabetes, anemia, arthritis, depression, mitral regurgitation and obesity, who was previously diagnosed with obstructive sleep apnea. She has not been on CPAP therapy; it was  recommended, but she could not start treatment due to cost as I understand.  I have reviewed your office note from 01/27/2020.  Sleep study testing was several years ago.  Her Epworth sleepiness score is 11 out of 24. Prior sleep study results are not available for my review today.  She reports snoring and witnessed apneas, excessive daytime somnolence, nocturia is about 3 times per average night and lately, in the recent past, she has had occasional nocturnal headaches, by the time she gets out of bed in the morning the headaches are typically resolved.  She has been followed by [facility] for her depression and anxiety and is going to establish with a new provider soon.  She has been on Seroquel 300 mg strength, takes half a pill at night as needed.  She also takes Effexor.  She has no family history of sleep apnea that she is aware of.  She lives with her husband her and her 58 year old daughter, they have no pets at the house.  She does have a TV in the bedroom and has it on at night but tries to turn it off or puts it on a timer at night.  She is generally in bed around 8 PM and rise time is around 7:30 AM.  She does not work.  Weight has been fairly stable.  She drinks caffeine in the form of coffee, typically 2 cups in the morning and occasional soda, typically no tea.  She does not drink  any alcohol and is working on smoking cessation, currently smokes about 4 to 5 cigarettes/day.   REVIEW OF SYSTEMS: Out of a complete 14 system review of symptoms, the patient complains only of the following symptoms, fatigue and all other reviewed systems are negative.   HOME MEDICATIONS: Outpatient Medications Prior to Visit  Medication Sig Dispense Refill   amLODipine (NORVASC) 10 MG tablet Take 1 tablet (10 mg total) by mouth daily. 90 tablet 3   atorvastatin (LIPITOR) 10 MG tablet Take 1 tablet (10 mg total) by mouth daily. 90 tablet 3   brimonidine-timolol (COMBIGAN) 0.2-0.5 % ophthalmic solution  Place 1 drop into both eyes every 12 (twelve) hours.     busPIRone (BUSPAR) 10 MG tablet Take 1 tablet (10 mg total) by mouth 3 (three) times daily. 270 tablet 1   fluticasone (FLONASE) 50 MCG/ACT nasal spray Place 2 sprays into both nostrils daily. 16 g 6   glimepiride (AMARYL) 2 MG tablet TAKE ONE TABLET BY MOUTH EVERY DAY WITH BREAKFAST. 90 tablet 1   glucose blood (ACCU-CHEK AVIVA) test strip Use to check blood sugars 1-3 times daily. Dx Code: E11.9 300 each 1   hydrOXYzine (ATARAX) 25 MG tablet Take 1 tablet (25 mg total) by mouth every 8 (eight) hours as needed for itching. 360 tablet 1   Lancets (ACCU-CHEK SOFT TOUCH) lancets Use as instructed 100 each 12   losartan-hydrochlorothiazide (HYZAAR) 100-12.5 MG tablet Take 1 tablet by mouth daily. 90 tablet 3   meclizine (ANTIVERT) 25 MG tablet Take 1 tablet (25 mg total) by mouth 3 (three) times daily as needed for dizziness. 30 tablet 1   metFORMIN (GLUCOPHAGE) 500 MG tablet Take 1 tablet (500 mg total) by mouth 2 (two) times daily with a meal. 180 tablet 3   methocarbamol (ROBAXIN) 500 MG tablet TAKE ONE TABLET BY MOUTH EVERY 8 HOURS AS NEEDED FOR MUSCLE SPASMS 90 tablet 3   Misc. Devices (CANE) MISC Use as needed for left knee pain. 1 each 0   naproxen (NAPROSYN) 375 MG tablet Take 1 tablet (375 mg total) by mouth 2 (two) times daily as needed for moderate pain. 180 tablet 0   QUEtiapine 150 MG TABS Take 100 mg by mouth at bedtime. 90 tablet 1   venlafaxine (EFFEXOR) 75 MG tablet Take 1 tablet (75 mg total) by mouth 3 (three) times daily with meals. 270 tablet 1   No facility-administered medications prior to visit.    PAST MEDICAL HISTORY: Past Medical History:  Diagnosis Date   Anemia, iron deficiency    Arthritis    Depression    Diabetes mellitus without complication (HCC)    Dysmenorrhea    Gout    Heart valve problem    Hypertension    Menorrhagia    Mitral regurgitation    Pneumonia     PAST SURGICAL HISTORY: Past  Surgical History:  Procedure Laterality Date   KNEE ARTHROSCOPY     Left    FAMILY HISTORY: Family History  Problem Relation Age of Onset   Hypertension Mother    Stroke Paternal Grandmother    Heart disease Sister    Heart disease Brother     SOCIAL HISTORY: Social History   Socioeconomic History   Marital status: Married    Spouse name: Not on file   Number of children: 1   Years of education: 12   Highest education level: Not on file  Occupational History   Occupation: Disability Pending  Tobacco Use  Smoking status: Every Day    Packs/day: 0.50    Years: 7.00    Pack years: 3.50    Types: Cigarettes   Smokeless tobacco: Never  Vaping Use   Vaping Use: Never used  Substance and Sexual Activity   Alcohol use: No   Drug use: No    Types: "Crack" cocaine   Sexual activity: Yes    Birth control/protection: None  Other Topics Concern   Not on file  Social History Narrative   Fun: Go to church   Denies abuse and feels safe at home.    Social Determinants of Health   Financial Resource Strain: Not on file  Food Insecurity: Not on file  Transportation Needs: Not on file  Physical Activity: Not on file  Stress: Not on file  Social Connections: Not on file  Intimate Partner Violence: Not on file     PHYSICAL EXAM  Vitals:   08/19/21 1055  BP: 136/82  Pulse: 85  Weight: 210 lb (95.3 kg)  Height: 5\' 7"  (1.702 m)    Body mass index is 32.89 kg/m.  Generalized: Well developed, in no acute distress  Cardiology: normal rate and rhythm, no murmur noted Respiratory: clear to auscultation bilaterally  Neurological examination  Mentation: Alert oriented to time, place, history taking. Follows all commands speech and language fluent Cranial nerve II-XII: Pupils were equal round reactive to light. Extraocular movements were full, visual field were full  Motor: The motor testing reveals 5 over 5 strength of all 4 extremities. Good symmetric motor tone is  noted throughout.  Gait and station: Gait is normal.    DIAGNOSTIC DATA (LABS, IMAGING, TESTING) - I reviewed patient records, labs, notes, testing and imaging myself where available.  No flowsheet data found.   Lab Results  Component Value Date   WBC 6.6 07/31/2021   HGB 11.6 (L) 07/31/2021   HCT 36.4 07/31/2021   MCV 83.0 07/31/2021   PLT 303.0 07/31/2021      Component Value Date/Time   NA 139 07/31/2021 1450   K 3.5 07/31/2021 1450   CL 102 07/31/2021 1450   CO2 30 07/31/2021 1450   GLUCOSE 166 (H) 07/31/2021 1450   BUN 11 07/31/2021 1450   CREATININE 0.80 07/31/2021 1450   CREATININE 0.91 01/27/2020 1011   CALCIUM 9.7 07/31/2021 1450   PROT 7.3 07/31/2021 1450   ALBUMIN 4.1 07/31/2021 1450   AST 12 07/31/2021 1450   ALT 9 07/31/2021 1450   ALKPHOS 82 07/31/2021 1450   BILITOT 0.3 07/31/2021 1450   GFRNONAA >60 12/08/2019 1611   GFRAA >60 12/08/2019 1611   Lab Results  Component Value Date   CHOL 108 07/31/2021   HDL 62.40 07/31/2021   LDLCALC 27 07/31/2021   TRIG 94.0 07/31/2021   CHOLHDL 2 07/31/2021   Lab Results  Component Value Date   HGBA1C 7.3 (H) 07/04/2021   Lab Results  Component Value Date   VITAMINB12 501 07/13/2020   Lab Results  Component Value Date   TSH 0.64 07/31/2021     ASSESSMENT AND PLAN 61 y.o. year old female  has a past medical history of Anemia, iron deficiency, Arthritis, Depression, Diabetes mellitus without complication (HCC), Dysmenorrhea, Gout, Heart valve problem, Hypertension, Menorrhagia, Mitral regurgitation, and Pneumonia. here with     ICD-10-CM   1. OSA on CPAP  G47.33 For home use only DME continuous positive airway pressure (CPAP)   Z99.89        Stephanie Joseph is  doing well on CPAP therapy. Compliance report reveals optimal daily usage and sub optimal 4 hour compliance. She was encouraged to continue using CPAP nightly and for greater than 4 hours each night. We will update supply orders as  indicated. Risks of untreated sleep apnea review and education materials provided. Healthy lifestyle habits encouraged. She will follow up in 6 months, sooner if needed. She verbalizes understanding and agreement with this plan.    Orders Placed This Encounter  Procedures   For home use only DME continuous positive airway pressure (CPAP)    Supplies    Order Specific Question:   Length of Need    Answer:   Lifetime    Order Specific Question:   Patient has OSA or probable OSA    Answer:   Yes    Order Specific Question:   Is the patient currently using CPAP in the home    Answer:   Yes    Order Specific Question:   Settings    Answer:   Other see comments    Order Specific Question:   CPAP supplies needed    Answer:   Mask, headgear, cushions, filters, heated tubing and water chamber      No orders of the defined types were placed in this encounter.     Shawnie Dappermy Tanyika Barros, FNP-C 08/19/2021, 11:39 AM [facility] [address], [address] [place], [place] [address] [phone]

## 2021-08-15 NOTE — Patient Instructions (Addendum)
Please continue using your CPAP regularly. While your insurance requires that you use CPAP at least 4 hours each night on 70% of the nights, I recommend, that you not skip any nights and use it throughout the night if you can. Getting used to CPAP and staying with the treatment long term does take time and patience and discipline. Untreated obstructive sleep apnea when it is moderate to severe can have an adverse impact on cardiovascular health and raise her risk for heart disease, arrhythmias, hypertension, congestive heart failure, stroke and diabetes. Untreated obstructive sleep apnea causes sleep disruption, nonrestorative sleep, and sleep deprivation. This can have an impact on your day to day functioning and cause daytime sleepiness and impairment of cognitive function, memory loss, mood disturbance, and problems focussing. Using CPAP regularly can improve these symptoms.  Follow up with me in 6 months, sooner if needed.

## 2021-08-19 ENCOUNTER — Ambulatory Visit (INDEPENDENT_AMBULATORY_CARE_PROVIDER_SITE_OTHER): Admitting: Family Medicine

## 2021-08-19 ENCOUNTER — Encounter: Payer: Self-pay | Admitting: Family Medicine

## 2021-08-19 VITALS — BP 136/82 | HR 85 | Ht 67.0 in | Wt 210.0 lb

## 2021-08-19 DIAGNOSIS — Z9989 Dependence on other enabling machines and devices: Secondary | ICD-10-CM | POA: Diagnosis not present

## 2021-08-19 DIAGNOSIS — G4733 Obstructive sleep apnea (adult) (pediatric): Secondary | ICD-10-CM

## 2021-08-19 NOTE — Progress Notes (Signed)
CM sent to [facility] for new order ?

## 2021-08-26 ENCOUNTER — Ambulatory Visit
Admission: RE | Admit: 2021-08-26 | Discharge: 2021-08-26 | Disposition: A | Discharge status: Home / Self Care | Source: Ambulatory Visit | Attending: Emergency Medicine | Admitting: Emergency Medicine

## 2021-08-26 DIAGNOSIS — Z1231 Encounter for screening mammogram for malignant neoplasm of breast: Secondary | ICD-10-CM

## 2021-09-24 ENCOUNTER — Other Ambulatory Visit: Payer: Self-pay

## 2021-09-24 ENCOUNTER — Encounter: Payer: Self-pay | Admitting: Obstetrics and Gynecology

## 2021-09-24 ENCOUNTER — Ambulatory Visit (INDEPENDENT_AMBULATORY_CARE_PROVIDER_SITE_OTHER): Admitting: Obstetrics and Gynecology

## 2021-09-24 DIAGNOSIS — N951 Menopausal and female climacteric states: Secondary | ICD-10-CM | POA: Diagnosis not present

## 2021-09-24 MED ORDER — PREMPRO 0.3-1.5 MG PO TABS: 1.0000 | ORAL_TABLET | Freq: Every day | ORAL | 8 refills | Status: DC

## 2021-09-24 NOTE — Progress Notes (Signed)
Reports hot flashes ?Denies vaginal discharge, irritation or bleeding ?

## 2021-09-24 NOTE — Progress Notes (Signed)
?  CC: hot flashes ?Subjective:  ? ? Patient ID: Stephanie Joseph, female    DOB: May 23, 1961, 61 y.o.   MRN: [id number] ? ?HPI ?61 yo G3P1 seen for hot flashes.  Pt notes her last menses was 5 years ago.  She was on HRT until 2020, but has been asymptomatic until January 2023.  Hot flashes have returned and were uncomfortable.  Pt states she was previously on HRT to "stop her bleeding" and to treat her previous hot flashes.  Records were reviewed and it appears that the HRT was no longer used as of 2020.  TSH was normal 07/2021. ? ? ?Review of Systems ? ?   ?Objective:  ? Physical Exam ?Vitals:  ? 09/24/21 1006  ?BP: 127/80  ?Pulse: 69  ? ? ? ? ?   ?Assessment & Plan:  ? ?1. Hot flashes due to menopause ?Will restart low dose combined HRT with virtual follow up in 3 months to see if it has been effective. ? ?- estrogen, conjugated,-medroxyprogesterone (PREMPRO) 0.3-1.5 MG tablet; Take 1 tablet by mouth daily.  Dispense: 30 tablet; Refill: 8 ? ? ?I spent 20 minutes dedicated to the care of this patient including previsit review of records, face to face time with the patient discussing treatment options, symptoms and post visit testing.  ?Warden Fillers, MD ?Faculty Attending, [facility]  ?

## 2021-09-26 ENCOUNTER — Other Ambulatory Visit: Payer: Self-pay

## 2021-09-26 ENCOUNTER — Telehealth (INDEPENDENT_AMBULATORY_CARE_PROVIDER_SITE_OTHER): Admitting: Psychiatry

## 2021-09-26 DIAGNOSIS — N951 Menopausal and female climacteric states: Secondary | ICD-10-CM

## 2021-09-26 DIAGNOSIS — F330 Major depressive disorder, recurrent, mild: Secondary | ICD-10-CM | POA: Diagnosis not present

## 2021-09-26 DIAGNOSIS — F411 Generalized anxiety disorder: Secondary | ICD-10-CM | POA: Diagnosis not present

## 2021-09-26 MED ORDER — VENLAFAXINE HCL 75 MG PO TABS: 75.0000 mg | ORAL_TABLET | Freq: Three times a day (TID) | ORAL | 1 refills | Status: AC

## 2021-09-26 MED ORDER — BUSPIRONE HCL 10 MG PO TABS
10.0000 mg | ORAL_TABLET | Freq: Three times a day (TID) | ORAL | 1 refills | Status: DC
Start: 2021-09-26 — End: 2022-04-01

## 2021-09-26 MED ORDER — QUETIAPINE FUMARATE 150 MG PO TABS: 150.0000 mg | ORAL_TABLET | Freq: Every day | ORAL | 1 refills | Status: DC

## 2021-09-26 MED ORDER — PREMPRO 0.3-1.5 MG PO TABS: 1.0000 | ORAL_TABLET | Freq: Every day | ORAL | 8 refills | Status: DC

## 2021-09-26 MED ORDER — HYDROXYZINE HCL 25 MG PO TABS: 25.0000 mg | ORAL_TABLET | Freq: Three times a day (TID) | ORAL | 1 refills | Status: DC | PRN

## 2021-09-26 MED ORDER — QUETIAPINE FUMARATE 150 MG PO TABS: 100.0000 mg | ORAL_TABLET | Freq: Every day | ORAL | 1 refills | Status: DC

## 2021-09-26 NOTE — Progress Notes (Signed)
Pt called to have rx sent to another pharmacy ?

## 2021-09-26 NOTE — Progress Notes (Signed)
BH MD/PA/NP OP Progress Note ? ?09/26/2021 12:04 PM ?Stephanie Joseph  ?MRN:  [id number] ? ?Virtual Visit via Telephone Note ? ?I connected with Stephanie Joseph on 09/26/21 at 11:30 AM EDT by telephone and verified that I am speaking with the correct person using two identifiers. ? ?Location: ?Patient: home ?Provider: offsite ?  ?I discussed the limitations, risks, security and privacy concerns of performing an evaluation and management service by telephone and the availability of in person appointments. I also discussed with the patient that there may be a patient responsible charge related to this service. The patient expressed understanding and agreed to proceed. ?  ?I discussed the assessment and treatment plan with the patient. The patient was provided an opportunity to ask questions and all were answered. The patient agreed with the plan and demonstrated an understanding of the instructions. ?  ?The patient was advised to call back or seek an in-person evaluation if the symptoms worsen or if the condition fails to improve as anticipated. ? ?I provided 5 minutes of non-face-to-face time during this encounter. ? ? ?Franne Grip, NP  ? ?Chief Complaint: Medication compliance ? ?HPI: Stephanie Joseph is a 61 year old female presenting to [facility] behavioral health outpatient for follow-up psychiatric evaluation.  She has a psychiatric history of anxiety and depression.  Her symptoms are managed with Effexor 75 mg 3 times daily, Seroquel 150 mg nightly, BuSpar 10 mg 3 times daily and hydroxyzine 25 mg every 8 hours as needed.  Patient reports medication compliance and denies adverse effects.  She reports that her medications are effective with managing her symptoms and denies the need for dosage adjustment today.  No medication changes today. ?Patient is alert and oriented x4, calm, pleasant and willing to engage.  She reports good mood, appetite and sleep.  She denies suicidal homicidal  ideations, paranoia, delusional thoughts, auditory or visual hallucinations. ? ?Visit Diagnosis:  ?  ICD-10-CM   ?1. Generalized anxiety disorder  F41.1 busPIRone (BUSPAR) 10 MG tablet  ?  hydrOXYzine (ATARAX) 25 MG tablet  ?  QUEtiapine Fumarate 150 MG TABS  ?  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  QUEtiapine Fumarate 150 MG TABS  ?  venlafaxine (EFFEXOR) 75 MG tablet  ?  ? ? ?Past Psychiatric History: See above ? ?Past Medical History:  ?Past Medical History:  ?Diagnosis Date  ? Anemia, iron deficiency   ? Arthritis   ? Depression   ? Diabetes mellitus without complication ([place])   ? Dysmenorrhea   ? Gout   ? Heart valve problem   ? Hypertension   ? Menorrhagia   ? Mitral regurgitation   ? Pneumonia   ?  ?Past Surgical History:  ?Procedure Laterality Date  ? HAMMER TOE SURGERY    ? KNEE ARTHROSCOPY    ? Left  ? ? ?Family Psychiatric History: None known ? ?Family History:  ?Family History  ?Problem Relation Age of Onset  ? Hypertension Mother   ? Stroke Paternal Grandmother   ? Heart disease Sister   ? Heart disease Brother   ? ? ?Social History:  ?Social History  ? ?Socioeconomic History  ? Marital status: Married  ?  Spouse name: Not on file  ? Number of children: 1  ? Years of education: 10  ? Highest education level: Not on file  ?Occupational History  ? Occupation: Disability Pending  ?Tobacco Use  ? Smoking status: Every Day  ?  Packs/day: 0.50  ?  Years: 7.00  ?  Pack years: 3.50  ?  Types: Cigarettes  ? Smokeless tobacco: Never  ?Vaping Use  ? Vaping Use: Never used  ?Substance and Sexual Activity  ? Alcohol use: No  ? Drug use: Not Currently  ?  Types: "Crack" cocaine  ? Sexual activity: Yes  ?  Birth control/protection: None  ?Other Topics Concern  ? Not on file  ?Social History Narrative  ? Fun: Go to church  ? Denies abuse and feels safe at home.   ? ?Social Determinants of Health  ? ?Financial Resource Strain: Not on file  ?Food  Insecurity: Not on file  ?Transportation Needs: Not on file  ?Physical Activity: Not on file  ?Stress: Not on file  ?Social Connections: Not on file  ? ? ?Allergies: No Known Allergies ? ?Metabolic Disorder Labs: ?Lab Results  ?Component Value Date  ? HGBA1C 7.3 (H) 07/04/2021  ? MPG 197 01/27/2020  ? ?No results found for: PROLACTIN ?Lab Results  ?Component Value Date  ? CHOL 108 07/31/2021  ? TRIG 94.0 07/31/2021  ? HDL 62.40 07/31/2021  ? CHOLHDL 2 07/31/2021  ? VLDL 18.8 07/31/2021  ? [place] 27 07/31/2021  ? [place] 28 07/04/2021  ? ?Lab Results  ?Component Value Date  ? TSH 0.64 07/31/2021  ? TSH 0.59 05/10/2019  ? ? ?Therapeutic Level Labs: ?No results found for: LITHIUM ?No results found for: VALPROATE ?No components found for:  CBMZ ? ?Current Medications: ?Current Outpatient Medications  ?Medication Sig Dispense Refill  ? amLODipine (NORVASC) 10 MG tablet Take 1 tablet (10 mg total) by mouth daily. 90 tablet 3  ? atorvastatin (LIPITOR) 10 MG tablet Take 1 tablet (10 mg total) by mouth daily. 90 tablet 3  ? brimonidine-timolol (COMBIGAN) 0.2-0.5 % ophthalmic solution Place 1 drop into both eyes every 12 (twelve) hours.    ? busPIRone (BUSPAR) 10 MG tablet Take 1 tablet (10 mg total) by mouth 3 (three) times daily. 270 tablet 1  ? estrogen, conjugated,-medroxyprogesterone (PREMPRO) 0.3-1.5 MG tablet Take 1 tablet by mouth daily. 30 tablet 8  ? fluticasone (FLONASE) 50 MCG/ACT nasal spray Place 2 sprays into both nostrils daily. (Patient not taking: Reported on 09/24/2021) 16 g 6  ? glimepiride (AMARYL) 2 MG tablet TAKE ONE TABLET BY MOUTH EVERY DAY WITH BREAKFAST. 90 tablet 1  ? glucose blood (ACCU-CHEK AVIVA) test strip Use to check blood sugars 1-3 times daily. Dx Code: E11.9 300 each 1  ? hydrOXYzine (ATARAX) 25 MG tablet Take 1 tablet (25 mg total) by mouth every 8 (eight) hours as needed for itching. 360 tablet 1  ? Lancets (ACCU-CHEK SOFT TOUCH) lancets Use as instructed 100 each 12  ?  losartan-hydrochlorothiazide (HYZAAR) 100-12.5 MG tablet Take 1 tablet by mouth daily. 90 tablet 3  ? meclizine (ANTIVERT) 25 MG tablet Take 1 tablet (25 mg total) by mouth 3 (three) times daily as needed for dizziness. 30 tablet 1  ? metFORMIN (GLUCOPHAGE) 500 MG tablet Take 1 tablet (500 mg total) by mouth 2 (two) times daily with a meal. 180 tablet 3  ? methocarbamol (ROBAXIN) 500 MG tablet TAKE ONE TABLET BY MOUTH EVERY 8 HOURS AS NEEDED FOR MUSCLE SPASMS 90 tablet 3  ? Misc. Devices (CANE) MISC Use as needed for left knee pain. 1 each 0  ? naproxen (NAPROSYN) 375 MG tablet Take 1 tablet (375 mg total) by mouth 2 (two) times daily as needed for moderate pain. 180 tablet 0  ?  QUEtiapine Fumarate 150 MG TABS Take 100 mg by mouth at bedtime. 90 tablet 1  ? venlafaxine (EFFEXOR) 75 MG tablet Take 1 tablet (75 mg total) by mouth 3 (three) times daily with meals. 270 tablet 1  ? ?No current facility-administered medications for this visit.  ? ? ? ?Musculoskeletal: ?Strength & Muscle Tone: N/A virtual visit ?Gait & Station: N/A ?Patient leans: N/A ? ?Psychiatric Specialty Exam: ?Review of Systems  ?Psychiatric/Behavioral:  Negative for hallucinations, self-injury and suicidal ideas.   ?All other systems reviewed and are negative.  ?Last menstrual period 02/13/2014.There is no height or weight on file to calculate BMI.  ?General Appearance: N/A  ?Eye Contact: N/A  ?Speech: Clear and coherent  ?Volume: Normal  ?Mood: Good  ?Affect: N/A  ?Thought Process: Goal directed  ?Orientation: Full  ?Thought Content: Logical  ?Suicidal Thoughts:  No  ?Homicidal Thoughts:  No  ?Memory: Good  ?Judgement:  Good  ?Insight:  Good  ?Psychomotor Activity: N/A  ?Concentration: Good  ?Recall: Good  ?Fund of Knowledge: Good  ?Language: Good  ?Akathisia:  NA  ?Handed:  Right  ?AIMS (if indicated): not done  ?Assets:  Communication Skills ?Desire for Improvement  ?ADL's:  Intact  ?Cognition: WNL  ?Sleep:  Good  ? ?Screenings: ?GAD-7    ? ?Flowsheet Row Video Visit from 07/16/2021 in [facility] from 03/12/2021 in [facility] Clinical Support from 12/11/2020 in [facility]

## 2021-10-03 ENCOUNTER — Telehealth (HOSPITAL_COMMUNITY): Payer: Self-pay | Admitting: Psychiatry

## 2021-10-04 ENCOUNTER — Ambulatory Visit (HOSPITAL_COMMUNITY): Admitting: Clinical

## 2021-10-08 ENCOUNTER — Telehealth (HOSPITAL_COMMUNITY): Payer: Self-pay | Admitting: *Deleted

## 2021-10-08 NOTE — Telephone Encounter (Signed)
Pharmacy faxed a request to ask if the formulation of patients quetiapine of the the extended release and want to know if the should send out a one hafl tab of 300 mg regular release or can it be changed to 150 extended release per pharmacy she has a hx of taking. I will forward this request to Ce Ce Penn NP and also put the form that was faxed per the pharmacy on Ce Ce's desk as she is not in the office today.  ?

## 2021-10-10 ENCOUNTER — Other Ambulatory Visit (HOSPITAL_COMMUNITY): Payer: Self-pay | Admitting: Psychiatry

## 2021-10-10 ENCOUNTER — Telehealth (HOSPITAL_COMMUNITY): Payer: Self-pay | Admitting: *Deleted

## 2021-10-10 DIAGNOSIS — F411 Generalized anxiety disorder: Secondary | ICD-10-CM

## 2021-10-10 DIAGNOSIS — F330 Major depressive disorder, recurrent, mild: Secondary | ICD-10-CM

## 2021-10-10 MED ORDER — QUETIAPINE FUMARATE ER 150 MG PO TB24: 150.0000 mg | ORAL_TABLET | Freq: Every day | ORAL | 2 refills | Status: AC

## 2021-10-10 NOTE — Telephone Encounter (Signed)
Second fax and final request from [place] pharmacy re the dose of quetiapine, same note in chart on 10/08/21. Will forward this request to Mcneil Sober NP. ?

## 2021-10-29 NOTE — Progress Notes (Signed)
Patient ID: Stephanie Joseph, female   DOB: 12/17/1960, 61 y.o.   MRN: [id number]  ? ? ? ? ?61 y.o. followed for murmur since 2015. 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 No LVE and no symptoms of chest pain, dyspnea, syncope or palpitaitons  CRF;s Include DM, HLD and HTN.  ? ?Husband doing better not running off and using crack  ? ?She is not working has back issues Occassionally BP up when on prednisone for this  ? ?Has daughter at home who works at [employer] ? ?09/24/20 Rx with Augmentin for dental infection  ? ?DM not well controlled A1c 7.3 07/13/20  ? 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 ?Has had left hip injections for pain and trochanteric bursitis  ? ?No cardiac complaints Seeing [place] [facility] ? ?ROS: Denies fever, malais, weight loss, blurry vision, decreased visual acuity, cough, sputum, SOB, hemoptysis, pleuritic pain, palpitaitons, heartburn, abdominal pain, melena, lower extremity edema, claudication, or rash.  All other systems reviewed and negative ? ?General: ?BP 136/78   Pulse 73   Ht 5\' 7"  (1.702 m)   Wt 217 lb 9.6 oz (98.7 kg)   LMP 02/13/2014 (Within Days)   SpO2 98%   BMI 34.08 kg/m?  ?Affect appropriate ?Overweight black female  ?HEENT: normal ?Neck supple with no adenopathy ?JVP normal no bruits no thyromegaly ?Lungs clear with no wheezing and good diaphragmatic motion ?Heart:  S1/S2 AR/MR  murmur , no rub, gallop or click ?PMI normal ?Abdomen: benighn, BS positve, no tenderness, no AAA ?no bruit.  No HSM or HJR ?Distal pulses intact with no bruits ?No edema ?Neuro non-focal ?Skin warm and dry ?No muscular weakness ? ? ? ?Current Outpatient Medications  ?Medication Sig Dispense Refill  ? amLODipine (NORVASC) 10 MG tablet Take 1 tablet (10 mg total) by mouth daily. 90 tablet 3  ? atorvastatin  (LIPITOR) 10 MG tablet Take 1 tablet (10 mg total) by mouth daily. 90 tablet 3  ? brimonidine-timolol (COMBIGAN) 0.2-0.5 % ophthalmic solution Place 1 drop into both eyes every 12 (twelve) hours.    ? busPIRone (BUSPAR) 10 MG tablet Take 1 tablet (10 mg total) by mouth 3 (three) times daily. 270 tablet 1  ? estrogen, conjugated,-medroxyprogesterone (PREMPRO) 0.3-1.5 MG tablet Take 1 tablet by mouth daily. 30 tablet 8  ? fluticasone (FLONASE) 50 MCG/ACT nasal spray Place 2 sprays into both nostrils daily. 16 g 6  ? glimepiride (AMARYL) 2 MG tablet TAKE ONE TABLET BY MOUTH EVERY DAY WITH BREAKFAST. 90 tablet 1  ? glucose blood (ACCU-CHEK AVIVA) test strip Use to check blood sugars 1-3 times daily. Dx Code: E11.9 300 each 1  ? hydrOXYzine (ATARAX) 25 MG tablet Take 1 tablet (25 mg total) by mouth every 8 (eight) hours as needed for itching. 360 tablet 1  ? Lancets (ACCU-CHEK SOFT TOUCH) lancets Use as instructed 100 each 12  ? losartan-hydrochlorothiazide (HYZAAR) 100-12.5 MG tablet Take 1 tablet by mouth daily. 90 tablet 3  ? meclizine (ANTIVERT) 25 MG tablet Take 1 tablet (25 mg total) by mouth 3 (three) times daily as needed for dizziness. 30 tablet 1  ? methocarbamol (ROBAXIN) 500 MG tablet TAKE ONE TABLET BY MOUTH EVERY 8 HOURS AS NEEDED FOR MUSCLE SPASMS 90 tablet 3  ? Misc. Devices (CANE) MISC Use as needed for left knee  pain. 1 each 0  ? naproxen (NAPROSYN) 375 MG tablet Take 1 tablet (375 mg total) by mouth 2 (two) times daily as needed for moderate pain. 180 tablet 0  ? QUEtiapine Fumarate (SEROQUEL XR) 150 MG 24 hr tablet Take 1 tablet (150 mg total) by mouth at bedtime. 30 tablet 2  ? venlafaxine (EFFEXOR) 75 MG tablet Take 1 tablet (75 mg total) by mouth 3 (three) times daily with meals. 270 tablet 1  ? metFORMIN (GLUCOPHAGE) 500 MG tablet Take 1 tablet (500 mg total) by mouth 2 (two) times daily with a meal. 180 tablet 3  ? ?No current facility-administered medications for this visit.   ? ? ?Allergies ? ?Patient has no known allergies. ? ?Electrocardiogram:  09/01/19 SR rate 66 nonspecific ST changes 11/07/2021 SR rate 73 normal  ? ?Assessment and Plan ? 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  ?Bipolar/Anxiety/Depression:  Stable f/u psychiatry on multiple meds  ? ?[id number] low carb diet.  Target hemoglobin A1c is 6.5 or less.  Continue current medications. ? ?HTN: Well controlled.  Continue current medications and low sodium Dash type diet.   ? ?HLD:  Continue statin labs with primary  ? ?Ortho: left trochanteric bursitis post injection  ? ?F/u with me in a year  ?Echo for MR/AR  ? ? ?Jenkins Rouge ? ?

## 2021-11-07 ENCOUNTER — Encounter: Payer: Self-pay | Admitting: Cardiovascular Disease

## 2021-11-07 ENCOUNTER — Ambulatory Visit (INDEPENDENT_AMBULATORY_CARE_PROVIDER_SITE_OTHER): Admitting: Cardiovascular Disease

## 2021-11-07 VITALS — BP 136/78 | HR 73 | Ht 67.0 in | Wt 217.6 lb

## 2021-11-07 DIAGNOSIS — I351 Nonrheumatic aortic (valve) insufficiency: Secondary | ICD-10-CM

## 2021-11-07 DIAGNOSIS — I340 Nonrheumatic mitral (valve) insufficiency: Secondary | ICD-10-CM | POA: Diagnosis not present

## 2021-11-07 NOTE — Patient Instructions (Signed)
Medication Instructions:  ?Your physician recommends that you continue on your current medications as directed. Please refer to the Current Medication list given to you today. ? ?*If you need a refill on your cardiac medications before your next appointment, please call your pharmacy* ? ? ?Lab Work: ?None ?If you have labs (blood work) drawn today and your tests are completely normal, you will receive your results only by: ?MyChart Message (if you have MyChart) OR ?A paper copy in the mail ?If you have any lab test that is abnormal or we need to change your treatment, we will call you to review the results. ? ? ?Testing/Procedures: ?Your physician has requested that you have an echocardiogram. Echocardiography is a painless test that uses sound waves to create images of your heart. It provides your doctor with information about the size and shape of your heart and how well your heart?s chambers and valves are working. This procedure takes approximately one hour. There are no restrictions for this procedure. ? ? ? ?Follow-Up: ?At [facility], you and your health needs are our priority.  As part of our continuing mission to provide you with exceptional heart care, we have created designated Provider Care Teams.  These Care Teams include your primary Cardiologist (physician) and Advanced Practice Providers (APPs -  Physician Assistants and Nurse Practitioners) who all work together to provide you with the care you need, when you need it. ? ?We recommend signing up for the patient portal called "MyChart".  Sign up information is provided on this After Visit Summary.  MyChart is used to connect with patients for Virtual Visits (Telemedicine).  Patients are able to view lab/test results, encounter notes, upcoming appointments, etc.  Non-urgent messages can be sent to your provider as well.   ?To learn more about what you can do with MyChart, go to [email or web address].   ? ?Your next appointment:   ?1 year(s) ? ?The  format for your next appointment:   ?In Person ? ?Provider:   ?Charlton Haws, MD  ? ?  ?

## 2021-11-11 NOTE — Telephone Encounter (Signed)
error 

## 2021-11-25 ENCOUNTER — Ambulatory Visit (HOSPITAL_COMMUNITY): Attending: Cardiovascular Disease

## 2021-11-25 DIAGNOSIS — I340 Nonrheumatic mitral (valve) insufficiency: Secondary | ICD-10-CM | POA: Diagnosis present

## 2021-11-25 DIAGNOSIS — I351 Nonrheumatic aortic (valve) insufficiency: Secondary | ICD-10-CM | POA: Insufficient documentation

## 2021-11-25 LAB — ECHOCARDIOGRAM COMPLETE
Area-P 1/2: 3.87 cm2
P 1/2 time: 402 msec
S' Lateral: 3.6 cm

## 2021-12-04 ENCOUNTER — Ambulatory Visit (INDEPENDENT_AMBULATORY_CARE_PROVIDER_SITE_OTHER): Admitting: Podiatry

## 2021-12-04 ENCOUNTER — Encounter: Payer: Self-pay | Admitting: Podiatry

## 2021-12-04 ENCOUNTER — Ambulatory Visit (INDEPENDENT_AMBULATORY_CARE_PROVIDER_SITE_OTHER)

## 2021-12-04 DIAGNOSIS — G8929 Other chronic pain: Secondary | ICD-10-CM | POA: Diagnosis not present

## 2021-12-04 DIAGNOSIS — M19079 Primary osteoarthritis, unspecified ankle and foot: Secondary | ICD-10-CM | POA: Diagnosis not present

## 2021-12-04 DIAGNOSIS — E1169 Type 2 diabetes mellitus with other specified complication: Secondary | ICD-10-CM | POA: Diagnosis not present

## 2021-12-04 DIAGNOSIS — E785 Hyperlipidemia, unspecified: Secondary | ICD-10-CM

## 2021-12-04 DIAGNOSIS — M545 Low back pain, unspecified: Secondary | ICD-10-CM | POA: Diagnosis not present

## 2021-12-04 MED ORDER — NAPROXEN 375 MG PO TABS: 375.0000 mg | ORAL_TABLET | Freq: Two times a day (BID) | ORAL | 0 refills | Status: DC | PRN

## 2021-12-04 NOTE — Progress Notes (Signed)
  Subjective:  Patient ID: Stephanie Joseph, female    DOB: [birth year],   MRN: [id number]  Chief Complaint  Patient presents with   Foot Pain    right foot pain and swelling    61 y.o. female presents for concern of right foot pain that has been going on for about a month. Relates it is painful when walking and when sitting. She relates a history of hammertoe surgery and bunion surgery in the past. Relates she does take medication and not sure what is helping for the pain. Relates most of the pain is on the top of the foot.  . Denies any other pedal complaints. Denies n/v/f/c.   Past Medical History:  Diagnosis Date   Anemia, iron deficiency    Arthritis    Depression    Diabetes mellitus without complication ([place])    Dysmenorrhea    Gout    Heart valve problem    Hypertension    Menorrhagia    Mitral regurgitation    Pneumonia     Objective:  Physical Exam: Vascular: DP/PT pulses 2/4 bilateral. CFT <3 seconds. Normal hair growth on digits. No edema.  Skin. No lacerations or abrasions bilateral feet.  Musculoskeletal: MMT 5/5 bilateral lower extremities in DF, PF, Inversion and Eversion. Deceased ROM in DF of ankle joint. Tender to palpation over TMTJ and [place] joints on dorsal right foot. No pain otherwise to palpation.  Neurological: Sensation intact to light touch.   Assessment:   1. Arthritis of midfoot   2. Chronic midline low back pain, unspecified whether sciatica present   3. Dyslipidemia associated with type 2 diabetes mellitus ([place])      Plan:  Patient was evaluated and treated and all questions answered. X-rays reviewed and discussed with patient. No acute fractures or dislocations noted.  Some degenerative changes noted throughout midfoot. Retained hardware from previous bunion surgery. Possible previous injury/fracture to third metatarsal base with some angulation of the metatarsal noted medially.  Discussed midfoot arthritis with patient and treatment  options.  X-rays reviewed. No acute fractures or dislocations. Degenerative changes noted throughout midfoot.  Discussed NSAIDS, topicals, and possible injections.  Prescription for naproxen provided. Discussed stiff soled shoes and carbon fiber foot plate.  Discussed if pain does not improve can discuss surgical options.  Patient to follow-up as needed.    Lorenda Peck, DPM

## 2021-12-09 ENCOUNTER — Telehealth: Payer: Self-pay | Admitting: Podiatry

## 2021-12-09 ENCOUNTER — Other Ambulatory Visit: Payer: Self-pay | Admitting: Podiatry

## 2021-12-09 DIAGNOSIS — M545 Low back pain, unspecified: Secondary | ICD-10-CM

## 2021-12-09 MED ORDER — NAPROXEN 375 MG PO TABS: 375.0000 mg | ORAL_TABLET | Freq: Two times a day (BID) | ORAL | 0 refills | Status: DC | PRN

## 2021-12-09 NOTE — Telephone Encounter (Signed)
Patient wants this prescription naproxen (NAPROSYN) 375 MG tablet [[id number]]  sent to the [place]  in  [place]  , she cant afford to get it at [facility]

## 2021-12-09 NOTE — Telephone Encounter (Signed)
Sent to pharmacy there  Thank

## 2021-12-09 NOTE — Telephone Encounter (Signed)
Plase advise

## 2021-12-11 NOTE — Telephone Encounter (Signed)
Patient nofified

## 2021-12-12 ENCOUNTER — Telehealth (HOSPITAL_COMMUNITY): Admitting: Psychiatry

## 2021-12-30 ENCOUNTER — Encounter: Payer: Self-pay | Admitting: Emergency Medicine

## 2021-12-30 ENCOUNTER — Ambulatory Visit (INDEPENDENT_AMBULATORY_CARE_PROVIDER_SITE_OTHER): Admitting: Emergency Medicine

## 2021-12-30 VITALS — BP 136/74 | HR 88 | Temp 99.0°F | Wt 220.5 lb

## 2021-12-30 DIAGNOSIS — E785 Hyperlipidemia, unspecified: Secondary | ICD-10-CM | POA: Diagnosis not present

## 2021-12-30 DIAGNOSIS — E1169 Type 2 diabetes mellitus with other specified complication: Secondary | ICD-10-CM

## 2021-12-30 DIAGNOSIS — M545 Low back pain, unspecified: Secondary | ICD-10-CM

## 2021-12-30 DIAGNOSIS — F17200 Nicotine dependence, unspecified, uncomplicated: Secondary | ICD-10-CM | POA: Diagnosis not present

## 2021-12-30 DIAGNOSIS — E1159 Type 2 diabetes mellitus with other circulatory complications: Secondary | ICD-10-CM | POA: Diagnosis not present

## 2021-12-30 DIAGNOSIS — Z1211 Encounter for screening for malignant neoplasm of colon: Secondary | ICD-10-CM

## 2021-12-30 DIAGNOSIS — I152 Hypertension secondary to endocrine disorders: Secondary | ICD-10-CM | POA: Diagnosis not present

## 2021-12-30 DIAGNOSIS — G8929 Other chronic pain: Secondary | ICD-10-CM

## 2021-12-30 LAB — POCT GLYCOSYLATED HEMOGLOBIN (HGB A1C): Hemoglobin A1C: 7.5 % — AB (ref 4.0–5.6)

## 2021-12-30 LAB — COMPREHENSIVE METABOLIC PANEL
ALT: 9 U/L (ref 0–35)
AST: 13 U/L (ref 0–37)
Albumin: 3.9 g/dL (ref 3.5–5.2)
Alkaline Phosphatase: 84 U/L (ref 39–117)
BUN: 12 mg/dL (ref 6–23)
CO2: 29 mEq/L (ref 19–32)
Calcium: 9.1 mg/dL (ref 8.4–10.5)
Chloride: 104 mEq/L (ref 96–112)
Creatinine, Ser: 0.79 mg/dL (ref 0.40–1.20)
GFR: 80.87 mL/min (ref >60.00)
Glucose, Bld: 135 mg/dL — ABNORMAL HIGH (ref 70–99)
Potassium: 3.8 mEq/L (ref 3.5–5.1)
Sodium: 139 mEq/L (ref 135–145)
Total Bilirubin: 0.3 mg/dL (ref 0.2–1.2)
Total Protein: 6.9 g/dL (ref 6.0–8.3)

## 2021-12-30 LAB — MICROALBUMIN / CREATININE URINE RATIO
Creatinine,U: 216.1 mg/dL
Microalb Creat Ratio: 0.4 mg/g (ref 0.0–30.0)
Microalb, Ur: 0.8 mg/dL (ref 0.0–1.9)

## 2021-12-30 LAB — LIPID PANEL
Cholesterol: 107 mg/dL (ref 0–200)
HDL: 59.5 mg/dL (ref >39.00)
LDL Cholesterol: 29 mg/dL (ref 0–99)
NonHDL: 47.49
Total CHOL/HDL Ratio: 2
Triglycerides: 92 mg/dL (ref 0.0–149.0)
VLDL: 18.4 mg/dL (ref 0.0–40.0)

## 2021-12-30 MED ORDER — METHOCARBAMOL 500 MG PO TABS: ORAL_TABLET | ORAL | 3 refills | Status: DC

## 2021-12-30 MED ORDER — TIRZEPATIDE 5 MG/0.5ML ~~LOC~~ SOAJ: 5.0000 mg | SUBCUTANEOUS | 3 refills | Status: DC

## 2021-12-30 MED ORDER — GLIMEPIRIDE 2 MG PO TABS: ORAL_TABLET | ORAL | 1 refills | Status: DC

## 2021-12-30 NOTE — Assessment & Plan Note (Signed)
Diet and nutrition discussed.  Cardiovascular risks associated with dyslipidemia discussed. Continue atorvastatin 10 mg daily.

## 2021-12-30 NOTE — Assessment & Plan Note (Signed)
Cardiovascular and cancer risk associated with smoking discussed. Smoking cessation advice given. Not ready to quit.

## 2022-01-01 ENCOUNTER — Telehealth: Payer: Self-pay | Admitting: *Deleted

## 2022-01-01 NOTE — Telephone Encounter (Signed)
   Pre-operative Risk Assessment    Patient Name: Stephanie Joseph  DOB: [birth year] MRN: [id number]  WE WILL NEED TO HAVE A NEW CLEARANCE FAXED OVER STATING THE PROCEDURE TO BE DONE AT THIS TIME. WE CANNOT PROVIDE A BLANKET TYPE CLEARANCE. IF ANY TEETH BEING EXTRACTED, WE WILL NEED TO KNOW HOW MANY TEETH ARE BEING EXTRACTED, AS WELL AS IF THEY ARE SIMPLE EXTRACTIONS OR SURGICAL.   W C  Request for Surgical Clearance   Procedure:   NEED CLARIFICATION   Date of Surgery:  Clearance TBD                                 Surgeon:  NOT LISTED Surgeon's Group or Practice Name:  [facility] Phone number:  [phone] Fax number:  [phone]   Type of Clearance Requested:   - Medical ; NO MEDICATIONS ARE LISTED AS NEEDING TO BE HELD   Type of Anesthesia:  Local    Additional requests/questions:    Elpidio Anis   01/01/2022, 1:44 PM

## 2022-01-02 NOTE — Telephone Encounter (Signed)
   Patient Name: Stephanie Joseph  DOB: 11-16-1960 MRN: [id number]  Primary Cardiologist: None  Chart reviewed as part of pre-operative protocol coverage.   IF SIMPLE EXTRACTION/CLEANINGS: Simple dental extractions (i.e. 1-2 teeth) are considered low risk procedures per guidelines and generally do not require any specific cardiac clearance. It is also generally accepted that for simple extractions and dental cleanings, there is no need to interrupt blood thinner therapy.   SBE prophylaxis is not required for the patient from a cardiac standpoint.  I will route this recommendation to the requesting party via Epic fax function and remove from pre-op pool.  Please call with questions.  Sharlene Dory, PA-C 01/02/2022, 1:58 PM

## 2022-01-02 NOTE — Telephone Encounter (Signed)
Called [facility], spoke with Corrie Dandy, per Corrie Dandy, pt is having  Procedure:  Extraction 1 tooth Anesthesia:  lidocaine or septacaine

## 2022-02-12 ENCOUNTER — Telehealth: Payer: Self-pay | Admitting: Family Medicine

## 2022-02-12 NOTE — Telephone Encounter (Signed)
LVM and sent mychart msg informing pt of r/s needed for 8/17 appt- Amy out.

## 2022-02-20 ENCOUNTER — Ambulatory Visit: Admitting: Family Medicine

## 2022-04-01 ENCOUNTER — Ambulatory Visit (INDEPENDENT_AMBULATORY_CARE_PROVIDER_SITE_OTHER): Admitting: Emergency Medicine

## 2022-04-01 ENCOUNTER — Encounter: Payer: Self-pay | Admitting: Emergency Medicine

## 2022-04-01 ENCOUNTER — Ambulatory Visit: Admitting: Emergency Medicine

## 2022-04-01 VITALS — BP 136/80 | HR 88 | Temp 98.5°F | Ht 67.0 in | Wt 218.5 lb

## 2022-04-01 DIAGNOSIS — E785 Hyperlipidemia, unspecified: Secondary | ICD-10-CM

## 2022-04-01 DIAGNOSIS — F330 Major depressive disorder, recurrent, mild: Secondary | ICD-10-CM | POA: Diagnosis not present

## 2022-04-01 DIAGNOSIS — I152 Hypertension secondary to endocrine disorders: Secondary | ICD-10-CM | POA: Diagnosis not present

## 2022-04-01 DIAGNOSIS — E1169 Type 2 diabetes mellitus with other specified complication: Secondary | ICD-10-CM

## 2022-04-01 DIAGNOSIS — G8929 Other chronic pain: Secondary | ICD-10-CM | POA: Insufficient documentation

## 2022-04-01 DIAGNOSIS — R42 Dizziness and giddiness: Secondary | ICD-10-CM

## 2022-04-01 DIAGNOSIS — F411 Generalized anxiety disorder: Secondary | ICD-10-CM | POA: Diagnosis not present

## 2022-04-01 DIAGNOSIS — F32A Depression, unspecified: Secondary | ICD-10-CM

## 2022-04-01 DIAGNOSIS — F17200 Nicotine dependence, unspecified, uncomplicated: Secondary | ICD-10-CM

## 2022-04-01 DIAGNOSIS — F419 Anxiety disorder, unspecified: Secondary | ICD-10-CM

## 2022-04-01 DIAGNOSIS — E1159 Type 2 diabetes mellitus with other circulatory complications: Secondary | ICD-10-CM | POA: Diagnosis not present

## 2022-04-01 DIAGNOSIS — M25552 Pain in left hip: Secondary | ICD-10-CM

## 2022-04-01 LAB — POCT GLYCOSYLATED HEMOGLOBIN (HGB A1C): Hemoglobin A1C: 6.6 % — AB (ref 4.0–5.6)

## 2022-04-01 MED ORDER — MECLIZINE HCL 25 MG PO TABS: 25.0000 mg | ORAL_TABLET | Freq: Three times a day (TID) | ORAL | 1 refills | Status: DC | PRN

## 2022-04-01 MED ORDER — BUSPIRONE HCL 10 MG PO TABS: 10.0000 mg | ORAL_TABLET | Freq: Three times a day (TID) | ORAL | 1 refills | Status: DC

## 2022-04-01 MED ORDER — HYDROXYZINE HCL 25 MG PO TABS: 25.0000 mg | ORAL_TABLET | Freq: Three times a day (TID) | ORAL | 1 refills | Status: AC | PRN

## 2022-04-01 NOTE — Patient Instructions (Signed)

## 2022-04-01 NOTE — Assessment & Plan Note (Signed)
Stable.  Diet and nutrition discussed.  Continue atorvastatin 10 mg daily. 

## 2022-04-01 NOTE — Assessment & Plan Note (Signed)
Well-controlled hypertension. Continue Hyzaar 100-12.5 mg daily and amlodipine 10 mg daily. BP Readings from Last 3 Encounters:  04/01/22 136/80  12/30/21 136/74  11/07/21 136/78  Well-controlled diabetes with hemoglobin A1c of 6.6. Continue weekly Mounjaro 5 mg, glimepiride 2 mg daily, and metformin 500 mg twice a day. Cardiovascular risks associated with hypertension and diabetes discussed. Diet and nutrition discussed. Follow-up in 3 months.

## 2022-04-01 NOTE — Assessment & Plan Note (Signed)
Well-controlled.  Continue present medications.  No changes. Follow-up with psychiatrist/psychologist.

## 2022-04-01 NOTE — Progress Notes (Signed)
Stephanie Joseph 61 y.o.   Chief Complaint  Patient presents with   Follow-up    F/u DM,    left hip swollen , painful     HISTORY OF PRESENT ILLNESS: This is a 61 y.o. female here for 38-month follow-up of diabetes.  Recently started on Mounjaro. Also complaining of left hip pain for the past 8 months.  Was seen by orthopedist.  Recommended physical therapy but states she cannot afford it. No other complaints or medical concerns today. Request medication refills of hydroxyzine and BuSpar.  HPI   Prior to Admission medications   Medication Sig Start Date End Date Taking? Authorizing Provider  amLODipine (NORVASC) 10 MG tablet Take 1 tablet (10 mg total) by mouth daily. 05/03/21  Yes Chari Parmenter, Eilleen Kempf, MD  amoxicillin (AMOXIL) 500 MG tablet Take 500 mg by mouth 3 (three) times daily. 12/27/21  Yes [provider]  atorvastatin (LIPITOR) 10 MG tablet Take 1 tablet (10 mg total) by mouth daily. 05/03/21  Yes Jmya Uliano, Eilleen Kempf, MD  brimonidine-timolol (COMBIGAN) 0.2-0.5 % ophthalmic solution Place 1 drop into both eyes every 12 (twelve) hours.   Yes [provider]  busPIRone (BUSPAR) 10 MG tablet Take 1 tablet (10 mg total) by mouth 3 (three) times daily. 09/26/21  Yes Penn, Cicely, NP  estrogen, conjugated,-medroxyprogesterone (PREMPRO) 0.3-1.5 MG tablet Take 1 tablet by mouth daily. 09/26/21  Yes Warden Fillers, MD  fluticasone (FLONASE) 50 MCG/ACT nasal spray Place 2 sprays into both nostrils daily. 05/13/21  Yes Eden Emms, NP  glimepiride (AMARYL) 2 MG tablet TAKE ONE TABLET BY MOUTH EVERY DAY WITH BREAKFAST. 12/30/21  Yes Etai Copado, Eilleen Kempf, MD  glucose blood (ACCU-CHEK AVIVA) test strip Use to check blood sugars 1-3 times daily. Dx Code: E11.9 01/20/18  Yes Olive Bass, FNP  hydrOXYzine (ATARAX) 25 MG tablet Take 1 tablet (25 mg total) by mouth every 8 (eight) hours as needed for itching. 09/26/21  Yes Penn, Cranston Neighbor, NP  Lancets  (ACCU-CHEK SOFT TOUCH) lancets Use as instructed 05/21/17  Yes Dragnev, Alphonse Guild, NP  losartan-hydrochlorothiazide (HYZAAR) 100-12.5 MG tablet Take 1 tablet by mouth daily. 05/03/21  Yes Rushie Brazel, Eilleen Kempf, MD  meclizine (ANTIVERT) 25 MG tablet Take 1 tablet (25 mg total) by mouth 3 (three) times daily as needed for dizziness. 07/31/21  Yes Sharay Bellissimo, Eilleen Kempf, MD  methocarbamol (ROBAXIN) 500 MG tablet TAKE ONE TABLET BY MOUTH EVERY 8 HOURS AS NEEDED FOR MUSCLE SPASMS 12/30/21  Yes Analeese Andreatta, Eilleen Kempf, MD  Misc. Devices (CANE) MISC Use as needed for left knee pain. 01/29/18  Yes Dragnev, Alphonse Guild, NP  naproxen (NAPROSYN) 375 MG tablet Take 1 tablet (375 mg total) by mouth 2 (two) times daily as needed for moderate pain. 12/09/21  Yes Louann Sjogren, DPM  QUEtiapine Fumarate (SEROQUEL XR) 150 MG 24 hr tablet Take 1 tablet (150 mg total) by mouth at bedtime. 10/10/21  Yes Penn, Cicely, NP  tirzepatide [facility]) 5 MG/0.5ML Pen Inject 5 mg into the skin once a week. 12/30/21  Yes Georgina Quint, MD  venlafaxine (EFFEXOR) 75 MG tablet Take 1 tablet (75 mg total) by mouth 3 (three) times daily with meals. 09/26/21  Yes Penn, Cranston Neighbor, NP  metFORMIN (GLUCOPHAGE) 500 MG tablet Take 1 tablet (500 mg total) by mouth 2 (two) times daily with a meal. 07/04/21 10/02/21  Georgina Quint, MD    No Known Allergies  Patient Active Problem List   Diagnosis Date Noted  Hot flashes due to menopause 09/24/2021   Dizziness 07/31/2021   Chronic midline low back pain 07/12/2018   Glaucoma 05/21/2017   Class 1 obesity due to excess calories with serious comorbidity and body mass index (BMI) of 33.0 to 33.9 in adult 09/19/2016   Vitamin D deficiency 09/19/2016   Dyslipidemia associated with type 2 diabetes mellitus ([place]) 07/10/2015   Hormone replacement therapy (postmenopausal) 10/04/2014   Mitral regurgitation 11/05/2012   Valvular heart disease 10/21/2011   Menorrhagia, premenopausal  09/01/2011   Anxiety and depression 03/04/2010   GOUT 12/16/2006   ANEMIA, IRON DEFICIENCY NOS 12/16/2006   Current smoker 12/16/2006   Hypertension associated with diabetes ([place]) 12/16/2006    Past Medical History:  Diagnosis Date   Anemia, iron deficiency    Arthritis    Depression    Diabetes mellitus without complication (HCC)    Dysmenorrhea    Gout    Heart valve problem    Hypertension    Menorrhagia    Mitral regurgitation    Pneumonia     Past Surgical History:  Procedure Laterality Date   HAMMER TOE SURGERY     KNEE ARTHROSCOPY     Left    Social History   Socioeconomic History   Marital status: Married    Spouse name: Not on file   Number of children: 1   Years of education: 12   Highest education level: Not on file  Occupational History   Occupation: Disability Pending  Tobacco Use   Smoking status: Every Day    Packs/day: 0.50    Years: 7.00    Total pack years: 3.50    Types: Cigarettes   Smokeless tobacco: Never  Vaping Use   Vaping Use: Never used  Substance and Sexual Activity   Alcohol use: No   Drug use: Not Currently    Types: "Crack" cocaine   Sexual activity: Yes    Birth control/protection: None  Other Topics Concern   Not on file  Social History Narrative   Fun: Go to church   Denies abuse and feels safe at home.    Social Determinants of Health   Financial Resource Strain: Not on file  Food Insecurity: Not on file  Transportation Needs: Not on file  Physical Activity: Not on file  Stress: Not on file  Social Connections: Not on file  Intimate Partner Violence: Not on file    Family History  Problem Relation Age of Onset   Hypertension Mother    Stroke Paternal Grandmother    Heart disease Sister    Heart disease Brother      Review of Systems  Constitutional: Negative.  Negative for chills and fever.  HENT: Negative.  Negative for congestion and sore throat.   Respiratory: Negative.  Negative for cough and  shortness of breath.   Cardiovascular: Negative.  Negative for chest pain and palpitations.  Gastrointestinal:  Negative for abdominal pain, diarrhea, nausea and vomiting.  Genitourinary: Negative.  Negative for dysuria.  Musculoskeletal:  Positive for joint pain (Chronic left hip pain).  Skin: Negative.  Negative for rash.  Neurological: Negative.  Negative for dizziness and headaches.  All other systems reviewed and are negative.   Today's Vitals   04/01/22 1100  BP: 136/80  Pulse: 88  Temp: 98.5 F (36.9 C)  TempSrc: Oral  SpO2: 98%  Weight: 218 lb 8 oz (99.1 kg)  Height: 5\' 7"  (1.702 m)   Body mass index is 34.22 kg/m. Wt Readings from  Last 3 Encounters:  04/01/22 218 lb 8 oz (99.1 kg)  12/30/21 220 lb 8 oz (100 kg)  11/07/21 217 lb 9.6 oz (98.7 kg)    Physical Exam Vitals reviewed.  Constitutional:      Appearance: Normal appearance. She is obese.  HENT:     Head: Normocephalic.     Mouth/Throat:     Mouth: Mucous membranes are moist.     Pharynx: Oropharynx is clear.  Eyes:     Extraocular Movements: Extraocular movements intact.     Conjunctiva/sclera: Conjunctivae normal.     Pupils: Pupils are equal, round, and reactive to light.  Cardiovascular:     Rate and Rhythm: Normal rate and regular rhythm.     Pulses: Normal pulses.     Heart sounds: Normal heart sounds.  Pulmonary:     Effort: Pulmonary effort is normal.     Breath sounds: Normal breath sounds.  Abdominal:     Palpations: Abdomen is soft.     Tenderness: There is no abdominal tenderness.  Musculoskeletal:     Cervical back: No tenderness.     Comments: Left hip: Mild tenderness, full range of motion complaining of pain  Lymphadenopathy:     Cervical: No cervical adenopathy.  Skin:    General: Skin is warm and dry.  Neurological:     General: No focal deficit present.     Mental Status: She is alert and oriented to person, place, and time.  Psychiatric:        Mood and Affect: Mood  normal.        Behavior: Behavior normal.    Results for orders placed or performed in visit on 04/01/22 (from the past 24 hour(s))  POCT HgB A1C     Status: Abnormal   Collection Time: 04/01/22 11:36 AM  Result Value Ref Range   Hemoglobin A1C 6.6 (A) 4.0 - 5.6 %   HbA1c POC (<> result, manual entry)     HbA1c, POC (prediabetic range)     HbA1c, POC (controlled diabetic range)       ASSESSMENT & PLAN: A total of 44 minutes was spent with the patient and counseling/coordination of care regarding preparing for this visit, review of most recent office visit notes, review of multiple chronic medical conditions and their management, review of all medications, review of most recent blood work results including interpretation of today's hemoglobin A1c, education on nutrition, cardiovascular risks associated with hypertension and diabetes, management of chronic left hip pain and need for orthopedic follow-up, pain management, prognosis, documentation, and need for follow-up.  Problem List Items Addressed This Visit       Cardiovascular and Mediastinum   Hypertension associated with diabetes ([place]) - Primary    Well-controlled hypertension. Continue Hyzaar 100-12.5 mg daily and amlodipine 10 mg daily. BP Readings from Last 3 Encounters:  04/01/22 136/80  12/30/21 136/74  11/07/21 136/78  Well-controlled diabetes with hemoglobin A1c of 6.6. Continue weekly Mounjaro 5 mg, glimepiride 2 mg daily, and metformin 500 mg twice a day. Cardiovascular risks associated with hypertension and diabetes discussed. Diet and nutrition discussed. Follow-up in 3 months.       Relevant Orders   POCT HgB A1C (Completed)     Endocrine   Dyslipidemia associated with type 2 diabetes mellitus ([place])    Stable.  Diet and nutrition discussed.  Continue atorvastatin 10 mg daily.         Other   Current smoker    States that she  is smoking less. Cardiac scoring cancer risks associated with smoking  discussed. Smoker cessation advice given.      Anxiety and depression    Well-controlled.  Continue present medications.  No changes. Follow-up with psychiatrist/psychologist.      Relevant Medications   busPIRone (BUSPAR) 10 MG tablet   hydrOXYzine (ATARAX) 25 MG tablet   Dizziness   Relevant Medications   meclizine (ANTIVERT) 25 MG tablet   Chronic left hip pain    Seen and evaluated by orthopedist in the past. Diagnosis chronic left hip bursitis.  Physical therapy recommended. May take Tylenol for pain.  Avoid NSAIDs.      Other Visit Diagnoses     Generalized anxiety disorder       Relevant Medications   busPIRone (BUSPAR) 10 MG tablet   hydrOXYzine (ATARAX) 25 MG tablet   Mild episode of recurrent major depressive disorder (HCC)       Relevant Medications   busPIRone (BUSPAR) 10 MG tablet   hydrOXYzine (ATARAX) 25 MG tablet      Patient Instructions  Diabetes Mellitus and Nutrition, Adult When you have diabetes, or diabetes mellitus, it is very important to have healthy eating habits because your blood sugar (glucose) levels are greatly affected by what you eat and drink. Eating healthy foods in the right amounts, at about the same times every day, can help you: Manage your blood glucose. Lower your risk of heart disease. Improve your blood pressure. Reach or maintain a healthy weight. What can affect my meal plan? Every person with diabetes is different, and each person has different needs for a meal plan. Your health care provider may recommend that you work with a dietitian to make a meal plan that is best for you. Your meal plan may vary depending on factors such as: The calories you need. The medicines you take. Your weight. Your blood glucose, blood pressure, and cholesterol levels. Your activity level. Other health conditions you have, such as heart or kidney disease. How do carbohydrates affect me? Carbohydrates, also called carbs, affect your blood  glucose level more than any other type of food. Eating carbs raises the amount of glucose in your blood. It is important to know how many carbs you can safely have in each meal. This is different for every person. Your dietitian can help you calculate how many carbs you should have at each meal and for each snack. How does alcohol affect me? Alcohol can cause a decrease in blood glucose (hypoglycemia), especially if you use insulin or take certain diabetes medicines by mouth. Hypoglycemia can be a life-threatening condition. Symptoms of hypoglycemia, such as sleepiness, dizziness, and confusion, are similar to symptoms of having too much alcohol. Do not drink alcohol if: Your health care provider tells you not to drink. You are pregnant, may be pregnant, or are planning to become pregnant. If you drink alcohol: Limit how much you have to: 0-1 drink a day for women. 0-2 drinks a day for men. Know how much alcohol is in your drink. In the [place] one drink equals one 12 oz bottle of beer (355 mL), one 5 oz glass of wine (148 mL), or one 1 oz glass of hard liquor (44 mL). Keep yourself hydrated with water, diet soda, or unsweetened iced tea. Keep in mind that regular soda, juice, and other mixers may contain a lot of sugar and must be counted as carbs. What are tips for following this plan?  Reading food labels Start by checking  the serving size on the Nutrition Facts label of packaged foods and drinks. The number of calories and the amount of carbs, fats, and other nutrients listed on the label are based on one serving of the item. Many items contain more than one serving per package. Check the total grams (g) of carbs in one serving. Check the number of grams of saturated fats and trans fats in one serving. Choose foods that have a low amount or none of these fats. Check the number of milligrams (mg) of salt (sodium) in one serving. Most people should limit total sodium intake to less than 2,300 mg  per day. Always check the nutrition information of foods labeled as "low-fat" or "nonfat." These foods may be higher in added sugar or refined carbs and should be avoided. Talk to your dietitian to identify your daily goals for nutrients listed on the label. Shopping Avoid buying canned, pre-made, or processed foods. These foods tend to be high in fat, sodium, and added sugar. Shop around the outside edge of the grocery store. This is where you will most often find fresh fruits and vegetables, bulk grains, fresh meats, and fresh dairy products. Cooking Use low-heat cooking methods, such as baking, instead of high-heat cooking methods, such as deep frying. Cook using healthy oils, such as olive, canola, or sunflower oil. Avoid cooking with butter, cream, or high-fat meats. Meal planning Eat meals and snacks regularly, preferably at the same times every day. Avoid going long periods of time without eating. Eat foods that are high in fiber, such as fresh fruits, vegetables, beans, and whole grains. Eat 4-6 oz (112-168 g) of lean protein each day, such as lean meat, chicken, fish, eggs, or tofu. One ounce (oz) (28 g) of lean protein is equal to: 1 oz (28 g) of meat, chicken, or fish. 1 egg.  cup (62 g) of tofu. Eat some foods each day that contain healthy fats, such as avocado, nuts, seeds, and fish. What foods should I eat? Fruits Berries. Apples. Oranges. Peaches. Apricots. Plums. Grapes. Mangoes. Papayas. Pomegranates. Kiwi. Cherries. Vegetables Leafy greens, including lettuce, spinach, kale, chard, collard greens, mustard greens, and cabbage. Beets. Cauliflower. Broccoli. Carrots. Green beans. Tomatoes. Peppers. Onions. Cucumbers. Brussels sprouts. Grains Whole grains, such as whole-wheat or whole-grain bread, crackers, tortillas, cereal, and pasta. Unsweetened oatmeal. Quinoa. Brown or wild rice. Meats and other proteins Seafood. Poultry without skin. Lean cuts of poultry and beef. Tofu.  Nuts. Seeds. Dairy Low-fat or fat-free dairy products such as milk, yogurt, and cheese. The items listed above may not be a complete list of foods and beverages you can eat and drink. Contact a dietitian for more information. What foods should I avoid? Fruits Fruits canned with syrup. Vegetables Canned vegetables. Frozen vegetables with butter or cream sauce. Grains Refined white flour and flour products such as bread, pasta, snack foods, and cereals. Avoid all processed foods. Meats and other proteins Fatty cuts of meat. Poultry with skin. Breaded or fried meats. Processed meat. Avoid saturated fats. Dairy Full-fat yogurt, cheese, or milk. Beverages Sweetened drinks, such as soda or iced tea. The items listed above may not be a complete list of foods and beverages you should avoid. Contact a dietitian for more information. Questions to ask a health care provider Do I need to meet with a certified diabetes care and education specialist? Do I need to meet with a dietitian? What number can I call if I have questions? When are the best times to check my  blood glucose? Where to find more information: American Diabetes Association: diabetes.org Academy of Nutrition and Dietetics: eatright.[employer] of Diabetes and Digestive and Kidney Diseases: [employer] Association of Diabetes Care & Education Specialists: diabeteseducator.org Summary It is important to have healthy eating habits because your blood sugar (glucose) levels are greatly affected by what you eat and drink. It is important to use alcohol carefully. A healthy meal plan will help you manage your blood glucose and lower your risk of heart disease. Your health care provider may recommend that you work with a dietitian to make a meal plan that is best for you. This information is not intended to replace advice given to you by your health care provider. Make sure you discuss any questions you have with your health  care provider. Document Revised: 01/25/2020 Document Reviewed: 01/25/2020 Elsevier Patient Education  [place], MD [facility]

## 2022-04-01 NOTE — Assessment & Plan Note (Signed)
Seen and evaluated by orthopedist in the past. Diagnosis chronic left hip bursitis.  Physical therapy recommended. May take Tylenol for pain.  Avoid NSAIDs.

## 2022-04-01 NOTE — Assessment & Plan Note (Signed)
States that she is smoking less. Cardiac scoring cancer risks associated with smoking discussed. Smoker cessation advice given.

## 2022-05-20 NOTE — Progress Notes (Unsigned)
PATIENT: Stephanie Joseph DOB: [birth year]  REASON FOR VISIT: follow up HISTORY FROM: patient  No chief complaint on file.    HISTORY OF PRESENT ILLNESS:  05/20/22 ALL:  Stephanie Joseph returns for follow up for OSA on CPAP. She was last seen 08/2021. Daily compliance was acceptable but she had low 4 hour compliance.    08/19/2021 ALL: Stephanie Joseph returns for follow up for OSA on CPAP. Her reports below for the last 34 days shows that she has been compliant for days worn (79.4%), and sub optimal usage for greater than 4 hours (61.8%). Her apnea is somewhat managed with an AHI of 5.7. No significant leak was noted. She denies barriers to wearing her machine.   02/11/2021 ALL:  Stephanie Joseph is a 61 y.o. female here today for follow up for OSA on CPAP. Sleep study 03/2020 showed moderate OSA with AHI of 11.2/hr, REM AHI 18.4 and O2 nadir 82%. She is doing well on CPAP therapy. She is adjusting to using it every night. She does fight with the hose at times. She feels that she is sleeping better. She wakes more refreshed and isn't as tired throughout the day. She denies concerns with machine or supplies.   Compliance report dated 01/06/2021-02/11/2021 shows she used CPAP 32/37 days for compliance of 86.5% and 27/37 days for greater than 4 hours (73%). Average use was 5hr and 45min. Residual AHI was 4.8 on 5-11cmH20. No leak noted.    HISTORY: (copied from Dr Teofilo PodAthar's previous note)  Dear Vernona RiegerLaura,   I saw your patient, Stephanie Joseph, upon your kind request, in my Sleep clinic today for initial consultation of her sleep disorder, in particular, evaluation of her prior diagnosis of sleep apnea.  The patient is unaccompanied today.  As you know, Stephanie Joseph, Stephanie Joseph is a 61 year old right-handed woman with an underlying medical history of hypertension, gout, diabetes, anemia, arthritis, depression, mitral regurgitation and obesity, who was previously diagnosed with obstructive sleep apnea. She has not  been on CPAP therapy; it was recommended, but she could not start treatment due to cost as I understand.  I have reviewed your office note from 01/27/2020.  Sleep study testing was several years ago.  Her Epworth sleepiness score is 11 out of 24. Prior sleep study results are not available for my review today.  She reports snoring and witnessed apneas, excessive daytime somnolence, nocturia is about 3 times per average night and lately, in the recent past, she has had occasional nocturnal headaches, by the time she gets out of bed in the morning the headaches are typically resolved.  She has [facility] her depression and anxiety and is going to establish with a new provider soon.  She has been on Seroquel 300 mg strength, takes half a pill at night as needed.  She also takes Effexor.  She has no family history of sleep apnea that she is aware of.  She lives with her husband her and her 709 year old daughter, they have no pets at the house.  She does have a TV in the bedroom and has it on at night but tries to turn it off or puts it on a timer at night.  She is generally in bed around 8 PM and rise time is around 7:30 AM.  She does not work.  Weight has been fairly stable.  She drinks caffeine in the form of coffee, typically 2 cups in the morning and occasional soda, typically no tea.  She does not drink any alcohol and is working on smoking cessation, currently smokes about 4 to 5 cigarettes/day.   REVIEW OF SYSTEMS: Out of a complete 14 system review of symptoms, the patient complains only of the following symptoms, fatigue and all other reviewed systems are negative.   HOME MEDICATIONS: Outpatient Medications Prior to Visit  Medication Sig Dispense Refill   amLODipine (NORVASC) 10 MG tablet Take 1 tablet (10 mg total) by mouth daily. 90 tablet 3   amoxicillin (AMOXIL) 500 MG tablet Take 500 mg by mouth 3 (three) times daily.     atorvastatin (LIPITOR) 10 MG tablet Take 1  tablet (10 mg total) by mouth daily. 90 tablet 3   brimonidine-timolol (COMBIGAN) 0.2-0.5 % ophthalmic solution Place 1 drop into both eyes every 12 (twelve) hours.     busPIRone (BUSPAR) 10 MG tablet Take 1 tablet (10 mg total) by mouth 3 (three) times daily. 270 tablet 1   estrogen, conjugated,-medroxyprogesterone (PREMPRO) 0.3-1.5 MG tablet Take 1 tablet by mouth daily. 30 tablet 8   fluticasone (FLONASE) 50 MCG/ACT nasal spray Place 2 sprays into both nostrils daily. 16 g 6   glimepiride (AMARYL) 2 MG tablet TAKE ONE TABLET BY MOUTH EVERY DAY WITH BREAKFAST. 90 tablet 1   glucose blood (ACCU-CHEK AVIVA) test strip Use to check blood sugars 1-3 times daily. Dx Code: E11.9 300 each 1   hydrOXYzine (ATARAX) 25 MG tablet Take 1 tablet (25 mg total) by mouth every 8 (eight) hours as needed for itching. 360 tablet 1   Lancets (ACCU-CHEK SOFT TOUCH) lancets Use as instructed 100 each 12   losartan-hydrochlorothiazide (HYZAAR) 100-12.5 MG tablet Take 1 tablet by mouth daily. 90 tablet 3   meclizine (ANTIVERT) 25 MG tablet Take 1 tablet (25 mg total) by mouth 3 (three) times daily as needed for dizziness. 30 tablet 1   metFORMIN (GLUCOPHAGE) 500 MG tablet Take 1 tablet (500 mg total) by mouth 2 (two) times daily with a meal. 180 tablet 3   methocarbamol (ROBAXIN) 500 MG tablet TAKE ONE TABLET BY MOUTH EVERY 8 HOURS AS NEEDED FOR MUSCLE SPASMS 90 tablet 3   Misc. Devices (CANE) MISC Use as needed for left knee pain. 1 each 0   naproxen (NAPROSYN) 375 MG tablet Take 1 tablet (375 mg total) by mouth 2 (two) times daily as needed for moderate pain. 180 tablet 0   QUEtiapine Fumarate (SEROQUEL XR) 150 MG 24 hr tablet Take 1 tablet (150 mg total) by mouth at bedtime. 30 tablet 2   tirzepatide (MOUNJARO) 5 MG/0.5ML Pen Inject 5 mg into the skin once a week. 6 mL 3   venlafaxine (EFFEXOR) 75 MG tablet Take 1 tablet (75 mg total) by mouth 3 (three) times daily with meals. 270 tablet 1   No facility-administered  medications prior to visit.    PAST MEDICAL HISTORY: Past Medical History:  Diagnosis Date   Anemia, iron deficiency    Arthritis    Depression    Diabetes mellitus without complication (HCC)    Dysmenorrhea    Gout    Heart valve problem    Hypertension    Menorrhagia    Mitral regurgitation    Pneumonia     PAST SURGICAL HISTORY: Past Surgical History:  Procedure Laterality Date   HAMMER TOE SURGERY     KNEE ARTHROSCOPY     Left    FAMILY HISTORY: Family History  Problem Relation Age of Onset   Hypertension Mother  Stroke Paternal Grandmother    Heart disease Sister    Heart disease Brother     SOCIAL HISTORY: Social History   Socioeconomic History   Marital status: Married    Spouse name: Not on file   Number of children: 1   Years of education: 12   Highest education level: Not on file  Occupational History   Occupation: Disability Pending  Tobacco Use   Smoking status: Every Day    Packs/day: 0.50    Years: 7.00    Total pack years: 3.50    Types: Cigarettes   Smokeless tobacco: Never  Vaping Use   Vaping Use: Never used  Substance and Sexual Activity   Alcohol use: No   Drug use: Not Currently    Types: "Crack" cocaine   Sexual activity: Yes    Birth control/protection: None  Other Topics Concern   Not on file  Social History Narrative   Fun: Go to church   Denies abuse and feels safe at home.    Social Determinants of Health   Financial Resource Strain: Not on file  Food Insecurity: Not on file  Transportation Needs: Not on file  Physical Activity: Not on file  Stress: Not on file  Social Connections: Not on file  Intimate Partner Violence: Not on file     PHYSICAL EXAM  There were no vitals filed for this visit.   There is no height or weight on file to calculate BMI.  Generalized: Well developed, in no acute distress  Cardiology: normal rate and rhythm, no murmur noted Respiratory: clear to auscultation bilaterally   Neurological examination  Mentation: Alert oriented to time, place, history taking. Follows all commands speech and language fluent Cranial nerve II-XII: Pupils were equal round reactive to light. Extraocular movements were full, visual field were full  Motor: The motor testing reveals 5 over 5 strength of all 4 extremities. Good symmetric motor tone is noted throughout.  Gait and station: Gait is normal.    DIAGNOSTIC DATA (LABS, IMAGING, TESTING) - I reviewed patient records, labs, notes, testing and imaging myself where available.      No data to display           Lab Results  Component Value Date   WBC 6.6 07/31/2021   HGB 11.6 (L) 07/31/2021   HCT 36.4 07/31/2021   MCV 83.0 07/31/2021   PLT 303.0 07/31/2021      Component Value Date/Time   NA 139 12/30/2021 1457   K 3.8 12/30/2021 1457   CL 104 12/30/2021 1457   CO2 29 12/30/2021 1457   GLUCOSE 135 (H) 12/30/2021 1457   BUN 12 12/30/2021 1457   CREATININE 0.79 12/30/2021 1457   CREATININE 0.91 01/27/2020 1011   CALCIUM 9.1 12/30/2021 1457   PROT 6.9 12/30/2021 1457   ALBUMIN 3.9 12/30/2021 1457   AST 13 12/30/2021 1457   ALT 9 12/30/2021 1457   ALKPHOS 84 12/30/2021 1457   BILITOT 0.3 12/30/2021 1457   GFRNONAA >60 12/08/2019 1611   GFRAA >60 12/08/2019 1611   Lab Results  Component Value Date   CHOL 107 12/30/2021   HDL 59.50 12/30/2021   LDLCALC 29 12/30/2021   TRIG 92.0 12/30/2021   CHOLHDL 2 12/30/2021   Lab Results  Component Value Date   HGBA1C 6.6 (A) 04/01/2022   Lab Results  Component Value Date   VITAMINB12 501 07/13/2020   Lab Results  Component Value Date   TSH 0.64 07/31/2021  ASSESSMENT AND PLAN 61 y.o. year old female  has a past medical history of Anemia, iron deficiency, Arthritis, Depression, Diabetes mellitus without complication (HCC), Dysmenorrhea, Gout, Heart valve problem, Hypertension, Menorrhagia, Mitral regurgitation, and Pneumonia. here with   No diagnosis  found.    Rami Waddle is doing well on CPAP therapy. Compliance report reveals optimal daily usage and sub optimal 4 hour compliance. She was encouraged to continue using CPAP nightly and for greater than 4 hours each night. We will update supply orders as indicated. Risks of untreated sleep apnea review and education materials provided. Healthy lifestyle habits encouraged. She will follow up in 6 months, sooner if needed. She verbalizes understanding and agreement with this plan.    No orders of the defined types were placed in this encounter.     No orders of the defined types were placed in this encounter.     Shawnie Dapper, FNP-C 05/20/2022, 2:52 PM [facility] [address], [address] [place], [place] [address] [phone]

## 2022-05-20 NOTE — Patient Instructions (Incomplete)
Please continue using your CPAP regularly. While your insurance requires that you use CPAP at least 4 hours each night on 70% of the nights, I recommend, that you not skip any nights and use it throughout the night if you can. Getting used to CPAP and staying with the treatment long term does take time and patience and discipline. Untreated obstructive sleep apnea when it is moderate to severe can have an adverse impact on cardiovascular health and raise her risk for heart disease, arrhythmias, hypertension, congestive heart failure, stroke and diabetes. Untreated obstructive sleep apnea causes sleep disruption, nonrestorative sleep, and sleep deprivation. This can have an impact on your day to day functioning and cause daytime sleepiness and impairment of cognitive function, memory loss, mood disturbance, and problems focussing. Using CPAP regularly can improve these symptoms.   Download data using instructions provided. We will attempt to get compliance data in 2-3 days.   Follow up in 1 year

## 2022-05-21 ENCOUNTER — Encounter: Payer: Self-pay | Admitting: Family Medicine

## 2022-05-21 ENCOUNTER — Ambulatory Visit (INDEPENDENT_AMBULATORY_CARE_PROVIDER_SITE_OTHER): Admitting: Family Medicine

## 2022-05-21 VITALS — BP 139/75 | HR 72 | Ht 67.0 in | Wt 223.0 lb

## 2022-05-21 DIAGNOSIS — G4733 Obstructive sleep apnea (adult) (pediatric): Secondary | ICD-10-CM

## 2022-05-22 ENCOUNTER — Encounter: Payer: Self-pay | Admitting: Neurology

## 2022-05-22 ENCOUNTER — Telehealth: Payer: Self-pay | Admitting: Family Medicine

## 2022-05-22 NOTE — Telephone Encounter (Signed)
Noted thanks. Will send Stephanie Joseph the data and if she recommends any changes I will call her back.

## 2022-05-22 NOTE — Telephone Encounter (Signed)
Called pt back. Advised she should reach out to [employer] [facility] so they can help troubleshoot this for her. She confirmed she has their phone number and will reach out to them.

## 2022-05-22 NOTE — Telephone Encounter (Signed)
Pt called back and stated that she was able to get the QR code. She states if RN needs to ball her back she will be by her phone.

## 2022-05-22 NOTE — Telephone Encounter (Signed)
Pt has called to report that the paper from Amy, NP with the QR code will not work, pt's daughter gets message that states error.

## 2022-05-26 NOTE — Telephone Encounter (Signed)
Per Amy, NP: " Let her know that report looks pretty good. Daily compliance is optimal at 86% but 4 hour usage is a little low at 66%. Apnea is well managed. Remind her to use CPAP nightly for at least 4 hours. We will see her in 1 year. TY!    I called patient. No answer, left a message asking her to call us back. I will also send her a mychart message. If patient calls back another day please route to POD 1.

## 2022-05-26 NOTE — Telephone Encounter (Signed)
Pt returned phone, would like a call back. 

## 2022-05-26 NOTE — Telephone Encounter (Signed)
Called and spoke with pt. Relayed AL,NP message. Pt verbalized understanding.

## 2022-05-26 NOTE — Telephone Encounter (Signed)
I called patient again. No answer, left a message asking her to call us back.

## 2022-05-30 ENCOUNTER — Other Ambulatory Visit: Payer: Self-pay | Admitting: Emergency Medicine

## 2022-05-30 DIAGNOSIS — E119 Type 2 diabetes mellitus without complications: Secondary | ICD-10-CM

## 2022-06-23 ENCOUNTER — Other Ambulatory Visit: Payer: Self-pay | Admitting: Emergency Medicine

## 2022-06-23 DIAGNOSIS — E1159 Type 2 diabetes mellitus with other circulatory complications: Secondary | ICD-10-CM

## 2022-06-23 DIAGNOSIS — I10 Essential (primary) hypertension: Secondary | ICD-10-CM

## 2022-06-23 DIAGNOSIS — R42 Dizziness and giddiness: Secondary | ICD-10-CM

## 2022-07-03 ENCOUNTER — Ambulatory Visit: Admitting: Emergency Medicine

## 2022-07-10 ENCOUNTER — Ambulatory Visit (INDEPENDENT_AMBULATORY_CARE_PROVIDER_SITE_OTHER): Admitting: Emergency Medicine

## 2022-07-10 ENCOUNTER — Encounter: Payer: Self-pay | Admitting: Emergency Medicine

## 2022-07-10 VITALS — BP 132/76 | HR 78 | Temp 98.4°F | Ht 67.0 in | Wt 221.4 lb

## 2022-07-10 DIAGNOSIS — F17200 Nicotine dependence, unspecified, uncomplicated: Secondary | ICD-10-CM

## 2022-07-10 DIAGNOSIS — I152 Hypertension secondary to endocrine disorders: Secondary | ICD-10-CM

## 2022-07-10 DIAGNOSIS — M25552 Pain in left hip: Secondary | ICD-10-CM | POA: Diagnosis not present

## 2022-07-10 DIAGNOSIS — E1159 Type 2 diabetes mellitus with other circulatory complications: Secondary | ICD-10-CM | POA: Diagnosis not present

## 2022-07-10 DIAGNOSIS — G8929 Other chronic pain: Secondary | ICD-10-CM

## 2022-07-10 DIAGNOSIS — E785 Hyperlipidemia, unspecified: Secondary | ICD-10-CM | POA: Diagnosis not present

## 2022-07-10 DIAGNOSIS — Z8639 Personal history of other endocrine, nutritional and metabolic disease: Secondary | ICD-10-CM

## 2022-07-10 DIAGNOSIS — E1169 Type 2 diabetes mellitus with other specified complication: Secondary | ICD-10-CM

## 2022-07-10 LAB — COMPREHENSIVE METABOLIC PANEL
ALT: 11 U/L (ref 0–35)
AST: 15 U/L (ref 0–37)
Albumin: 3.8 g/dL (ref 3.5–5.2)
Alkaline Phosphatase: 74 U/L (ref 39–117)
BUN: 15 mg/dL (ref 6–23)
CO2: 30 mEq/L (ref 19–32)
Calcium: 8.9 mg/dL (ref 8.4–10.5)
Chloride: 102 mEq/L (ref 96–112)
Creatinine, Ser: 0.88 mg/dL (ref 0.40–1.20)
GFR: 70.79 mL/min (ref >60.00)
Glucose, Bld: 119 mg/dL — ABNORMAL HIGH (ref 70–99)
Potassium: 3.8 mEq/L (ref 3.5–5.1)
Sodium: 138 mEq/L (ref 135–145)
Total Bilirubin: 0.3 mg/dL (ref 0.2–1.2)
Total Protein: 6.7 g/dL (ref 6.0–8.3)

## 2022-07-10 LAB — VITAMIN B12: Vitamin B-12: 287 pg/mL (ref 211–911)

## 2022-07-10 LAB — LIPID PANEL
Cholesterol: 105 mg/dL (ref 0–200)
HDL: 53.3 mg/dL (ref >39.00)
LDL Cholesterol: 34 mg/dL (ref 0–99)
NonHDL: 51.35
Total CHOL/HDL Ratio: 2
Triglycerides: 88 mg/dL (ref 0.0–149.0)
VLDL: 17.6 mg/dL (ref 0.0–40.0)

## 2022-07-10 LAB — POCT GLYCOSYLATED HEMOGLOBIN (HGB A1C): Hemoglobin A1C: 6.5 % — AB (ref 4.0–5.6)

## 2022-07-10 LAB — VITAMIN D 25 HYDROXY (VIT D DEFICIENCY, FRACTURES): VITD: 34.96 ng/mL (ref 30.00–100.00)

## 2022-07-10 NOTE — Assessment & Plan Note (Signed)
Stable and well controlled. 

## 2022-07-10 NOTE — Patient Instructions (Signed)

## 2022-07-10 NOTE — Assessment & Plan Note (Signed)
Smoking less. Cardiovascular and cancer risk associated with smoking discussed. Smoking cessation advice given.

## 2022-07-10 NOTE — Assessment & Plan Note (Signed)
Well-controlled hypertension. Continue Hyzaar 100-12.5 mg and amlodipine 10 mg daily Well-controlled diabetes with hemoglobin A1c of 6.5. Continue Mounjaro 5 mg weekly along with glimepiride 2 mg daily Cardiovascular risk associated with hypertension and diabetes discussed. Diet and nutrition discussed. Follow-up in 6 months.

## 2022-07-10 NOTE — Progress Notes (Signed)
Stephanie Joseph 62 y.o.   Chief Complaint  Patient presents with   Follow-up    F/u DM , no concerns     HISTORY OF PRESENT ILLNESS: This is a 63 y.o. female  here for 7-month follow-up of diabetes, hypertension, dyslipidemia Overall doing well.  Has no complaints or medical concerns today.  HPI   Prior to Admission medications   Medication Sig Start Date End Date Taking? Authorizing Provider  amLODipine (NORVASC) 10 MG tablet TAKE ONE TABLET BY MOUTH EVERY DAY 06/23/22  Yes Fernanda Twaddell, Ines Bloomer, MD  atorvastatin (LIPITOR) 10 MG tablet TAKE ONE TABLET BY MOUTH EVERY DAY 06/01/22  Yes Loman Logan, Ines Bloomer, MD  brimonidine-timolol (COMBIGAN) 0.2-0.5 % ophthalmic solution Place 1 drop into both eyes every 12 (twelve) hours.   Yes [provider]  busPIRone (BUSPAR) 10 MG tablet Take 1 tablet (10 mg total) by mouth 3 (three) times daily. 04/01/22  Yes Kalep Full, Ines Bloomer, MD  estrogen, conjugated,-medroxyprogesterone (PREMPRO) 0.3-1.5 MG tablet Take 1 tablet by mouth daily. 09/26/21  Yes Griffin Basil, MD  glimepiride (AMARYL) 2 MG tablet TAKE ONE TABLET BY MOUTH EVERY DAY WITH BREAKFAST 06/23/22  Yes Gladstone Rosas, Ines Bloomer, MD  glucose blood (ACCU-CHEK AVIVA) test strip Use to check blood sugars 1-3 times daily. Dx Code: E11.9 01/20/18  Yes Marrian Salvage, FNP  hydrOXYzine (ATARAX) 25 MG tablet Take 1 tablet (25 mg total) by mouth every 8 (eight) hours as needed for itching. 04/01/22  Yes Layden Caterino, Ines Bloomer, MD  Lancets (ACCU-CHEK SOFT TOUCH) lancets Use as instructed 05/21/17  Yes Dragnev, Caesar Chestnut, NP  losartan-hydrochlorothiazide (HYZAAR) 100-12.5 MG tablet Take 1 tablet by mouth daily. 05/03/21  Yes Ayden Apodaca, Ines Bloomer, MD  methocarbamol (ROBAXIN) 500 MG tablet TAKE ONE TABLET BY MOUTH EVERY 8 HOURS AS NEEDED FOR MUSCLE SPASMS 12/30/21  Yes Shahid Flori, Ines Bloomer, MD  Misc. Devices (CANE) MISC Use as needed for left knee pain. 01/29/18  Yes  Dragnev, Caesar Chestnut, NP  naproxen (NAPROSYN) 375 MG tablet Take 1 tablet (375 mg total) by mouth 2 (two) times daily as needed for moderate pain. 12/09/21  Yes Lorenda Peck, DPM  QUEtiapine Fumarate (SEROQUEL XR) 150 MG 24 hr tablet Take 1 tablet (150 mg total) by mouth at bedtime. 10/10/21  Yes Penn, Cicely, NP  tirzepatide [facility]) 5 MG/0.5ML Pen Inject 5 mg into the skin once a week. 12/30/21  Yes Ranferi Clingan, Ines Bloomer, MD  TRAVEL SICKNESS 25 MG CHEW CHEW ONE TABLET BY MOUTH THREE TIMES A DAY - AS NEEDED FOR DIZZINESS (TABLETS MAY BE CHEWED OR SWALLOWED WHOLE) 06/23/22  Yes Horald Pollen, MD  venlafaxine (EFFEXOR) 75 MG tablet Take 1 tablet (75 mg total) by mouth 3 (three) times daily with meals. 09/26/21  Yes Penn, Lunette Stands, NP  fluticasone (FLONASE) 50 MCG/ACT nasal spray Place 2 sprays into both nostrils daily. Patient not taking: Reported on 05/21/2022 05/13/21   Michela Pitcher, NP    No Known Allergies  Patient Active Problem List   Diagnosis Date Noted   Chronic left hip pain 04/01/2022   Hot flashes due to menopause 09/24/2021   Dizziness 07/31/2021   Chronic midline low back pain 07/12/2018   Glaucoma 05/21/2017   Class 1 obesity due to excess calories with serious comorbidity and body mass index (BMI) of 33.0 to 33.9 in adult 09/19/2016   Vitamin D deficiency 09/19/2016   Dyslipidemia associated with type 2 diabetes mellitus ([place]) 07/10/2015   Hormone replacement therapy (postmenopausal)  10/04/2014   Mitral regurgitation 11/05/2012   Valvular heart disease 10/21/2011   Menorrhagia, premenopausal 09/01/2011   Anxiety and depression 03/04/2010   GOUT 12/16/2006   ANEMIA, IRON DEFICIENCY NOS 12/16/2006   Current smoker 12/16/2006   Hypertension associated with diabetes (HCC) 12/16/2006    Past Medical History:  Diagnosis Date   Anemia, iron deficiency    Arthritis    Depression    Diabetes mellitus without complication (HCC)    Dysmenorrhea    Gout     Heart valve problem    Hypertension    Menorrhagia    Mitral regurgitation    Pneumonia     Past Surgical History:  Procedure Laterality Date   HAMMER TOE SURGERY     KNEE ARTHROSCOPY     Left    Social History   Socioeconomic History   Marital status: Married    Spouse name: Not on file   Number of children: 1   Years of education: 12   Highest education level: Not on file  Occupational History   Occupation: Disability Pending  Tobacco Use   Smoking status: Every Day    Packs/day: 0.50    Years: 7.00    Total pack years: 3.50    Types: Cigarettes   Smokeless tobacco: Never  Vaping Use   Vaping Use: Never used  Substance and Sexual Activity   Alcohol use: No   Drug use: Not Currently    Types: "Crack" cocaine   Sexual activity: Yes    Birth control/protection: None  Other Topics Concern   Not on file  Social History Narrative   Fun: Go to church   Denies abuse and feels safe at home.    Social Determinants of Health   Financial Resource Strain: Not on file  Food Insecurity: Not on file  Transportation Needs: Not on file  Physical Activity: Not on file  Stress: Not on file  Social Connections: Not on file  Intimate Partner Violence: Not on file    Family History  Problem Relation Age of Onset   Hypertension Mother    Stroke Paternal Grandmother    Heart disease Sister    Heart disease Brother      Review of Systems  Constitutional: Negative.  Negative for chills and fever.  HENT: Negative.  Negative for congestion and sore throat.   Respiratory: Negative.  Negative for cough and shortness of breath.   Cardiovascular: Negative.  Negative for chest pain and palpitations.  Gastrointestinal:  Negative for abdominal pain, diarrhea, nausea and vomiting.  Skin: Negative.  Negative for rash.  Neurological: Negative.  Negative for dizziness and headaches.  All other systems reviewed and are negative.   Today's Vitals   07/10/22 1008  BP: 132/76   Pulse: 78  Temp: 98.4 F (36.9 C)  TempSrc: Oral  SpO2: 96%  Weight: 221 lb 6 oz (100.4 kg)  Height: 5\' 7"  (1.702 m)   Body mass index is 34.67 kg/m. Wt Readings from Last 3 Encounters:  07/10/22 221 lb 6 oz (100.4 kg)  05/21/22 223 lb (101.2 kg)  04/01/22 218 lb 8 oz (99.1 kg)    Physical Exam Vitals reviewed.  Constitutional:      Appearance: Normal appearance.  HENT:     Head: Normocephalic.  Eyes:     Extraocular Movements: Extraocular movements intact.     Pupils: Pupils are equal, round, and reactive to light.  Cardiovascular:     Rate and Rhythm: Normal rate.  Pulmonary:  Effort: Pulmonary effort is normal.  Skin:    General: Skin is warm and dry.  Neurological:     Mental Status: She is alert and oriented to person, place, and time.  Psychiatric:        Mood and Affect: Mood normal.        Behavior: Behavior normal.    Results for orders placed or performed in visit on 07/10/22 (from the past 24 hour(s))  POCT HgB A1C     Status: Abnormal   Collection Time: 07/10/22 10:30 AM  Result Value Ref Range   Hemoglobin A1C 6.5 (A) 4.0 - 5.6 %   HbA1c POC (<> result, manual entry)     HbA1c, POC (prediabetic range)     HbA1c, POC (controlled diabetic range)       ASSESSMENT & PLAN: A total of 45 minutes was spent with the patient and counseling/coordination of care regarding preparing for this visit, review of most recent office visit notes, review of multiple chronic medical conditions under management, review of most recent blood work results including interpretation of today's hemoglobin A1c, cardiovascular risks associated with hypertension and diabetes, prognosis, documentation, and need for follow-up  Problem List Items Addressed This Visit       Cardiovascular and Mediastinum   Hypertension associated with diabetes (HCC) - Primary    Well-controlled hypertension. Continue Hyzaar 100-12.5 mg and amlodipine 10 mg daily Well-controlled diabetes with  hemoglobin A1c of 6.5. Continue Mounjaro 5 mg weekly along with glimepiride 2 mg daily Cardiovascular risk associated with hypertension and diabetes discussed. Diet and nutrition discussed. Follow-up in 6 months.      Relevant Orders   Comprehensive metabolic panel   Vitamin B12   POCT HgB A1C (Completed)     Endocrine   Dyslipidemia associated with type 2 diabetes mellitus (HCC)    Stable.  Diet and nutrition discussed. Continue atorvastatin 10 mg daily.       Relevant Orders   Lipid panel   Vitamin B12     Other   Current smoker    Smoking less. Cardiovascular and cancer risk associated with smoking discussed. Smoking cessation advice given.      Chronic left hip pain    Stable and well-controlled.      Other Visit Diagnoses     History of vitamin D deficiency       Relevant Orders   VITAMIN D 25 Hydroxy (Vit-D Deficiency, Fractures)      Patient Instructions  Diabetes Mellitus and Nutrition, Adult When you have diabetes, or diabetes mellitus, it is very important to have healthy eating habits because your blood sugar (glucose) levels are greatly affected by what you eat and drink. Eating healthy foods in the right amounts, at about the same times every day, can help you: Manage your blood glucose. Lower your risk of heart disease. Improve your blood pressure. Reach or maintain a healthy weight. What can affect my meal plan? Every person with diabetes is different, and each person has different needs for a meal plan. Your health care provider may recommend that you work with a dietitian to make a meal plan that is best for you. Your meal plan may vary depending on factors such as: The calories you need. The medicines you take. Your weight. Your blood glucose, blood pressure, and cholesterol levels. Your activity level. Other health conditions you have, such as heart or kidney disease. How do carbohydrates affect me? Carbohydrates, also called carbs, affect  your blood glucose level  more than any other type of food. Eating carbs raises the amount of glucose in your blood. It is important to know how many carbs you can safely have in each meal. This is different for every person. Your dietitian can help you calculate how many carbs you should have at each meal and for each snack. How does alcohol affect me? Alcohol can cause a decrease in blood glucose (hypoglycemia), especially if you use insulin or take certain diabetes medicines by mouth. Hypoglycemia can be a life-threatening condition. Symptoms of hypoglycemia, such as sleepiness, dizziness, and confusion, are similar to symptoms of having too much alcohol. Do not drink alcohol if: Your health care provider tells you not to drink. You are pregnant, may be pregnant, or are planning to become pregnant. If you drink alcohol: Limit how much you have to: 0-1 drink a day for women. 0-2 drinks a day for men. Know how much alcohol is in your drink. In the [place] one drink equals one 12 oz bottle of beer (355 mL), one 5 oz glass of wine (148 mL), or one 1 oz glass of hard liquor (44 mL). Keep yourself hydrated with water, diet soda, or unsweetened iced tea. Keep in mind that regular soda, juice, and other mixers may contain a lot of sugar and must be counted as carbs. What are tips for following this plan?  Reading food labels Start by checking the serving size on the Nutrition Facts label of packaged foods and drinks. The number of calories and the amount of carbs, fats, and other nutrients listed on the label are based on one serving of the item. Many items contain more than one serving per package. Check the total grams (g) of carbs in one serving. Check the number of grams of saturated fats and trans fats in one serving. Choose foods that have a low amount or none of these fats. Check the number of milligrams (mg) of salt (sodium) in one serving. Most people should limit total sodium intake to less  than 2,300 mg per day. Always check the nutrition information of foods labeled as "low-fat" or "nonfat." These foods may be higher in added sugar or refined carbs and should be avoided. Talk to your dietitian to identify your daily goals for nutrients listed on the label. Shopping Avoid buying canned, pre-made, or processed foods. These foods tend to be high in fat, sodium, and added sugar. Shop around the outside edge of the grocery store. This is where you will most often find fresh fruits and vegetables, bulk grains, fresh meats, and fresh dairy products. Cooking Use low-heat cooking methods, such as baking, instead of high-heat cooking methods, such as deep frying. Cook using healthy oils, such as olive, canola, or sunflower oil. Avoid cooking with butter, cream, or high-fat meats. Meal planning Eat meals and snacks regularly, preferably at the same times every day. Avoid going long periods of time without eating. Eat foods that are high in fiber, such as fresh fruits, vegetables, beans, and whole grains. Eat 4-6 oz (112-168 g) of lean protein each day, such as lean meat, chicken, fish, eggs, or tofu. One ounce (oz) (28 g) of lean protein is equal to: 1 oz (28 g) of meat, chicken, or fish. 1 egg.  cup (62 g) of tofu. Eat some foods each day that contain healthy fats, such as avocado, nuts, seeds, and fish. What foods should I eat? Fruits Berries. Apples. Oranges. Peaches. Apricots. Plums. Grapes. Mangoes. Papayas. Pomegranates. Kiwi. Cherries. Vegetables Leafy  greens, including lettuce, spinach, kale, chard, collard greens, mustard greens, and cabbage. Beets. Cauliflower. Broccoli. Carrots. Green beans. Tomatoes. Peppers. Onions. Cucumbers. Brussels sprouts. Grains Whole grains, such as whole-wheat or whole-grain bread, crackers, tortillas, cereal, and pasta. Unsweetened oatmeal. Quinoa. Brown or wild rice. Meats and other proteins Seafood. Poultry without skin. Lean cuts of poultry  and beef. Tofu. Nuts. Seeds. Dairy Low-fat or fat-free dairy products such as milk, yogurt, and cheese. The items listed above may not be a complete list of foods and beverages you can eat and drink. Contact a dietitian for more information. What foods should I avoid? Fruits Fruits canned with syrup. Vegetables Canned vegetables. Frozen vegetables with butter or cream sauce. Grains Refined white flour and flour products such as bread, pasta, snack foods, and cereals. Avoid all processed foods. Meats and other proteins Fatty cuts of meat. Poultry with skin. Breaded or fried meats. Processed meat. Avoid saturated fats. Dairy Full-fat yogurt, cheese, or milk. Beverages Sweetened drinks, such as soda or iced tea. The items listed above may not be a complete list of foods and beverages you should avoid. Contact a dietitian for more information. Questions to ask a health care provider Do I need to meet with a certified diabetes care and education specialist? Do I need to meet with a dietitian? What number can I call if I have questions? When are the best times to check my blood glucose? Where to find more information: American Diabetes Association: diabetes.org Academy of Nutrition and Dietetics: eatright.[employer] of Diabetes and Digestive and Kidney Diseases: StageSync.si Association of Diabetes Care & Education Specialists: diabeteseducator.org Summary It is important to have healthy eating habits because your blood sugar (glucose) levels are greatly affected by what you eat and drink. It is important to use alcohol carefully. A healthy meal plan will help you manage your blood glucose and lower your risk of heart disease. Your health care provider may recommend that you work with a dietitian to make a meal plan that is best for you. This information is not intended to replace advice given to you by your health care provider. Make sure you discuss any questions you have  with your health care provider. Document Revised: 01/25/2020 Document Reviewed: 01/25/2020 Elsevier Patient Education  2023 Elsevier Inc.    Edwina Barth, MD [facility]

## 2022-07-10 NOTE — Assessment & Plan Note (Signed)
Stable.  Diet and nutrition discussed.  Continue atorvastatin 10 mg daily. 

## 2022-07-22 ENCOUNTER — Ambulatory Visit (INDEPENDENT_AMBULATORY_CARE_PROVIDER_SITE_OTHER): Admitting: Podiatry

## 2022-07-22 ENCOUNTER — Encounter: Payer: Self-pay | Admitting: Podiatry

## 2022-07-22 DIAGNOSIS — Q828 Other specified congenital malformations of skin: Secondary | ICD-10-CM

## 2022-07-22 DIAGNOSIS — M19079 Primary osteoarthritis, unspecified ankle and foot: Secondary | ICD-10-CM | POA: Diagnosis not present

## 2022-07-22 DIAGNOSIS — M205X2 Other deformities of toe(s) (acquired), left foot: Secondary | ICD-10-CM

## 2022-07-22 MED ORDER — DEXAMETHASONE SODIUM PHOSPHATE 120 MG/30ML IJ SOLN
4.0000 mg | Freq: Once | INTRAMUSCULAR | Status: AC
  Administered 2022-07-22: 4 mg via INTRA_ARTICULAR

## 2022-07-22 NOTE — Progress Notes (Signed)
  Subjective:  Patient ID: Stephanie Joseph, female    DOB: 11/23/1960,   MRN: [id number]  Chief Complaint  Patient presents with   Toe Pain    Patient is here for left foot pinky toe pain, she has had pain for 1-2 months ago.patient is requesting injection.    62 y.o. female presents for concern of right foot pain that has been going on for about a month. Relates it is painful when walking and when sitting. She relates a history of hammertoe surgery and bunion surgery in the past. Relates she does take medication and not sure what is helping for the pain. Relates most of the pain is on the top of the foot.  . Denies any other pedal complaints. Denies n/v/f/c.   Past Medical History:  Diagnosis Date   Anemia, iron deficiency    Arthritis    Depression    Diabetes mellitus without complication ([place])    Dysmenorrhea    Gout    Heart valve problem    Hypertension    Menorrhagia    Mitral regurgitation    Pneumonia     Objective:  Physical Exam: Vascular: DP/PT pulses 2/4 bilateral. CFT <3 seconds. Normal hair growth on digits. No edema.  Skin. No lacerations or abrasions bilateral feet.  Musculoskeletal: MMT 5/5 bilateral lower extremities in DF, PF, Inversion and Eversion. Deceased ROM in DF of ankle joint. Tender to palpation over TMTJ and [place] joints on dorsal right foot. No pain otherwise to palpation.  Neurological: Sensation intact to light touch.   Assessment:   1. Arthritis of midfoot   2. Porokeratosis   3. Adductovarus rotation of toe, acquired, left      Plan:  Patient was evaluated and treated and all questions answered. Discussed midfoot arthritis with patient and treatment options.  X-rays reviewed. No acute fractures or dislocations. Degenerative changes noted throughout midfoot.  Discussed NSAIDS, topicals, and possible injections.  Patient requesting injection of right foot today. Procedure below.  Discussed stiff soled shoes and carbon fiber foot  plate.  Discussed if pain does not improve can discuss surgical options.  -Discussed porokeratosis  with patient and treatment options.  -Hyperkeratotic tissue was debrided with chisel without incident.  -Applied salycylic acid treatment to area with dressing. Advised to remove bandaging tomorrow.  -Encouraged daily moisturizing -Discussed use of pumice stone -Advised good supportive shoes and inserts -Patient to return to office as needed or sooner if condition worsens.     Procedure: Injection Tendon/Ligament Discussed alternatives, risks, complications and verbal consent was obtained.  Location: Right second tarsometatarsal joint. Skin Prep: Alcohol. Injectate: 1cc 0.5% marcaine plain, 1 cc dexamethasone.  Disposition: Patient tolerated procedure well. Injection site dressed with a band-aid.  Post-injection care was discussed and return precautions discussed.    Lorenda Peck, DPM

## 2022-07-30 ENCOUNTER — Other Ambulatory Visit: Payer: Self-pay | Admitting: Emergency Medicine

## 2022-07-30 DIAGNOSIS — Z1231 Encounter for screening mammogram for malignant neoplasm of breast: Secondary | ICD-10-CM

## 2022-08-03 ENCOUNTER — Telehealth: Admitting: Physician Assistant

## 2022-08-03 ENCOUNTER — Telehealth

## 2022-08-03 ENCOUNTER — Telehealth: Admitting: Urgent Care

## 2022-08-03 DIAGNOSIS — J209 Acute bronchitis, unspecified: Secondary | ICD-10-CM | POA: Diagnosis not present

## 2022-08-03 DIAGNOSIS — J069 Acute upper respiratory infection, unspecified: Secondary | ICD-10-CM

## 2022-08-03 MED ORDER — ALBUTEROL SULFATE HFA 108 (90 BASE) MCG/ACT IN AERS: 2.0000 | INHALATION_SPRAY | Freq: Four times a day (QID) | RESPIRATORY_TRACT | 0 refills | Status: DC | PRN

## 2022-08-03 MED ORDER — PREDNISONE 20 MG PO TABS: 20.0000 mg | ORAL_TABLET | Freq: Every day | ORAL | 0 refills | Status: AC

## 2022-08-03 MED ORDER — BENZONATATE 100 MG PO CAPS: 100.0000 mg | ORAL_CAPSULE | Freq: Three times a day (TID) | ORAL | 0 refills | Status: DC | PRN

## 2022-08-03 MED ORDER — AZELASTINE HCL 0.1 % NA SOLN: 1.0000 | Freq: Two times a day (BID) | NASAL | 0 refills | Status: AC

## 2022-08-03 MED ORDER — BENZONATATE 100 MG PO CAPS
100.0000 mg | ORAL_CAPSULE | Freq: Two times a day (BID) | ORAL | 0 refills | Status: DC | PRN
Start: 2022-08-03 — End: 2022-12-01

## 2022-08-03 NOTE — Progress Notes (Signed)
Virtual Visit Consent   Stephanie Joseph, you are scheduled for a virtual visit with a [facility] provider today. Just as with appointments in the office, your consent must be obtained to participate. Your consent will be active for this visit and any virtual visit you may have with one of our providers in the next 365 days. If you have a MyChart account, a copy of this consent can be sent to you electronically.  As this is a virtual visit, video technology does not allow for your provider to perform a traditional examination. This may limit your provider's ability to fully assess your condition. If your provider identifies any concerns that need to be evaluated in person or the need to arrange testing (such as labs, EKG, etc.), we will make arrangements to do so. Although advances in technology are sophisticated, we cannot ensure that it will always work on either your end or our end. If the connection with a video visit is poor, the visit may have to be switched to a telephone visit. With either a video or telephone visit, we are not always able to ensure that we have a secure connection.  By engaging in this virtual visit, you consent to the provision of healthcare and authorize for your insurance to be billed (if applicable) for the services provided during this visit. Depending on your insurance coverage, you may receive a charge related to this service.  I need to obtain your verbal consent now. Are you willing to proceed with your visit today? Stephanie Joseph has provided verbal consent on 08/03/2022 for a virtual visit (video or telephone). Stephanie Malling, PA  Date: 08/03/2022 3:19 PM  Virtual Visit via Video Note   I, [name], connected with  Stephanie Joseph  ([id number], [birth year]) on 08/03/22 at  2:30 PM EST by a video-enabled telemedicine application and verified that I am speaking with the correct person using two identifiers.  Location: Patient:  Virtual Visit Location Patient: Home Provider: Virtual Visit Location Provider: Home Office   I discussed the limitations of evaluation and management by telemedicine and the availability of in person appointments. The patient expressed understanding and agreed to proceed.    History of Present Illness: Stephanie Joseph is a 62 y.o. who identifies as a female who was assigned female at birth, and is being seen today for cough.  HPI: Pleasant 62yo female with hx of heart disease and diabetes presents today due to cough x 3 days. Denies any known sick contacts. Pt is a smoker, but denies dx of COPD or asthma. States over the past two days she has had an intense cough which is deep and relatively non-productive. States she is able to get stuff up "every once in a while, but takes a lot of work to do so." Also has a headache and body aches. Denies a known fever but has had chills. States she can hear herself wheeze. Denies ear pain, scratchy through, sinus congestion, sneezing or rhinorrhea. Pt just took home covid test which was negative.   Problems:  Patient Active Problem List   Diagnosis Date Noted   Chronic left hip pain 04/01/2022   Hot flashes due to menopause 09/24/2021   Dizziness 07/31/2021   Chronic midline low back pain 07/12/2018   Glaucoma 05/21/2017   Class 1 obesity due to excess calories with serious comorbidity and body mass index (BMI) of 33.0 to 33.9 in adult 09/19/2016   Vitamin D deficiency 09/19/2016  Dyslipidemia associated with type 2 diabetes mellitus (HCC) 07/10/2015   Hormone replacement therapy (postmenopausal) 10/04/2014   Mitral regurgitation 11/05/2012   Valvular heart disease 10/21/2011   Menorrhagia, premenopausal 09/01/2011   Anxiety and depression 03/04/2010   GOUT 12/16/2006   ANEMIA, IRON DEFICIENCY NOS 12/16/2006   Current smoker 12/16/2006   Hypertension associated with diabetes (HCC) 12/16/2006    Allergies: No Known  Allergies Medications:  Current Outpatient Medications:    albuterol (VENTOLIN HFA) 108 (90 Base) MCG/ACT inhaler, Inhale 2 puffs into the lungs every 6 (six) hours as needed for wheezing or shortness of breath., Disp: 8 g, Rfl: 0   benzonatate (TESSALON) 100 MG capsule, Take 1 capsule (100 mg total) by mouth 2 (two) times daily as needed for cough., Disp: 20 capsule, Rfl: 0   predniSONE (DELTASONE) 20 MG tablet, Take 1 tablet (20 mg total) by mouth daily with breakfast for 4 days., Disp: 4 tablet, Rfl: 0   amLODipine (NORVASC) 10 MG tablet, TAKE ONE TABLET BY MOUTH EVERY DAY, Disp: 90 tablet, Rfl: 3   atorvastatin (LIPITOR) 10 MG tablet, TAKE ONE TABLET BY MOUTH EVERY DAY, Disp: 90 tablet, Rfl: 3   azelastine (ASTELIN) 0.1 % nasal spray, Place 1 spray into both nostrils 2 (two) times daily. Use in each nostril as directed, Disp: 30 mL, Rfl: 0   brimonidine-timolol (COMBIGAN) 0.2-0.5 % ophthalmic solution, Place 1 drop into both eyes every 12 (twelve) hours., Disp: , Rfl:    estrogen, conjugated,-medroxyprogesterone (PREMPRO) 0.3-1.5 MG tablet, Take 1 tablet by mouth daily., Disp: 30 tablet, Rfl: 8   fluticasone (FLONASE) 50 MCG/ACT nasal spray, Place 2 sprays into both nostrils daily., Disp: 16 g, Rfl: 6   glimepiride (AMARYL) 2 MG tablet, TAKE ONE TABLET BY MOUTH EVERY DAY WITH BREAKFAST, Disp: 90 tablet, Rfl: 1   glucose blood (ACCU-CHEK AVIVA) test strip, Use to check blood sugars 1-3 times daily. Dx Code: E11.9, Disp: 300 each, Rfl: 1   hydrOXYzine (ATARAX) 25 MG tablet, Take 1 tablet (25 mg total) by mouth every 8 (eight) hours as needed for itching., Disp: 360 tablet, Rfl: 1   Lancets (ACCU-CHEK SOFT TOUCH) lancets, Use as instructed, Disp: 100 each, Rfl: 12   losartan-hydrochlorothiazide (HYZAAR) 100-12.5 MG tablet, Take 1 tablet by mouth daily., Disp: 90 tablet, Rfl: 3   Misc. Devices (CANE) MISC, Use as needed for left knee pain., Disp: 1 each, Rfl: 0   naproxen (NAPROSYN) 375 MG tablet,  Take 1 tablet (375 mg total) by mouth 2 (two) times daily as needed for moderate pain., Disp: 180 tablet, Rfl: 0   QUEtiapine Fumarate (SEROQUEL XR) 150 MG 24 hr tablet, Take 1 tablet (150 mg total) by mouth at bedtime., Disp: 30 tablet, Rfl: 2   tirzepatide (MOUNJARO) 5 MG/0.5ML Pen, Inject 5 mg into the skin once a week., Disp: 6 mL, Rfl: 3   TRAVEL SICKNESS 25 MG CHEW, CHEW ONE TABLET BY MOUTH THREE TIMES A DAY - AS NEEDED FOR DIZZINESS (TABLETS MAY BE CHEWED OR SWALLOWED WHOLE), Disp: 30 tablet, Rfl: 1   venlafaxine (EFFEXOR) 75 MG tablet, Take 1 tablet (75 mg total) by mouth 3 (three) times daily with meals., Disp: 270 tablet, Rfl: 1  Observations/Objective: Patient is well-developed, well-nourished in no acute distress.  Resting comfortably at home. Pt appears non-toxic. Head is normocephalic, atraumatic.  No labored breathing. No coughing during encounter. Speech is clear and coherent with logical content.  Patient is alert and oriented at baseline.  No scleral  injection, no rhinorrhea, no rash  Assessment and Plan: 1. Acute bronchitis, unspecified organism  Pt with what appears to be viral bronchitis. Does have DM, last A1C 2 weeks ago was 6.5%, she reports excellent glycemic control. Will do short course of low dose prednisone, PRN tessalon and PRN albuterol.   Follow Up Instructions: I discussed the assessment and treatment plan with the patient. The patient was provided an opportunity to ask questions and all were answered. The patient agreed with the plan and demonstrated an understanding of the instructions.  A copy of instructions were sent to the patient via MyChart unless otherwise noted below.    The patient was advised to call back or seek an in-person evaluation if the symptoms worsen or if the condition fails to improve as anticipated.  Time:  I spent 9 minutes with the patient via telehealth technology discussing the above problems/concerns.    [place], PA

## 2022-08-03 NOTE — Patient Instructions (Signed)
Perrin Smack, thank you for joining [name], PA for today's virtual visit.  While this provider is not your primary care provider (PCP), if your PCP is located in our provider database this encounter information will be shared with them immediately following your visit.   [facility] account gives you access to today's visit and all your visits, tests, and labs performed at [facility] " click here if you don't have a [facility] account or go to [email or web address]  Consent: (Patient) Stephanie Joseph provided verbal consent for this virtual visit at the beginning of the encounter.  Current Medications:  Current Outpatient Medications:    albuterol (VENTOLIN HFA) 108 (90 Base) MCG/ACT inhaler, Inhale 2 puffs into the lungs every 6 (six) hours as needed for wheezing or shortness of breath., Disp: 8 g, Rfl: 0   benzonatate (TESSALON) 100 MG capsule, Take 1 capsule (100 mg total) by mouth 2 (two) times daily as needed for cough., Disp: 20 capsule, Rfl: 0   predniSONE (DELTASONE) 20 MG tablet, Take 1 tablet (20 mg total) by mouth daily with breakfast for 4 days., Disp: 4 tablet, Rfl: 0   amLODipine (NORVASC) 10 MG tablet, TAKE ONE TABLET BY MOUTH EVERY DAY, Disp: 90 tablet, Rfl: 3   atorvastatin (LIPITOR) 10 MG tablet, TAKE ONE TABLET BY MOUTH EVERY DAY, Disp: 90 tablet, Rfl: 3   azelastine (ASTELIN) 0.1 % nasal spray, Place 1 spray into both nostrils 2 (two) times daily. Use in each nostril as directed, Disp: 30 mL, Rfl: 0   brimonidine-timolol (COMBIGAN) 0.2-0.5 % ophthalmic solution, Place 1 drop into both eyes every 12 (twelve) hours., Disp: , Rfl:    estrogen, conjugated,-medroxyprogesterone (PREMPRO) 0.3-1.5 MG tablet, Take 1 tablet by mouth daily., Disp: 30 tablet, Rfl: 8   fluticasone (FLONASE) 50 MCG/ACT nasal spray, Place 2 sprays into both nostrils daily., Disp: 16 g, Rfl: 6   glimepiride (AMARYL) 2 MG tablet, TAKE ONE  TABLET BY MOUTH EVERY DAY WITH BREAKFAST, Disp: 90 tablet, Rfl: 1   glucose blood (ACCU-CHEK AVIVA) test strip, Use to check blood sugars 1-3 times daily. Dx Code: E11.9, Disp: 300 each, Rfl: 1   hydrOXYzine (ATARAX) 25 MG tablet, Take 1 tablet (25 mg total) by mouth every 8 (eight) hours as needed for itching., Disp: 360 tablet, Rfl: 1   Lancets (ACCU-CHEK SOFT TOUCH) lancets, Use as instructed, Disp: 100 each, Rfl: 12   losartan-hydrochlorothiazide (HYZAAR) 100-12.5 MG tablet, Take 1 tablet by mouth daily., Disp: 90 tablet, Rfl: 3   Misc. Devices (CANE) MISC, Use as needed for left knee pain., Disp: 1 each, Rfl: 0   naproxen (NAPROSYN) 375 MG tablet, Take 1 tablet (375 mg total) by mouth 2 (two) times daily as needed for moderate pain., Disp: 180 tablet, Rfl: 0   QUEtiapine Fumarate (SEROQUEL XR) 150 MG 24 hr tablet, Take 1 tablet (150 mg total) by mouth at bedtime., Disp: 30 tablet, Rfl: 2   tirzepatide (MOUNJARO) 5 MG/0.5ML Pen, Inject 5 mg into the skin once a week., Disp: 6 mL, Rfl: 3   TRAVEL SICKNESS 25 MG CHEW, CHEW ONE TABLET BY MOUTH THREE TIMES A DAY - AS NEEDED FOR DIZZINESS (TABLETS MAY BE CHEWED OR SWALLOWED WHOLE), Disp: 30 tablet, Rfl: 1   venlafaxine (EFFEXOR) 75 MG tablet, Take 1 tablet (75 mg total) by mouth 3 (three) times daily with meals., Disp: 270 tablet, Rfl: 1   Medications ordered in this encounter:  Meds ordered this  encounter  Medications   predniSONE (DELTASONE) 20 MG tablet    Sig: Take 1 tablet (20 mg total) by mouth daily with breakfast for 4 days.    Dispense:  4 tablet    Refill:  0    Order Specific Question:   Supervising Provider    Answer:   Chase Picket [[id number]]   benzonatate (TESSALON) 100 MG capsule    Sig: Take 1 capsule (100 mg total) by mouth 2 (two) times daily as needed for cough.    Dispense:  20 capsule    Refill:  0    Order Specific Question:   Supervising Provider    Answer:   Bari Mantis   albuterol (VENTOLIN HFA)  108 (90 Base) MCG/ACT inhaler    Sig: Inhale 2 puffs into the lungs every 6 (six) hours as needed for wheezing or shortness of breath.    Dispense:  8 g    Refill:  0    Order Specific Question:   Supervising Provider    Answer:   Chase Picket [id number]     *If you need refills on other medications prior to your next appointment, please contact your pharmacy*  Follow-Up: Call back or seek an in-person evaluation if the symptoms worsen or if the condition fails to improve as anticipated.  [place] [phone]  Other Instructions I suspect you have viral bronchitis. Please read the attached patient instructions to see things you can do at home to improve your symptoms. Start taking prednisone once daily in the morning with breakfast. Take this for four days, monitor your sugars while taking prednisone. Use the tessalon pearles up to every 8 hours as needed for cough. Exposure to cool air (such as from your freezer) can sometimes help with upper airway inflammation. Rest and increase water intake. For any chest tightness or wheezing, please use your albuterol inhaler. If you develop any severe shortness of breath, chest pain or uncontrolled fevers, please head to the Emergency Room    If you have been instructed to have an in-person evaluation today at a local Urgent Care facility, please use the link below. It will take you to a list of all of our available [facility], including address, phone number and hours of operation. Please do not delay care.  [facility]  If you or a family member do not have a primary care provider, use the link below to schedule a visit and establish care. When you choose a [facility] primary care physician or advanced practice provider, you gain a long-term partner in health. Find a Primary Care Provider  Learn more about [facility] in-office and virtual care options: [facility]

## 2022-08-03 NOTE — Progress Notes (Signed)
E-Visit for Upper Respiratory Infection   We are sorry you are not feeling well.  Here is how we plan to help!  Based on what you have shared with me, it looks like you may have a viral upper respiratory infection.  Upper respiratory infections are caused by a large number of viruses; however, rhinovirus is the most common cause.   Symptoms vary from person to person, with common symptoms including sore throat, cough, fatigue or lack of energy and feeling of general discomfort.  A low-grade fever of up to 100.4 may present, but is often uncommon.  Symptoms vary however, and are closely related to a person's age or underlying illnesses.  The most common symptoms associated with an upper respiratory infection are nasal discharge or congestion, cough, sneezing, headache and pressure in the ears and face.  These symptoms usually persist for about 3 to 10 days, but can last up to 2 weeks.  It is important to know that upper respiratory infections do not cause serious illness or complications in most cases.    Upper respiratory infections can be transmitted from person to person, with the most common method of transmission being a person's hands.  The virus is able to live on the skin and can infect other persons for up to 2 hours after direct contact.  Also, these can be transmitted when someone coughs or sneezes; thus, it is important to cover the mouth to reduce this risk.  To keep the spread of the illness at bay, good hand hygiene is very important.  This is an infection that is most likely caused by a virus. There are no specific treatments other than to help you with the symptoms until the infection runs its course.  We are sorry you are not feeling well.  Here is how we plan to help!   For nasal congestion, you may use an oral decongestants such as Mucinex D or if you have glaucoma or high blood pressure use plain Mucinex.  Saline nasal spray or nasal drops can help and can safely be used as often as  needed for congestion.  For your congestion, I have prescribed Azelastine nasal spray two sprays in each nostril twice a day  If you do not have a history of heart disease, hypertension, diabetes or thyroid disease, prostate/bladder issues or glaucoma, you may also use Sudafed to treat nasal congestion.  It is highly recommended that you consult with a pharmacist or your primary care physician to ensure this medication is safe for you to take.     If you have a cough, you may use cough suppressants such as Delsym and Robitussin.  If you have glaucoma or high blood pressure, you can also use Coricidin HBP.   For cough I have prescribed for you A prescription cough medication called Tessalon Perles 100 mg. You may take 1-2 capsules every 8 hours as needed for cough  If you have a sore or scratchy throat, use a saltwater gargle-  to  teaspoon of salt dissolved in a 4-ounce to 8-ounce glass of warm water.  Gargle the solution for approximately 15-30 seconds and then spit.  It is important not to swallow the solution.  You can also use throat lozenges/cough drops and Chloraseptic spray to help with throat pain or discomfort.  Warm or cold liquids can also be helpful in relieving throat pain.  For headache, pain or general discomfort, you can use Ibuprofen or Tylenol as directed.   Some authorities believe   that zinc sprays or the use of Echinacea may shorten the course of your symptoms.   HOME CARE Only take medications as instructed by your medical team. Be sure to drink plenty of fluids. Water is fine as well as fruit juices, sodas and electrolyte beverages. You may want to stay away from caffeine or alcohol. If you are nauseated, try taking small sips of liquids. How do you know if you are getting enough fluid? Your urine should be a pale yellow or almost colorless. Get rest. Taking a steamy shower or using a humidifier may help nasal congestion and ease sore throat pain. You can place a towel over  your head and breathe in the steam from hot water coming from a faucet. Using a saline nasal spray works much the same way. Cough drops, hard candies and sore throat lozenges may ease your cough. Avoid close contacts especially the very young and the elderly Cover your mouth if you cough or sneeze Always remember to wash your hands.   GET HELP RIGHT AWAY IF: You develop worsening fever. If your symptoms do not improve within 10 days You develop yellow or green discharge from your nose over 3 days. You have coughing fits You develop a severe head ache or visual changes. You develop shortness of breath, difficulty breathing or start having chest pain Your symptoms persist after you have completed your treatment plan  MAKE SURE YOU  Understand these instructions. Will watch your condition. Will get help right away if you are not doing well or get worse.  Thank you for choosing an e-visit.  Your e-visit answers were reviewed by a board certified advanced clinical practitioner to complete your personal care plan. Depending upon the condition, your plan could have included both over the counter or prescription medications.  Please review your pharmacy choice. Make sure the pharmacy is open so you can pick up prescription now. If there is a problem, you may contact your provider through MyChart messaging and have the prescription routed to another pharmacy.  Your safety is important to us. If you have drug allergies check your prescription carefully.   For the next 24 hours you can use MyChart to ask questions about today's visit, request a non-urgent call back, or ask for a work or school excuse. You will get an email in the next two days asking about your experience. I hope that your e-visit has been valuable and will speed your recovery.  I have spent 5 minutes in review of e-visit questionnaire, review and updating patient chart, medical decision making and response to patient.    Sufian Ravi M Advay Volante, PA-C  

## 2022-08-26 ENCOUNTER — Other Ambulatory Visit (HOSPITAL_COMMUNITY)
Admission: RE | Admit: 2022-08-26 | Discharge: 2022-08-26 | Disposition: A | Discharge status: Home / Self Care | Source: Ambulatory Visit | Attending: Obstetrics | Admitting: Obstetrics

## 2022-08-26 ENCOUNTER — Ambulatory Visit (INDEPENDENT_AMBULATORY_CARE_PROVIDER_SITE_OTHER): Admitting: Obstetrics

## 2022-08-26 ENCOUNTER — Encounter: Payer: Self-pay | Admitting: Obstetrics

## 2022-08-26 VITALS — BP 127/79 | HR 83 | Ht 67.0 in | Wt 225.0 lb

## 2022-08-26 DIAGNOSIS — Z780 Asymptomatic menopausal state: Secondary | ICD-10-CM | POA: Diagnosis not present

## 2022-08-26 DIAGNOSIS — Z01419 Encounter for gynecological examination (general) (routine) without abnormal findings: Secondary | ICD-10-CM | POA: Diagnosis present

## 2022-08-26 DIAGNOSIS — N898 Other specified noninflammatory disorders of vagina: Secondary | ICD-10-CM

## 2022-08-26 DIAGNOSIS — F17200 Nicotine dependence, unspecified, uncomplicated: Secondary | ICD-10-CM

## 2022-08-26 DIAGNOSIS — I10 Essential (primary) hypertension: Secondary | ICD-10-CM | POA: Diagnosis not present

## 2022-08-26 DIAGNOSIS — E669 Obesity, unspecified: Secondary | ICD-10-CM

## 2022-08-26 NOTE — Progress Notes (Signed)
Pt presents for AEX. Pt requesting refill of Prempro. Last PAP 04/2019. Declines STD testing. Last colonoscopy 2 years ago.

## 2022-08-26 NOTE — Progress Notes (Signed)
Subjective:        Stephanie Joseph is a 62 y.o. female here for a routine exam.  Current complaints: Vaginal discharge.    Personal health questionnaire:  Is patient Ashkenazi Jewish, have a family history of breast and/or ovarian cancer: no Is there a family history of uterine cancer diagnosed at age < 27, gastrointestinal cancer, urinary tract cancer, family member who is a Field seismologist syndrome-associated carrier: no Is the patient overweight and hypertensive, family history of diabetes, personal history of gestational diabetes, preeclampsia or PCOS: no Is patient over 67, have PCOS,  family history of premature CHD under age 56, diabetes, smoke, have hypertension or peripheral artery disease:  no At any time, has a partner hit, kicked or otherwise hurt or frightened you?: no Over the past 2 weeks, have you felt down, depressed or hopeless?: no Over the past 2 weeks, have you felt little interest or pleasure in doing things?:no   Gynecologic History Patient's last menstrual period was 02/13/2014 (within days). Contraception: post menopausal status Last Pap: 2020. Results were: normal Last mammogram: 2023. Results were: normal  Obstetric History OB History  Gravida Para Term Preterm AB Living  3 1   1 2 1  $ SAB IAB Ectopic Multiple Live Births  1 1          # Outcome Date GA Lbr Len/2nd Weight Sex Delivery Anes PTL Lv  3 Preterm           2 IAB           1 SAB             Past Medical History:  Diagnosis Date   Anemia, iron deficiency    Arthritis    Depression    Diabetes mellitus without complication (HCC)    Dysmenorrhea    Gout    Heart valve problem    Hypertension    Menorrhagia    Mitral regurgitation    Pneumonia     Past Surgical History:  Procedure Laterality Date   HAMMER TOE SURGERY     KNEE ARTHROSCOPY     Left     Current Outpatient Medications:    albuterol (VENTOLIN HFA) 108 (90 Base) MCG/ACT inhaler, Inhale 2 puffs into the lungs  every 6 (six) hours as needed for wheezing or shortness of breath., Disp: 8 g, Rfl: 0   amLODipine (NORVASC) 10 MG tablet, TAKE ONE TABLET BY MOUTH EVERY DAY, Disp: 90 tablet, Rfl: 3   atorvastatin (LIPITOR) 10 MG tablet, TAKE ONE TABLET BY MOUTH EVERY DAY, Disp: 90 tablet, Rfl: 3   azelastine (ASTELIN) 0.1 % nasal spray, Place 1 spray into both nostrils 2 (two) times daily. Use in each nostril as directed, Disp: 30 mL, Rfl: 0   benzonatate (TESSALON) 100 MG capsule, Take 1 capsule (100 mg total) by mouth 2 (two) times daily as needed for cough., Disp: 20 capsule, Rfl: 0   brimonidine-timolol (COMBIGAN) 0.2-0.5 % ophthalmic solution, Place 1 drop into both eyes every 12 (twelve) hours., Disp: , Rfl:    estrogen, conjugated,-medroxyprogesterone (PREMPRO) 0.3-1.5 MG tablet, Take 1 tablet by mouth daily., Disp: 30 tablet, Rfl: 8   fluticasone (FLONASE) 50 MCG/ACT nasal spray, Place 2 sprays into both nostrils daily., Disp: 16 g, Rfl: 6   glimepiride (AMARYL) 2 MG tablet, TAKE ONE TABLET BY MOUTH EVERY DAY WITH BREAKFAST, Disp: 90 tablet, Rfl: 1   glucose blood (ACCU-CHEK AVIVA) test strip, Use to check blood sugars 1-3 times daily.  Dx Code: E11.9, Disp: 300 each, Rfl: 1   hydrOXYzine (ATARAX) 25 MG tablet, Take 1 tablet (25 mg total) by mouth every 8 (eight) hours as needed for itching., Disp: 360 tablet, Rfl: 1   Lancets (ACCU-CHEK SOFT TOUCH) lancets, Use as instructed, Disp: 100 each, Rfl: 12   losartan-hydrochlorothiazide (HYZAAR) 100-12.5 MG tablet, Take 1 tablet by mouth daily., Disp: 90 tablet, Rfl: 3   Misc. Devices (CANE) MISC, Use as needed for left knee pain., Disp: 1 each, Rfl: 0   naproxen (NAPROSYN) 375 MG tablet, Take 1 tablet (375 mg total) by mouth 2 (two) times daily as needed for moderate pain., Disp: 180 tablet, Rfl: 0   QUEtiapine Fumarate (SEROQUEL XR) 150 MG 24 hr tablet, Take 1 tablet (150 mg total) by mouth at bedtime., Disp: 30 tablet, Rfl: 2   tirzepatide (MOUNJARO) 5 MG/0.5ML  Pen, Inject 5 mg into the skin once a week., Disp: 6 mL, Rfl: 3   TRAVEL SICKNESS 25 MG CHEW, CHEW ONE TABLET BY MOUTH THREE TIMES A DAY - AS NEEDED FOR DIZZINESS (TABLETS MAY BE CHEWED OR SWALLOWED WHOLE), Disp: 30 tablet, Rfl: 1   venlafaxine (EFFEXOR) 75 MG tablet, Take 1 tablet (75 mg total) by mouth 3 (three) times daily with meals., Disp: 270 tablet, Rfl: 1 No Known Allergies  Social History   Tobacco Use   Smoking status: Every Day    Packs/day: 0.50    Years: 7.00    Total pack years: 3.50    Types: Cigarettes   Smokeless tobacco: Never  Substance Use Topics   Alcohol use: No    Family History  Problem Relation Age of Onset   Hypertension Mother    Stroke Paternal Grandmother    Heart disease Sister    Heart disease Brother       Review of Systems  Constitutional: negative for fatigue and weight loss Respiratory: negative for cough and wheezing Cardiovascular: negative for chest pain, fatigue and palpitations Gastrointestinal: negative for abdominal pain and change in bowel habits Musculoskeletal:negative for myalgias Neurological: negative for gait problems and tremors Behavioral/Psych: negative for abusive relationship, depression Endocrine: negative for temperature intolerance    Genitourinary:negative for abnormal menstrual periods, genital lesions, hot flashes, sexual problems and vaginal discharge Integument/breast: negative for breast lump, breast tenderness, nipple discharge and skin lesion(s)    Objective:       BP 127/79   Pulse 83   Ht 5' 7"$  (1.702 m)   Wt 225 lb (102.1 kg)   LMP 02/13/2014 (Within Days)   BMI 35.24 kg/m  General:   alert  Skin:   no rash or abnormalities  Lungs:   clear to auscultation bilaterally  Heart:   regular rate and rhythm, S1, S2 normal, no murmur, click, rub or gallop  Breasts:   normal without suspicious masses, skin or nipple changes or axillary nodes  Abdomen:  normal findings: no organomegaly, soft, non-tender  and no hernia  Pelvis:  External genitalia: normal general appearance Urinary system: urethral meatus normal and bladder without fullness, nontender Vaginal: normal without tenderness, induration or masses Cervix: normal appearance Adnexa: normal bimanual exam Uterus: anteverted and non-tender, normal size   Lab Review Urine pregnancy test Labs reviewed yes Radiologic studies reviewed yes  I have spent a total of 20 minutes of face-to-face time, excluding clinical staff time, reviewing notes and preparing to see patient, ordering tests and/or medications, and counseling the patient.   Assessment:    1. Encounter for routine gynecological  examination with Papanicolaou smear of cervix Rx: - Cytology - PAP( [facility])  2. Postmenopausal - doing well  3. Vaginal discharge Rx: - Cervicovaginal ancillary only( [place])  4. HTN (hypertension), benign - managed by PCP  5. Tobacco dependence - cessation recommended  6. Obesity (BMI 35.0-39.9 without comorbidity) - weight reduction recommended     Plan:  Follow up in 3 years   Education reviewed: calcium supplements, depression evaluation, low fat, low cholesterol diet, safe sex/STD prevention, self breast exams, smoking cessation, and weight bearing exercise. Mammogram ordered. Follow up in: 3 years.     Shelly Bombard, MD 08/26/2022 11:00 AM

## 2022-08-27 LAB — CERVICOVAGINAL ANCILLARY ONLY
Bacterial Vaginitis (gardnerella): NEGATIVE
Candida Glabrata: NEGATIVE
Candida Vaginitis: NEGATIVE
Comment: NEGATIVE
Comment: NEGATIVE
Comment: NEGATIVE

## 2022-08-28 LAB — CYTOLOGY - PAP
Adequacy: ABSENT
Comment: NEGATIVE
Diagnosis: NEGATIVE
High risk HPV: NEGATIVE

## 2022-09-04 ENCOUNTER — Ambulatory Visit
Admission: RE | Admit: 2022-09-04 | Discharge: 2022-09-04 | Disposition: A | Discharge status: Home / Self Care | Source: Ambulatory Visit | Attending: Emergency Medicine | Admitting: Emergency Medicine

## 2022-09-04 ENCOUNTER — Ambulatory Visit

## 2022-09-04 DIAGNOSIS — Z1231 Encounter for screening mammogram for malignant neoplasm of breast: Secondary | ICD-10-CM

## 2022-09-14 ENCOUNTER — Encounter: Payer: Self-pay | Admitting: Emergency Medicine

## 2022-09-15 MED ORDER — LOSARTAN POTASSIUM-HCTZ 100-12.5 MG PO TABS: 1.0000 | ORAL_TABLET | Freq: Every day | ORAL | 3 refills | Status: DC

## 2022-09-15 NOTE — Progress Notes (Unsigned)
Stephanie Goltz, PhD, LAT, ATC acting as a scribe for Lynne Leader, MD.  Subjective:    CC: Bilateral knee pain  HPI: Patient is a 62 year old female presenting with bilateral knee pain x 1 month.  Patient locates pain to lateral aspect of right knee. Denies swelling. The knee will give out at times. Denies radiating pain. Denies injury to the right knee.  Pt located pain to posterior aspect of left knee. Notes swelling in the knee but denies mechanical sx. Denies radiating pain. Past injury to the left knee while doing jumping jacks several years ago. No treatment modalities for bilat knee pain.   Dx imaging: 07/13/20 L knee XR  12/21/17 R knee XR  Pertinent review of Systems: No fevers or chills  Relevant historical information: Hypertension and diabetes. Her husband drives her to appointments.  Her husband is about to have a neck surgery.   Objective:    Vitals:   09/16/22 1112  BP: 122/82  Pulse: 94  SpO2: 99%   General: Well Developed, well nourished, and in no acute distress.   MSK: Right knee decreased quadricep bulk otherwise normal-appearing Normal motion with crepitation.  Tender palpation medial and lateral joint line. Slight laxity the medial and lateral joint line. Intact strength. Negative McMurray's test.  Left knee: Mild effusion otherwise normal-appearing Normal motion with crepitation. Stable ligamentous exam. Intact strength.  Lab and Radiology Results  Procedure: Real-time Ultrasound Guided Injection of right knee superior lateral patellar space Device: Philips Affiniti 50G Images permanently stored and available for review in PACS Ultrasound evaluation prior to injection reveals mild effusion.  Degenerative appearing medial and lateral joint lines are present. Verbal informed consent obtained.  Discussed risks and benefits of procedure. Warned about infection, bleeding, hyperglycemia damage to structures among others. Patient expresses  understanding and agreement Time-out conducted.   Noted no overlying erythema, induration, or other signs of local infection.   Skin prepped in a sterile fashion.   Local anesthesia: Topical Ethyl chloride.   With sterile technique and under real time ultrasound guidance: 40 mg of Kenalog and 2 ml Marcaine injected into knee joint. Fluid seen entering the joint capsule.   Completed without difficulty   Pain immediately resolved suggesting accurate placement of the medication.   Advised to call if fevers/chills, erythema, induration, drainage, or persistent bleeding.   Images permanently stored and available for review in the ultrasound unit.  Impression: Technically successful ultrasound guided injection.    Procedure: Real-time Ultrasound Guided Injection of left knee superior lateral patellar space Device: Philips Affiniti 50G Images permanently stored and available for review in PACS Ultrasound evaluation prior to injection reveals degenerative appearing medial joint line.  Moderate joint effusion is present. Verbal informed consent obtained.  Discussed risks and benefits of procedure. Warned about infection, bleeding, hyperglycemia damage to structures among others. Patient expresses understanding and agreement Time-out conducted.   Noted no overlying erythema, induration, or other signs of local infection.   Skin prepped in a sterile fashion.   Local anesthesia: Topical Ethyl chloride.   With sterile technique and under real time ultrasound guidance: 40 mg of Kenalog and 2 mL of Marcaine injected into knee joint. Fluid seen entering the joint capsule.   Completed without difficulty   Pain immediately resolved suggesting accurate placement of the medication.   Advised to call if fevers/chills, erythema, induration, drainage, or persistent bleeding.   Images permanently stored and available for review in the ultrasound unit.  Impression: Technically successful ultrasound guided  injection.    X-ray images bilateral knees obtained today personally and independently interpreted  Right knee: Mild medial and lateral DJD.  Mild patellofemoral DJD.  No acute fractures.  Left knee: Mild medial compartment and patellofemoral DJD.  No acute fractures.  Await formal radiology review   Impression and Recommendations:    Assessment and Plan: 62 y.o. female with bilateral knee pain thought to be exacerbation of underlying DJD.  There may be a component of degenerative meniscus tear.  Plan for steroid injection today.  Additionally refer to physical therapy.  Check back in 8 weeks..  Recommend also Voltaren gel.  PDMP not reviewed this encounter. Orders Placed This Encounter  Procedures   [place] LIMITED JOINT SPACE STRUCTURES LOW BILAT(NO LINKED CHARGES)    Order Specific Question:   Reason for Exam (SYMPTOM  OR DIAGNOSIS REQUIRED)    Answer:   bilat knee    Order Specific Question:   Preferred imaging location?    Answer:   [facility]   DG Knee AP/LAT W/[place] Right    Standing Status:   Future    Number of Occurrences:   1    Standing Expiration Date:   09/16/2023    Order Specific Question:   Reason for Exam (SYMPTOM  OR DIAGNOSIS REQUIRED)    Answer:   eval knee pain    Order Specific Question:   Preferred imaging location?    Answer:   [facility]   DG Knee AP/LAT W/Sun[place] Left    Standing Status:   Future    Number of Occurrences:   1    Standing Expiration Date:   09/16/2023    Order Specific Question:   Reason for Exam (SYMPTOM  OR DIAGNOSIS REQUIRED)    Answer:   eval knee pain    Order Specific Question:   Preferred imaging location?    Answer:   [facility]   Ambulatory referral to Physical Therapy    Referral Priority:   Routine    Referral Type:   Physical Medicine    Referral Reason:   Specialty Services Required    Requested Specialty:   Physical Therapy   No orders of the defined types were placed in  this encounter.   Discussed warning signs or symptoms. Please see discharge instructions. Patient expresses understanding.   The above documentation has been reviewed and is accurate and complete Lynne Leader, M.D.

## 2022-09-16 ENCOUNTER — Ambulatory Visit: Payer: Self-pay

## 2022-09-16 ENCOUNTER — Encounter: Payer: Self-pay | Admitting: Family Medicine

## 2022-09-16 ENCOUNTER — Ambulatory Visit (INDEPENDENT_AMBULATORY_CARE_PROVIDER_SITE_OTHER)

## 2022-09-16 ENCOUNTER — Ambulatory Visit (INDEPENDENT_AMBULATORY_CARE_PROVIDER_SITE_OTHER): Admitting: Family Medicine

## 2022-09-16 VITALS — BP 122/82 | HR 94 | Ht 67.0 in | Wt 225.0 lb

## 2022-09-16 DIAGNOSIS — M170 Bilateral primary osteoarthritis of knee: Secondary | ICD-10-CM | POA: Diagnosis not present

## 2022-09-16 DIAGNOSIS — M25561 Pain in right knee: Secondary | ICD-10-CM | POA: Diagnosis not present

## 2022-09-16 DIAGNOSIS — M25562 Pain in left knee: Secondary | ICD-10-CM

## 2022-09-16 DIAGNOSIS — G8929 Other chronic pain: Secondary | ICD-10-CM

## 2022-09-16 NOTE — Patient Instructions (Addendum)
Thank you for coming in today.   Please get an Xray today before you leave   I've referred you to Physical Therapy.  Let us know if you don't hear from them in one week.   Please use Voltaren gel (Generic Diclofenac Gel) up to 4x daily for pain as needed.  This is available over-the-counter as both the name brand Voltaren gel and the generic diclofenac gel.   We will see you back in 8 weeks.

## 2022-09-17 NOTE — Progress Notes (Signed)
Left knee x-ray shows some mild arthritis changes.

## 2022-09-17 NOTE — Progress Notes (Signed)
Right knee x-ray shows arthritis changes and perhaps some cartilage missing from underneath the kneecap based on the way the x-ray looks.  We can tell more accurately with an MRI if needed.  If not better with physical therapy would recommend MRI of the knee.

## 2022-09-18 ENCOUNTER — Encounter: Payer: Self-pay | Admitting: Emergency Medicine

## 2022-09-18 ENCOUNTER — Ambulatory Visit (INDEPENDENT_AMBULATORY_CARE_PROVIDER_SITE_OTHER): Admitting: Emergency Medicine

## 2022-09-18 ENCOUNTER — Ambulatory Visit: Admitting: Emergency Medicine

## 2022-09-18 VITALS — BP 126/84 | HR 87 | Temp 98.3°F | Ht 67.0 in | Wt 222.0 lb

## 2022-09-18 DIAGNOSIS — H9313 Tinnitus, bilateral: Secondary | ICD-10-CM | POA: Diagnosis not present

## 2022-09-18 DIAGNOSIS — H9393 Unspecified disorder of ear, bilateral: Secondary | ICD-10-CM | POA: Diagnosis not present

## 2022-09-18 NOTE — Progress Notes (Signed)
Stephanie Joseph 62 y.o.   Chief Complaint  Patient presents with   issues with her ear    Patient states both ears have a " swooshing " sound, all the time     HISTORY OF PRESENT ILLNESS: Acute problem visit today. This is a 62 y.o. female complaining of "swooshing" sound in both ears on and off for the past several weeks No other associated symptoms.  No new medications. No other complaints or medical concerns today.  HPI   Prior to Admission medications   Medication Sig Start Date End Date Taking? Authorizing Provider  albuterol (VENTOLIN HFA) 108 (90 Base) MCG/ACT inhaler Inhale 2 puffs into the lungs every 6 (six) hours as needed for wheezing or shortness of breath. 08/03/22  Yes Crain, Whitney L, PA  amLODipine (NORVASC) 10 MG tablet TAKE ONE TABLET BY MOUTH EVERY DAY 06/23/22  Yes Melvin Whiteford, Ines Bloomer, MD  atorvastatin (LIPITOR) 10 MG tablet TAKE ONE TABLET BY MOUTH EVERY DAY 06/01/22  Yes Derian Dimalanta, Ines Bloomer, MD  azelastine (ASTELIN) 0.1 % nasal spray Place 1 spray into both nostrils 2 (two) times daily. Use in each nostril as directed 08/03/22  Yes Burnette, Clearnce Sorrel, PA-C  benzonatate (TESSALON) 100 MG capsule Take 1 capsule (100 mg total) by mouth 2 (two) times daily as needed for cough. 08/03/22  Yes Crain, Whitney L, PA  brimonidine-timolol (COMBIGAN) 0.2-0.5 % ophthalmic solution Place 1 drop into both eyes every 12 (twelve) hours.   Yes [provider]  estrogen, conjugated,-medroxyprogesterone (PREMPRO) 0.3-1.5 MG tablet Take 1 tablet by mouth daily. 09/26/21  Yes Griffin Basil, MD  fluticasone (FLONASE) 50 MCG/ACT nasal spray Place 2 sprays into both nostrils daily. 05/13/21  Yes Michela Pitcher, NP  glimepiride (AMARYL) 2 MG tablet TAKE ONE TABLET BY MOUTH EVERY DAY WITH BREAKFAST 06/23/22  Yes Runa Whittingham, Ines Bloomer, MD  glucose blood (ACCU-CHEK AVIVA) test strip Use to check blood sugars 1-3 times daily. Dx Code: E11.9 01/20/18  Yes Marrian Salvage, FNP  hydrOXYzine (ATARAX) 25 MG tablet Take 1 tablet (25 mg total) by mouth every 8 (eight) hours as needed for itching. 04/01/22  Yes Ephrem Carrick, Ines Bloomer, MD  Lancets (ACCU-CHEK SOFT TOUCH) lancets Use as instructed 05/21/17  Yes Dragnev, Caesar Chestnut, NP  losartan-hydrochlorothiazide (HYZAAR) 100-12.5 MG tablet Take 1 tablet by mouth daily. 09/15/22  Yes Joycie Aerts, Ines Bloomer, MD  Misc. Devices (CANE) MISC Use as needed for left knee pain. 01/29/18  Yes Dragnev, Caesar Chestnut, NP  naproxen (NAPROSYN) 375 MG tablet Take 1 tablet (375 mg total) by mouth 2 (two) times daily as needed for moderate pain. 12/09/21  Yes Lorenda Peck, DPM  QUEtiapine Fumarate (SEROQUEL XR) 150 MG 24 hr tablet Take 1 tablet (150 mg total) by mouth at bedtime. 10/10/21  Yes Penn, Cicely, NP  tirzepatide [facility]) 5 MG/0.5ML Pen Inject 5 mg into the skin once a week. 12/30/21  Yes Asalee Barrette, Ines Bloomer, MD  TRAVEL SICKNESS 25 MG CHEW CHEW ONE TABLET BY MOUTH THREE TIMES A DAY - AS NEEDED FOR DIZZINESS (TABLETS MAY BE CHEWED OR SWALLOWED WHOLE) 06/23/22  Yes Horald Pollen, MD  venlafaxine (EFFEXOR) 75 MG tablet Take 1 tablet (75 mg total) by mouth 3 (three) times daily with meals. 09/26/21  Yes Penn, Lunette Stands, NP    No Known Allergies  Patient Active Problem List   Diagnosis Date Noted   Chronic left hip pain 04/01/2022   Hot flashes due to menopause 09/24/2021  Dizziness 07/31/2021   Chronic midline low back pain 07/12/2018   Glaucoma 05/21/2017   Class 1 obesity due to excess calories with serious comorbidity and body mass index (BMI) of 33.0 to 33.9 in adult 09/19/2016   Vitamin D deficiency 09/19/2016   Dyslipidemia associated with type 2 diabetes mellitus ([place]) 07/10/2015   Hormone replacement therapy (postmenopausal) 10/04/2014   Mitral regurgitation 11/05/2012   Valvular heart disease 10/21/2011   Menorrhagia, premenopausal 09/01/2011   Anxiety and depression 03/04/2010   GOUT  12/16/2006   ANEMIA, IRON DEFICIENCY NOS 12/16/2006   Current smoker 12/16/2006   Hypertension associated with diabetes ([place]) 12/16/2006    Past Medical History:  Diagnosis Date   Anemia, iron deficiency    Arthritis    Depression    Diabetes mellitus without complication (HCC)    Dysmenorrhea    Gout    Heart valve problem    Hypertension    Menorrhagia    Mitral regurgitation    Pneumonia     Past Surgical History:  Procedure Laterality Date   HAMMER TOE SURGERY     KNEE ARTHROSCOPY     Left    Social History   Socioeconomic History   Marital status: Married    Spouse name: Not on file   Number of children: 1   Years of education: 12   Highest education level: Not on file  Occupational History   Occupation: Disability Pending  Tobacco Use   Smoking status: Every Day    Packs/day: 0.50    Years: 7.00    Additional pack years: 0.00    Total pack years: 3.50    Types: Cigarettes   Smokeless tobacco: Never  Vaping Use   Vaping Use: Never used  Substance and Sexual Activity   Alcohol use: No   Drug use: Not Currently    Types: "Crack" cocaine   Sexual activity: Not Currently    Birth control/protection: None, Post-menopausal  Other Topics Concern   Not on file  Social History Narrative   Fun: Go to church   Denies abuse and feels safe at home.    Social Determinants of Health   Financial Resource Strain: Not on file  Food Insecurity: Not on file  Transportation Needs: Not on file  Physical Activity: Not on file  Stress: Not on file  Social Connections: Not on file  Intimate Partner Violence: Not on file    Family History  Problem Relation Age of Onset   Hypertension Mother    Stroke Paternal Grandmother    Heart disease Sister    Heart disease Brother      Review of Systems  Constitutional: Negative.  Negative for chills and fever.  HENT:  Positive for hearing loss and tinnitus. Negative for congestion, ear pain and sore throat.    Respiratory: Negative.  Negative for cough and shortness of breath.   Cardiovascular: Negative.  Negative for chest pain and palpitations.  Gastrointestinal:  Negative for abdominal pain, diarrhea, nausea and vomiting.  Genitourinary: Negative.  Negative for dysuria and hematuria.  Skin: Negative.  Negative for rash.  Neurological: Negative.  Negative for dizziness and headaches.  All other systems reviewed and are negative.   Vitals:   09/18/22 1045  BP: 126/84  Pulse: 87  Temp: 98.3 F (36.8 C)  SpO2: 95%    Physical Exam Vitals reviewed.  Constitutional:      Appearance: Normal appearance.  HENT:     Head: Normocephalic.     Right  Ear: Tympanic membrane, ear canal and external ear normal.     Left Ear: Tympanic membrane, ear canal and external ear normal.     Nose: No congestion.     Mouth/Throat:     Mouth: Mucous membranes are moist.     Pharynx: Oropharynx is clear.  Eyes:     Extraocular Movements: Extraocular movements intact.     Conjunctiva/sclera: Conjunctivae normal.     Pupils: Pupils are equal, round, and reactive to light.  Neck:     Vascular: No carotid bruit.  Cardiovascular:     Rate and Rhythm: Normal rate and regular rhythm.     Pulses: Normal pulses.     Heart sounds: Normal heart sounds.  Pulmonary:     Effort: Pulmonary effort is normal.     Breath sounds: Normal breath sounds.  Musculoskeletal:     Cervical back: No tenderness.  Lymphadenopathy:     Cervical: No cervical adenopathy.  Skin:    General: Skin is warm and dry.  Neurological:     General: No focal deficit present.     Mental Status: She is alert and oriented to person, place, and time.     Cranial Nerves: No cranial nerve deficit.  Psychiatric:        Mood and Affect: Mood normal.        Behavior: Behavior normal.      ASSESSMENT & PLAN: A total of 33 minutes was spent with the patient and counseling/coordination of care regarding preparing for this visit, review of  most recent office visit notes, review of chronic medical conditions under management, review of all medications, differential diagnosis of bilateral tinnitus and need for ENT evaluation, prognosis, documentation and need for follow-up.  Problem List Items Addressed This Visit       Other   Bilateral tinnitus - Primary    Active and affecting quality of life No abnormal findings detected today Recommend ENT evaluation Referral placed today.      Problem of both ears    With bilateral abnormal sound perception Recommend ENT evaluation Referral placed today.      Relevant Orders   Ambulatory referral to ENT   Patient Instructions  Health Maintenance, Female Adopting a healthy lifestyle and getting preventive care are important in promoting health and wellness. Ask your health care provider about: The right schedule for you to have regular tests and exams. Things you can do on your own to prevent diseases and keep yourself healthy. What should I know about diet, weight, and exercise? Eat a healthy diet  Eat a diet that includes plenty of vegetables, fruits, low-fat dairy products, and lean protein. Do not eat a lot of foods that are high in solid fats, added sugars, or sodium. Maintain a healthy weight Body mass index (BMI) is used to identify weight problems. It estimates body fat based on height and weight. Your health care provider can help determine your BMI and help you achieve or maintain a healthy weight. Get regular exercise Get regular exercise. This is one of the most important things you can do for your health. Most adults should: Exercise for at least 150 minutes each week. The exercise should increase your heart rate and make you sweat (moderate-intensity exercise). Do strengthening exercises at least twice a week. This is in addition to the moderate-intensity exercise. Spend less time sitting. Even light physical activity can be beneficial. Watch cholesterol and  blood lipids Have your blood tested for lipids and cholesterol at  62 years of age, then have this test every 5 years. Have your cholesterol levels checked more often if: Your lipid or cholesterol levels are high. You are older than 62 years of age. You are at high risk for heart disease. What should I know about cancer screening? Depending on your health history and family history, you may need to have cancer screening at various ages. This may include screening for: Breast cancer. Cervical cancer. Colorectal cancer. Skin cancer. Lung cancer. What should I know about heart disease, diabetes, and high blood pressure? Blood pressure and heart disease High blood pressure causes heart disease and increases the risk of stroke. This is more likely to develop in people who have high blood pressure readings or are overweight. Have your blood pressure checked: Every 3-5 years if you are 36-69 years of age. Every year if you are 25 years old or older. Diabetes Have regular diabetes screenings. This checks your fasting blood sugar level. Have the screening done: Once every three years after age 42 if you are at a normal weight and have a low risk for diabetes. More often and at a younger age if you are overweight or have a high risk for diabetes. What should I know about preventing infection? Hepatitis B If you have a higher risk for hepatitis B, you should be screened for this virus. Talk with your health care provider to find out if you are at risk for hepatitis B infection. Hepatitis C Testing is recommended for: Everyone born from [birth year] through 1965. Anyone with known risk factors for hepatitis C. Sexually transmitted infections (STIs) Get screened for STIs, including gonorrhea and chlamydia, if: You are sexually active and are younger than 62 years of age. You are older than 62 years of age and your health care provider tells you that you are at risk for this type of infection. Your sexual  activity has changed since you were last screened, and you are at increased risk for chlamydia or gonorrhea. Ask your health care provider if you are at risk. Ask your health care provider about whether you are at high risk for HIV. Your health care provider may recommend a prescription medicine to help prevent HIV infection. If you choose to take medicine to prevent HIV, you should first get tested for HIV. You should then be tested every 3 months for as long as you are taking the medicine. Pregnancy If you are about to stop having your period (premenopausal) and you may become pregnant, seek counseling before you get pregnant. Take 400 to 800 micrograms (mcg) of folic acid every day if you become pregnant. Ask for birth control (contraception) if you want to prevent pregnancy. Osteoporosis and menopause Osteoporosis is a disease in which the bones lose minerals and strength with aging. This can result in bone fractures. If you are 81 years old or older, or if you are at risk for osteoporosis and fractures, ask your health care provider if you should: Be screened for bone loss. Take a calcium or vitamin D supplement to lower your risk of fractures. Be given hormone replacement therapy (HRT) to treat symptoms of menopause. Follow these instructions at home: Alcohol use Do not drink alcohol if: Your health care provider tells you not to drink. You are pregnant, may be pregnant, or are planning to become pregnant. If you drink alcohol: Limit how much you have to: 0-1 drink a day. Know how much alcohol is in your drink. In the [place] one drink equals  one 12 oz bottle of beer (355 mL), one 5 oz glass of wine (148 mL), or one 1 oz glass of hard liquor (44 mL). Lifestyle Do not use any products that contain nicotine or tobacco. These products include cigarettes, chewing tobacco, and vaping devices, such as e-cigarettes. If you need help quitting, ask your health care provider. Do not use street  drugs. Do not share needles. Ask your health care provider for help if you need support or information about quitting drugs. General instructions Schedule regular health, dental, and eye exams. Stay current with your vaccines. Tell your health care provider if: You often feel depressed. You have ever been abused or do not feel safe at home. Summary Adopting a healthy lifestyle and getting preventive care are important in promoting health and wellness. Follow your health care provider's instructions about healthy diet, exercising, and getting tested or screened for diseases. Follow your health care provider's instructions on monitoring your cholesterol and blood pressure. This information is not intended to replace advice given to you by your health care provider. Make sure you discuss any questions you have with your health care provider. Document Revised: 11/12/2020 Document Reviewed: 11/12/2020 Elsevier Patient Education  [place], MD [facility]

## 2022-09-18 NOTE — Assessment & Plan Note (Signed)
Active and affecting quality of life No abnormal findings detected today Recommend ENT evaluation Referral placed today.

## 2022-09-18 NOTE — Patient Instructions (Signed)

## 2022-09-18 NOTE — Assessment & Plan Note (Signed)
With bilateral abnormal sound perception Recommend ENT evaluation Referral placed today.

## 2022-10-07 ENCOUNTER — Ambulatory Visit: Admitting: Physical Therapy

## 2022-10-27 ENCOUNTER — Encounter: Payer: Self-pay | Admitting: Emergency Medicine

## 2022-10-27 ENCOUNTER — Other Ambulatory Visit: Payer: Self-pay | Admitting: Podiatry

## 2022-10-27 ENCOUNTER — Other Ambulatory Visit: Payer: Self-pay | Admitting: Emergency Medicine

## 2022-10-27 ENCOUNTER — Other Ambulatory Visit: Payer: Self-pay

## 2022-10-27 DIAGNOSIS — N951 Menopausal and female climacteric states: Secondary | ICD-10-CM

## 2022-10-27 DIAGNOSIS — G8929 Other chronic pain: Secondary | ICD-10-CM

## 2022-10-27 MED ORDER — PREMPRO 0.3-1.5 MG PO TABS: 1.0000 | ORAL_TABLET | Freq: Every day | ORAL | 8 refills | Status: DC

## 2022-10-27 MED ORDER — BUSPIRONE HCL 7.5 MG PO TABS: 7.5000 mg | ORAL_TABLET | Freq: Two times a day (BID) | ORAL | 0 refills | Status: AC

## 2022-10-27 NOTE — Telephone Encounter (Signed)
These medications should be refilled by her psychiatrist/psychologist.  Not sure who prescribed them in the first place.  Thanks.

## 2022-10-27 NOTE — Telephone Encounter (Signed)
They were prescribed by her psychiatrist according to the notes from behavioral health. I still sent a new prescription for BuSpar 7.5 mg today to [facility] pharmacy.  Thanks.

## 2022-10-28 MED ORDER — NAPROXEN 375 MG PO TABS: 375.0000 mg | ORAL_TABLET | Freq: Two times a day (BID) | ORAL | 0 refills | Status: DC | PRN

## 2022-10-29 ENCOUNTER — Telehealth: Payer: Self-pay

## 2022-10-29 DIAGNOSIS — N951 Menopausal and female climacteric states: Secondary | ICD-10-CM

## 2022-10-29 MED ORDER — PREMPRO 0.3-1.5 MG PO TABS: 1.0000 | ORAL_TABLET | Freq: Every day | ORAL | 8 refills | Status: AC

## 2022-10-29 NOTE — Telephone Encounter (Signed)
S/w pt and she stated that rx was sent to wrong pharmacy, advised will resend

## 2022-11-10 NOTE — Progress Notes (Unsigned)
Office Visit    Patient Name: Stephanie Joseph Date of Encounter: 11/12/2022  Primary Care Provider:  Georgina Quint, MD Primary Cardiologist:  None Primary Electrophysiologist: None   Past Medical History    Past Medical History:  Diagnosis Date   Anemia, iron deficiency    Arthritis    Depression    Diabetes mellitus without complication (HCC)    Dysmenorrhea    Gout    Heart valve problem    Hypertension    Menorrhagia    Mitral regurgitation    Pneumonia    Past Surgical History:  Procedure Laterality Date   HAMMER TOE SURGERY     KNEE ARTHROSCOPY     Left    Allergies  No Known Allergies   History of Present Illness    Stephanie Joseph  is a 62year old female with a PMH of 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  Ms. Biddick has been followed by Dr. Eden Emms since 2015 when she was referred for murmur and abnormal echo.  2D echo indicated severe MR with no prolapse or flail.  She has a history of drug use which is currently in remission.  She is seen by psychiatry for management of anxiety and bipolar disease.  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  She continued ACE inhibitor and Hyzaar with no symptoms noted from most recent visit.  Since last being seen in the office patient reports she has been doing well from a cardiac perspective since her previous visit.  She is however under tremendous amount of stress with her husband.  He recently had back surgery and has been very demanding and causing her extra grief.  Her blood pressure today is well-controlled at 110/70 and patient has not taken medications prior to visit.  Her heart rate is 89 bpm. She reports no adverse reactions with her medications and has also been compliant with her current therapy.  She is lost 5  pounds unintentionally due to increased stress and change in eating habits.  I advised her to follow-up with her PCP regarding titration of her current antianxiety medication regimen.  Patient denies chest pain, palpitations, dyspnea, PND, orthopnea, nausea, vomiting, dizziness, syncope, edema, weight gain, or early satiety.  Home Medications    Current Outpatient Medications  Medication Sig Dispense Refill   albuterol (VENTOLIN HFA) 108 (90 Base) MCG/ACT inhaler Inhale 2 puffs into the lungs every 6 (six) hours as needed for wheezing or shortness of breath. 8 g 0   amLODipine (NORVASC) 10 MG tablet TAKE ONE TABLET BY MOUTH EVERY DAY 90 tablet 3   atorvastatin (LIPITOR) 10 MG tablet TAKE ONE TABLET BY MOUTH EVERY DAY 90 tablet 3   azelastine (ASTELIN) 0.1 % nasal spray Place 1 spray into both nostrils 2 (two) times daily. Use in each nostril as directed 30 mL 0   benzonatate (TESSALON) 100 MG capsule Take 1 capsule (100 mg total) by mouth 2 (two) times daily as needed for cough. 20 capsule 0   brimonidine-timolol (COMBIGAN) 0.2-0.5 % ophthalmic solution Place 1 drop into both eyes every 12 (twelve) hours.     busPIRone (BUSPAR) 7.5 MG tablet Take 1 tablet (7.5 mg total) by mouth 2 (two) times daily. 60 tablet 0   estrogen, conjugated,-medroxyprogesterone (PREMPRO) 0.3-1.5 MG tablet Take  1 tablet by mouth daily. 30 tablet 8   fluticasone (FLONASE) 50 MCG/ACT nasal spray Place 2 sprays into both nostrils daily. 16 g 6   glimepiride (AMARYL) 2 MG tablet TAKE ONE TABLET BY MOUTH EVERY DAY WITH BREAKFAST 90 tablet 1   glucose blood (ACCU-CHEK AVIVA) test strip Use to check blood sugars 1-3 times daily. Dx Code: E11.9 300 each 1   hydrOXYzine (ATARAX) 25 MG tablet Take 1 tablet (25 mg total) by mouth every 8 (eight) hours as needed for itching. 360 tablet 1   Lancets (ACCU-CHEK SOFT TOUCH) lancets Use as instructed 100 each 12   losartan-hydrochlorothiazide (HYZAAR) 100-12.5 MG tablet Take 1 tablet by  mouth daily. 90 tablet 3   Misc. Devices (CANE) MISC Use as needed for left knee pain. 1 each 0   naproxen (NAPROSYN) 375 MG tablet Take 1 tablet (375 mg total) by mouth 2 (two) times daily as needed for moderate pain. 180 tablet 0   QUEtiapine Fumarate (SEROQUEL XR) 150 MG 24 hr tablet Take 1 tablet (150 mg total) by mouth at bedtime. 30 tablet 2   tirzepatide (MOUNJARO) 5 MG/0.5ML Pen Inject 5 mg into the skin once a week. 6 mL 3   TRAVEL SICKNESS 25 MG CHEW CHEW ONE TABLET BY MOUTH THREE TIMES A DAY - AS NEEDED FOR DIZZINESS (TABLETS MAY BE CHEWED OR SWALLOWED WHOLE) 30 tablet 1   venlafaxine (EFFEXOR) 75 MG tablet Take 1 tablet (75 mg total) by mouth 3 (three) times daily with meals. 270 tablet 1   No current facility-administered medications for this visit.     Review of Systems  Please see the history of present illness.    (+) Anxiety (+) Depression  All other systems reviewed and are otherwise negative except as noted above.  Physical Exam    Wt Readings from Last 3 Encounters:  11/12/22 213 lb 3.2 oz (96.7 kg)  11/11/22 214 lb (97.1 kg)  09/18/22 222 lb (100.7 kg)   VS: Vitals:   11/12/22 1056  BP: 110/70  SpO2: 99%  ,Body mass index is 34.41 kg/m.  Constitutional:      Appearance: Healthy appearance. Not in distress.  Neck:     Vascular: JVD normal.  Pulmonary:     Effort: Pulmonary effort is normal.     Breath sounds: No wheezing. No rales. Diminished in the bases Cardiovascular:     Normal rate. Regular rhythm. Normal S1. Normal S2.      Murmurs: 2/6 systolic murmur  Edema:    Peripheral edema absent.  Abdominal:     Palpations: Abdomen is soft non tender. There is no hepatomegaly.  Skin:    General: Skin is warm and dry.  Neurological:     General: No focal deficit present.     Mental Status: Alert and oriented to person, place and time.     Cranial Nerves: Cranial nerves are intact.  EKG/LABS/ Recent Cardiac Studies    ECG personally reviewed by me  today - sinus rhythm with possible right atrial enlargement and rate of 89 bpm with no acute changes consistent with previous EKG.  Cardiac Studies & Procedures       ECHOCARDIOGRAM  ECHOCARDIOGRAM COMPLETE 11/25/2021  Narrative ECHOCARDIOGRAM REPORT    Patient Name:   Stephanie Joseph [facility] Date of Exam: 11/25/2021 Medical Rec #:  [id number]                 Height:       67.0 in  Accession #:    [id number]                Weight:       217.6 lb Date of Birth:  1960-10-21                 BSA:          2.096 m Patient Age:    61 years                  BP:           136/78 mmHg Patient Gender: F                         HR:           70 bpm. Exam Location:  [address]  Procedure: 2D Echo, Cardiac Doppler and Color Doppler  Indications:    I35.1 AI  History:        Patient has prior history of Echocardiogram examinations, most recent 12/17/2017. AI, Signs/Symptoms:MR; Risk Factors:Hypertension, Diabetes, Dyslipidemia and Current Smoker.  Sonographer:    Samule Ohm RDCS Referring Phys: [id number] [address] Stade  IMPRESSIONS   1. Left ventricular ejection fraction, by estimation, is 50 to 55%. The left ventricle has low normal function. The left ventricle has no regional wall motion abnormalities. There is mild concentric left ventricular hypertrophy. Left ventricular diastolic parameters are indeterminate. 2. Right ventricular systolic function is normal. The right ventricular size is normal. There is normal pulmonary artery systolic pressure. 3. Left atrial size was severely dilated. 4. The mitral valve is degenerative. Trivial mitral valve regurgitation. No evidence of mitral stenosis. 5. The aortic valve is tricuspid. There is mild calcification of the aortic valve. There is mild thickening of the aortic valve. Aortic valve regurgitation is moderate. No aortic stenosis is present. Aortic regurgitation PHT measures 402 msec. 6. Aortic dilatation noted. There is mild dilatation  of the ascending aorta, measuring 39 mm. 7. The inferior vena cava is normal in size with <50% respiratory variability, suggesting right atrial pressure of 8 mmHg.  FINDINGS Left Ventricle: Left ventricular ejection fraction, by estimation, is 50 to 55%. The left ventricle has low normal function. The left ventricle has no regional wall motion abnormalities. The left ventricular internal cavity size was normal in size. There is mild concentric left ventricular hypertrophy. Left ventricular diastolic parameters are indeterminate.  Right Ventricle: The right ventricular size is normal. No increase in right ventricular wall thickness. Right ventricular systolic function is normal. There is normal pulmonary artery systolic pressure. The tricuspid regurgitant velocity is 2.39 m/s, and with an assumed right atrial pressure of 8 mmHg, the estimated right ventricular systolic pressure is 30.8 mmHg.  Left Atrium: Left atrial size was severely dilated.  Right Atrium: Right atrial size was normal in size.  Pericardium: There is no evidence of pericardial effusion.  Mitral Valve: The mitral valve is degenerative in appearance. There is mild thickening of the mitral valve leaflet(s). There is mild calcification of the mitral valve leaflet(s). Trivial mitral valve regurgitation. No evidence of mitral valve stenosis.  Tricuspid Valve: The tricuspid valve is normal in structure. Tricuspid valve regurgitation is trivial. No evidence of tricuspid stenosis.  Aortic Valve: The aortic valve is tricuspid. There is mild calcification of the aortic valve. There is mild thickening of the aortic valve. Aortic valve regurgitation is moderate. Aortic regurgitation PHT measures 402 msec. No aortic stenosis is present.  Pulmonic Valve:  The pulmonic valve was normal in structure. Pulmonic valve regurgitation is trivial. No evidence of pulmonic stenosis.  Aorta: Aortic dilatation noted. There is mild dilatation of the  ascending aorta, measuring 39 mm.  Venous: The inferior vena cava is normal in size with less than 50% respiratory variability, suggesting right atrial pressure of 8 mmHg.  IAS/Shunts: No atrial level shunt detected by color flow Doppler.   LEFT VENTRICLE PLAX 2D LVIDd:         5.00 cm   Diastology LVIDs:         3.60 cm   LV e' medial:    10.70 cm/s LV PW:         1.20 cm   LV E/e' medial:  12.1 LV IVS:        1.20 cm   LV e' lateral:   9.57 cm/s LVOT diam:     2.00 cm   LV E/e' lateral: 13.6 LV SV:         82 LV SV Index:   39 LVOT Area:     3.14 cm   RIGHT VENTRICLE             IVC RV S prime:     12.10 cm/s  IVC diam: 1.20 cm TAPSE (M-mode): 2.1 cm RVSP:           25.8 mmHg  LEFT ATRIUM             Index        RIGHT ATRIUM           Index LA diam:        3.80 cm 1.81 cm/m   RA Pressure: 3.00 mmHg LA Vol (A2C):   72.9 ml 34.78 ml/m  RA Area:     15.50 cm LA Vol (A4C):   91.7 ml 43.75 ml/m  RA Volume:   43.00 ml  20.51 ml/m LA Biplane Vol: 88.1 ml 42.03 ml/m AORTIC VALVE LVOT Vmax:   118.00 cm/s LVOT Vmean:  78.100 cm/s LVOT VTI:    0.261 m AI PHT:      402 msec  AORTA Ao Root diam: 3.30 cm Ao Asc diam:  3.90 cm  MITRAL VALVE                TRICUSPID VALVE MV Area (PHT): 3.87 cm     TR Peak grad:   22.8 mmHg MV Decel Time: 196 msec     TR Vmax:        239.00 cm/s MV E velocity: 130.00 cm/s  Estimated RAP:  3.00 mmHg MV A velocity: 112.00 cm/s  RVSP:           25.8 mmHg MV E/A ratio:  1.16 SHUNTS Systemic VTI:  0.26 m Systemic Diam: 2.00 cm  Chilton Si MD Electronically signed by Chilton Si MD Signature Date/Time: 11/25/2021/3:57:34 PM    Final              Lab Results  Component Value Date   CREATININE 0.88 07/10/2022   BUN 15 07/10/2022   NA 138 07/10/2022   K 3.8 07/10/2022   CL 102 07/10/2022   CO2 30 07/10/2022   Lab Results  Component Value Date   ALT 11 07/10/2022   AST 15 07/10/2022   ALKPHOS 74 07/10/2022    BILITOT 0.3 07/10/2022   Lab Results  Component Value Date   CHOL 105 07/10/2022   HDL 53.30 07/10/2022   LDLCALC 34 07/10/2022   TRIG 88.0  07/10/2022   CHOLHDL 2 07/10/2022    Lab Results  Component Value Date   HGBA1C 6.5 (A) 07/10/2022     Assessment & Plan    1.  History of nonrheumatic MR and AR: -Previous 2D echo completed 11/2021 with EF of 50-55% and mild concentric LVH with trivial MVR noted and moderate AR -SBE prophylaxis is not indicated or required for dental procedures -Today patient reports no episodes of dizziness, chest pain, or shortness of breath. -She is euvolemic on examination today. -We will order follow-up 2D echo for surveillance of mitral and aortic regurg.  2.  Essential hypertension: -Patient's blood pressure today was well-controlled at 110/70 -Continue Norvasc 10 mg, Hyzaar 100-12.5 mg daily  3.  Hyperlipidemia: -Patient's last LDL cholesterol was 34 -Continue Lipitor 10 mg daily  4.  DM type II: -Patient's most recent hemoglobin A1c was well-controlled at 6.5 -Continue Mounjar per PCP  5.  Anxiety and depression: -Patient reports increased role strain with her husband who recently completed back surgery. -Continue current medications per PCP  Disposition: Follow-up with None or APP in 12 months    Medication Adjustments/Labs and Tests Ordered: Current medicines are reviewed at length with the patient today.  Concerns regarding medicines are outlined above.   Signed, Napoleon Form, Leodis Rains, NP 11/12/2022, 11:24 AM [facility]

## 2022-11-11 ENCOUNTER — Telehealth: Payer: Self-pay

## 2022-11-11 ENCOUNTER — Ambulatory Visit (INDEPENDENT_AMBULATORY_CARE_PROVIDER_SITE_OTHER): Admitting: Family Medicine

## 2022-11-11 VITALS — BP 136/76 | HR 89 | Ht 66.0 in | Wt 214.0 lb

## 2022-11-11 DIAGNOSIS — G8929 Other chronic pain: Secondary | ICD-10-CM | POA: Diagnosis not present

## 2022-11-11 DIAGNOSIS — M170 Bilateral primary osteoarthritis of knee: Secondary | ICD-10-CM

## 2022-11-11 DIAGNOSIS — M25562 Pain in left knee: Secondary | ICD-10-CM | POA: Diagnosis not present

## 2022-11-11 DIAGNOSIS — M25561 Pain in right knee: Secondary | ICD-10-CM | POA: Diagnosis not present

## 2022-11-11 NOTE — Progress Notes (Signed)
Rubin Payor, PhD, LAT, ATC acting as a scribe for Clementeen Graham, MD.  Stephanie Joseph is a 62 y.o. female who presents to [facility] today for 8-wk f/u bilat knee pain. Pt was last seen by Dr. Denyse Amass on 09/16/22 and was given bilat knee steroid injections, advised to use Voltaren gel, and was referred to PT, but canceled her 1st visit. Today, pt reports R knee is especially painful. She notes only a few weeks of relief from prior steroid injection. She had too much going on to attend PT; depression, husband had surgery, etc. She has however been doing home exercise program.  She has been doing quad strengthening exercises as directed by me over the last 2 months.  She states that she does them mostly daily and for about 20 or 30 minutes at a time. She has tried a hinged knee brace provided at an urgent care when she found to be uncomfortable and not effective.  Dx imaging: 09/16/22 R & L knee XR 07/13/20 L knee XR             12/21/17 R knee XR  Pertinent review of systems: No fevers or chills  Relevant historical information: Hypertension and diabetes.   Exam:  BP 136/76   Pulse 89   Ht 5\' 6"  (1.676 m)   Wt 214 lb (97.1 kg)   LMP 02/13/2014 (Within Days)   SpO2 100%   BMI 34.54 kg/m  General: Well Developed, well nourished, and in no acute distress.   MSK: Right knee mild effusion normal-appearing otherwise. Normal motion with crepitation. Intact strength pain with extension. Some laxity to MCL stress test. Positive McMurray's test.  Left knee: Mild effusion otherwise normal-appearing Normal motion with crepitation.  Intact strength some pain with extension is present.  Some laxity to MCL stress test is present. Positive McMurray's test.    Lab and Radiology Results    EXAM: LEFT KNEE 3 VIEWS   COMPARISON:  None Available.   FINDINGS: Mild tricompartmental degenerative changes. No fractures, dislocations, bony lesions, or  joint effusion.   IMPRESSION: Mild tricompartmental degenerative changes.     Electronically Signed   By: Gerome Sam III M.D.   On: 09/16/2022 16:13  EXAM: RIGHT KNEE 3 VIEWS   COMPARISON:  None Available.   FINDINGS: Tricompartmental degenerative changes. Lucency and irregularity along the inferior aspect of the patella suggesting an osteochondral defect. No joint effusion. No other bony abnormalities.   IMPRESSION: 1. Tricompartmental degenerative changes. 2. Lucency and irregularity along the inferior aspect of the patella suggesting an osteochondral defect.     Electronically Signed   By: Gerome Sam III M.D.   On: 09/16/2022 16:12   I, Clementeen Graham, personally (independently) visualized and performed the interpretation of the images attached in this note.   Assessment and Plan: 62 y.o. female with bilateral knee pain thought to be due to DJD.  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  Her pain is not well-controlled.  She has done a good trial of home exercise program over the last 2 months and had a trial of intra-articular steroid injections that only lasted about 2 weeks.  Plan for MRI of the right knee to further evaluate possible OCD lesion.  She may have worse arthritis than is seen on x-ray.  Consider MRI left knee based on how she responds.  Next treatment step would likely be a  hyaluronic acid injection or Zilretta injection.  Will go ahead and work on authorization for both of these options and see her after her MRI so we can make a determination of next steps.   PDMP not reviewed this encounter. Orders Placed This Encounter  Procedures   MR KNEE RIGHT WO CONTRAST    Standing Status:   Future    Standing Expiration Date:   12/12/2022    Order Specific Question:   What is the patient's sedation requirement?    Answer:   No Sedation    Order Specific Question:   Does the patient have a pacemaker or  implanted devices?    Answer:   No    Order Specific Question:   Preferred imaging location?    Answer:   Licensed conveyancer (table limit-350lbs)   No orders of the defined types were placed in this encounter.    Discussed warning signs or symptoms. Please see discharge instructions. Patient expresses understanding.   The above documentation has been reviewed and is accurate and complete Clementeen Graham, M.D.

## 2022-11-11 NOTE — Telephone Encounter (Signed)
Check benefits for visco and Zilretta (MRI pending)

## 2022-11-11 NOTE — Telephone Encounter (Signed)
VOB initiated for Gelsyn for BILAT knee OA.  (Also checking Zilretta)

## 2022-11-11 NOTE — Telephone Encounter (Signed)
Check benefits for Zilretta and Visco 

## 2022-11-11 NOTE — Patient Instructions (Signed)
Thank you for coming in today.   You should hear from MRI scheduling within 1 week. If you do not hear please let me know.    We will also ask about Zilretta and Gel Shots.   Recheck following the MRI results coming back.   Try a knee sleeve.   We can do special custom braces depending on your MRI.

## 2022-11-11 NOTE — Telephone Encounter (Signed)
VOB initiated for Zilretta for BILAT knee OA 

## 2022-11-12 ENCOUNTER — Ambulatory Visit: Attending: Nurse Practitioner | Admitting: Nurse Practitioner

## 2022-11-12 ENCOUNTER — Encounter: Payer: Self-pay | Admitting: Nurse Practitioner

## 2022-11-12 VITALS — BP 110/70 | Ht 66.0 in | Wt 213.2 lb

## 2022-11-12 DIAGNOSIS — I340 Nonrheumatic mitral (valve) insufficiency: Secondary | ICD-10-CM | POA: Diagnosis not present

## 2022-11-12 DIAGNOSIS — Z7985 Long-term (current) use of injectable non-insulin antidiabetic drugs: Secondary | ICD-10-CM

## 2022-11-12 DIAGNOSIS — E118 Type 2 diabetes mellitus with unspecified complications: Secondary | ICD-10-CM | POA: Diagnosis not present

## 2022-11-12 DIAGNOSIS — E1169 Type 2 diabetes mellitus with other specified complication: Secondary | ICD-10-CM

## 2022-11-12 DIAGNOSIS — F419 Anxiety disorder, unspecified: Secondary | ICD-10-CM

## 2022-11-12 DIAGNOSIS — I152 Hypertension secondary to endocrine disorders: Secondary | ICD-10-CM

## 2022-11-12 DIAGNOSIS — E785 Hyperlipidemia, unspecified: Secondary | ICD-10-CM

## 2022-11-12 DIAGNOSIS — E1159 Type 2 diabetes mellitus with other circulatory complications: Secondary | ICD-10-CM | POA: Diagnosis not present

## 2022-11-12 DIAGNOSIS — F32A Depression, unspecified: Secondary | ICD-10-CM

## 2022-11-12 NOTE — Telephone Encounter (Addendum)
Zilretta for BILAT knee OA (Also checking visco)  Primary Insurance: [employer] Co-Pay: n/a Co-Insurance: 25% Deductible: $50 of $50 met Prior Auth: NOT required

## 2022-11-12 NOTE — Patient Instructions (Signed)
Medication Instructions:  Your physician recommends that you continue on your current medications as directed. Please refer to the Current Medication list given to you today.  *If you need a refill on your cardiac medications before your next appointment, please call your pharmacy*   Lab Work: None ordered   Testing/Procedures: Your physician has requested that you have an echocardiogram. Echocardiography is a painless test that uses sound waves to create images of your heart. It provides your doctor with information about the size and shape of your heart and how well your heart's chambers and valves are working. This procedure takes approximately one hour. There are no restrictions for this procedure. Please do NOT wear cologne, perfume, aftershave, or lotions (deodorant is allowed). Please arrive 15 minutes prior to your appointment time.   Follow-Up: At [facility], you and your health needs are our priority.  As part of our continuing mission to provide you with exceptional heart care, we have created designated Provider Care Teams.  These Care Teams include your primary Cardiologist (physician) and Advanced Practice Providers (APPs -  Physician Assistants and Nurse Practitioners) who all work together to provide you with the care you need, when you need it.  We recommend signing up for the patient portal called "MyChart".  Sign up information is provided on this After Visit Summary.  MyChart is used to connect with patients for Virtual Visits (Telemedicine).  Patients are able to view lab/test results, encounter notes, upcoming appointments, etc.  Non-urgent messages can be sent to your provider as well.   To learn more about what you can do with MyChart, go to [email or web address].    Your next appointment:   12 month(s)  Provider:   Charlton Haws, MD  Other Instructions

## 2022-11-12 NOTE — Telephone Encounter (Signed)
Gelsyn for BILAT knee OA (Also checked Zilretta)  Primary Insurance: [employer] Co-Pay: n/a Co-Insurance: 25% Deductible: has been met Prior Auth: NOT required

## 2022-11-13 NOTE — Telephone Encounter (Signed)
Left message for patient to call back to schedule.  °

## 2022-11-22 ENCOUNTER — Ambulatory Visit (INDEPENDENT_AMBULATORY_CARE_PROVIDER_SITE_OTHER)

## 2022-11-22 DIAGNOSIS — G8929 Other chronic pain: Secondary | ICD-10-CM | POA: Diagnosis not present

## 2022-11-22 DIAGNOSIS — M170 Bilateral primary osteoarthritis of knee: Secondary | ICD-10-CM

## 2022-11-22 DIAGNOSIS — M25561 Pain in right knee: Secondary | ICD-10-CM

## 2022-11-24 ENCOUNTER — Other Ambulatory Visit: Payer: Self-pay | Admitting: *Deleted

## 2022-11-24 DIAGNOSIS — I152 Hypertension secondary to endocrine disorders: Secondary | ICD-10-CM

## 2022-11-25 ENCOUNTER — Other Ambulatory Visit: Payer: Self-pay | Admitting: *Deleted

## 2022-11-25 ENCOUNTER — Telehealth: Payer: Self-pay | Admitting: Emergency Medicine

## 2022-11-25 DIAGNOSIS — I152 Hypertension secondary to endocrine disorders: Secondary | ICD-10-CM

## 2022-11-25 MED ORDER — TIRZEPATIDE 5 MG/0.5ML ~~LOC~~ SOAJ
5.0000 mg | SUBCUTANEOUS | 3 refills | Status: DC
Start: 2022-11-25 — End: 2023-10-07

## 2022-11-25 NOTE — Telephone Encounter (Signed)
Prescription Request  11/25/2022  LOV: 09/18/2022  What is the name of the medication or equipment? tirzepatide [facility]) 5 MG/0.5ML Pen   [facility] - [place], [place] - [address] [facility] [address] [place] [address] Phone: [phone] Fax: [phone]  Have you contacted your pharmacy to request a refill? No    Which pharmacy would you like this sent to? Patient notified that their request is being sent to the clinical staff for review and that they should receive a response within 2 business days.   Please advise at Mobile [phone] (mobile)

## 2022-11-25 NOTE — Telephone Encounter (Signed)
New prescription sent to patient requested pharmacy  

## 2022-12-01 ENCOUNTER — Telehealth: Admitting: Nurse Practitioner

## 2022-12-01 ENCOUNTER — Telehealth

## 2022-12-01 DIAGNOSIS — J40 Bronchitis, not specified as acute or chronic: Secondary | ICD-10-CM | POA: Diagnosis not present

## 2022-12-01 MED ORDER — AZITHROMYCIN 250 MG PO TABS
ORAL_TABLET | ORAL | 0 refills | Status: AC
Start: 2022-12-01 — End: 2022-12-06

## 2022-12-01 MED ORDER — BENZONATATE 100 MG PO CAPS
100.0000 mg | ORAL_CAPSULE | Freq: Three times a day (TID) | ORAL | 0 refills | Status: DC | PRN
Start: 2022-12-01 — End: 2023-03-12

## 2022-12-01 NOTE — Progress Notes (Signed)
Virtual Visit Consent   Stephanie Joseph, you are scheduled for a virtual visit with a [facility] provider today. Just as with appointments in the office, your consent must be obtained to participate. Your consent will be active for this visit and any virtual visit you may have with one of our providers in the next 365 days. If you have a MyChart account, a copy of this consent can be sent to you electronically.  As this is a virtual visit, video technology does not allow for your provider to perform a traditional examination. This may limit your provider's ability to fully assess your condition. If your provider identifies any concerns that need to be evaluated in person or the need to arrange testing (such as labs, EKG, etc.), we will make arrangements to do so. Although advances in technology are sophisticated, we cannot ensure that it will always work on either your end or our end. If the connection with a video visit is poor, the visit may have to be switched to a telephone visit. With either a video or telephone visit, we are not always able to ensure that we have a secure connection.  By engaging in this virtual visit, you consent to the provision of healthcare and authorize for your insurance to be billed (if applicable) for the services provided during this visit. Depending on your insurance coverage, you may receive a charge related to this service.  I need to obtain your verbal consent now. Are you willing to proceed with your visit today? Stephanie Joseph has provided verbal consent on 12/01/2022 for a virtual visit (video or telephone). Stephanie Simas, FNP  Date: 12/01/2022 12:36 PM  Virtual Visit via Video Note   I, Stephanie Joseph, connected with  Stephanie Joseph  ([id number], 1960/07/10) on 12/01/22 at 12:45 PM EDT by a video-enabled telemedicine application and verified that I am speaking with the correct person using two identifiers.  Location: Patient:  Virtual Visit Location Patient: Home Provider: Virtual Visit Location Provider: Home Office   I discussed the limitations of evaluation and management by telemedicine and the availability of in person appointments. The patient expressed understanding and agreed to proceed.    History of Present Illness: Stephanie Joseph is a 62 y.o. who identifies as a female who was assigned female at birth, and is being seen today for a cough that she has been suffering from for over one week now.  She has also had a runny nose  Cough is productive  Also has a sore throat  She has been using sore throat spray and nasal sprays  Ribs are now sore from coughing   Denies a history of asthma or COPD She has needed an inhaler in the past  Most recently had bronchitis in January of this year   Denies a fever  Problems:  Patient Active Problem List   Diagnosis Date Noted   Bilateral tinnitus 09/18/2022   Problem of both ears 09/18/2022   Chronic left hip pain 04/01/2022   Hot flashes due to menopause 09/24/2021   Dizziness 07/31/2021   Chronic midline low back pain 07/12/2018   Glaucoma 05/21/2017   Class 1 obesity due to excess calories with serious comorbidity and body mass index (BMI) of 33.0 to 33.9 in adult 09/19/2016   Vitamin D deficiency 09/19/2016   Dyslipidemia associated with type 2 diabetes mellitus (HCC) 07/10/2015   Hormone replacement therapy (postmenopausal) 10/04/2014   Mitral regurgitation 11/05/2012   Valvular heart disease 10/21/2011  Menorrhagia, premenopausal 09/01/2011   Anxiety and depression 03/04/2010   GOUT 12/16/2006   ANEMIA, IRON DEFICIENCY NOS 12/16/2006   Current smoker 12/16/2006   Hypertension associated with diabetes (HCC) 12/16/2006    Allergies: No Known Allergies Medications:  Current Outpatient Medications:    albuterol (VENTOLIN HFA) 108 (90 Base) MCG/ACT inhaler, Inhale 2 puffs into the lungs every 6 (six) hours as needed for wheezing or  shortness of breath., Disp: 8 g, Rfl: 0   amLODipine (NORVASC) 10 MG tablet, TAKE ONE TABLET BY MOUTH EVERY DAY, Disp: 90 tablet, Rfl: 3   atorvastatin (LIPITOR) 10 MG tablet, TAKE ONE TABLET BY MOUTH EVERY DAY, Disp: 90 tablet, Rfl: 3   azelastine (ASTELIN) 0.1 % nasal spray, Place 1 spray into both nostrils 2 (two) times daily. Use in each nostril as directed, Disp: 30 mL, Rfl: 0   benzonatate (TESSALON) 100 MG capsule, Take 1 capsule (100 mg total) by mouth 2 (two) times daily as needed for cough., Disp: 20 capsule, Rfl: 0   brimonidine-timolol (COMBIGAN) 0.2-0.5 % ophthalmic solution, Place 1 drop into both eyes every 12 (twelve) hours., Disp: , Rfl:    busPIRone (BUSPAR) 7.5 MG tablet, Take 1 tablet (7.5 mg total) by mouth 2 (two) times daily., Disp: 60 tablet, Rfl: 0   estrogen, conjugated,-medroxyprogesterone (PREMPRO) 0.3-1.5 MG tablet, Take 1 tablet by mouth daily., Disp: 30 tablet, Rfl: 8   fluticasone (FLONASE) 50 MCG/ACT nasal spray, Place 2 sprays into both nostrils daily., Disp: 16 g, Rfl: 6   glimepiride (AMARYL) 2 MG tablet, TAKE ONE TABLET BY MOUTH EVERY DAY WITH BREAKFAST, Disp: 90 tablet, Rfl: 1   glucose blood (ACCU-CHEK AVIVA) test strip, Use to check blood sugars 1-3 times daily. Dx Code: E11.9, Disp: 300 each, Rfl: 1   hydrOXYzine (ATARAX) 25 MG tablet, Take 1 tablet (25 mg total) by mouth every 8 (eight) hours as needed for itching., Disp: 360 tablet, Rfl: 1   Lancets (ACCU-CHEK SOFT TOUCH) lancets, Use as instructed, Disp: 100 each, Rfl: 12   losartan-hydrochlorothiazide (HYZAAR) 100-12.5 MG tablet, Take 1 tablet by mouth daily., Disp: 90 tablet, Rfl: 3   Misc. Devices (CANE) MISC, Use as needed for left knee pain., Disp: 1 each, Rfl: 0   naproxen (NAPROSYN) 375 MG tablet, Take 1 tablet (375 mg total) by mouth 2 (two) times daily as needed for moderate pain., Disp: 180 tablet, Rfl: 0   QUEtiapine Fumarate (SEROQUEL XR) 150 MG 24 hr tablet, Take 1 tablet (150 mg total) by  mouth at bedtime., Disp: 30 tablet, Rfl: 2   tirzepatide (MOUNJARO) 5 MG/0.5ML Pen, Inject 5 mg into the skin once a week., Disp: 6 mL, Rfl: 3   TRAVEL SICKNESS 25 MG CHEW, CHEW ONE TABLET BY MOUTH THREE TIMES A DAY - AS NEEDED FOR DIZZINESS (TABLETS MAY BE CHEWED OR SWALLOWED WHOLE), Disp: 30 tablet, Rfl: 1   venlafaxine (EFFEXOR) 75 MG tablet, Take 1 tablet (75 mg total) by mouth 3 (three) times daily with meals., Disp: 270 tablet, Rfl: 1  Observations/Objective: Patient is well-developed, well-nourished in no acute distress.  Resting comfortably  at home.  Head is normocephalic, atraumatic.  No labored breathing.  Speech is clear and coherent with logical content.  Patient is alert and oriented at baseline.    Assessment and Plan: 1. Bronchitis  - azithromycin (ZITHROMAX) 250 MG tablet; Take 2 tablets on day 1, then 1 tablet daily on days 2 through 5  Dispense: 6 tablet; Refill: 0 - benzonatate (  TESSALON) 100 MG capsule; Take 1 capsule (100 mg total) by mouth 3 (three) times daily as needed.  Dispense: 30 capsule; Refill: 0    Use Albuterol inhaler as needed and continue Flonase nasal spray daily   Increase fluids and assure protein intake    Follow Up Instructions: I discussed the assessment and treatment plan with the patient. The patient was provided an opportunity to ask questions and all were answered. The patient agreed with the plan and demonstrated an understanding of the instructions.  A copy of instructions were sent to the patient via MyChart unless otherwise noted below.    The patient was advised to call back or seek an in-person evaluation if the symptoms worsen or if the condition fails to improve as anticipated.  Time:  I spent 15 minutes with the patient via telehealth technology discussing the above problems/concerns.    Stephanie Simas, FNP

## 2022-12-02 NOTE — Progress Notes (Signed)
MRI shows some arthritis changes in the knee including what looks like a pothole underneath your kneecap.  There is additionally some meniscus tearing. This would make sense to try gel shots or Zilretta injections.  Recommend scheduling a recheck with me but let me know if you would like to do the injections gel shots or Zilretta before you come in and that way we can make sure we have the medicine in stock.  You have been authorized for both it looks like.

## 2022-12-04 NOTE — Telephone Encounter (Signed)
Patient called and said that the [place] told her that her insulin in on backorder.  She wants to know what she should do.  Please call her at:  [phone]

## 2022-12-05 ENCOUNTER — Ambulatory Visit: Admitting: Family Medicine

## 2022-12-05 NOTE — Progress Notes (Deleted)
   Rubin Payor, PhD, LAT, ATC acting as a scribe for Clementeen Graham, MD.  Stephanie Joseph is a 62 y.o. female who presents to [facility] at [facility] today for f/u bilat knee pain w/ R knee MRI review and Zilretta injection. Pt was last seen by Dr. Denyse Amass on 11/11/22 and was advised to proceed to R knee MRI and we would work on authorizing gel and Designer, multimedia. Today, pt reports ***  Dx imaging: 11/22/22 R knee MRI 09/16/22 R & L knee XR 07/13/20 L knee XR             12/21/17 R knee XR  Pertinent review of systems: ***  Relevant historical information: ***   Exam:  LMP 02/13/2014 (Within Days)  General: Well Developed, well nourished, and in no acute distress.   MSK: ***    Lab and Radiology Results No results found for this or any previous visit (from the past 72 hour(s)). No results found.     Assessment and Plan: 62 y.o. female with ***   PDMP not reviewed this encounter. No orders of the defined types were placed in this encounter.  No orders of the defined types were placed in this encounter.    Discussed warning signs or symptoms. Please see discharge instructions. Patient expresses understanding.   ***

## 2022-12-05 NOTE — Telephone Encounter (Signed)
Called patient and informed her to call her insurance company to see what alternatives to the [facility] is covered. Once she does she will call the office and Dr. Alvy Bimler can send in a new prescription

## 2022-12-08 ENCOUNTER — Ambulatory Visit: Admitting: Family Medicine

## 2022-12-09 NOTE — Progress Notes (Unsigned)
Rubin Payor, PhD, LAT, ATC acting as a scribe for Clementeen Graham, MD.   Stephanie Joseph is a 62 y.o. female who presents to [facility] at [facility] today for f/u bilat knee pain w/ R knee MRI review and Zilretta injection. Pt was last seen by Dr. Denyse Amass on 11/11/22 and was advised to proceed to R knee MRI and we would work on authorizing gel and Designer, multimedia. Today, pt reports R knee pain is about the same.   Dx imaging: 11/22/22 R knee MRI 09/16/22 R & L knee XR 07/13/20 L knee XR             12/21/17 R knee XR   Pertinent review of systems: No fevers or chills  Relevant historical information: Hypertension and diabetes   Exam:  BP 136/82   Ht 5\' 6"  (1.676 m)   Wt 213 lb (96.6 kg)   LMP 02/13/2014 (Within Days)   BMI 34.38 kg/m  General: Well Developed, well nourished, and in no acute distress.   MSK: Right knee mild effusion normal-appearing otherwise. Normal motion with crepitation.    Lab and Radiology Results  Zilretta injection right knee Procedure: Real-time Ultrasound Guided Injection of right knee joint superior lateral patellar space Device: Philips Affiniti 50G Images permanently stored and available for review in PACS Verbal informed consent obtained.  Discussed risks and benefits of procedure. Warned about infection, bleeding, hyperglycemia damage to structures among others. Patient expresses understanding and agreement Time-out conducted.   Noted no overlying erythema, induration, or other signs of local infection.   Skin prepped in a sterile fashion.   Local anesthesia: Topical Ethyl chloride.   With sterile technique and under real time ultrasound guidance: Zilretta 32 mg injected into knee joint. Fluid seen entering the joint capsule.   Completed without difficulty   Advised to call if fevers/chills, erythema, induration, drainage, or persistent bleeding.   Images permanently stored and available for review in the ultrasound unit.   Impression: Technically successful ultrasound guided injection. Lot number: [id number]   EXAM: MRI OF THE RIGHT KNEE WITHOUT CONTRAST   TECHNIQUE: Multiplanar, multisequence MR imaging of the knee was performed. No intravenous contrast was administered.   COMPARISON:  None Available.   FINDINGS: MENISCI   Medial: Nondisplaced degenerative fraying and tearing of the inner zone of the posterior horn medial meniscus.   Lateral: Small nondisplaced horizontal tear at the posterior horn-body junction.   LIGAMENTS   Cruciates: ACL and PCL are intact.   Collaterals: Medial collateral ligament is intact. Lateral collateral ligament complex is intact.   CARTILAGE   Patellofemoral: Focal high-grade chondral defect along the inner aspect of the lateral patellar facet with slight delamination measuring 3 x 6 mm, and with underlying subchondral cystic change. Low-grade fissuring of the medial patellar facet.   Medial: Areas of intermediate and high-grade cartilage loss along the weight-bearing surfaces.   Lateral:  Intermediate grade partial thickness cartilage loss.   JOINT: No significant joint effusion.   POPLITEAL FOSSA: Miniscule Baker's cyst.   EXTENSOR MECHANISM: Intact quadriceps tendon. Intact patellar tendon.   BONES: No evidence of acute fracture. No aggressive osseous lesion. There is focal subchondral marrow edema in the posterior aspect of the lateral tibial plateau likely related to arthritis and the focal meniscus tear.   Other: No focal fluid collection. Mild soft tissue swelling along the knee.   IMPRESSION: Tricompartment osteoarthritis, worst in the medial and patellofemoral compartments, cartilaginous abnormalities as described above.  Nondisplaced degenerative fraying and tearing of the inner zone of the posterior horn medial meniscus.   Small nondisplaced horizontal tear at the posterior horn-body junction of the lateral meniscus. Focal marrow  edema in the adjacent posterolateral tibial plateau.     Electronically Signed   By: Caprice Renshaw M.D.   On: 11/28/2022 13:14   I, Clementeen Graham, personally (independently) visualized and performed the interpretation of the images attached in this note.     Assessment and Plan: 62 y.o. female with right knee pain.  Primarily due to to DJD.  Plan for Zilretta injection today.  Hyaluronic acid is also authorized and could be used in the future.  Ultimately she may require total knee replacement.  She will keep me updated how she feels.   PDMP not reviewed this encounter. Orders Placed This Encounter  Procedures   [place] LIMITED JOINT SPACE STRUCTURES LOW RIGHT(NO LINKED CHARGES)    Order Specific Question:   Reason for Exam (SYMPTOM  OR DIAGNOSIS REQUIRED)    Answer:   right knee pain    Order Specific Question:   Preferred imaging location?    Answer:   Adult nurse [facility] ordered this encounter  Medications   Triamcinolone Acetonide (ZILRETTA) intra-articular injection 32 mg     Discussed warning signs or symptoms. Please see discharge instructions. Patient expresses understanding.   The above documentation has been reviewed and is accurate and complete Clementeen Graham, M.D.

## 2022-12-10 ENCOUNTER — Other Ambulatory Visit: Payer: Self-pay

## 2022-12-10 ENCOUNTER — Ambulatory Visit (INDEPENDENT_AMBULATORY_CARE_PROVIDER_SITE_OTHER): Admitting: Family Medicine

## 2022-12-10 VITALS — BP 136/82 | Ht 66.0 in | Wt 213.0 lb

## 2022-12-10 DIAGNOSIS — M170 Bilateral primary osteoarthritis of knee: Secondary | ICD-10-CM

## 2022-12-10 DIAGNOSIS — M25561 Pain in right knee: Secondary | ICD-10-CM | POA: Diagnosis not present

## 2022-12-10 DIAGNOSIS — G8929 Other chronic pain: Secondary | ICD-10-CM | POA: Diagnosis not present

## 2022-12-10 MED ORDER — TRIAMCINOLONE ACETONIDE 32 MG IX SRER
32.0000 mg | Freq: Once | INTRA_ARTICULAR | Status: AC
  Administered 2022-12-10: 32 mg via INTRA_ARTICULAR

## 2022-12-10 NOTE — Patient Instructions (Signed)
Thank you for coming in today.   You received an injection today. Seek immediate medical attention if the joint becomes red, extremely painful, or is oozing fluid.  

## 2022-12-12 NOTE — Telephone Encounter (Signed)
Pt received Zilretta inj for RIGHT knee OA 12/10/22.  Can consider repeat on or after 03/05/23

## 2022-12-12 NOTE — Telephone Encounter (Signed)
Pt received ZILRETTA injection 12/10/22.

## 2022-12-16 ENCOUNTER — Ambulatory Visit (HOSPITAL_COMMUNITY): Attending: Nurse Practitioner

## 2022-12-16 DIAGNOSIS — E1159 Type 2 diabetes mellitus with other circulatory complications: Secondary | ICD-10-CM | POA: Diagnosis present

## 2022-12-16 DIAGNOSIS — E118 Type 2 diabetes mellitus with unspecified complications: Secondary | ICD-10-CM | POA: Insufficient documentation

## 2022-12-16 DIAGNOSIS — E785 Hyperlipidemia, unspecified: Secondary | ICD-10-CM | POA: Diagnosis present

## 2022-12-16 DIAGNOSIS — I152 Hypertension secondary to endocrine disorders: Secondary | ICD-10-CM | POA: Insufficient documentation

## 2022-12-16 DIAGNOSIS — I340 Nonrheumatic mitral (valve) insufficiency: Secondary | ICD-10-CM | POA: Diagnosis not present

## 2022-12-16 DIAGNOSIS — E1169 Type 2 diabetes mellitus with other specified complication: Secondary | ICD-10-CM | POA: Diagnosis present

## 2022-12-16 LAB — ECHOCARDIOGRAM COMPLETE
Area-P 1/2: 3.78 cm2
MV M vel: 4.62 m/s
MV Peak grad: 85.4 mmHg
P 1/2 time: 364 msec
Radius: 0.5 cm
S' Lateral: 3.7 cm

## 2022-12-17 ENCOUNTER — Telehealth: Payer: Self-pay | Admitting: Nurse Practitioner

## 2022-12-17 NOTE — Telephone Encounter (Signed)
Patient has been notified. See result note. 

## 2022-12-17 NOTE — Telephone Encounter (Signed)
Patient returned call for her test results.  °

## 2023-01-22 ENCOUNTER — Emergency Department (HOSPITAL_BASED_OUTPATIENT_CLINIC_OR_DEPARTMENT_OTHER)

## 2023-01-22 ENCOUNTER — Emergency Department (HOSPITAL_BASED_OUTPATIENT_CLINIC_OR_DEPARTMENT_OTHER)
Admission: EM | Admit: 2023-01-22 | Discharge: 2023-01-22 | Disposition: A | Discharge status: Home / Self Care | Attending: Emergency Medicine | Admitting: Emergency Medicine

## 2023-01-22 ENCOUNTER — Encounter (HOSPITAL_BASED_OUTPATIENT_CLINIC_OR_DEPARTMENT_OTHER): Payer: Self-pay

## 2023-01-22 ENCOUNTER — Ambulatory Visit
Admission: EM | Admit: 2023-01-22 | Discharge: 2023-01-22 | Disposition: A | Discharge status: Home / Self Care | Attending: Internal Medicine | Admitting: Internal Medicine

## 2023-01-22 DIAGNOSIS — I10 Essential (primary) hypertension: Secondary | ICD-10-CM | POA: Insufficient documentation

## 2023-01-22 DIAGNOSIS — M79601 Pain in right arm: Secondary | ICD-10-CM | POA: Insufficient documentation

## 2023-01-22 DIAGNOSIS — Z79899 Other long term (current) drug therapy: Secondary | ICD-10-CM | POA: Insufficient documentation

## 2023-01-22 DIAGNOSIS — M7989 Other specified soft tissue disorders: Secondary | ICD-10-CM

## 2023-01-22 DIAGNOSIS — R2230 Localized swelling, mass and lump, unspecified upper limb: Secondary | ICD-10-CM | POA: Diagnosis present

## 2023-01-22 MED ORDER — ACETAMINOPHEN 325 MG PO TABS
650.0000 mg | ORAL_TABLET | Freq: Once | ORAL | Status: AC
  Administered 2023-01-22: 650 mg via ORAL
  Filled 2023-01-22: qty 2

## 2023-01-22 NOTE — ED Triage Notes (Signed)
C/o swelling to right upper arm since yesterday, states has a "weird feeling" x 1.5 weeks. Denies injury. Sent here by UC.

## 2023-01-22 NOTE — ED Provider Notes (Signed)
[facility]    CSN: [id number] Arrival date & time: 01/22/23  4098      History   Chief Complaint No chief complaint on file.   HPI Stephanie Joseph is a 62 y.o. female presents for evaluation of arm swelling.  Patient reports she has had numbness and tingling in her right upper arm for 2 weeks and yesterday noticed swelling, redness, and warmth along with a pressure sensation.  Denies any known injury.  No chest pain or shortness of breath.  No history of MRSA, cellulitis, DVT.  Has not taken any OTC medications for symptoms.  No other concerns at this time.  HPI  Past Medical History:  Diagnosis Date   Anemia, iron deficiency    Arthritis    Depression    Diabetes mellitus without complication (HCC)    Dysmenorrhea    Gout    Heart valve problem    Hypertension    Menorrhagia    Mitral regurgitation    Pneumonia     Patient Active Problem List   Diagnosis Date Noted   Bilateral tinnitus 09/18/2022   Problem of both ears 09/18/2022   Chronic left hip pain 04/01/2022   Hot flashes due to menopause 09/24/2021   Dizziness 07/31/2021   Chronic midline low back pain 07/12/2018   Glaucoma 05/21/2017   Class 1 obesity due to excess calories with serious comorbidity and body mass index (BMI) of 33.0 to 33.9 in adult 09/19/2016   Vitamin D deficiency 09/19/2016   Dyslipidemia associated with type 2 diabetes mellitus (HCC) 07/10/2015   Hormone replacement therapy (postmenopausal) 10/04/2014   Mitral regurgitation 11/05/2012   Valvular heart disease 10/21/2011   Menorrhagia, premenopausal 09/01/2011   Anxiety and depression 03/04/2010   GOUT 12/16/2006   ANEMIA, IRON DEFICIENCY NOS 12/16/2006   Current smoker 12/16/2006   Hypertension associated with diabetes (HCC) 12/16/2006    Past Surgical History:  Procedure Laterality Date   HAMMER TOE SURGERY     KNEE ARTHROSCOPY     Left    OB History     Gravida  3   Para  1   Term      Preterm   1   AB  2   Living  1      SAB  1   IAB  1   Ectopic      Multiple      Live Births               Home Medications    Prior to Admission medications   Medication Sig Start Date End Date Taking? Authorizing Provider  albuterol (VENTOLIN HFA) 108 (90 Base) MCG/ACT inhaler Inhale 2 puffs into the lungs every 6 (six) hours as needed for wheezing or shortness of breath. 08/03/22   Crain, Whitney L, PA  amLODipine (NORVASC) 10 MG tablet TAKE ONE TABLET BY MOUTH EVERY DAY 06/23/22   Georgina Quint, MD  atorvastatin (LIPITOR) 10 MG tablet TAKE ONE TABLET BY MOUTH EVERY DAY 06/01/22   Georgina Quint, MD  azelastine (ASTELIN) 0.1 % nasal spray Place 1 spray into both nostrils 2 (two) times daily. Use in each nostril as directed 08/03/22   Margaretann Loveless, PA-C  benzonatate (TESSALON) 100 MG capsule Take 1 capsule (100 mg total) by mouth 3 (three) times daily as needed. 12/01/22   [name], FNP  brimonidine-timolol (COMBIGAN) 0.2-0.5 % ophthalmic solution Place 1 drop into both eyes every 12 (twelve) hours.  [provider]  busPIRone (BUSPAR) 7.5 MG tablet Take 1 tablet (7.5 mg total) by mouth 2 (two) times daily. 10/27/22   Georgina Quint, MD  estrogen, conjugated,-medroxyprogesterone (PREMPRO) 0.3-1.5 MG tablet Take 1 tablet by mouth daily. 10/29/22   Warden Fillers, MD  fluticasone (FLONASE) 50 MCG/ACT nasal spray Place 2 sprays into both nostrils daily. 05/13/21   Eden Emms, NP  glimepiride (AMARYL) 2 MG tablet TAKE ONE TABLET BY MOUTH EVERY DAY WITH BREAKFAST 06/23/22   Georgina Quint, MD  glucose blood (ACCU-CHEK AVIVA) test strip Use to check blood sugars 1-3 times daily. Dx Code: E11.9 01/20/18   Olive Bass, FNP  hydrOXYzine (ATARAX) 25 MG tablet Take 1 tablet (25 mg total) by mouth every 8 (eight) hours as needed for itching. 04/01/22   Georgina Quint, MD  Lancets (ACCU-CHEK SOFT TOUCH) lancets Use as  instructed 05/21/17   Meryl Dare, NP  losartan-hydrochlorothiazide (HYZAAR) 100-12.5 MG tablet Take 1 tablet by mouth daily. 09/15/22   Georgina Quint, MD  Misc. Devices (CANE) MISC Use as needed for left knee pain. 01/29/18   Dragnev, Alphonse Guild, NP  naproxen (NAPROSYN) 375 MG tablet Take 1 tablet (375 mg total) by mouth 2 (two) times daily as needed for moderate pain. 10/28/22   Louann Sjogren, DPM  QUEtiapine Fumarate (SEROQUEL XR) 150 MG 24 hr tablet Take 1 tablet (150 mg total) by mouth at bedtime. 10/10/21   Penn, Cranston Neighbor, NP  tirzepatide [facility]) 5 MG/0.5ML Pen Inject 5 mg into the skin once a week. 11/25/22   SagardiaEilleen Kempf, MD  TRAVEL SICKNESS 25 MG CHEW CHEW ONE TABLET BY MOUTH THREE TIMES A DAY - AS NEEDED FOR DIZZINESS (TABLETS MAY BE CHEWED OR SWALLOWED WHOLE) 06/23/22   Georgina Quint, MD  venlafaxine [facility]) 75 MG tablet Take 1 tablet (75 mg total) by mouth 3 (three) times daily with meals. 09/26/21   Penn, Cranston Neighbor, NP    Family History Family History  Problem Relation Age of Onset   Hypertension Mother    Stroke Paternal Grandmother    Heart disease Sister    Heart disease Brother     Social History Social History   Tobacco Use   Smoking status: Every Day    Current packs/day: 0.50    Average packs/day: 0.5 packs/day for 7.0 years (3.5 ttl pk-yrs)    Types: Cigarettes   Smokeless tobacco: Never  Vaping Use   Vaping status: Never Used  Substance Use Topics   Alcohol use: No   Drug use: Not Currently    Types: "Crack" cocaine     Allergies   Patient has no known allergies.   Review of Systems Review of Systems  Skin:        Right upper arm swelling     Physical Exam Triage Vital Signs ED Triage Vitals  Encounter Vitals Group     BP 01/22/23 0927 125/80     Systolic BP Percentile --      Diastolic BP Percentile --      Pulse Rate 01/22/23 0927 83     Resp 01/22/23 0927 16     Temp 01/22/23 0927 98.1 F (36.7  C)     Temp Source 01/22/23 0927 Oral     SpO2 01/22/23 0927 98 %     Weight --      Height --      Head Circumference --      Peak Flow --  Pain Score 01/22/23 0930 0     Pain Loc --      Pain Education --      Exclude from Growth Chart --    No data found.  Updated Vital Signs BP 125/80 (BP Location: Left Arm)   Pulse 83   Temp 98.1 F (36.7 C) (Oral)   Resp 16   LMP 02/13/2014 (Within Days)   SpO2 98%   Visual Acuity Right Eye Distance:   Left Eye Distance:   Bilateral Distance:    Right Eye Near:   Left Eye Near:    Bilateral Near:     Physical Exam Vitals and nursing note reviewed.  Constitutional:      General: She is not in acute distress.    Appearance: Normal appearance. She is not ill-appearing.  HENT:     Head: Normocephalic and atraumatic.  Eyes:     Pupils: Pupils are equal, round, and reactive to light.  Cardiovascular:     Rate and Rhythm: Normal rate.  Pulmonary:     Effort: Pulmonary effort is normal.  Skin:    General: Skin is warm and dry.          Comments: There is some mild swelling of the lateral right mid upper arm with warmth and mild erythema.  Area is tender to palpation.  Skin is intact without wounds or abrasions.  Neurological:     General: No focal deficit present.     Mental Status: She is alert and oriented to person, place, and time.  Psychiatric:        Mood and Affect: Mood normal.        Behavior: Behavior normal.      UC Treatments / Results  Labs (all labs ordered are listed, but only abnormal results are displayed) Labs Reviewed - No data to display  EKG   Radiology No results found.  Procedures Procedures (including critical care time)  Medications Ordered in UC Medications - No data to display  Initial Impression / Assessment and Plan / UC Course  I have reviewed the triage vital signs and the nursing notes.  Pertinent labs & imaging results that were available during my care of the patient  were reviewed by me and considered in my medical decision making (see chart for details).     Reviewed exam and symptoms with patient.  Unclear cause of left upper arm swelling.  Does not appear consistent with cellulitis, concern for possible superficial clot/DVT.  Advise she go to the ER for further workup of her symptoms.  Patient is in agreement with plan will go POV to the ER. Final Clinical Impressions(s) / UC Diagnoses   Final diagnoses:  Arm swelling     Discharge Instructions      Please go to the ER for further evaluation of your symptoms    ED Prescriptions   None    PDMP not reviewed this encounter.   Radford Pax, NP 01/22/23 [phone]

## 2023-01-22 NOTE — ED Provider Notes (Signed)
[facility] Provider Note   CSN: [id number] Arrival date & time: 01/22/23  1001     History  Chief Complaint  Patient presents with   Arm Swelling    Stephanie Joseph is a 62 y.o. female.  Patient here for DVT study from urgent care.  Having some right arm pain and swelling this morning.  She sleeps often on the side.  She denies any trauma.  No fever or chills.  No significant medical history other than high cholesterol hypertension.  She denies any history of clots.  No chest pain or shortness of breath or other numbness weakness or tingling.  The history is provided by the patient.       Home Medications Prior to Admission medications   Medication Sig Start Date End Date Taking? Authorizing Provider  albuterol (VENTOLIN HFA) 108 (90 Base) MCG/ACT inhaler Inhale 2 puffs into the lungs every 6 (six) hours as needed for wheezing or shortness of breath. 08/03/22   Crain, Whitney L, PA  amLODipine (NORVASC) 10 MG tablet TAKE ONE TABLET BY MOUTH EVERY DAY 06/23/22   Georgina Quint, MD  atorvastatin (LIPITOR) 10 MG tablet TAKE ONE TABLET BY MOUTH EVERY DAY 06/01/22   Georgina Quint, MD  azelastine (ASTELIN) 0.1 % nasal spray Place 1 spray into both nostrils 2 (two) times daily. Use in each nostril as directed 08/03/22   Margaretann Loveless, PA-C  benzonatate (TESSALON) 100 MG capsule Take 1 capsule (100 mg total) by mouth 3 (three) times daily as needed. 12/01/22   [name], FNP  brimonidine-timolol (COMBIGAN) 0.2-0.5 % ophthalmic solution Place 1 drop into both eyes every 12 (twelve) hours.    [provider]  busPIRone (BUSPAR) 7.5 MG tablet Take 1 tablet (7.5 mg total) by mouth 2 (two) times daily. 10/27/22   Georgina Quint, MD  estrogen, conjugated,-medroxyprogesterone (PREMPRO) 0.3-1.5 MG tablet Take 1 tablet by mouth daily. 10/29/22   Warden Fillers, MD  fluticasone (FLONASE) 50 MCG/ACT nasal  spray Place 2 sprays into both nostrils daily. 05/13/21   Eden Emms, NP  glimepiride (AMARYL) 2 MG tablet TAKE ONE TABLET BY MOUTH EVERY DAY WITH BREAKFAST 06/23/22   Georgina Quint, MD  glucose blood (ACCU-CHEK AVIVA) test strip Use to check blood sugars 1-3 times daily. Dx Code: E11.9 01/20/18   Olive Bass, FNP  hydrOXYzine (ATARAX) 25 MG tablet Take 1 tablet (25 mg total) by mouth every 8 (eight) hours as needed for itching. 04/01/22   Georgina Quint, MD  Lancets (ACCU-CHEK SOFT TOUCH) lancets Use as instructed 05/21/17   Meryl Dare, NP  losartan-hydrochlorothiazide (HYZAAR) 100-12.5 MG tablet Take 1 tablet by mouth daily. 09/15/22   Georgina Quint, MD  Misc. Devices (CANE) MISC Use as needed for left knee pain. 01/29/18   Dragnev, Alphonse Guild, NP  naproxen (NAPROSYN) 375 MG tablet Take 1 tablet (375 mg total) by mouth 2 (two) times daily as needed for moderate pain. 10/28/22   Louann Sjogren, DPM  QUEtiapine Fumarate (SEROQUEL XR) 150 MG 24 hr tablet Take 1 tablet (150 mg total) by mouth at bedtime. 10/10/21   Penn, Cranston Neighbor, NP  tirzepatide [facility]) 5 MG/0.5ML Pen Inject 5 mg into the skin once a week. 11/25/22   Georgina Quint, MD  TRAVEL SICKNESS 25 MG CHEW CHEW ONE TABLET BY MOUTH THREE TIMES A DAY - AS NEEDED FOR DIZZINESS (TABLETS MAY BE CHEWED OR SWALLOWED WHOLE)  06/23/22   Georgina Quint, MD  venlafaxine (EFFEXOR) 75 MG tablet Take 1 tablet (75 mg total) by mouth 3 (three) times daily with meals. 09/26/21   Mcneil Sober, NP      Allergies    Patient has no known allergies.    Review of Systems   Review of Systems  Physical Exam Updated Vital Signs BP 134/73 (BP Location: Left Arm)   Pulse 81   Temp 97.7 F (36.5 C)   Resp 18   Ht 5\' 7"  (1.702 m)   Wt 96.6 kg   LMP 02/13/2014 (Within Days)   SpO2 100%   BMI 33.36 kg/m  Physical Exam Vitals and nursing note reviewed.  Constitutional:      General: She is  not in acute distress.    Appearance: She is well-developed. She is not ill-appearing.  HENT:     Head: Normocephalic and atraumatic.     Nose: Nose normal.     Mouth/Throat:     Mouth: Mucous membranes are moist.  Eyes:     Extraocular Movements: Extraocular movements intact.     Conjunctiva/sclera: Conjunctivae normal.     Pupils: Pupils are equal, round, and reactive to light.  Cardiovascular:     Rate and Rhythm: Normal rate and regular rhythm.     Pulses: Normal pulses.     Heart sounds: Normal heart sounds. No murmur heard. Pulmonary:     Effort: Pulmonary effort is normal. No respiratory distress.     Breath sounds: Normal breath sounds.  Abdominal:     Palpations: Abdomen is soft.     Tenderness: There is no abdominal tenderness.  Musculoskeletal:        General: Tenderness present. No swelling.     Cervical back: Neck supple.     Comments: Bit tender in the right triceps area but there is no major swelling or redness  Skin:    General: Skin is warm and dry.     Capillary Refill: Capillary refill takes less than 2 seconds.  Neurological:     General: No focal deficit present.     Mental Status: She is alert.     Sensory: No sensory deficit.     Motor: No weakness.  Psychiatric:        Mood and Affect: Mood normal.     ED Results / Procedures / Treatments   Labs (all labs ordered are listed, but only abnormal results are displayed) Labs Reviewed - No data to display  EKG None  Radiology US Venous Img Upper Right (DVT Study)  Result Date: 01/22/2023 CLINICAL DATA:  Pain and swelling EXAM: Right UPPER EXTREMITY VENOUS DOPPLER ULTRASOUND TECHNIQUE: Gray-scale sonography with graded compression, as well as color Doppler and duplex ultrasound were performed to evaluate the upper extremity deep venous system from the level of the subclavian vein and including the jugular, axillary, basilic, radial, ulnar and upper cephalic vein. Spectral Doppler was utilized to  evaluate flow at rest and with distal augmentation maneuvers. COMPARISON:  None Available. FINDINGS: Contralateral Subclavian Vein: Respiratory phasicity is normal and symmetric with the symptomatic side. No evidence of thrombus. Normal compressibility. Internal Jugular Vein: No evidence of thrombus. Normal compressibility, respiratory phasicity and response to augmentation. Subclavian Vein: No evidence of thrombus. Normal compressibility, respiratory phasicity and response to augmentation. Axillary Vein: No evidence of thrombus. Normal compressibility, respiratory phasicity and response to augmentation. Cephalic Vein: No evidence of thrombus. Normal compressibility, respiratory phasicity and response to augmentation. Basilic Vein: No  evidence of thrombus. Normal compressibility, respiratory phasicity and response to augmentation. Brachial Veins: No evidence of thrombus. Normal compressibility, respiratory phasicity and response to augmentation. Radial Veins: No evidence of thrombus. Normal compressibility, respiratory phasicity and response to augmentation. Ulnar Veins: No evidence of thrombus. Normal compressibility, respiratory phasicity and response to augmentation. Venous Reflux:  None visualized. Other Findings:  None visualized. IMPRESSION: No evidence of DVT within the right upper extremity. Electronically Signed   By: Ernie Avena M.D.   On: 01/22/2023 11:05    Procedures Procedures    Medications Ordered in ED Medications  acetaminophen (TYLENOL) tablet 650 mg (650 mg Oral Given 01/22/23 1029)    ED Course/ Medical Decision Making/ A&P                             Medical Decision Making Risk OTC drugs.   Jearlene Bridwell is here with right upper arm pain and swelling.  Normal vitals.  No fever.  History of hypertension high cholesterol.  Overall tender on exam.  No trauma history.  She sleeps often under her right arm.  She is neurovascular neuromuscular intact.  She is  got good pulses.  There is no obvious cellulitic changes.  Suspect muscular strain or bruising or soft tissue process otherwise I suspect that it is inflammatory process.  DVT study was obtained that was normal.  I have no concern for vascular process at this time.  Have no concern for cardiac or pulmonary process.  Recommend Tylenol and ibuprofen and rest.  Understands return precautions and recommend follow-up with primary care doctor.  Discharged in good condition.  This chart was dictated using voice recognition software.  Despite best efforts to proofread,  errors can occur which can change the documentation meaning.         Final Clinical Impression(s) / ED Diagnoses Final diagnoses:  Pain of right upper extremity    Rx / DC Orders ED Discharge Orders     None         Virgina Norfolk, DO 01/22/23 1125

## 2023-01-22 NOTE — ED Notes (Signed)
Patient is being discharged from the Urgent Care and sent to the Emergency Department via POV . Per Cheri Rous, NP, patient is in need of higher level of care due to arm swelling and rule out blood clot. Patient is aware and verbalizes understanding of plan of care.  Vitals:   01/22/23 0927  BP: 125/80  Pulse: 83  Resp: 16  Temp: 98.1 F (36.7 C)  SpO2: 98%

## 2023-01-22 NOTE — Discharge Instructions (Signed)
Recommend Tylenol and ibuprofen for pain.  Please return if symptoms worsen.  Overall I suspect you have a muscular/soft tissue process that should self resolve.  I have no concern for infectious process or blood clot at this time.

## 2023-01-22 NOTE — Discharge Instructions (Signed)
Please go to the ER for further evaluation of your symptoms  

## 2023-01-22 NOTE — ED Triage Notes (Signed)
Pt presents to UC w/ c/o right upper arm swelling since yesterday. Pt states it has felt numb x2 weeks. Denies injury or falling. Full ROM present.

## 2023-01-26 ENCOUNTER — Encounter: Payer: Self-pay | Admitting: Emergency Medicine

## 2023-01-26 ENCOUNTER — Ambulatory Visit (INDEPENDENT_AMBULATORY_CARE_PROVIDER_SITE_OTHER): Admitting: Emergency Medicine

## 2023-01-26 VITALS — BP 132/74 | HR 90 | Temp 98.3°F | Ht 67.0 in | Wt 217.4 lb

## 2023-01-26 DIAGNOSIS — Z130 Encounter for screening for diseases of the blood and blood-forming organs and certain disorders involving the immune mechanism: Secondary | ICD-10-CM

## 2023-01-26 DIAGNOSIS — E1159 Type 2 diabetes mellitus with other circulatory complications: Secondary | ICD-10-CM

## 2023-01-26 DIAGNOSIS — F17200 Nicotine dependence, unspecified, uncomplicated: Secondary | ICD-10-CM | POA: Diagnosis not present

## 2023-01-26 DIAGNOSIS — F32A Depression, unspecified: Secondary | ICD-10-CM

## 2023-01-26 DIAGNOSIS — Z114 Encounter for screening for human immunodeficiency virus [HIV]: Secondary | ICD-10-CM

## 2023-01-26 DIAGNOSIS — E6609 Other obesity due to excess calories: Secondary | ICD-10-CM

## 2023-01-26 DIAGNOSIS — E1169 Type 2 diabetes mellitus with other specified complication: Secondary | ICD-10-CM

## 2023-01-26 DIAGNOSIS — Z6833 Body mass index (BMI) 33.0-33.9, adult: Secondary | ICD-10-CM

## 2023-01-26 DIAGNOSIS — Z1211 Encounter for screening for malignant neoplasm of colon: Secondary | ICD-10-CM

## 2023-01-26 DIAGNOSIS — Z0001 Encounter for general adult medical examination with abnormal findings: Secondary | ICD-10-CM | POA: Diagnosis not present

## 2023-01-26 DIAGNOSIS — E785 Hyperlipidemia, unspecified: Secondary | ICD-10-CM | POA: Diagnosis not present

## 2023-01-26 DIAGNOSIS — I152 Hypertension secondary to endocrine disorders: Secondary | ICD-10-CM | POA: Diagnosis not present

## 2023-01-26 DIAGNOSIS — Z13220 Encounter for screening for lipoid disorders: Secondary | ICD-10-CM

## 2023-01-26 DIAGNOSIS — F419 Anxiety disorder, unspecified: Secondary | ICD-10-CM

## 2023-01-26 DIAGNOSIS — Z1329 Encounter for screening for other suspected endocrine disorder: Secondary | ICD-10-CM

## 2023-01-26 LAB — CBC WITH DIFFERENTIAL/PLATELET
Basophils Absolute: 0 10*3/uL (ref 0.0–0.1)
Basophils Relative: 0.5 % (ref 0.0–3.0)
Eosinophils Absolute: 0.1 10*3/uL (ref 0.0–0.7)
Eosinophils Relative: 2.2 % (ref 0.0–5.0)
HCT: 36.9 % (ref 36.0–46.0)
Hemoglobin: 11.8 g/dL — ABNORMAL LOW (ref 12.0–15.0)
Lymphocytes Relative: 34.5 % (ref 12.0–46.0)
Lymphs Abs: 2.2 10*3/uL (ref 0.7–4.0)
MCHC: 31.9 g/dL (ref 30.0–36.0)
MCV: 85.8 fl (ref 78.0–100.0)
Monocytes Absolute: 0.4 10*3/uL (ref 0.1–1.0)
Monocytes Relative: 6.1 % (ref 3.0–12.0)
Neutro Abs: 3.7 10*3/uL (ref 1.4–7.7)
Neutrophils Relative %: 56.7 % (ref 43.0–77.0)
Platelets: 353 10*3/uL (ref 150.0–400.0)
RBC: 4.3 Mil/uL (ref 3.87–5.11)
RDW: 15.9 % — ABNORMAL HIGH (ref 11.5–15.5)
WBC: 6.4 10*3/uL (ref 4.0–10.5)

## 2023-01-26 LAB — COMPREHENSIVE METABOLIC PANEL
ALT: 11 U/L (ref 0–35)
AST: 12 U/L (ref 0–37)
Albumin: 3.9 g/dL (ref 3.5–5.2)
Alkaline Phosphatase: 81 U/L (ref 39–117)
BUN: 11 mg/dL (ref 6–23)
CO2: 27 mEq/L (ref 19–32)
Calcium: 9.3 mg/dL (ref 8.4–10.5)
Chloride: 104 mEq/L (ref 96–112)
Creatinine, Ser: 0.87 mg/dL (ref 0.40–1.20)
GFR: 71.49 mL/min (ref >60.00)
Glucose, Bld: 177 mg/dL — ABNORMAL HIGH (ref 70–99)
Potassium: 4.1 mEq/L (ref 3.5–5.1)
Sodium: 139 mEq/L (ref 135–145)
Total Bilirubin: 0.3 mg/dL (ref 0.2–1.2)
Total Protein: 7 g/dL (ref 6.0–8.3)

## 2023-01-26 LAB — LIPID PANEL
Cholesterol: 113 mg/dL (ref 0–200)
HDL: 59.2 mg/dL (ref >39.00)
LDL Cholesterol: 37 mg/dL (ref 0–99)
NonHDL: 53.3
Total CHOL/HDL Ratio: 2
Triglycerides: 80 mg/dL (ref 0.0–149.0)
VLDL: 16 mg/dL (ref 0.0–40.0)

## 2023-01-26 LAB — POCT GLYCOSYLATED HEMOGLOBIN (HGB A1C): Hemoglobin A1C: 7.2 % — AB (ref 4.0–5.6)

## 2023-01-26 MED ORDER — GLIMEPIRIDE 2 MG PO TABS
ORAL_TABLET | ORAL | 1 refills | Status: DC
Start: 2023-01-26 — End: 2023-06-22

## 2023-01-26 NOTE — Assessment & Plan Note (Signed)
BP Readings from Last 3 Encounters:  01/26/23 132/74  01/22/23 126/75  01/22/23 125/80  Well-controlled hypertension. Continue amlodipine 10 mg and Hyzaar 100-12.5 mg daily Uncontrolled diabetes with hemoglobin A1c higher than before at 7.2. Continue Mounjaro 5 mg weekly and glimepiride 2 mg daily Diet and nutrition discussed Cardiovascular risks associated with uncontrolled diabetes discussed Recommend follow-up in 6 months

## 2023-01-26 NOTE — Assessment & Plan Note (Signed)
Continues to gain weight Benefits of exercise discussed Advised to decrease amount of daily carbohydrate intake and daily calories and increase amount of plant-based protein in her diet

## 2023-01-26 NOTE — Assessment & Plan Note (Signed)
Chronic stable condition. Continue atorvastatin 10 mg daily Diet and nutrition discussed. Lipid profile done today.

## 2023-01-26 NOTE — Progress Notes (Signed)
Stephanie Joseph 62 y.o.   Chief Complaint  Patient presents with   Annual Exam    No concerns     HISTORY OF PRESENT ILLNESS: This is a 62 y.o. female here for annual exam and follow-up on chronic medical conditions including hypertension and diabetes Lots of stress in her household. Not eating better. States he is compliant with medications No other complaints or medical concerns today.  HPI   Prior to Admission medications   Medication Sig Start Date End Date Taking? Authorizing Provider  amLODipine (NORVASC) 10 MG tablet TAKE ONE TABLET BY MOUTH EVERY DAY 06/23/22  Yes Ariaunna Longsworth, Eilleen Kempf, MD  atorvastatin (LIPITOR) 10 MG tablet TAKE ONE TABLET BY MOUTH EVERY DAY 06/01/22  Yes Tnya Ades, Eilleen Kempf, MD  busPIRone (BUSPAR) 7.5 MG tablet Take 1 tablet (7.5 mg total) by mouth 2 (two) times daily. 10/27/22  Yes Emmajane Altamura, Eilleen Kempf, MD  estrogen, conjugated,-medroxyprogesterone (PREMPRO) 0.3-1.5 MG tablet Take 1 tablet by mouth daily. 10/29/22  Yes Warden Fillers, MD  glimepiride (AMARYL) 2 MG tablet TAKE ONE TABLET BY MOUTH EVERY DAY WITH BREAKFAST 06/23/22  Yes Epsie Walthall, Eilleen Kempf, MD  hydrOXYzine (ATARAX) 25 MG tablet Take 1 tablet (25 mg total) by mouth every 8 (eight) hours as needed for itching. 04/01/22  Yes Judy Pollman, Eilleen Kempf, MD  Lancets (ACCU-CHEK SOFT TOUCH) lancets Use as instructed 05/21/17  Yes Dragnev, Alphonse Guild, NP  losartan-hydrochlorothiazide (HYZAAR) 100-12.5 MG tablet Take 1 tablet by mouth daily. 09/15/22  Yes Yanelie Abraha, Eilleen Kempf, MD  Misc. Devices (CANE) MISC Use as needed for left knee pain. 01/29/18  Yes Dragnev, Alphonse Guild, NP  naproxen (NAPROSYN) 375 MG tablet Take 1 tablet (375 mg total) by mouth 2 (two) times daily as needed for moderate pain. 10/28/22  Yes Louann Sjogren, DPM  QUEtiapine Fumarate (SEROQUEL XR) 150 MG 24 hr tablet Take 1 tablet (150 mg total) by mouth at bedtime. 10/10/21  Yes Penn, Cicely, NP  tirzepatide  [facility]) 5 MG/0.5ML Pen Inject 5 mg into the skin once a week. 11/25/22  Yes Georgina Quint, MD  venlafaxine (EFFEXOR) 75 MG tablet Take 1 tablet (75 mg total) by mouth 3 (three) times daily with meals. 09/26/21  Yes Penn, Cranston Neighbor, NP  albuterol (VENTOLIN HFA) 108 (90 Base) MCG/ACT inhaler Inhale 2 puffs into the lungs every 6 (six) hours as needed for wheezing or shortness of breath. Patient not taking: Reported on 01/26/2023 08/03/22   Guy Sandifer L, PA  azelastine (ASTELIN) 0.1 % nasal spray Place 1 spray into both nostrils 2 (two) times daily. Use in each nostril as directed 08/03/22   Margaretann Loveless, PA-C  benzonatate (TESSALON) 100 MG capsule Take 1 capsule (100 mg total) by mouth 3 (three) times daily as needed. 12/01/22   [name], FNP  brimonidine-timolol (COMBIGAN) 0.2-0.5 % ophthalmic solution Place 1 drop into both eyes every 12 (twelve) hours.    [provider]  fluticasone (FLONASE) 50 MCG/ACT nasal spray Place 2 sprays into both nostrils daily. Patient not taking: Reported on 01/26/2023 05/13/21   Eden Emms, NP  glucose blood (ACCU-CHEK AVIVA) test strip Use to check blood sugars 1-3 times daily. Dx Code: E11.9 01/20/18   Olive Bass, FNP  TRAVEL SICKNESS 25 MG CHEW CHEW ONE TABLET BY MOUTH THREE TIMES A DAY - AS NEEDED FOR DIZZINESS (TABLETS MAY BE CHEWED OR SWALLOWED WHOLE) 06/23/22   Georgina Quint, MD    No Known Allergies  Patient Active Problem  List   Diagnosis Date Noted   Bilateral tinnitus 09/18/2022   Problem of both ears 09/18/2022   Chronic left hip pain 04/01/2022   Hot flashes due to menopause 09/24/2021   Dizziness 07/31/2021   Chronic midline low back pain 07/12/2018   Glaucoma 05/21/2017   Class 1 obesity due to excess calories with serious comorbidity and body mass index (BMI) of 33.0 to 33.9 in adult 09/19/2016   Vitamin D deficiency 09/19/2016   Dyslipidemia associated with type 2 diabetes mellitus (HCC)  07/10/2015   Hormone replacement therapy (postmenopausal) 10/04/2014   Mitral regurgitation 11/05/2012   Valvular heart disease 10/21/2011   Menorrhagia, premenopausal 09/01/2011   Anxiety and depression 03/04/2010   GOUT 12/16/2006   ANEMIA, IRON DEFICIENCY NOS 12/16/2006   Current smoker 12/16/2006   Hypertension associated with diabetes (HCC) 12/16/2006    Past Medical History:  Diagnosis Date   Anemia, iron deficiency    Arthritis    Depression    Diabetes mellitus without complication (HCC)    Dysmenorrhea    Gout    Heart valve problem    Hypertension    Menorrhagia    Mitral regurgitation    Pneumonia     Past Surgical History:  Procedure Laterality Date   HAMMER TOE SURGERY     KNEE ARTHROSCOPY     Left    Social History   Socioeconomic History   Marital status: Married    Spouse name: Not on file   Number of children: 1   Years of education: 12   Highest education level: Not on file  Occupational History   Occupation: Disability Pending  Tobacco Use   Smoking status: Every Day    Current packs/day: 0.50    Average packs/day: 0.5 packs/day for 7.0 years (3.5 ttl pk-yrs)    Types: Cigarettes   Smokeless tobacco: Never  Vaping Use   Vaping status: Never Used  Substance and Sexual Activity   Alcohol use: No   Drug use: Not Currently    Types: "Crack" cocaine   Sexual activity: Not Currently    Birth control/protection: None, Post-menopausal  Other Topics Concern   Not on file  Social History Narrative   Fun: Go to church   Denies abuse and feels safe at home.    Social Determinants of Health   Financial Resource Strain: Not on file  Food Insecurity: Not on file  Transportation Needs: Not on file  Physical Activity: Not on file  Stress: Not on file  Social Connections: Not on file  Intimate Partner Violence: Not on file    Family History  Problem Relation Age of Onset   Hypertension Mother    Stroke Paternal Grandmother    Heart  disease Sister    Heart disease Brother      Review of Systems  Constitutional: Negative.  Negative for chills and fever.  HENT: Negative.  Negative for congestion and sore throat.   Respiratory: Negative.  Negative for cough and shortness of breath.   Cardiovascular: Negative.  Negative for chest pain and palpitations.  Gastrointestinal:  Negative for abdominal pain, nausea and vomiting.  Genitourinary: Negative.  Negative for dysuria and hematuria.  Skin: Negative.  Negative for rash.  Neurological: Negative.  Negative for dizziness and headaches.  All other systems reviewed and are negative.   Vitals:   01/26/23 1003  BP: 132/74  Pulse: 90  Temp: 98.3 F (36.8 C)  SpO2: 96%    Physical Exam Vitals reviewed.  Constitutional:      Appearance: Normal appearance. She is obese.  HENT:     Head: Normocephalic.     Right Ear: Tympanic membrane, ear canal and external ear normal.     Left Ear: Tympanic membrane, ear canal and external ear normal.     Mouth/Throat:     Mouth: Mucous membranes are moist.     Pharynx: Oropharynx is clear.  Eyes:     Extraocular Movements: Extraocular movements intact.     Conjunctiva/sclera: Conjunctivae normal.     Pupils: Pupils are equal, round, and reactive to light.  Cardiovascular:     Rate and Rhythm: Normal rate and regular rhythm.     Pulses: Normal pulses.     Heart sounds: Normal heart sounds.  Pulmonary:     Effort: Pulmonary effort is normal.     Breath sounds: Normal breath sounds.  Abdominal:     Palpations: Abdomen is soft.     Tenderness: There is no abdominal tenderness.  Musculoskeletal:     Cervical back: No tenderness.  Lymphadenopathy:     Cervical: No cervical adenopathy.  Skin:    General: Skin is warm and dry.     Capillary Refill: Capillary refill takes less than 2 seconds.  Neurological:     General: No focal deficit present.     Mental Status: She is alert and oriented to person, place, and time.   Psychiatric:        Mood and Affect: Mood normal.        Behavior: Behavior normal.    Results for orders placed or performed in visit on 01/26/23 (from the past 24 hour(s))  POCT HgB A1C     Status: Abnormal   Collection Time: 01/26/23 11:16 AM  Result Value Ref Range   Hemoglobin A1C 7.2 (A) 4.0 - 5.6 %   HbA1c POC (<> result, manual entry)     HbA1c, POC (prediabetic range)     HbA1c, POC (controlled diabetic range)       ASSESSMENT & PLAN: Problem List Items Addressed This Visit       Cardiovascular and Mediastinum   Hypertension associated with diabetes (HCC)    BP Readings from Last 3 Encounters:  01/26/23 132/74  01/22/23 126/75  01/22/23 125/80  Well-controlled hypertension. Continue amlodipine 10 mg and Hyzaar 100-12.5 mg daily Uncontrolled diabetes with hemoglobin A1c higher than before at 7.2. Continue Mounjaro 5 mg weekly and glimepiride 2 mg daily Diet and nutrition discussed Cardiovascular risks associated with uncontrolled diabetes discussed Recommend follow-up in 6 months       Relevant Medications   glimepiride (AMARYL) 2 MG tablet   Other Relevant Orders   POCT HgB A1C   Comprehensive metabolic panel     Endocrine   Dyslipidemia associated with type 2 diabetes mellitus (HCC)    Chronic stable condition Continue atorvastatin 10 mg daily Diet and nutrition discussed Lipid profile done today      Relevant Medications   glimepiride (AMARYL) 2 MG tablet   Other Relevant Orders   Comprehensive metabolic panel   Lipid panel     Other   Current smoker    Cardiovascular and cancer risk associated with smoking discussed Smoking cessation advice given.      Anxiety and depression    Known triggers. Continues BuSpar 7.5 mg twice a day along with Effexor 75 mg daily      Class 1 obesity due to excess calories with serious comorbidity and body mass index (  BMI) of 33.0 to 33.9 in adult    Continues to gain weight Benefits of exercise  discussed Advised to decrease amount of daily carbohydrate intake and daily calories and increase amount of plant-based protein in her diet      Relevant Medications   glimepiride (AMARYL) 2 MG tablet   Other Visit Diagnoses     Encounter for general adult medical examination with abnormal findings    -  Primary   Relevant Orders   CBC with Differential   Comprehensive metabolic panel   Lipid panel   HIV antibody   Encounter for screening for HIV       Relevant Orders   HIV antibody   Screening for deficiency anemia       Relevant Orders   CBC with Differential   Screening for lipoid disorders       Relevant Orders   Lipid panel   Screening for endocrine, metabolic and immunity disorder       Relevant Orders   Comprehensive metabolic panel      Modifiable risk factors discussed with patient. Anticipatory guidance according to age provided. The following topics were also discussed: Social Determinants of Health Smoking and cardiovascular risks associated with smoking.  Smoking cessation advice given Diet and nutrition and need to decrease amount of daily carbohydrate intake and daily calories and increase amount of plant-based protein in her diet Benefits of exercise Cancer screening and need for colon cancer screening.  Recommend Cologuard test Vaccinations reviewed and recommendations Cardiovascular risk assessment and need for blood work Review of multiple chronic medical conditions under management Review of all medications Mental health including depression and anxiety Fall and accident prevention  Patient Instructions  Health Maintenance, Female Adopting a healthy lifestyle and getting preventive care are important in promoting health and wellness. Ask your health care provider about: The right schedule for you to have regular tests and exams. Things you can do on your own to prevent diseases and keep yourself healthy. What should I know about diet, weight, and  exercise? Eat a healthy diet  Eat a diet that includes plenty of vegetables, fruits, low-fat dairy products, and lean protein. Do not eat a lot of foods that are high in solid fats, added sugars, or sodium. Maintain a healthy weight Body mass index (BMI) is used to identify weight problems. It estimates body fat based on height and weight. Your health care provider can help determine your BMI and help you achieve or maintain a healthy weight. Get regular exercise Get regular exercise. This is one of the most important things you can do for your health. Most adults should: Exercise for at least 150 minutes each week. The exercise should increase your heart rate and make you sweat (moderate-intensity exercise). Do strengthening exercises at least twice a week. This is in addition to the moderate-intensity exercise. Spend less time sitting. Even light physical activity can be beneficial. Watch cholesterol and blood lipids Have your blood tested for lipids and cholesterol at 62 years of age, then have this test every 5 years. Have your cholesterol levels checked more often if: Your lipid or cholesterol levels are high. You are older than 62 years of age. You are at high risk for heart disease. What should I know about cancer screening? Depending on your health history and family history, you may need to have cancer screening at various ages. This may include screening for: Breast cancer. Cervical cancer. Colorectal cancer. Skin cancer. Lung cancer. What should I  know about heart disease, diabetes, and high blood pressure? Blood pressure and heart disease High blood pressure causes heart disease and increases the risk of stroke. This is more likely to develop in people who have high blood pressure readings or are overweight. Have your blood pressure checked: Every 3-5 years if you are 68-48 years of age. Every year if you are 7 years old or older. Diabetes Have regular diabetes  screenings. This checks your fasting blood sugar level. Have the screening done: Once every three years after age 64 if you are at a normal weight and have a low risk for diabetes. More often and at a younger age if you are overweight or have a high risk for diabetes. What should I know about preventing infection? Hepatitis B If you have a higher risk for hepatitis B, you should be screened for this virus. Talk with your health care provider to find out if you are at risk for hepatitis B infection. Hepatitis C Testing is recommended for: Everyone born from [birth year] through 1965. Anyone with known risk factors for hepatitis C. Sexually transmitted infections (STIs) Get screened for STIs, including gonorrhea and chlamydia, if: You are sexually active and are younger than 62 years of age. You are older than 62 years of age and your health care provider tells you that you are at risk for this type of infection. Your sexual activity has changed since you were last screened, and you are at increased risk for chlamydia or gonorrhea. Ask your health care provider if you are at risk. Ask your health care provider about whether you are at high risk for HIV. Your health care provider may recommend a prescription medicine to help prevent HIV infection. If you choose to take medicine to prevent HIV, you should first get tested for HIV. You should then be tested every 3 months for as long as you are taking the medicine. Pregnancy If you are about to stop having your period (premenopausal) and you may become pregnant, seek counseling before you get pregnant. Take 400 to 800 micrograms (mcg) of folic acid every day if you become pregnant. Ask for birth control (contraception) if you want to prevent pregnancy. Osteoporosis and menopause Osteoporosis is a disease in which the bones lose minerals and strength with aging. This can result in bone fractures. If you are 70 years old or older, or if you are at risk for  osteoporosis and fractures, ask your health care provider if you should: Be screened for bone loss. Take a calcium or vitamin D supplement to lower your risk of fractures. Be given hormone replacement therapy (HRT) to treat symptoms of menopause. Follow these instructions at home: Alcohol use Do not drink alcohol if: Your health care provider tells you not to drink. You are pregnant, may be pregnant, or are planning to become pregnant. If you drink alcohol: Limit how much you have to: 0-1 drink a day. Know how much alcohol is in your drink. In the [place] one drink equals one 12 oz bottle of beer (355 mL), one 5 oz glass of wine (148 mL), or one 1 oz glass of hard liquor (44 mL). Lifestyle Do not use any products that contain nicotine or tobacco. These products include cigarettes, chewing tobacco, and vaping devices, such as e-cigarettes. If you need help quitting, ask your health care provider. Do not use street drugs. Do not share needles. Ask your health care provider for help if you need support or information about quitting drugs.  General instructions Schedule regular health, dental, and eye exams. Stay current with your vaccines. Tell your health care provider if: You often feel depressed. You have ever been abused or do not feel safe at home. Summary Adopting a healthy lifestyle and getting preventive care are important in promoting health and wellness. Follow your health care provider's instructions about healthy diet, exercising, and getting tested or screened for diseases. Follow your health care provider's instructions on monitoring your cholesterol and blood pressure. This information is not intended to replace advice given to you by your health care provider. Make sure you discuss any questions you have with your health care provider. Document Revised: 11/12/2020 Document Reviewed: 11/12/2020 Elsevier Patient Education  2024 Elsevier Inc.      Edwina Barth,  MD [facility]

## 2023-01-26 NOTE — Patient Instructions (Signed)

## 2023-01-26 NOTE — Assessment & Plan Note (Signed)
Cardiovascular and cancer risk associated with smoking discussed. Smoking cessation advice given. 

## 2023-01-26 NOTE — Assessment & Plan Note (Signed)
Known triggers. Continues BuSpar 7.5 mg twice a day along with Effexor 75 mg daily

## 2023-01-27 LAB — HIV ANTIBODY (ROUTINE TESTING W REFLEX): HIV 1&2 Ab, 4th Generation: NONREACTIVE

## 2023-02-09 NOTE — Telephone Encounter (Signed)
VOB initiated for Zilretta for RIGHT knee OA.  

## 2023-02-10 NOTE — Telephone Encounter (Signed)
Holding until needed by patient.

## 2023-02-10 NOTE — Telephone Encounter (Signed)
No Prior Auth required

## 2023-02-10 NOTE — Telephone Encounter (Signed)
Zilretta for RIGHT knee OA OK to schedule on or after 03/05/23  Primary Insurance: [employer] Co-pay: $50 Co-insurance: 25% Deductible: $50 of $50 met Prior Auth NOT required   Knee Injection History:  09/16/22 - Kenalog BILAT 12/10/22 - Zilretta RIGHT

## 2023-03-12 ENCOUNTER — Other Ambulatory Visit: Payer: Self-pay

## 2023-03-12 ENCOUNTER — Ambulatory Visit (INDEPENDENT_AMBULATORY_CARE_PROVIDER_SITE_OTHER): Admitting: Family Medicine

## 2023-03-12 VITALS — BP 132/74 | HR 70 | Ht 67.0 in | Wt 219.0 lb

## 2023-03-12 DIAGNOSIS — M1712 Unilateral primary osteoarthritis, left knee: Secondary | ICD-10-CM | POA: Diagnosis not present

## 2023-03-12 DIAGNOSIS — M25562 Pain in left knee: Secondary | ICD-10-CM

## 2023-03-12 NOTE — Patient Instructions (Signed)
Thank you for coming in today.   You received an injection today. Seek immediate medical attention if the joint becomes red, extremely painful, or is oozing fluid.   Please use Voltaren gel (Generic Diclofenac Gel) up to 4x daily for pain as needed.  This is available over-the-counter as both the name brand Voltaren gel and the generic diclofenac gel.   Let me know how this goes.  Check back as needed

## 2023-03-12 NOTE — Progress Notes (Signed)
Rubin Payor, PhD, LAT, ATC acting as a scribe for Clementeen Graham, MD.  Lulla Partsch Sholes is a 62 y.o. female who presents to [facility] today for cont'd L knee pain. Pt was last seen by Dr. Denyse Amass on 12/10/22 and was given a R knee Zilretta injection.  Today, pt reports L knee started hurting about a wk ago. Pain is waking her up at night. Her husband has a large pick-up truck which also exacerbates her L knee pain. Pain transitioning to stand. Pt locates pain to the anterior-medial aspect and posteriorly.  L knee swelling: unsure Mechanical symptoms: yes- and "tightness" noted.  Dx imaging: 11/22/22 R knee MRI 09/16/22 R & L knee XR 07/13/20 L knee XR             12/21/17 R knee XR  Pertinent review of systems: No fevers or chills  Relevant historical information: Hypertension and diabetes   Exam:  BP 132/74   Pulse 70   Ht 5\' 7"  (1.702 m)   Wt 219 lb (99.3 kg)   LMP 02/13/2014 (Within Days)   SpO2 96%   BMI 34.30 kg/m  General: Well Developed, well nourished, and in no acute distress.   MSK: Left knee: Mild effusion.  Normal motion with crepitation.  Tender palpation medial and lateral joint line.    Lab and Radiology Results  Procedure: Real-time Ultrasound Guided Injection of left knee joint superior lateral patella space Device: Philips Affiniti 50G/GE Logiq Images permanently stored and available for review in PACS Verbal informed consent obtained.  Discussed risks and benefits of procedure. Warned about infection, bleeding, hyperglycemia damage to structures among others. Patient expresses understanding and agreement Time-out conducted.   Noted no overlying erythema, induration, or other signs of local infection.   Skin prepped in a sterile fashion.   Local anesthesia: Topical Ethyl chloride.   With sterile technique and under real time ultrasound guidance: 40 mg of Kenalog and 2 mL of Marcaine injected into knee joint. Fluid  seen entering the joint capsule.   Completed without difficulty   Pain immediately resolved suggesting accurate placement of the medication.   Advised to call if fevers/chills, erythema, induration, drainage, or persistent bleeding.   Images permanently stored and available for review in the ultrasound unit.  Impression: Technically successful ultrasound guided injection.  EXAM: LEFT KNEE 3 VIEWS   COMPARISON:  None Available.   FINDINGS: Mild tricompartmental degenerative changes. No fractures, dislocations, bony lesions, or joint effusion.   IMPRESSION: Mild tricompartmental degenerative changes.     Electronically Signed   By: Gerome Sam III M.D.   On: 09/16/2022 16:13 I, Clementeen Graham, personally (independently) visualized and performed the interpretation of the images attached in this note.      Assessment and Plan: 62 y.o. female with left knee pain.  Pain thought to be due to exacerbation of DJD.  Plan for steroid injection today.  If this does not work we could work on [employer].  She will be eligible for Zilretta injection in that right knee again anytime.  It was last performed 3 months ago.  We could authorize both the right and left knee if needed.   PDMP not reviewed this encounter. Orders Placed This Encounter  Procedures   [place] LIMITED JOINT SPACE STRUCTURES LOW LEFT(NO LINKED CHARGES)    Order Specific Question:   Reason for Exam (SYMPTOM  OR DIAGNOSIS REQUIRED)    Answer:   left knee pain  Order Specific Question:   Preferred imaging location?    Answer:    [facility]   No orders of the defined types were placed in this encounter.    Discussed warning signs or symptoms. Please see discharge instructions. Patient expresses understanding.   The above documentation has been reviewed and is accurate and complete Clementeen Graham, M.D.

## 2023-03-26 ENCOUNTER — Ambulatory Visit
Admission: EM | Admit: 2023-03-26 | Discharge: 2023-03-26 | Disposition: A | Discharge status: Home / Self Care | Attending: Internal Medicine | Admitting: Internal Medicine

## 2023-03-26 DIAGNOSIS — K047 Periapical abscess without sinus: Secondary | ICD-10-CM

## 2023-03-26 DIAGNOSIS — R519 Headache, unspecified: Secondary | ICD-10-CM | POA: Diagnosis not present

## 2023-03-26 MED ORDER — IBUPROFEN 800 MG PO TABS: 800.0000 mg | ORAL_TABLET | Freq: Three times a day (TID) | ORAL | 0 refills | Status: DC

## 2023-03-26 MED ORDER — AMOXICILLIN 875 MG PO TABS: 875.0000 mg | ORAL_TABLET | Freq: Two times a day (BID) | ORAL | 0 refills | Status: DC

## 2023-03-26 NOTE — Discharge Instructions (Signed)
Start amoxicillin to help with the dental infection. Use ibuprofen for pain, swelling, inflammation. Make sure you keep your dentist appointment.

## 2023-03-26 NOTE — ED Triage Notes (Signed)
Pt c/o bottom front dental pain x 2 days-dentist appt 9/27-no pain meds PTA-NAD-steady gait

## 2023-03-26 NOTE — ED Provider Notes (Signed)
[facility]  Note:  This document was prepared using Conservation officer, historic buildings and may include unintentional dictation errors.  MRN: [id number] DOB: Jan 03, 1961  Subjective:   Stephanie Joseph is a 62 y.o. female presenting for 2-day history of acute onset moderate to severe facial/dental pain with swelling of the bottom teeth.  She has a dentist appointment 04/03/2023.  Has not taken any medications for pain relief.  No current facility-administered medications for this encounter.  Current Outpatient Medications:    amLODipine (NORVASC) 10 MG tablet, TAKE ONE TABLET BY MOUTH EVERY DAY, Disp: 90 tablet, Rfl: 3   atorvastatin (LIPITOR) 10 MG tablet, TAKE ONE TABLET BY MOUTH EVERY DAY, Disp: 90 tablet, Rfl: 3   azelastine (ASTELIN) 0.1 % nasal spray, Place 1 spray into both nostrils 2 (two) times daily. Use in each nostril as directed, Disp: 30 mL, Rfl: 0   brimonidine-timolol (COMBIGAN) 0.2-0.5 % ophthalmic solution, Place 1 drop into both eyes every 12 (twelve) hours., Disp: , Rfl:    busPIRone (BUSPAR) 7.5 MG tablet, Take 1 tablet (7.5 mg total) by mouth 2 (two) times daily., Disp: 60 tablet, Rfl: 0   estrogen, conjugated,-medroxyprogesterone (PREMPRO) 0.3-1.5 MG tablet, Take 1 tablet by mouth daily., Disp: 30 tablet, Rfl: 8   fluticasone (FLONASE) 50 MCG/ACT nasal spray, Place 2 sprays into both nostrils daily. (Patient not taking: Reported on 01/26/2023), Disp: 16 g, Rfl: 6   glimepiride (AMARYL) 2 MG tablet, TAKE ONE TABLET BY MOUTH EVERY DAY WITH BREAKFAST., Disp: 90 tablet, Rfl: 1   glucose blood (ACCU-CHEK AVIVA) test strip, Use to check blood sugars 1-3 times daily. Dx Code: E11.9, Disp: 300 each, Rfl: 1   hydrOXYzine (ATARAX) 25 MG tablet, Take 1 tablet (25 mg total) by mouth every 8 (eight) hours as needed for itching., Disp: 360 tablet, Rfl: 1   Lancets (ACCU-CHEK SOFT TOUCH) lancets, Use as instructed, Disp: 100 each, Rfl: 12    losartan-hydrochlorothiazide (HYZAAR) 100-12.5 MG tablet, Take 1 tablet by mouth daily., Disp: 90 tablet, Rfl: 3   Misc. Devices (CANE) MISC, Use as needed for left knee pain., Disp: 1 each, Rfl: 0   naproxen (NAPROSYN) 375 MG tablet, Take 1 tablet (375 mg total) by mouth 2 (two) times daily as needed for moderate pain., Disp: 180 tablet, Rfl: 0   QUEtiapine Fumarate (SEROQUEL XR) 150 MG 24 hr tablet, Take 1 tablet (150 mg total) by mouth at bedtime., Disp: 30 tablet, Rfl: 2   tirzepatide (MOUNJARO) 5 MG/0.5ML Pen, Inject 5 mg into the skin once a week., Disp: 6 mL, Rfl: 3   TRAVEL SICKNESS 25 MG CHEW, CHEW ONE TABLET BY MOUTH THREE TIMES A DAY - AS NEEDED FOR DIZZINESS (TABLETS MAY BE CHEWED OR SWALLOWED WHOLE), Disp: 30 tablet, Rfl: 1   venlafaxine (EFFEXOR) 75 MG tablet, Take 1 tablet (75 mg total) by mouth 3 (three) times daily with meals., Disp: 270 tablet, Rfl: 1   No Known Allergies  Past Medical History:  Diagnosis Date   Anemia, iron deficiency    Arthritis    Depression    Diabetes mellitus without complication (HCC)    Dysmenorrhea    Gout    Heart valve problem    Hypertension    Menorrhagia    Mitral regurgitation    Pneumonia      Past Surgical History:  Procedure Laterality Date   HAMMER TOE SURGERY     KNEE ARTHROSCOPY     Left  Family History  Problem Relation Age of Onset   Hypertension Mother    Stroke Paternal Grandmother    Heart disease Sister    Heart disease Brother     Social History   Tobacco Use   Smoking status: Every Day    Current packs/day: 0.50    Average packs/day: 0.5 packs/day for 7.0 years (3.5 ttl pk-yrs)    Types: Cigarettes   Smokeless tobacco: Never  Vaping Use   Vaping status: Never Used  Substance Use Topics   Alcohol use: No   Drug use: Not Currently    Types: "Crack" cocaine    ROS   Objective:   Vitals: BP 128/82 (BP Location: Right Arm)   Pulse 72   Temp 98.5 F (36.9 C) (Oral)   Resp 20   LMP  02/13/2014 (Within Days)   SpO2 97%   Physical Exam Constitutional:      General: She is not in acute distress.    Appearance: Normal appearance. She is well-developed. She is not ill-appearing, toxic-appearing or diaphoretic.  HENT:     Head: Normocephalic and atraumatic.     Nose: Nose normal.     Mouth/Throat:     Mouth: Mucous membranes are moist.     Pharynx: No pharyngeal swelling, oropharyngeal exudate, posterior oropharyngeal erythema or uvula swelling.     Tonsils: No tonsillar exudate or tonsillar abscesses. 0 on the right. 0 on the left.      Comments: Multiple missing molars. Eyes:     General: No scleral icterus.       Right eye: No discharge.        Left eye: No discharge.     Extraocular Movements: Extraocular movements intact.  Cardiovascular:     Rate and Rhythm: Normal rate.  Pulmonary:     Effort: Pulmonary effort is normal.  Skin:    General: Skin is warm and dry.  Neurological:     General: No focal deficit present.     Mental Status: She is alert and oriented to person, place, and time.  Psychiatric:        Mood and Affect: Mood normal.        Behavior: Behavior normal.     Assessment and Plan :   PDMP not reviewed this encounter.  1. Facial pain   2. Dental abscess    Start amoxicillin for dental infection/abscess, use ibuprofen for pain and inflammation. Emphasized need for dental surgeon consult. Counseled patient on potential for adverse effects with medications prescribed/recommended today, strict ER and return-to-clinic precautions discussed, patient verbalized understanding.    Wallis Bamberg, [place] 03/26/23 1127

## 2023-06-19 ENCOUNTER — Telehealth: Payer: Self-pay | Admitting: Emergency Medicine

## 2023-06-19 NOTE — Telephone Encounter (Signed)
Prescription Request  06/19/2023  LOV: 01/26/2023  What is the name of the medication or equipment? atorvastatin  glimepiride (AMARYL) 2 MG tablet (LIPITOR) 10 MG tablet   Have you contacted your pharmacy to request a refill? No   Which pharmacy would you like this sent to?    [facility] - [place], [place] - [address] [facility] [address] [place] [address] Phone: [phone] Fax: [phone]   Patient notified that their request is being sent to the clinical staff for review and that they should receive a response within 2 business days.   Please advise at Mobile [phone] (mobile)

## 2023-06-22 ENCOUNTER — Other Ambulatory Visit: Payer: Self-pay | Admitting: Radiology

## 2023-06-22 DIAGNOSIS — E119 Type 2 diabetes mellitus without complications: Secondary | ICD-10-CM

## 2023-06-22 DIAGNOSIS — I152 Hypertension secondary to endocrine disorders: Secondary | ICD-10-CM

## 2023-06-22 MED ORDER — GLIMEPIRIDE 2 MG PO TABS
ORAL_TABLET | ORAL | 1 refills | Status: AC
Start: 2023-06-22 — End: ?

## 2023-06-22 MED ORDER — ATORVASTATIN CALCIUM 10 MG PO TABS: 10.0000 mg | ORAL_TABLET | Freq: Every day | ORAL | 3 refills | Status: AC

## 2023-07-09 NOTE — Telephone Encounter (Signed)
 VOB initiated for 2025 coverage of Zilretta.

## 2023-07-13 ENCOUNTER — Ambulatory Visit: Admitting: Emergency Medicine

## 2023-07-14 NOTE — Telephone Encounter (Signed)
 [employer] - Prior Authorization NOT required  [employer] - Prior Authorization NOT required

## 2023-07-14 NOTE — Telephone Encounter (Signed)
 ZILRETTA  for RIGHT knee OA   Primary Insurance: [employer] Co-pay: n/a Co-insurance: 25% Deductible: $0 of $50 met - must be met for coverage to apply Prior Auth: NOT required   Knee Injection History 09/16/22 - Kenalog  BILAT 12/10/22 - Zilretta  RIGHT 03/12/23 - Kenalog  LEFT

## 2023-07-21 ENCOUNTER — Ambulatory Visit: Admitting: Emergency Medicine

## 2023-08-06 ENCOUNTER — Other Ambulatory Visit: Payer: Self-pay | Admitting: Emergency Medicine

## 2023-08-06 DIAGNOSIS — Z1231 Encounter for screening mammogram for malignant neoplasm of breast: Secondary | ICD-10-CM

## 2023-08-07 NOTE — Progress Notes (Unsigned)
Stephanie Joseph D.[facility] [address] [place] [address] Phone: [phone]   Assessment and Plan:    1. Primary osteoarthritis of both knees 2. Chronic pain of both knees  -Chronic with exacerbation, subsequent visit - Consistent with flare of bilateral knee osteoarthritis - Patient has been receiving about 3 months relief from CSI and Zilretta injections.  Elects for bilateral knee CSI today.  Tolerated well per note below.  CSI may temporarily increase blood glucose in patient with past medical history of DM type II - May use Tylenol for day-to-day pain relief  Procedure: Knee Joint Injection Side: Bilateral Indication: Flare of osteoarthritis  Risks explained and consent was given verbally. The site was cleaned with alcohol prep. A needle was introduced with an anterio-lateral approach. Injection given using 2mL of 1% lidocaine without epinephrine and 1mL of kenalog 40mg /ml. This was well tolerated and resulted in symptomatic relief.  Needle was removed, hemostasis achieved, and post injection instructions were explained.  Procedure was repeated on contralateral side.  Pt was advised to call or return to clinic if these symptoms worsen or fail to improve as anticipated.   Pertinent previous records reviewed include none  Follow Up: As needed for recurrent flare of knee pain.  Could consider additional CSI versus Zilretta injections.  Zilretta injections would need reauthorized   Subjective:   I, Stephanie Joseph, am serving as a Neurosurgeon for Doctor Stephanie Joseph  Chief Complaint: bilateral knee pain   HPI:   08/10/2023 Patient is a 63 year old female with bilat knee pain. Patient states her knees are okay right at the moment. But they have bothering her for a while. States had to use a heating pad recently to help with the pain. Usually sees Dr. Denyse Joseph and gets injections.    Relevant Historical Information: DM type II,  hypertension  Additional pertinent review of systems negative.   Current Outpatient Medications:    amLODipine (NORVASC) 10 MG tablet, TAKE ONE TABLET BY MOUTH EVERY DAY, Disp: 90 tablet, Rfl: 3   atorvastatin (LIPITOR) 10 MG tablet, Take 1 tablet (10 mg total) by mouth daily., Disp: 90 tablet, Rfl: 3   azelastine (ASTELIN) 0.1 % nasal spray, Place 1 spray into both nostrils 2 (two) times daily. Use in each nostril as directed, Disp: 30 mL, Rfl: 0   brimonidine-timolol (COMBIGAN) 0.2-0.5 % ophthalmic solution, Place 1 drop into both eyes every 12 (twelve) hours., Disp: , Rfl:    busPIRone (BUSPAR) 7.5 MG tablet, Take 1 tablet (7.5 mg total) by mouth 2 (two) times daily., Disp: 60 tablet, Rfl: 0   estrogen, conjugated,-medroxyprogesterone (PREMPRO) 0.3-1.5 MG tablet, Take 1 tablet by mouth daily., Disp: 30 tablet, Rfl: 8   fluticasone (FLONASE) 50 MCG/ACT nasal spray, Place 2 sprays into both nostrils daily., Disp: 16 g, Rfl: 6   glimepiride (AMARYL) 2 MG tablet, TAKE ONE TABLET BY MOUTH EVERY DAY WITH BREAKFAST., Disp: 90 tablet, Rfl: 1   glucose blood (ACCU-CHEK AVIVA) test strip, Use to check blood sugars 1-3 times daily. Dx Code: E11.9, Disp: 300 each, Rfl: 1   hydrOXYzine (ATARAX) 25 MG tablet, Take 1 tablet (25 mg total) by mouth every 8 (eight) hours as needed for itching., Disp: 360 tablet, Rfl: 1   ibuprofen (ADVIL) 800 MG tablet, Take 1 tablet (800 mg total) by mouth 3 (three) times daily., Disp: 21 tablet, Rfl: 0   Lancets (ACCU-CHEK SOFT TOUCH) lancets, Use as instructed, Disp: 100 each, Rfl:  12   losartan-hydrochlorothiazide (HYZAAR) 100-12.5 MG tablet, Take 1 tablet by mouth daily., Disp: 90 tablet, Rfl: 3   Misc. Devices (CANE) MISC, Use as needed for left knee pain., Disp: 1 each, Rfl: 0   QUEtiapine Fumarate (SEROQUEL XR) 150 MG 24 hr tablet, Take 1 tablet (150 mg total) by mouth at bedtime., Disp: 30 tablet, Rfl: 2   tirzepatide (MOUNJARO) 5 MG/0.5ML Pen, Inject 5 mg into the  skin once a week., Disp: 6 mL, Rfl: 3   TRAVEL SICKNESS 25 MG CHEW, CHEW ONE TABLET BY MOUTH THREE TIMES A DAY - AS NEEDED FOR DIZZINESS (TABLETS MAY BE CHEWED OR SWALLOWED WHOLE), Disp: 30 tablet, Rfl: 1   venlafaxine (EFFEXOR) 75 MG tablet, Take 1 tablet (75 mg total) by mouth 3 (three) times daily with meals., Disp: 270 tablet, Rfl: 1   Objective:     Vitals:   08/10/23 1020  BP: 110/82  Pulse: 91  SpO2: 99%  Weight: 216 lb (98 kg)  Height: 5' 8.4" (1.737 m)      Body mass index is 32.46 kg/m.    Physical Exam:    General:  awake, alert oriented, no acute distress nontoxic Skin: no suspicious lesions or rashes Neuro:sensation intact and strength 5/5 with no deficits, no atrophy, normal muscle tone Psych: No signs of anxiety, depression or other mood disorder  Bilateral knee: Crepitus present No swelling No deformity Neg fluid wave, joint milking ROM Flex 100, Ext 5 TTP medial and lateral joint line NTTP over the quad tendon, medial fem condyle, lat fem condyle, patella, plica, patella tendon, tibial tuberostiy, fibular head, posterior fossa, pes anserine bursa, gerdy's tubercle,    Gait normal    Electronically signed by:  Stephanie Joseph D.[facility] 10:31 AM 08/10/23

## 2023-08-10 ENCOUNTER — Ambulatory Visit (INDEPENDENT_AMBULATORY_CARE_PROVIDER_SITE_OTHER): Admitting: Emergency Medicine

## 2023-08-10 ENCOUNTER — Encounter: Payer: Self-pay | Admitting: Emergency Medicine

## 2023-08-10 ENCOUNTER — Ambulatory Visit (INDEPENDENT_AMBULATORY_CARE_PROVIDER_SITE_OTHER): Admitting: Sports Medicine

## 2023-08-10 VITALS — BP 126/70 | HR 77 | Temp 98.2°F | Ht 68.4 in | Wt 216.0 lb

## 2023-08-10 VITALS — BP 110/82 | HR 91 | Ht 68.4 in | Wt 216.0 lb

## 2023-08-10 DIAGNOSIS — I38 Endocarditis, valve unspecified: Secondary | ICD-10-CM

## 2023-08-10 DIAGNOSIS — M25562 Pain in left knee: Secondary | ICD-10-CM | POA: Diagnosis not present

## 2023-08-10 DIAGNOSIS — F17200 Nicotine dependence, unspecified, uncomplicated: Secondary | ICD-10-CM

## 2023-08-10 DIAGNOSIS — F419 Anxiety disorder, unspecified: Secondary | ICD-10-CM | POA: Diagnosis not present

## 2023-08-10 DIAGNOSIS — G8929 Other chronic pain: Secondary | ICD-10-CM | POA: Diagnosis not present

## 2023-08-10 DIAGNOSIS — M170 Bilateral primary osteoarthritis of knee: Secondary | ICD-10-CM

## 2023-08-10 DIAGNOSIS — M25561 Pain in right knee: Secondary | ICD-10-CM

## 2023-08-10 DIAGNOSIS — E1169 Type 2 diabetes mellitus with other specified complication: Secondary | ICD-10-CM | POA: Diagnosis not present

## 2023-08-10 DIAGNOSIS — E785 Hyperlipidemia, unspecified: Secondary | ICD-10-CM

## 2023-08-10 DIAGNOSIS — E1159 Type 2 diabetes mellitus with other circulatory complications: Secondary | ICD-10-CM

## 2023-08-10 DIAGNOSIS — Z7984 Long term (current) use of oral hypoglycemic drugs: Secondary | ICD-10-CM

## 2023-08-10 DIAGNOSIS — F32A Depression, unspecified: Secondary | ICD-10-CM

## 2023-08-10 DIAGNOSIS — E66811 Obesity, class 1: Secondary | ICD-10-CM

## 2023-08-10 DIAGNOSIS — E6609 Other obesity due to excess calories: Secondary | ICD-10-CM

## 2023-08-10 DIAGNOSIS — I152 Hypertension secondary to endocrine disorders: Secondary | ICD-10-CM | POA: Diagnosis not present

## 2023-08-10 DIAGNOSIS — Z6833 Body mass index (BMI) 33.0-33.9, adult: Secondary | ICD-10-CM

## 2023-08-10 LAB — POCT GLYCOSYLATED HEMOGLOBIN (HGB A1C): Hemoglobin A1C: 7 % — AB (ref 4.0–5.6)

## 2023-08-10 NOTE — Patient Instructions (Signed)

## 2023-08-10 NOTE — Assessment & Plan Note (Signed)
Known triggers. Continues BuSpar 7.5 mg twice a day along with Effexor 75 mg daily

## 2023-08-10 NOTE — Assessment & Plan Note (Signed)
Cardiovascular and cancer risk associated with smoking discussed. Smoking cessation advice given. 

## 2023-08-10 NOTE — Assessment & Plan Note (Signed)
Well-controlled hypertension Continue amlodipine 10 mg daily and Hyzaar 100-12.5 mg daily Hemoglobin A1c today at 7.0, better than before Cardiovascular risks associated with hypertension and diabetes discussed Diet and nutrition discussed Recommend to continue Mounjaro 5 mg weekly and glimepiride 2 mg daily Follow-up in 3 months

## 2023-08-10 NOTE — Assessment & Plan Note (Signed)
 Chronic stable condition. Continue atorvastatin 10 mg daily Diet and nutrition discussed

## 2023-08-10 NOTE — Assessment & Plan Note (Signed)
Benefits of exercise discussed Advised to decrease amount of daily carbohydrate intake and daily calories and increase amount of plant based protein in her diet.

## 2023-08-10 NOTE — Assessment & Plan Note (Signed)
Stable.  Recent echocardiogram results reviewed.  Sees cardiologist on a regular basis.

## 2023-08-10 NOTE — Patient Instructions (Signed)
Good to see you  Injection in both knees today Can follow up with me or Dr. Denyse Amass as needed

## 2023-08-10 NOTE — Progress Notes (Signed)
Stephanie Joseph 63 y.o.   Chief Complaint  Patient presents with   Follow-up    6 month f/u for DM. No other concerns     HISTORY OF PRESENT ILLNESS: This is a 63 y.o. female here for 42-month follow-up of chronic medical conditions including diabetes and hypertension Overall doing well.  Has no complaints or medical concerns today. Lab Results  Component Value Date   HGBA1C 7.2 (A) 01/26/2023   BP Readings from Last 3 Encounters:  08/10/23 126/70  03/26/23 128/82  03/12/23 132/74   Wt Readings from Last 3 Encounters:  08/10/23 216 lb (98 kg)  03/12/23 219 lb (99.3 kg)  01/26/23 217 lb 6 oz (98.6 kg)     HPI   Prior to Admission medications   Medication Sig Start Date End Date Taking? Authorizing Provider  amLODipine (NORVASC) 10 MG tablet TAKE ONE TABLET BY MOUTH EVERY DAY 06/23/22  Yes Carvin Almas, Eilleen Kempf, MD  atorvastatin (LIPITOR) 10 MG tablet Take 1 tablet (10 mg total) by mouth daily. 06/22/23  Yes Kevan Prouty, Eilleen Kempf, MD  azelastine (ASTELIN) 0.1 % nasal spray Place 1 spray into both nostrils 2 (two) times daily. Use in each nostril as directed 08/03/22  Yes Burnette, Alessandra Bevels, PA-C  brimonidine-timolol (COMBIGAN) 0.2-0.5 % ophthalmic solution Place 1 drop into both eyes every 12 (twelve) hours.   Yes [provider]  busPIRone (BUSPAR) 7.5 MG tablet Take 1 tablet (7.5 mg total) by mouth 2 (two) times daily. 10/27/22  Yes Samaad Hashem, Eilleen Kempf, MD  estrogen, conjugated,-medroxyprogesterone (PREMPRO) 0.3-1.5 MG tablet Take 1 tablet by mouth daily. 10/29/22  Yes Warden Fillers, MD  fluticasone (FLONASE) 50 MCG/ACT nasal spray Place 2 sprays into both nostrils daily. 05/13/21  Yes Eden Emms, NP  glimepiride (AMARYL) 2 MG tablet TAKE ONE TABLET BY MOUTH EVERY DAY WITH BREAKFAST. 06/22/23  Yes Italia Wolfert, Eilleen Kempf, MD  glucose blood (ACCU-CHEK AVIVA) test strip Use to check blood sugars 1-3 times daily. Dx Code: E11.9 01/20/18  Yes Olive Bass, FNP  hydrOXYzine (ATARAX) 25 MG tablet Take 1 tablet (25 mg total) by mouth every 8 (eight) hours as needed for itching. 04/01/22  Yes Christana Angelica, Eilleen Kempf, MD  ibuprofen (ADVIL) 800 MG tablet Take 1 tablet (800 mg total) by mouth 3 (three) times daily. 03/26/23  Yes Wallis Bamberg, PA-C  Lancets (ACCU-CHEK SOFT TOUCH) lancets Use as instructed 05/21/17  Yes Dragnev, Alphonse Guild, NP  losartan-hydrochlorothiazide (HYZAAR) 100-12.5 MG tablet Take 1 tablet by mouth daily. 09/15/22  Yes Duwayne Matters, Eilleen Kempf, MD  Misc. Devices (CANE) MISC Use as needed for left knee pain. 01/29/18  Yes Dragnev, Alphonse Guild, NP  QUEtiapine Fumarate (SEROQUEL XR) 150 MG 24 hr tablet Take 1 tablet (150 mg total) by mouth at bedtime. 10/10/21  Yes Penn, Cicely, NP  tirzepatide [facility]) 5 MG/0.5ML Pen Inject 5 mg into the skin once a week. 11/25/22  Yes Rea Kalama, Eilleen Kempf, MD  TRAVEL SICKNESS 25 MG CHEW CHEW ONE TABLET BY MOUTH THREE TIMES A DAY - AS NEEDED FOR DIZZINESS (TABLETS MAY BE CHEWED OR SWALLOWED WHOLE) 06/23/22  Yes Georgina Quint, MD  venlafaxine (EFFEXOR) 75 MG tablet Take 1 tablet (75 mg total) by mouth 3 (three) times daily with meals. 09/26/21  Yes Penn, Cranston Neighbor, NP    No Known Allergies  Patient Active Problem List   Diagnosis Date Noted   Chronic left hip pain 04/01/2022   Hot flashes due to menopause 09/24/2021  Chronic midline low back pain 07/12/2018   Glaucoma 05/21/2017   Class 1 obesity due to excess calories with serious comorbidity and body mass index (BMI) of 33.0 to 33.9 in adult 09/19/2016   Vitamin D deficiency 09/19/2016   Dyslipidemia associated with type 2 diabetes mellitus (HCC) 07/10/2015   Hormone replacement therapy (postmenopausal) 10/04/2014   Mitral regurgitation 11/05/2012   Valvular heart disease 10/21/2011   Anxiety and depression 03/04/2010   GOUT 12/16/2006   ANEMIA, IRON DEFICIENCY NOS 12/16/2006   Current smoker 12/16/2006   Hypertension  associated with diabetes (HCC) 12/16/2006    Past Medical History:  Diagnosis Date   Anemia, iron deficiency    Arthritis    Depression    Diabetes mellitus without complication (HCC)    Dysmenorrhea    Gout    Heart valve problem    Hypertension    Menorrhagia    Mitral regurgitation    Pneumonia     Past Surgical History:  Procedure Laterality Date   HAMMER TOE SURGERY     KNEE ARTHROSCOPY     Left    Social History   Socioeconomic History   Marital status: Married    Spouse name: Not on file   Number of children: 1   Years of education: 12   Highest education level: Not on file  Occupational History   Occupation: Disability Pending  Tobacco Use   Smoking status: Every Day    Current packs/day: 0.50    Average packs/day: 0.5 packs/day for 7.0 years (3.5 ttl pk-yrs)    Types: Cigarettes   Smokeless tobacco: Never  Vaping Use   Vaping status: Never Used  Substance and Sexual Activity   Alcohol use: No   Drug use: Not Currently    Types: "Crack" cocaine   Sexual activity: Not Currently    Birth control/protection: None, Post-menopausal  Other Topics Concern   Not on file  Social History Narrative   Fun: Go to church   Denies abuse and feels safe at home.    Social Drivers of Corporate investment banker Strain: Not on file  Food Insecurity: Not on file  Transportation Needs: Not on file  Physical Activity: Not on file  Stress: Not on file  Social Connections: Not on file  Intimate Partner Violence: Not on file    Family History  Problem Relation Age of Onset   Hypertension Mother    Stroke Paternal Grandmother    Heart disease Sister    Heart disease Brother      Review of Systems  Constitutional: Negative.  Negative for chills and fever.  HENT: Negative.  Negative for congestion and sore throat.   Respiratory: Negative.  Negative for cough and shortness of breath.   Cardiovascular: Negative.  Negative for chest pain and palpitations.   Gastrointestinal:  Negative for abdominal pain, nausea and vomiting.  Genitourinary: Negative.  Negative for dysuria and hematuria.  Skin: Negative.  Negative for rash.  Neurological: Negative.  Negative for dizziness and headaches.  All other systems reviewed and are negative.   Vitals:   08/10/23 0942  BP: 126/70  Pulse: 77  Temp: 98.2 F (36.8 C)  SpO2: 98%    Physical Exam Vitals reviewed.  Constitutional:      Appearance: Normal appearance.  HENT:     Head: Normocephalic.     Mouth/Throat:     Mouth: Mucous membranes are moist.     Pharynx: Oropharynx is clear.  Eyes:  Extraocular Movements: Extraocular movements intact.     Conjunctiva/sclera: Conjunctivae normal.     Pupils: Pupils are equal, round, and reactive to light.  Cardiovascular:     Rate and Rhythm: Normal rate and regular rhythm.     Pulses: Normal pulses.     Heart sounds: Normal heart sounds.  Pulmonary:     Effort: Pulmonary effort is normal.     Breath sounds: Normal breath sounds.  Musculoskeletal:        General: Normal range of motion.     Cervical back: No tenderness.  Lymphadenopathy:     Cervical: No cervical adenopathy.  Skin:    General: Skin is warm and dry.  Neurological:     Mental Status: She is alert and oriented to person, place, and time.  Psychiatric:        Mood and Affect: Mood normal.        Behavior: Behavior normal.     Results for orders placed or performed in visit on 08/10/23 (from the past 24 hours)  POCT glycosylated hemoglobin (Hb A1C)     Status: Abnormal   Collection Time: 08/10/23  9:55 AM  Result Value Ref Range   Hemoglobin A1C 7.0 (A) 4.0 - 5.6 %   HbA1c POC (<> result, manual entry)     HbA1c, POC (prediabetic range)     HbA1c, POC (controlled diabetic range)      ASSESSMENT & PLAN: A total of 45 minutes was spent with the patient and counseling/coordination of care regarding preparing for this visit, review of most recent office visit notes,  review of multiple chronic medical conditions and their management, cardiovascular risks associated with hypertension and diabetes, review of all medications, review of most recent bloodwork results including interpretation of today's hemoglobin A1c, review of health maintenance items, education on nutrition, prognosis, documentation, and need for follow up.   Problem List Items Addressed This Visit       Cardiovascular and Mediastinum   Hypertension associated with diabetes (HCC) - Primary   Well-controlled hypertension Continue amlodipine 10 mg daily and Hyzaar 100-12.5 mg daily Hemoglobin A1c today at 7.0, better than before Cardiovascular risks associated with hypertension and diabetes discussed Diet and nutrition discussed Recommend to continue Mounjaro 5 mg weekly and glimepiride 2 mg daily Follow-up in 3 months      Relevant Orders   POCT glycosylated hemoglobin (Hb A1C) (Completed)   Valvular heart disease   Stable. Recent echocardiogram results reviewed. Sees cardiologist on a regular basis.         Endocrine   Dyslipidemia associated with type 2 diabetes mellitus (HCC)   Chronic stable condition Continue atorvastatin 10 mg daily Diet and nutrition discussed      Relevant Orders   POCT glycosylated hemoglobin (Hb A1C) (Completed)     Other   Current smoker   Cardiovascular and cancer risk associated with smoking discussed Smoking cessation advice given.      Anxiety and depression   Known triggers. Continues BuSpar 7.5 mg twice a day along with Effexor 75 mg daily      Class 1 obesity due to excess calories with serious comorbidity and body mass index (BMI) of 33.0 to 33.9 in adult   Benefits of exercise discussed Advised to decrease amount of daily carbohydrate intake and daily calories and increase amount of plant-based protein in her diet      Patient Instructions  Diabetes Mellitus and Nutrition, Adult When you have diabetes, or diabetes mellitus, it  is very important to have healthy eating habits because your blood sugar (glucose) levels are greatly affected by what you eat and drink. Eating healthy foods in the right amounts, at about the same times every day, can help you: Manage your blood glucose. Lower your risk of heart disease. Improve your blood pressure. Reach or maintain a healthy weight. What can affect my meal plan? Every person with diabetes is different, and each person has different needs for a meal plan. Your health care provider may recommend that you work with a dietitian to make a meal plan that is best for you. Your meal plan may vary depending on factors such as: The calories you need. The medicines you take. Your weight. Your blood glucose, blood pressure, and cholesterol levels. Your activity level. Other health conditions you have, such as heart or kidney disease. How do carbohydrates affect me? Carbohydrates, also called carbs, affect your blood glucose level more than any other type of food. Eating carbs raises the amount of glucose in your blood. It is important to know how many carbs you can safely have in each meal. This is different for every person. Your dietitian can help you calculate how many carbs you should have at each meal and for each snack. How does alcohol affect me? Alcohol can cause a decrease in blood glucose (hypoglycemia), especially if you use insulin or take certain diabetes medicines by mouth. Hypoglycemia can be a life-threatening condition. Symptoms of hypoglycemia, such as sleepiness, dizziness, and confusion, are similar to symptoms of having too much alcohol. Do not drink alcohol if: Your health care provider tells you not to drink. You are pregnant, may be pregnant, or are planning to become pregnant. If you drink alcohol: Limit how much you have to: 0-1 drink a day for women. 0-2 drinks a day for men. Know how much alcohol is in your drink. In the [place] one drink equals one 12 oz  bottle of beer (355 mL), one 5 oz glass of wine (148 mL), or one 1 oz glass of hard liquor (44 mL). Keep yourself hydrated with water, diet soda, or unsweetened iced tea. Keep in mind that regular soda, juice, and other mixers may contain a lot of sugar and must be counted as carbs. What are tips for following this plan?  Reading food labels Start by checking the serving size on the Nutrition Facts label of packaged foods and drinks. The number of calories and the amount of carbs, fats, and other nutrients listed on the label are based on one serving of the item. Many items contain more than one serving per package. Check the total grams (g) of carbs in one serving. Check the number of grams of saturated fats and trans fats in one serving. Choose foods that have a low amount or none of these fats. Check the number of milligrams (mg) of salt (sodium) in one serving. Most people should limit total sodium intake to less than 2,300 mg per day. Always check the nutrition information of foods labeled as "low-fat" or "nonfat." These foods may be higher in added sugar or refined carbs and should be avoided. Talk to your dietitian to identify your daily goals for nutrients listed on the label. Shopping Avoid buying canned, pre-made, or processed foods. These foods tend to be high in fat, sodium, and added sugar. Shop around the outside edge of the grocery store. This is where you will most often find fresh fruits and vegetables, bulk grains, fresh meats, and  fresh dairy products. Cooking Use low-heat cooking methods, such as baking, instead of high-heat cooking methods, such as deep frying. Cook using healthy oils, such as olive, canola, or sunflower oil. Avoid cooking with butter, cream, or high-fat meats. Meal planning Eat meals and snacks regularly, preferably at the same times every day. Avoid going long periods of time without eating. Eat foods that are high in fiber, such as fresh fruits,  vegetables, beans, and whole grains. Eat 4-6 oz (112-168 g) of lean protein each day, such as lean meat, chicken, fish, eggs, or tofu. One ounce (oz) (28 g) of lean protein is equal to: 1 oz (28 g) of meat, chicken, or fish. 1 egg.  cup (62 g) of tofu. Eat some foods each day that contain healthy fats, such as avocado, nuts, seeds, and fish. What foods should I eat? Fruits Berries. Apples. Oranges. Peaches. Apricots. Plums. Grapes. Mangoes. Papayas. Pomegranates. Kiwi. Cherries. Vegetables Leafy greens, including lettuce, spinach, kale, chard, collard greens, mustard greens, and cabbage. Beets. Cauliflower. Broccoli. Carrots. Green beans. Tomatoes. Peppers. Onions. Cucumbers. Brussels sprouts. Grains Whole grains, such as whole-wheat or whole-grain bread, crackers, tortillas, cereal, and pasta. Unsweetened oatmeal. Quinoa. Brown or wild rice. Meats and other proteins Seafood. Poultry without skin. Lean cuts of poultry and beef. Tofu. Nuts. Seeds. Dairy Low-fat or fat-free dairy products such as milk, yogurt, and cheese. The items listed above may not be a complete list of foods and beverages you can eat and drink. Contact a dietitian for more information. What foods should I avoid? Fruits Fruits canned with syrup. Vegetables Canned vegetables. Frozen vegetables with butter or cream sauce. Grains Refined white flour and flour products such as bread, pasta, snack foods, and cereals. Avoid all processed foods. Meats and other proteins Fatty cuts of meat. Poultry with skin. Breaded or fried meats. Processed meat. Avoid saturated fats. Dairy Full-fat yogurt, cheese, or milk. Beverages Sweetened drinks, such as soda or iced tea. The items listed above may not be a complete list of foods and beverages you should avoid. Contact a dietitian for more information. Questions to ask a health care provider Do I need to meet with a certified diabetes care and education specialist? Do I need to  meet with a dietitian? What number can I call if I have questions? When are the best times to check my blood glucose? Where to find more information: American Diabetes Association: diabetes.org Academy of Nutrition and Dietetics: eatright.[employer] of Diabetes and Digestive and Kidney Diseases: StageSync.si Association of Diabetes Care & Education Specialists: diabeteseducator.org Summary It is important to have healthy eating habits because your blood sugar (glucose) levels are greatly affected by what you eat and drink. It is important to use alcohol carefully. A healthy meal plan will help you manage your blood glucose and lower your risk of heart disease. Your health care provider may recommend that you work with a dietitian to make a meal plan that is best for you. This information is not intended to replace advice given to you by your health care provider. Make sure you discuss any questions you have with your health care provider. Document Revised: 01/24/2020 Document Reviewed: 01/25/2020 Elsevier Patient Education  2024 Elsevier Inc.    Edwina Barth, MD [facility]

## 2023-08-20 NOTE — Progress Notes (Unsigned)
Aleen Sells D.[facility] [address] [place] [address] Phone: [phone]   Assessment and Plan:     There are no diagnoses linked to this encounter.  ***   Pertinent previous records reviewed include ***    Follow Up: ***     Subjective:   I, Stephanie Joseph, am serving as a Neurosurgeon for Doctor Richardean Sale   Chief Complaint: bilateral knee pain    HPI:    08/10/2023 Patient is a 63 year old female with bilat knee pain. Patient states her knees are okay right at the moment. But they have bothering her for a while. States had to use a heating pad recently to help with the pain. Usually sees Dr. Denyse Amass and gets injections.    08/21/2023 Patient states   Relevant Historical Information: DM type II, hypertension    Additional pertinent review of systems negative.   Current Outpatient Medications:    amLODipine (NORVASC) 10 MG tablet, TAKE ONE TABLET BY MOUTH EVERY DAY, Disp: 90 tablet, Rfl: 3   atorvastatin (LIPITOR) 10 MG tablet, Take 1 tablet (10 mg total) by mouth daily., Disp: 90 tablet, Rfl: 3   azelastine (ASTELIN) 0.1 % nasal spray, Place 1 spray into both nostrils 2 (two) times daily. Use in each nostril as directed, Disp: 30 mL, Rfl: 0   brimonidine-timolol (COMBIGAN) 0.2-0.5 % ophthalmic solution, Place 1 drop into both eyes every 12 (twelve) hours., Disp: , Rfl:    busPIRone (BUSPAR) 7.5 MG tablet, Take 1 tablet (7.5 mg total) by mouth 2 (two) times daily., Disp: 60 tablet, Rfl: 0   estrogen, conjugated,-medroxyprogesterone (PREMPRO) 0.3-1.5 MG tablet, Take 1 tablet by mouth daily., Disp: 30 tablet, Rfl: 8   fluticasone (FLONASE) 50 MCG/ACT nasal spray, Place 2 sprays into both nostrils daily., Disp: 16 g, Rfl: 6   glimepiride (AMARYL) 2 MG tablet, TAKE ONE TABLET BY MOUTH EVERY DAY WITH BREAKFAST., Disp: 90 tablet, Rfl: 1   glucose blood (ACCU-CHEK AVIVA) test strip, Use to check blood sugars 1-3 times daily. Dx Code:  E11.9, Disp: 300 each, Rfl: 1   hydrOXYzine (ATARAX) 25 MG tablet, Take 1 tablet (25 mg total) by mouth every 8 (eight) hours as needed for itching., Disp: 360 tablet, Rfl: 1   ibuprofen (ADVIL) 800 MG tablet, Take 1 tablet (800 mg total) by mouth 3 (three) times daily., Disp: 21 tablet, Rfl: 0   Lancets (ACCU-CHEK SOFT TOUCH) lancets, Use as instructed, Disp: 100 each, Rfl: 12   losartan-hydrochlorothiazide (HYZAAR) 100-12.5 MG tablet, Take 1 tablet by mouth daily., Disp: 90 tablet, Rfl: 3   Misc. Devices (CANE) MISC, Use as needed for left knee pain., Disp: 1 each, Rfl: 0   QUEtiapine Fumarate (SEROQUEL XR) 150 MG 24 hr tablet, Take 1 tablet (150 mg total) by mouth at bedtime., Disp: 30 tablet, Rfl: 2   tirzepatide (MOUNJARO) 5 MG/0.5ML Pen, Inject 5 mg into the skin once a week., Disp: 6 mL, Rfl: 3   TRAVEL SICKNESS 25 MG CHEW, CHEW ONE TABLET BY MOUTH THREE TIMES A DAY - AS NEEDED FOR DIZZINESS (TABLETS MAY BE CHEWED OR SWALLOWED WHOLE), Disp: 30 tablet, Rfl: 1   venlafaxine (EFFEXOR) 75 MG tablet, Take 1 tablet (75 mg total) by mouth 3 (three) times daily with meals., Disp: 270 tablet, Rfl: 1   Objective:     There were no vitals filed for this visit.    There is no height or weight on file to  calculate BMI.    Physical Exam:    ***   Electronically signed by:  Aleen Sells D.[facility] 7:34 AM 08/20/23

## 2023-08-21 ENCOUNTER — Ambulatory Visit: Admitting: Sports Medicine

## 2023-08-21 VITALS — BP 124/86 | HR 99 | Ht 68.0 in | Wt 212.0 lb

## 2023-08-21 DIAGNOSIS — M170 Bilateral primary osteoarthritis of knee: Secondary | ICD-10-CM | POA: Diagnosis not present

## 2023-08-21 DIAGNOSIS — M25562 Pain in left knee: Secondary | ICD-10-CM | POA: Diagnosis not present

## 2023-08-21 DIAGNOSIS — M25561 Pain in right knee: Secondary | ICD-10-CM

## 2023-08-21 DIAGNOSIS — M7062 Trochanteric bursitis, left hip: Secondary | ICD-10-CM

## 2023-08-21 DIAGNOSIS — G8929 Other chronic pain: Secondary | ICD-10-CM

## 2023-09-10 ENCOUNTER — Ambulatory Visit
Admission: RE | Admit: 2023-09-10 | Discharge: 2023-09-10 | Disposition: A | Discharge status: Home / Self Care | Source: Ambulatory Visit | Attending: Emergency Medicine | Admitting: Emergency Medicine

## 2023-09-10 DIAGNOSIS — Z1231 Encounter for screening mammogram for malignant neoplasm of breast: Secondary | ICD-10-CM

## 2023-09-15 ENCOUNTER — Ambulatory Visit
Admission: EM | Admit: 2023-09-15 | Discharge: 2023-09-15 | Disposition: A | Discharge status: Home / Self Care | Attending: Family Medicine | Admitting: Family Medicine

## 2023-09-15 ENCOUNTER — Ambulatory Visit (INDEPENDENT_AMBULATORY_CARE_PROVIDER_SITE_OTHER)

## 2023-09-15 DIAGNOSIS — R058 Other specified cough: Secondary | ICD-10-CM

## 2023-09-15 DIAGNOSIS — R0989 Other specified symptoms and signs involving the circulatory and respiratory systems: Secondary | ICD-10-CM

## 2023-09-15 DIAGNOSIS — F17200 Nicotine dependence, unspecified, uncomplicated: Secondary | ICD-10-CM | POA: Diagnosis not present

## 2023-09-15 DIAGNOSIS — J209 Acute bronchitis, unspecified: Secondary | ICD-10-CM | POA: Diagnosis not present

## 2023-09-15 MED ORDER — ALBUTEROL SULFATE HFA 108 (90 BASE) MCG/ACT IN AERS: 1.0000 | INHALATION_SPRAY | Freq: Four times a day (QID) | RESPIRATORY_TRACT | 0 refills | Status: AC | PRN

## 2023-09-15 MED ORDER — PREDNISONE 20 MG PO TABS: ORAL_TABLET | ORAL | 0 refills | Status: DC

## 2023-09-15 MED ORDER — PROMETHAZINE-DM 6.25-15 MG/5ML PO SYRP: 5.0000 mL | ORAL_SOLUTION | Freq: Three times a day (TID) | ORAL | 0 refills | Status: AC | PRN

## 2023-09-15 NOTE — ED Triage Notes (Signed)
 Pt c/o cough, chest congestion-states sx started "last month"-NAD-steady gait

## 2023-09-15 NOTE — Discharge Instructions (Signed)
 Start prednisone and albuterol for your bronchitis. Hold off on smoking as much as possible or quit all together. Use cough syrup as needed. Will update you later with your x-ray results.    For diabetes or elevated blood sugar, please make sure you are limiting and avoiding starchy, carbohydrate foods like pasta, breads, sweet breads, pastry, rice, potatoes, desserts. These foods can elevate your blood sugar. Also, limit and avoid drinks that contain a lot of sugar such as sodas, sweet teas, fruit juices.  Drinking plain water will be much more helpful, try 80 ounces of water daily.  It is okay to flavor your water naturally by cutting cucumber, lemon, mint or lime, placing it in a picture with water and drinking it over a period of 24-48 hours as long as it remains refrigerated.  For elevated blood pressure, make sure you are monitoring salt in your diet.  Do not eat restaurant foods and limit processed foods at home. I highly recommend you prepare and cook your own foods at home.  Processed foods include things like frozen meals, pre-seasoned meats and dinners, deli meats, canned foods as these foods contain a high amount of sodium/salt.  Make sure you are paying attention to sodium labels on foods you buy at the grocery store. Buy your spices separately such as garlic powder, onion powder, cumin, cayenne, parsley flakes so that you can avoid seasonings that contain salt. However, salt-free seasonings are available and can be used, an example is Mrs. Dash and includes a lot of different mixtures that do not contain salt.  Lastly, when cooking using oils that are healthier for you is important. This includes olive oil, avocado oil, canola oil. We have discussed a lot of foods to avoid but below is a list of foods that can be very healthy to use in your diet whether it is for diabetes, cholesterol, high blood pressure, or in general healthy eating.  Salads - kale, spinach, cabbage, spring mix,  arugula Fruits - avocadoes, berries (blueberries, raspberries, blackberries), apples, oranges, pomegranate, grapefruit, kiwi Vegetables - asparagus, cauliflower, broccoli, green beans, brussel sprouts, bell peppers, beets; stay away from or limit starchy vegetables like potatoes, carrots, peas Other general foods - kidney beans, egg whites, almonds, walnuts, sunflower seeds, pumpkin seeds, fat free yogurt, almond milk, flax seeds, quinoa, oats  Meat - It is better to eat lean meats and limit your red meat including pork to once a week.  Wild caught fish, chicken breast are good options as they tend to be leaner sources of good protein. Still be mindful of the sodium labels for the meats you buy.  DO NOT EAT ANY FOODS ON THIS LIST THAT YOU ARE ALLERGIC TO. For more specific needs, I highly recommend consulting a dietician or nutritionist but this can definitely be a good starting point.

## 2023-09-15 NOTE — ED Provider Notes (Signed)
 [facility]  Note:  This document was prepared using Conservation officer, historic buildings and may include unintentional dictation errors.  MRN: [id number] DOB: 07-16-1960  Subjective:   Stephanie Joseph is a 63 y.o. female presenting for 1 month history of persistent productive cough, chest congestion and tightness.  No fever, shortness of breath or wheezing.  Patient does smoke daily.  No history of asthma.  Has diabetes, A1c was 7%.  No history of congestive heart failure.  No current facility-administered medications for this encounter.  Current Outpatient Medications:    amLODipine (NORVASC) 10 MG tablet, TAKE ONE TABLET BY MOUTH EVERY DAY, Disp: 90 tablet, Rfl: 3   atorvastatin (LIPITOR) 10 MG tablet, Take 1 tablet (10 mg total) by mouth daily., Disp: 90 tablet, Rfl: 3   azelastine (ASTELIN) 0.1 % nasal spray, Place 1 spray into both nostrils 2 (two) times daily. Use in each nostril as directed, Disp: 30 mL, Rfl: 0   brimonidine-timolol (COMBIGAN) 0.2-0.5 % ophthalmic solution, Place 1 drop into both eyes every 12 (twelve) hours., Disp: , Rfl:    busPIRone (BUSPAR) 7.5 MG tablet, Take 1 tablet (7.5 mg total) by mouth 2 (two) times daily., Disp: 60 tablet, Rfl: 0   estrogen, conjugated,-medroxyprogesterone (PREMPRO) 0.3-1.5 MG tablet, Take 1 tablet by mouth daily., Disp: 30 tablet, Rfl: 8   fluticasone (FLONASE) 50 MCG/ACT nasal spray, Place 2 sprays into both nostrils daily., Disp: 16 g, Rfl: 6   glimepiride (AMARYL) 2 MG tablet, TAKE ONE TABLET BY MOUTH EVERY DAY WITH BREAKFAST., Disp: 90 tablet, Rfl: 1   glucose blood (ACCU-CHEK AVIVA) test strip, Use to check blood sugars 1-3 times daily. Dx Code: E11.9, Disp: 300 each, Rfl: 1   hydrOXYzine (ATARAX) 25 MG tablet, Take 1 tablet (25 mg total) by mouth every 8 (eight) hours as needed for itching., Disp: 360 tablet, Rfl: 1   ibuprofen (ADVIL) 800 MG tablet, Take 1 tablet (800 mg total) by mouth 3 (three)  times daily., Disp: 21 tablet, Rfl: 0   Lancets (ACCU-CHEK SOFT TOUCH) lancets, Use as instructed, Disp: 100 each, Rfl: 12   losartan-hydrochlorothiazide (HYZAAR) 100-12.5 MG tablet, Take 1 tablet by mouth daily., Disp: 90 tablet, Rfl: 3   Misc. Devices (CANE) MISC, Use as needed for left knee pain., Disp: 1 each, Rfl: 0   QUEtiapine Fumarate (SEROQUEL XR) 150 MG 24 hr tablet, Take 1 tablet (150 mg total) by mouth at bedtime., Disp: 30 tablet, Rfl: 2   tirzepatide (MOUNJARO) 5 MG/0.5ML Pen, Inject 5 mg into the skin once a week., Disp: 6 mL, Rfl: 3   TRAVEL SICKNESS 25 MG CHEW, CHEW ONE TABLET BY MOUTH THREE TIMES A DAY - AS NEEDED FOR DIZZINESS (TABLETS MAY BE CHEWED OR SWALLOWED WHOLE), Disp: 30 tablet, Rfl: 1   venlafaxine (EFFEXOR) 75 MG tablet, Take 1 tablet (75 mg total) by mouth 3 (three) times daily with meals., Disp: 270 tablet, Rfl: 1   No Known Allergies  Past Medical History:  Diagnosis Date   Anemia, iron deficiency    Arthritis    Depression    Diabetes mellitus without complication (HCC)    Dysmenorrhea    Gout    Heart valve problem    Hypertension    Menorrhagia    Mitral regurgitation    Pneumonia      Past Surgical History:  Procedure Laterality Date   HAMMER TOE SURGERY     KNEE ARTHROSCOPY     Left  Family History  Problem Relation Age of Onset   Hypertension Mother    Heart disease Sister    Stroke Paternal Grandmother    Heart disease Brother    Breast cancer Neg Hx     Social History   Tobacco Use   Smoking status: Every Day    Current packs/day: 0.50    Average packs/day: 0.5 packs/day for 7.0 years (3.5 ttl pk-yrs)    Types: Cigarettes   Smokeless tobacco: Never  Vaping Use   Vaping status: Never Used  Substance Use Topics   Alcohol use: No   Drug use: Not Currently    ROS   Objective:   Vitals: BP 121/76 (BP Location: Left Arm)   Pulse 80   Temp 97.7 F (36.5 C) (Oral)   Resp 20   LMP 02/13/2014 (Within Days)   SpO2 98%    Physical Exam Constitutional:      General: She is not in acute distress.    Appearance: Normal appearance. She is well-developed. She is not ill-appearing, toxic-appearing or diaphoretic.  HENT:     Head: Normocephalic and atraumatic.     Nose: Nose normal.     Mouth/Throat:     Mouth: Mucous membranes are moist.  Eyes:     General: No scleral icterus.       Right eye: No discharge.        Left eye: No discharge.     Extraocular Movements: Extraocular movements intact.  Cardiovascular:     Rate and Rhythm: Normal rate and regular rhythm.     Heart sounds: Normal heart sounds. No murmur heard.    No friction rub. No gallop.  Pulmonary:     Effort: Pulmonary effort is normal. No respiratory distress.     Breath sounds: No stridor. Rhonchi present. No wheezing or rales.  Chest:     Chest wall: No tenderness.  Skin:    General: Skin is warm and dry.  Neurological:     General: No focal deficit present.     Mental Status: She is alert and oriented to person, place, and time.  Psychiatric:        Mood and Affect: Mood normal.        Behavior: Behavior normal.     Assessment and Plan :   PDMP not reviewed this encounter.  1. Acute bronchitis, unspecified organism   2. Productive cough   3. Chest congestion   4. Smoker    Will manage for acute bronchitis with prednisone, albuterol and supportive care.  Follow strict diabetic friendly diet. X-ray over-read was pending at time of discharge, recommended follow up with only abnormal results. Otherwise will not call for negative over-read. Patient was in agreement. Counseled patient on potential for adverse effects with medications prescribed/recommended today, ER and return-to-clinic precautions discussed, patient verbalized understanding.     Wallis Bamberg, [place] 09/15/23 [phone]

## 2023-09-16 ENCOUNTER — Encounter: Payer: Self-pay | Admitting: Emergency Medicine

## 2023-10-07 ENCOUNTER — Other Ambulatory Visit: Payer: Self-pay | Admitting: Emergency Medicine

## 2023-10-07 DIAGNOSIS — I152 Hypertension secondary to endocrine disorders: Secondary | ICD-10-CM

## 2023-10-07 DIAGNOSIS — I10 Essential (primary) hypertension: Secondary | ICD-10-CM

## 2023-10-07 NOTE — Telephone Encounter (Signed)
 Copied from CRM [id number]. Topic: Clinical - Medication Refill >> Oct 07, 2023  1:46 PM Elizebeth Brooking wrote: Most Recent Primary Care Visit:  Provider: Georgina Quint  Department: [facility]  Visit Type: OFFICE VISIT  Date: 08/10/2023  Medication: amLODipine (NORVASC) 10 MG tablet tirzepatide Greggory Keen) 5 MG/0.5ML Pen  Has the patient contacted their pharmacy? Yes (Agent: If no, request that the patient contact the pharmacy for the refill. If patient does not wish to contact the pharmacy document the reason why and proceed with request.) (Agent: If yes, when and what did the pharmacy advise?)  Is this the correct pharmacy for this prescription? Yes If no, delete pharmacy and type the correct one.  This is the patient's preferred pharmacy:  [facility] - [place], [place] - [address] [facility] [name] [place], [place] [address] Phone: [phone]  Fax: [phone]     Has the prescription been filled recently? No  Is the patient out of the medication? Yes  Has the patient been seen for an appointment in the last year OR does the patient have an upcoming appointment? Yes  Can we respond through MyChart? Yes  Agent: Please be advised that Rx refills may take up to 3 business days. We ask that you follow-up with your pharmacy.

## 2023-10-08 ENCOUNTER — Ambulatory Visit: Admitting: Family Medicine

## 2023-10-08 MED ORDER — TIRZEPATIDE 5 MG/0.5ML ~~LOC~~ SOAJ
5.0000 mg | SUBCUTANEOUS | 3 refills | Status: DC
Start: 2023-10-08 — End: 2023-11-09

## 2023-10-08 MED ORDER — AMLODIPINE BESYLATE 10 MG PO TABS: 10.0000 mg | ORAL_TABLET | Freq: Every day | ORAL | 3 refills | Status: AC

## 2023-11-09 ENCOUNTER — Ambulatory Visit (INDEPENDENT_AMBULATORY_CARE_PROVIDER_SITE_OTHER): Admitting: Emergency Medicine

## 2023-11-09 ENCOUNTER — Encounter: Payer: Self-pay | Admitting: Emergency Medicine

## 2023-11-09 VITALS — BP 122/70 | HR 77 | Temp 98.6°F | Ht 68.0 in | Wt 213.0 lb

## 2023-11-09 DIAGNOSIS — E785 Hyperlipidemia, unspecified: Secondary | ICD-10-CM

## 2023-11-09 DIAGNOSIS — I38 Endocarditis, valve unspecified: Secondary | ICD-10-CM

## 2023-11-09 DIAGNOSIS — Z7985 Long-term (current) use of injectable non-insulin antidiabetic drugs: Secondary | ICD-10-CM

## 2023-11-09 DIAGNOSIS — E1159 Type 2 diabetes mellitus with other circulatory complications: Secondary | ICD-10-CM

## 2023-11-09 DIAGNOSIS — Z23 Encounter for immunization: Secondary | ICD-10-CM

## 2023-11-09 DIAGNOSIS — E1169 Type 2 diabetes mellitus with other specified complication: Secondary | ICD-10-CM | POA: Diagnosis not present

## 2023-11-09 DIAGNOSIS — F17200 Nicotine dependence, unspecified, uncomplicated: Secondary | ICD-10-CM

## 2023-11-09 DIAGNOSIS — I152 Hypertension secondary to endocrine disorders: Secondary | ICD-10-CM

## 2023-11-09 LAB — LIPID PANEL
Cholesterol: 112 mg/dL (ref 0–200)
HDL: 50.4 mg/dL (ref >39.00)
LDL Cholesterol: 41 mg/dL (ref 0–99)
NonHDL: 61.17
Total CHOL/HDL Ratio: 2
Triglycerides: 103 mg/dL (ref 0.0–149.0)
VLDL: 20.6 mg/dL (ref 0.0–40.0)

## 2023-11-09 LAB — COMPREHENSIVE METABOLIC PANEL WITH GFR
ALT: 7 U/L (ref 0–35)
AST: 12 U/L (ref 0–37)
Albumin: 4 g/dL (ref 3.5–5.2)
Alkaline Phosphatase: 84 U/L (ref 39–117)
BUN: 8 mg/dL (ref 6–23)
CO2: 29 meq/L (ref 19–32)
Calcium: 9.1 mg/dL (ref 8.4–10.5)
Chloride: 100 meq/L (ref 96–112)
Creatinine, Ser: 0.87 mg/dL (ref 0.40–1.20)
GFR: 71.1 mL/min (ref >60.00)
Glucose, Bld: 148 mg/dL — ABNORMAL HIGH (ref 70–99)
Potassium: 3.8 meq/L (ref 3.5–5.1)
Sodium: 137 meq/L (ref 135–145)
Total Bilirubin: 0.4 mg/dL (ref 0.2–1.2)
Total Protein: 7.1 g/dL (ref 6.0–8.3)

## 2023-11-09 LAB — CBC WITH DIFFERENTIAL/PLATELET
Basophils Absolute: 0 10*3/uL (ref 0.0–0.1)
Basophils Relative: 0.8 % (ref 0.0–3.0)
Eosinophils Absolute: 0.2 10*3/uL (ref 0.0–0.7)
Eosinophils Relative: 3.4 % (ref 0.0–5.0)
HCT: 37.4 % (ref 36.0–46.0)
Hemoglobin: 12.1 g/dL (ref 12.0–15.0)
Lymphocytes Relative: 39 % (ref 12.0–46.0)
Lymphs Abs: 2.2 10*3/uL (ref 0.7–4.0)
MCHC: 32.5 g/dL (ref 30.0–36.0)
MCV: 86 fl (ref 78.0–100.0)
Monocytes Absolute: 0.4 10*3/uL (ref 0.1–1.0)
Monocytes Relative: 7.2 % (ref 3.0–12.0)
Neutro Abs: 2.8 10*3/uL (ref 1.4–7.7)
Neutrophils Relative %: 49.6 % (ref 43.0–77.0)
Platelets: 329 10*3/uL (ref 150.0–400.0)
RBC: 4.35 Mil/uL (ref 3.87–5.11)
RDW: 15.8 % — ABNORMAL HIGH (ref 11.5–15.5)
WBC: 5.6 10*3/uL (ref 4.0–10.5)

## 2023-11-09 LAB — MICROALBUMIN / CREATININE URINE RATIO
Creatinine,U: 117.6 mg/dL
Microalb Creat Ratio: UNDETERMINED mg/g (ref 0.0–30.0)
Microalb, Ur: <0.7 mg/dL

## 2023-11-09 LAB — POCT GLYCOSYLATED HEMOGLOBIN (HGB A1C): Hemoglobin A1C: 7.4 % — AB (ref 4.0–5.6)

## 2023-11-09 MED ORDER — TIRZEPATIDE 7.5 MG/0.5ML ~~LOC~~ SOAJ
7.5000 mg | SUBCUTANEOUS | 3 refills | Status: AC
Start: 2023-11-09 — End: ?

## 2023-11-09 NOTE — Assessment & Plan Note (Signed)
 Well-controlled hypertension Continue amlodipine  10 mg daily and Hyzaar 100-12.5 mg daily Hemoglobin A1c today at 7.4, higher than before Cardiovascular risks associated with hypertension and diabetes discussed Diet and nutrition discussed Recommend to increase  Mounjaro  to 7.5 mg weekly and continue glimepiride  2 mg daily Follow-up in 3 months

## 2023-11-09 NOTE — Progress Notes (Signed)
 Stephanie Joseph 63 y.o.   Chief Complaint  Patient presents with   Follow-up    3 month f/u for DM. No other concerns     HISTORY OF PRESENT ILLNESS: This is a 63 y.o. female here for 23-month follow-up of dyslipidemia and diabetes Still smoking. Has no complaints or any other medical concerns today. Lab Results  Component Value Date   HGBA1C 7.0 (A) 08/10/2023   Wt Readings from Last 3 Encounters:  11/09/23 213 lb (96.6 kg)  08/21/23 212 lb (96.2 kg)  08/10/23 216 lb (98 kg)     HPI   Prior to Admission medications   Medication Sig Start Date End Date Taking? Authorizing Provider  amLODipine  (NORVASC ) 10 MG tablet Take 1 tablet (10 mg total) by mouth daily. 10/08/23  Yes Ashyia Schraeder, Isidro Margo, MD  atorvastatin  (LIPITOR) 10 MG tablet Take 1 tablet (10 mg total) by mouth daily. 06/22/23  Yes Mirtie Bastyr, Isidro Margo, MD  azelastine  (ASTELIN ) 0.1 % nasal spray Place 1 spray into both nostrils 2 (two) times daily. Use in each nostril as directed 08/03/22  Yes Burnette, Leory Rands, PA-C  brimonidine-timolol  (COMBIGAN) 0.2-0.5 % ophthalmic solution Place 1 drop into both eyes every 12 (twelve) hours.   Yes [provider]  busPIRone  (BUSPAR ) 7.5 MG tablet Take 1 tablet (7.5 mg total) by mouth 2 (two) times daily. 10/27/22  Yes Sarinah Doetsch Jose, MD  estrogen, conjugated,-medroxyprogesterone  (PREMPRO ) 0.3-1.5 MG tablet Take 1 tablet by mouth daily. 10/29/22  Yes Abigail Abler, MD  fluticasone  (FLONASE ) 50 MCG/ACT nasal spray Place 2 sprays into both nostrils daily. 05/13/21  Yes Dorothe Gaster, NP  glimepiride  (AMARYL ) 2 MG tablet TAKE ONE TABLET BY MOUTH EVERY DAY WITH BREAKFAST. 06/22/23  Yes Brea Coleson, Isidro Margo, MD  glucose blood (ACCU-CHEK AVIVA) test strip Use to check blood sugars 1-3 times daily. Dx Code: E11.9 01/20/18  Yes Adra Alanis, FNP  hydrOXYzine  (ATARAX ) 25 MG tablet Take 1 tablet (25 mg total) by mouth every 8 (eight) hours as needed for  itching. 04/01/22  Yes Mayfield Schoene, Isidro Margo, MD  ibuprofen  (ADVIL ) 800 MG tablet Take 1 tablet (800 mg total) by mouth 3 (three) times daily. 03/26/23  Yes Adolph Hoop, PA-C  Lancets (ACCU-CHEK SOFT TOUCH) lancets Use as instructed 05/21/17  Yes Dragnev, Licia Reek, NP  losartan -hydrochlorothiazide (HYZAAR) 100-12.5 MG tablet Take 1 tablet by mouth daily. 09/15/22  Yes Cannan Beeck, Isidro Margo, MD  Misc. Devices (CANE) MISC Use as needed for left knee pain. 01/29/18  Yes Dragnev, Licia Reek, NP  QUEtiapine  Fumarate (SEROQUEL  XR) 150 MG 24 hr tablet Take 1 tablet (150 mg total) by mouth at bedtime. 10/10/21  Yes Penn, Cicely, NP  tirzepatide  (MOUNJARO ) 5 MG/0.5ML Pen Inject 5 mg into the skin once a week. 10/08/23  Yes Shareka Casale, Isidro Margo, MD  TRAVEL SICKNESS 25 MG CHEW CHEW ONE TABLET BY MOUTH THREE TIMES A DAY - AS NEEDED FOR DIZZINESS (TABLETS MAY BE CHEWED OR SWALLOWED WHOLE) 06/23/22  Yes Krisa Blattner, Isidro Margo, MD  venlafaxine  (EFFEXOR ) 75 MG tablet Take 1 tablet (75 mg total) by mouth 3 (three) times daily with meals. 09/26/21  Yes Penn, Alaine Howells, NP  albuterol  (VENTOLIN  HFA) 108 (90 Base) MCG/ACT inhaler Inhale 1-2 puffs into the lungs every 6 (six) hours as needed for wheezing or shortness of breath. Patient not taking: Reported on 11/09/2023 09/15/23   Adolph Hoop, PA-C  predniSONE  (DELTASONE ) 20 MG tablet Take 2 tablets daily with breakfast. Patient not taking:  Reported on 11/09/2023 09/15/23   Adolph Hoop, PA-C  promethazine -dextromethorphan (PROMETHAZINE -DM) 6.25-15 MG/5ML syrup Take 5 mLs by mouth 3 (three) times daily as needed for cough. Patient not taking: Reported on 11/09/2023 09/15/23   Adolph Hoop, PA-C    No Known Allergies  Patient Active Problem List   Diagnosis Date Noted   Chronic left hip pain 04/01/2022   Hot flashes due to menopause 09/24/2021   Chronic midline low back pain 07/12/2018   Glaucoma 05/21/2017   Class 1 obesity due to excess calories with serious  comorbidity and body mass index (BMI) of 33.0 to 33.9 in adult 09/19/2016   Vitamin D  deficiency 09/19/2016   Dyslipidemia associated with type 2 diabetes mellitus (HCC) 07/10/2015   Hormone replacement therapy (postmenopausal) 10/04/2014   Mitral regurgitation 11/05/2012   Valvular heart disease 10/21/2011   Anxiety and depression 03/04/2010   GOUT 12/16/2006   ANEMIA, IRON DEFICIENCY NOS 12/16/2006   Current smoker 12/16/2006   Hypertension associated with diabetes (HCC) 12/16/2006    Past Medical History:  Diagnosis Date   Anemia, iron deficiency    Arthritis    Depression    Diabetes mellitus without complication (HCC)    Dysmenorrhea    Gout    Heart valve problem    Hypertension    Menorrhagia    Mitral regurgitation    Pneumonia     Past Surgical History:  Procedure Laterality Date   HAMMER TOE SURGERY     KNEE ARTHROSCOPY     Left    Social History   Socioeconomic History   Marital status: Married    Spouse name: Not on file   Number of children: 1   Years of education: 12   Highest education level: Not on file  Occupational History   Occupation: Disability Pending  Tobacco Use   Smoking status: Every Day    Current packs/day: 0.50    Average packs/day: 0.5 packs/day for 7.0 years (3.5 ttl pk-yrs)    Types: Cigarettes   Smokeless tobacco: Never  Vaping Use   Vaping status: Never Used  Substance and Sexual Activity   Alcohol use: No   Drug use: Not Currently   Sexual activity: Not Currently    Birth control/protection: None, Post-menopausal  Other Topics Concern   Not on file  Social History Narrative   Fun: Go to church   Denies abuse and feels safe at home.    Social Drivers of Corporate investment banker Strain: Not on file  Food Insecurity: Not on file  Transportation Needs: Not on file  Physical Activity: Not on file  Stress: Not on file  Social Connections: Not on file  Intimate Partner Violence: Not on file    Family History   Problem Relation Age of Onset   Hypertension Mother    Heart disease Sister    Stroke Paternal Grandmother    Heart disease Brother    Breast cancer Neg Hx      Review of Systems  Constitutional: Negative.  Negative for chills and fever.  HENT: Negative.  Negative for congestion and sore throat.   Respiratory: Negative.  Negative for cough and shortness of breath.   Cardiovascular: Negative.  Negative for chest pain and palpitations.  Gastrointestinal:  Negative for abdominal pain, diarrhea, nausea and vomiting.  Genitourinary: Negative.  Negative for dysuria and hematuria.  Skin: Negative.  Negative for rash.  Neurological: Negative.  Negative for dizziness and headaches.  All other systems reviewed and are  negative.   Today's Vitals   11/09/23 1022  BP: 122/70  Pulse: 77  Temp: 98.6 F (37 C)  TempSrc: Oral  SpO2: 97%  Weight: 213 lb (96.6 kg)  Height: 5\' 8"  (1.727 m)   Body mass index is 32.39 kg/m.   Physical Exam Vitals reviewed.  Constitutional:      Appearance: Normal appearance.  HENT:     Head: Normocephalic.     Mouth/Throat:     Mouth: Mucous membranes are moist.     Pharynx: Oropharynx is clear.  Eyes:     Extraocular Movements: Extraocular movements intact.     Pupils: Pupils are equal, round, and reactive to light.  Cardiovascular:     Rate and Rhythm: Normal rate and regular rhythm.     Pulses: Normal pulses.     Heart sounds: Normal heart sounds.  Pulmonary:     Effort: Pulmonary effort is normal.     Breath sounds: Normal breath sounds.  Musculoskeletal:     Cervical back: No tenderness.  Lymphadenopathy:     Cervical: No cervical adenopathy.  Skin:    General: Skin is warm and dry.     Capillary Refill: Capillary refill takes less than 2 seconds.  Neurological:     General: No focal deficit present.     Mental Status: She is alert and oriented to person, place, and time.  Psychiatric:        Mood and Affect: Mood normal.         Behavior: Behavior normal.     Results for orders placed or performed in visit on 11/09/23 (from the past 24 hours)  POCT HgB A1C     Status: Abnormal   Collection Time: 11/09/23 10:40 AM  Result Value Ref Range   Hemoglobin A1C 7.4 (A) 4.0 - 5.6 %   HbA1c POC (<> result, manual entry)     HbA1c, POC (prediabetic range)     HbA1c, POC (controlled diabetic range)      ASSESSMENT & PLAN: A total of 42 minutes was spent with the patient and counseling/coordination of care regarding preparing for this visit, review of most recent office visit notes, review of multiple chronic medical conditions and their management, cardiovascular risks associated with uncontrolled diabetes, review of all medications and changes made, review of most recent bloodwork results including interpretation of today's hemoglobin A1c, review of health maintenance items, vaccination update and need for Prevnar 20, education on nutrition, prognosis, documentation, and need for follow up.   Problem List Items Addressed This Visit       Cardiovascular and Mediastinum   Hypertension associated with diabetes (HCC) - Primary   Well-controlled hypertension Continue amlodipine  10 mg daily and Hyzaar 100-12.5 mg daily Hemoglobin A1c today at 7.4, higher than before Cardiovascular risks associated with hypertension and diabetes discussed Diet and nutrition discussed Recommend to increase  Mounjaro  to 7.5 mg weekly and continue glimepiride  2 mg daily Follow-up in 3 months      Relevant Orders   Microalbumin / creatinine urine ratio   Comprehensive metabolic panel with GFR   CBC with Differential/Platelet   Lipid panel   Valvular heart disease   Stable. Recent echocardiogram results reviewed. Sees cardiologist on a regular basis.         Endocrine   Dyslipidemia associated with type 2 diabetes mellitus (HCC)   Chronic stable condition Continue atorvastatin  10 mg daily Diet and nutrition discussed Lipid profile  done today  Relevant Orders   POCT HgB A1C (Completed)   Microalbumin / creatinine urine ratio   Comprehensive metabolic panel with GFR   CBC with Differential/Platelet   Lipid panel     Other   Current smoker   Cardiovascular and cancer risk associated with smoking discussed Smoking cessation advice given.      Other Visit Diagnoses       Need for vaccination       Relevant Orders   Pneumococcal conjugate vaccine 20-valent (Prevnar 20)      Patient Instructions  Diabetes Mellitus and Nutrition, Adult When you have diabetes, or diabetes mellitus, it is very important to have healthy eating habits because your blood sugar (glucose) levels are greatly affected by what you eat and drink. Eating healthy foods in the right amounts, at about the same times every day, can help you: Manage your blood glucose. Lower your risk of heart disease. Improve your blood pressure. Reach or maintain a healthy weight. What can affect my meal plan? Every person with diabetes is different, and each person has different needs for a meal plan. Your health care provider may recommend that you work with a dietitian to make a meal plan that is best for you. Your meal plan may vary depending on factors such as: The calories you need. The medicines you take. Your weight. Your blood glucose, blood pressure, and cholesterol levels. Your activity level. Other health conditions you have, such as heart or kidney disease. How do carbohydrates affect me? Carbohydrates, also called carbs, affect your blood glucose level more than any other type of food. Eating carbs raises the amount of glucose in your blood. It is important to know how many carbs you can safely have in each meal. This is different for every person. Your dietitian can help you calculate how many carbs you should have at each meal and for each snack. How does alcohol affect me? Alcohol can cause a decrease in blood glucose  (hypoglycemia), especially if you use insulin or take certain diabetes medicines by mouth. Hypoglycemia can be a life-threatening condition. Symptoms of hypoglycemia, such as sleepiness, dizziness, and confusion, are similar to symptoms of having too much alcohol. Do not drink alcohol if: Your health care provider tells you not to drink. You are pregnant, may be pregnant, or are planning to become pregnant. If you drink alcohol: Limit how much you have to: 0-1 drink a day for women. 0-2 drinks a day for men. Know how much alcohol is in your drink. In the [place] one drink equals one 12 oz bottle of beer (355 mL), one 5 oz glass of wine (148 mL), or one 1 oz glass of hard liquor (44 mL). Keep yourself hydrated with water, diet soda, or unsweetened iced tea. Keep in mind that regular soda, juice, and other mixers may contain a lot of sugar and must be counted as carbs. What are tips for following this plan?  Reading food labels Start by checking the serving size on the Nutrition Facts label of packaged foods and drinks. The number of calories and the amount of carbs, fats, and other nutrients listed on the label are based on one serving of the item. Many items contain more than one serving per package. Check the total grams (g) of carbs in one serving. Check the number of grams of saturated fats and trans fats in one serving. Choose foods that have a low amount or none of these fats. Check the number of milligrams (mg)  of salt (sodium) in one serving. Most people should limit total sodium intake to less than 2,300 mg per day. Always check the nutrition information of foods labeled as "low-fat" or "nonfat." These foods may be higher in added sugar or refined carbs and should be avoided. Talk to your dietitian to identify your daily goals for nutrients listed on the label. Shopping Avoid buying canned, pre-made, or processed foods. These foods tend to be high in fat, sodium, and added sugar. Shop  around the outside edge of the grocery store. This is where you will most often find fresh fruits and vegetables, bulk grains, fresh meats, and fresh dairy products. Cooking Use low-heat cooking methods, such as baking, instead of high-heat cooking methods, such as deep frying. Cook using healthy oils, such as olive, canola, or sunflower oil. Avoid cooking with butter, cream, or high-fat meats. Meal planning Eat meals and snacks regularly, preferably at the same times every day. Avoid going long periods of time without eating. Eat foods that are high in fiber, such as fresh fruits, vegetables, beans, and whole grains. Eat 4-6 oz (112-168 g) of lean protein each day, such as lean meat, chicken, fish, eggs, or tofu. One ounce (oz) (28 g) of lean protein is equal to: 1 oz (28 g) of meat, chicken, or fish. 1 egg.  cup (62 g) of tofu. Eat some foods each day that contain healthy fats, such as avocado, nuts, seeds, and fish. What foods should I eat? Fruits Berries. Apples. Oranges. Peaches. Apricots. Plums. Grapes. Mangoes. Papayas. Pomegranates. Kiwi. Cherries. Vegetables Leafy greens, including lettuce, spinach, kale, chard, collard greens, mustard greens, and cabbage. Beets. Cauliflower. Broccoli. Carrots. Green beans. Tomatoes. Peppers. Onions. Cucumbers. Brussels sprouts. Grains Whole grains, such as whole-wheat or whole-grain bread, crackers, tortillas, cereal, and pasta. Unsweetened oatmeal. Quinoa. Brown or wild rice. Meats and other proteins Seafood. Poultry without skin. Lean cuts of poultry and beef. Tofu. Nuts. Seeds. Dairy Low-fat or fat-free dairy products such as milk, yogurt, and cheese. The items listed above may not be a complete list of foods and beverages you can eat and drink. Contact a dietitian for more information. What foods should I avoid? Fruits Fruits canned with syrup. Vegetables Canned vegetables. Frozen vegetables with butter or cream sauce. Grains Refined  white flour and flour products such as bread, pasta, snack foods, and cereals. Avoid all processed foods. Meats and other proteins Fatty cuts of meat. Poultry with skin. Breaded or fried meats. Processed meat. Avoid saturated fats. Dairy Full-fat yogurt, cheese, or milk. Beverages Sweetened drinks, such as soda or iced tea. The items listed above may not be a complete list of foods and beverages you should avoid. Contact a dietitian for more information. Questions to ask a health care provider Do I need to meet with a certified diabetes care and education specialist? Do I need to meet with a dietitian? What number can I call if I have questions? When are the best times to check my blood glucose? Where to find more information: American Diabetes Association: diabetes.org Academy of Nutrition and Dietetics: eatright.[employer] of Diabetes and Digestive and Kidney Diseases: StageSync.si Association of Diabetes Care & Education Specialists: diabeteseducator.org Summary It is important to have healthy eating habits because your blood sugar (glucose) levels are greatly affected by what you eat and drink. It is important to use alcohol carefully. A healthy meal plan will help you manage your blood glucose and lower your risk of heart disease. Your health care provider may  recommend that you work with a dietitian to make a meal plan that is best for you. This information is not intended to replace advice given to you by your health care provider. Make sure you discuss any questions you have with your health care provider. Document Revised: 01/24/2020 Document Reviewed: 01/25/2020 Elsevier Patient Education  2024 Elsevier Inc.     Maryagnes Small, MD [facility]

## 2023-11-09 NOTE — Assessment & Plan Note (Signed)
Stable.  Recent echocardiogram results reviewed.  Sees cardiologist on a regular basis.

## 2023-11-09 NOTE — Assessment & Plan Note (Signed)
Chronic stable condition. Continue atorvastatin 10 mg daily Diet and nutrition discussed. Lipid profile done today.

## 2023-11-09 NOTE — Assessment & Plan Note (Signed)
Cardiovascular and cancer risk associated with smoking discussed. Smoking cessation advice given. 

## 2023-11-09 NOTE — Patient Instructions (Signed)

## 2023-11-10 ENCOUNTER — Encounter: Payer: Self-pay | Admitting: Emergency Medicine

## 2023-11-12 LAB — HM DIABETES EYE EXAM

## 2023-12-21 ENCOUNTER — Other Ambulatory Visit: Payer: Self-pay | Admitting: Emergency Medicine

## 2023-12-21 DIAGNOSIS — E1159 Type 2 diabetes mellitus with other circulatory complications: Secondary | ICD-10-CM

## 2023-12-21 NOTE — Telephone Encounter (Unsigned)
 Copied from CRM [id number]. Topic: Clinical - Medication Refill >> Dec 21, 2023 11:27 AM Mesmerise C wrote: Medication:  glimepiride  (AMARYL ) 2 MG tablet   losartan -hydrochlorothiazide (HYZAAR) 100-12.5 MG tablet   Has the patient contacted their pharmacy? Yes (Agent: If no, request that the patient contact the pharmacy for the refill. If patient does not wish to contact the pharmacy document the reason why and proceed with request.) (Agent: If yes, when and what did the pharmacy advise?)  This is the patient's preferred pharmacy:  [facility] - [place], [place] - [address] [facility] [address] [address] [place] [address] Phone: [phone] Fax: [phone] Hours: Not open 24 hours   Is this the correct pharmacy for this prescription? Yes If no, delete pharmacy and type the correct one.   Has the prescription been filled recently? No  Is the patient out of the medication? No  Has the patient been seen for an appointment in the last year OR does the patient have an upcoming appointment? Yes  Can we respond through MyChart? Yes  Agent: Please be advised that Rx refills may take up to 3 business days. We ask that you follow-up with your pharmacy.

## 2023-12-22 MED ORDER — LOSARTAN POTASSIUM-HCTZ 100-12.5 MG PO TABS: 1.0000 | ORAL_TABLET | Freq: Every day | ORAL | 3 refills | Status: AC

## 2023-12-22 MED ORDER — GLIMEPIRIDE 2 MG PO TABS: ORAL_TABLET | ORAL | 1 refills | Status: AC

## 2023-12-30 NOTE — Progress Notes (Unsigned)
 LILLETTE Ileana Collet, PhD, LAT, ATC acting as a scribe for Artist Lloyd, MD.  Stephanie Joseph is a 63 y.o. female who presents to [facility] at [facility] today for L knee and back pain. Pt was previously seen by Dr. Leonce on 08/21/23 for bilat knee OA. Last bilat knee steroid injections, 08/10/23.  Today, pt c/o L knee pain returning about 2 wks ago. R knee is OK. No swelling.  Pt also c/o low back pain ongoing for about a wk, worsening. Pt locates pain to the midline of her low back and across both sides. She also notes a lump on the lateral aspect of her L proximal thigh.  Radiating pain: no LE numbness/tingling: no LE weakness: no Aggravates: prolonged standing Treatments tried: BioFreeze, cap  Dx imaging: 11/22/22 R knee MRI 09/16/22 R & L knee XR 07/13/20 L knee XR             12/21/17 R knee XR  Pertinent review of systems: No fevers or chills  Relevant historical information: Hypertension and diabetes.  Dyslipidemia.  Knee arthritis.   Exam:  BP (!) 172/88   Pulse 84   Ht 5' 8 (1.727 m)   Wt 206 lb (93.4 kg)   LMP 02/13/2014 (Within Days)   SpO2 100%   BMI 31.32 kg/m  General: Well Developed, well nourished, and in no acute distress.   MSK: L-spine nontender to palpation spinal midline.  Tender palpation paraspinal musculature. Decreased lumbar motion.  Left hip: Normal appearing Tender palpation greater trochanter.  Hip abduction strength is mildly limited.  Left knee: Normal-appearing normal motion.    Lab and Radiology Results  Procedure: Real-time Ultrasound Guided Injection of left knee joint superior lateral patella space Device: Philips Affiniti 50G/GE Logiq Images permanently stored and available for review in PACS Verbal informed consent obtained.  Discussed risks and benefits of procedure. Warned about infection, bleeding, hyperglycemia damage to structures among others. Patient expresses understanding and  agreement Time-out conducted.   Noted no overlying erythema, induration, or other signs of local infection.   Skin prepped in a sterile fashion.   Local anesthesia: Topical Ethyl chloride.   With sterile technique and under real time ultrasound guidance: 40 mg of Kenalog  and 2 mL of Marcaine injected into knee joint. Fluid seen entering the joint capsule.   Completed without difficulty   Pain immediately resolved suggesting accurate placement of the medication.   Advised to call if fevers/chills, erythema, induration, drainage, or persistent bleeding.   Images permanently stored and available for review in the ultrasound unit.  Impression: Technically successful ultrasound guided injection.   Procedure: Real-time Ultrasound Guided Injection of left hip greater trochanter bursa Device: Philips Affiniti 50G/GE Logiq Images permanently stored and available for review in PACS Verbal informed consent obtained.  Discussed risks and benefits of procedure. Warned about infection, bleeding, hyperglycemia damage to structures among others. Patient expresses understanding and agreement Time-out conducted.   Noted no overlying erythema, induration, or other signs of local infection.   Skin prepped in a sterile fashion.   Local anesthesia: Topical Ethyl chloride.   With sterile technique and under real time ultrasound guidance: 40 mg of Kenalog  and 2 mL of Marcaine injected into greater trochanter bursa. Fluid seen entering the bursa.   Completed without difficulty   Pain immediately resolved suggesting accurate placement of the medication.   Advised to call if fevers/chills, erythema, induration, drainage, or persistent bleeding.   Images permanently stored and available for  review in the ultrasound unit.  Impression: Technically successful ultrasound guided injection.   X-ray images lumbar spine obtained today personally and independently interpreted. DDD L4-5 and L5-S1.  No acute fractures are  visible. Await formal radiology review    Assessment and Plan: 63 y.o. female with chronic left knee pain due to exacerbation of underlying DJD.  Previous injection was early February 2025.  Plan for repeat steroid injection today.  She has had some benefit in the past with Zilretta  injections.  If today's injection does not last longer than 3 months she will let us  know ahead of time and we can get Zilretta  authorized.  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  Physical therapy will be quite helpful for this.  Plan to refer to PT.  Additionally we will proceed with greater trochanter bursa injection today.  Chronic low back pain due to muscle spasm and dysfunction as well as some degenerative changes.  Plan for physical therapy and lumbar spine x-ray.  Recheck in 8 weeks.  I will be out on medical leave during this time period.  She does have an already established relationship with my partner Dr. Leonce.  She should schedule follow-up appoint with him at the 8-week mark.  Return back sooner or contact me if she has any problems in the meantime.   PDMP not reviewed this encounter. Orders Placed This Encounter  Procedures   US  LIMITED JOINT SPACE STRUCTURES LOW RIGHT(NO LINKED CHARGES)    Reason for Exam (SYMPTOM  OR DIAGNOSIS REQUIRED):   right knee pain    Preferred imaging location?:   [facility] Lumbar Spine 2-3 Views    Standing Status:   Future    Number of Occurrences:   1    Expiration Date:   01/30/2024    Reason for Exam (SYMPTOM  OR DIAGNOSIS REQUIRED):   low back pain    Preferred imaging location?:    [place]   No orders of the defined types were placed in this encounter.    Discussed warning signs or symptoms. Please see discharge instructions. Patient expresses understanding.   The above documentation has been reviewed and is accurate and complete Artist Lloyd, M.D.

## 2023-12-31 ENCOUNTER — Ambulatory Visit (INDEPENDENT_AMBULATORY_CARE_PROVIDER_SITE_OTHER)

## 2023-12-31 ENCOUNTER — Ambulatory Visit: Admitting: Family Medicine

## 2023-12-31 ENCOUNTER — Other Ambulatory Visit: Payer: Self-pay

## 2023-12-31 VITALS — BP 172/88 | HR 84 | Ht 68.0 in | Wt 206.0 lb

## 2023-12-31 DIAGNOSIS — G8929 Other chronic pain: Secondary | ICD-10-CM | POA: Diagnosis not present

## 2023-12-31 DIAGNOSIS — M545 Low back pain, unspecified: Secondary | ICD-10-CM

## 2023-12-31 DIAGNOSIS — M25562 Pain in left knee: Secondary | ICD-10-CM | POA: Diagnosis not present

## 2023-12-31 DIAGNOSIS — M25552 Pain in left hip: Secondary | ICD-10-CM

## 2023-12-31 NOTE — Patient Instructions (Addendum)
 Thank you for coming in today.   I've referred you to Physical Therapy here, at this office.  You will hear from our office soon about scheduling, once we check with your insurance company.   Please get an Xray today before you leave   You received an injection today. Seek immediate medical attention if the joint becomes red, extremely painful, or is oozing fluid.   Schedule a follow-up visit with Dr. Leonce in 6-8 weeks

## 2024-01-04 ENCOUNTER — Ambulatory Visit: Payer: Self-pay | Admitting: Family Medicine

## 2024-01-04 NOTE — Progress Notes (Signed)
Lumbar spine x-ray shows arthritis.

## 2024-01-04 NOTE — Progress Notes (Signed)
 Patient ID: Stephanie Joseph, female   DOB: 04-19-1961, 63 y.o.   MRN: [id number]      63 y.o. followed for murmur since 2015. 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 No LVE and no symptoms of chest pain, dyspnea, syncope or palpitaitons  CRF;s Include DM, HLD and HTN.   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   Husband at home with her has back issues as well Daughter Engineer, maintenance at home and works at [employer]  ROS: Denies fever, malais, weight loss, blurry vision, decreased visual acuity, cough, sputum, SOB, hemoptysis, pleuritic pain, palpitaitons, heartburn, abdominal pain, melena, lower extremity edema, claudication, or rash.  All other systems reviewed and negative  General: LMP 02/13/2014 (Within Days)  Affect appropriate Overweight black female  HEENT: normal Neck supple with no adenopathy JVP normal no bruits no thyromegaly Lungs clear with no wheezing and good diaphragmatic motion Heart:  S1/S2 AR/MR  murmur , no rub, gallop or click PMI normal Abdomen: benighn, BS positve, no tenderness, no AAA no bruit.  No HSM or HJR Distal pulses intact with no bruits No edema Neuro non-focal Skin warm and dry No muscular weakness    Current Outpatient Medications  Medication Sig Dispense Refill   albuterol  (VENTOLIN  HFA) 108 (90 Base) MCG/ACT inhaler Inhale 1-2 puffs into the lungs every 6 (six) hours as needed for wheezing or shortness of breath. (Patient not taking:  Reported on 11/09/2023) 18 g 0   amLODipine  (NORVASC ) 10 MG tablet Take 1 tablet (10 mg total) by mouth daily. 90 tablet 3   atorvastatin  (LIPITOR) 10 MG tablet Take 1 tablet (10 mg total) by mouth daily. 90 tablet 3   azelastine  (ASTELIN ) 0.1 % nasal spray Place 1 spray into both nostrils 2 (two) times daily. Use in each nostril as directed 30 mL 0   brimonidine-timolol  (COMBIGAN) 0.2-0.5 % ophthalmic solution Place 1 drop into both eyes every 12 (twelve) hours.     busPIRone  (BUSPAR ) 7.5 MG tablet Take 1 tablet (7.5 mg total) by mouth 2 (two) times daily. 60 tablet 0   estrogen, conjugated,-medroxyprogesterone  (PREMPRO ) 0.3-1.5 MG tablet Take 1 tablet by mouth daily. 30 tablet 8   fluticasone  (FLONASE ) 50 MCG/ACT nasal spray Place 2 sprays into both nostrils daily. 16 g 6   glimepiride  (AMARYL ) 2 MG tablet TAKE ONE TABLET BY MOUTH EVERY DAY WITH BREAKFAST. 90 tablet 1   glucose blood (ACCU-CHEK AVIVA) test strip Use to check blood sugars 1-3 times daily. Dx Code: E11.9 300 each 1   hydrOXYzine  (ATARAX ) 25 MG tablet Take 1 tablet (25 mg total) by mouth every 8 (eight) hours as needed for itching. 360 tablet 1   Lancets (ACCU-CHEK SOFT TOUCH) lancets Use as instructed 100 each 12   losartan -hydrochlorothiazide (HYZAAR) 100-12.5 MG tablet Take 1 tablet by mouth daily. 90 tablet 3   promethazine -dextromethorphan (PROMETHAZINE -DM) 6.25-15 MG/5ML syrup Take 5 mLs by mouth 3 (three) times daily as needed for  cough. (Patient not taking: Reported on 11/09/2023) 200 mL 0   QUEtiapine  Fumarate (SEROQUEL  XR) 150 MG 24 hr tablet Take 1 tablet (150 mg total) by mouth at bedtime. 30 tablet 2   tirzepatide  (MOUNJARO ) 7.5 MG/0.5ML Pen Inject 7.5 mg into the skin once a week. 6 mL 3   TRAVEL SICKNESS 25 MG CHEW CHEW ONE TABLET BY MOUTH THREE TIMES A DAY - AS NEEDED FOR DIZZINESS (TABLETS MAY BE CHEWED OR SWALLOWED WHOLE) 30 tablet 1   venlafaxine  (EFFEXOR ) 75 MG tablet Take 1 tablet (75 mg total) by mouth 3 (three)  times daily with meals. 270 tablet 1   No current facility-administered medications for this visit.    Allergies  Patient has no known allergies.  Electrocardiogram:  01/12/2024 SR rate 81 normal   Assessment and Plan  MR /AR update TTE. No change in murmurs. Moderate AR and mild MR on TTE 12/16/22 with compensated non dilated LV   Bipolar/Anxiety/Depression:  Stable f/u psychiatry on multiple meds   IF:Ipdrlddzi low carb diet.  Target hemoglobin A1c is 6.5 or less.  Continue current medications.  HTN: Well controlled.  Continue current medications and low sodium Dash type diet.    HLD:  Continue statin labs with primary LDL 41 11/09/23   Ortho: left trochanteric bursitis prior injection   Echo for valve dx  F/U in a year    Maude Emmer MD [facility]

## 2024-01-12 ENCOUNTER — Ambulatory Visit: Attending: Cardiology | Admitting: Cardiovascular Disease

## 2024-01-12 ENCOUNTER — Encounter: Payer: Self-pay | Admitting: Cardiovascular Disease

## 2024-01-12 VITALS — BP 116/78 | HR 83 | Ht 68.0 in | Wt 203.8 lb

## 2024-01-12 DIAGNOSIS — E1159 Type 2 diabetes mellitus with other circulatory complications: Secondary | ICD-10-CM | POA: Diagnosis not present

## 2024-01-12 DIAGNOSIS — E785 Hyperlipidemia, unspecified: Secondary | ICD-10-CM

## 2024-01-12 DIAGNOSIS — I351 Nonrheumatic aortic (valve) insufficiency: Secondary | ICD-10-CM

## 2024-01-12 DIAGNOSIS — E1169 Type 2 diabetes mellitus with other specified complication: Secondary | ICD-10-CM

## 2024-01-12 DIAGNOSIS — I340 Nonrheumatic mitral (valve) insufficiency: Secondary | ICD-10-CM | POA: Diagnosis not present

## 2024-01-12 DIAGNOSIS — I152 Hypertension secondary to endocrine disorders: Secondary | ICD-10-CM

## 2024-01-12 NOTE — Patient Instructions (Signed)
 Medication Instructions:  Your physician recommends that you continue on your current medications as directed. Please refer to the Current Medication list given to you today.  *If you need a refill on your cardiac medications before your next appointment, please call your pharmacy*  Lab Work: If you have labs (blood work) drawn today and your tests are completely normal, you will receive your results only by: MyChart Message (if you have MyChart) OR A paper copy in the mail If you have any lab test that is abnormal or we need to change your treatment, we will call you to review the results.  Testing/Procedures: Your physician has requested that you have an echocardiogram. Echocardiography is a painless test that uses sound waves to create images of your heart. It provides your doctor with information about the size and shape of your heart and how well your heart's chambers and valves are working. This procedure takes approximately one hour. There are no restrictions for this procedure. Please do NOT wear cologne, perfume, aftershave, or lotions (deodorant is allowed). Please arrive 15 minutes prior to your appointment time.  Please note: We ask at that you not bring children with you during ultrasound (echo/ vascular) testing. Due to room size and safety concerns, children are not allowed in the ultrasound rooms during exams. Our front office staff cannot provide observation of children in our lobby area while testing is being conducted. An adult accompanying a patient to their appointment will only be allowed in the ultrasound room at the discretion of the ultrasound technician under special circumstances. We apologize for any inconvenience. Follow-Up: At [facility], you and your health needs are our priority.  As part of our continuing mission to provide you with exceptional heart care, our providers are all part of one team.  This team includes your primary Cardiologist (physician)  and Advanced Practice Providers or APPs (Physician Assistants and Nurse Practitioners) who all work together to provide you with the care you need, when you need it.  Your next appointment:   1 year(s)  Provider:   Janelle Mediate, MD    We recommend signing up for the patient portal called "MyChart".  Sign up information is provided on this After Visit Summary.  MyChart is used to connect with patients for Virtual Visits (Telemedicine).  Patients are able to view lab/test results, encounter notes, upcoming appointments, etc.  Non-urgent messages can be sent to your provider as well.   To learn more about what you can do with MyChart, go to [email or web address].

## 2024-01-20 ENCOUNTER — Encounter: Payer: Self-pay | Admitting: Physical Therapy

## 2024-01-20 ENCOUNTER — Other Ambulatory Visit: Payer: Self-pay

## 2024-01-20 ENCOUNTER — Ambulatory Visit (INDEPENDENT_AMBULATORY_CARE_PROVIDER_SITE_OTHER): Admitting: Physical Therapy

## 2024-01-20 DIAGNOSIS — M6281 Muscle weakness (generalized): Secondary | ICD-10-CM

## 2024-01-20 DIAGNOSIS — M25552 Pain in left hip: Secondary | ICD-10-CM

## 2024-01-20 DIAGNOSIS — M5459 Other low back pain: Secondary | ICD-10-CM | POA: Diagnosis not present

## 2024-01-20 NOTE — Therapy (Signed)
 OUTPATIENT PHYSICAL THERAPY EVALUATION   Patient Name: Stephanie Joseph MRN: [id number] DOB:26-Mar-1961, 63 y.o., female Today's Date: 01/20/2024   END OF SESSION:  PT End of Session - 01/20/24 1357     Visit Number 1    Number of Visits 9    Date for PT Re-Evaluation 03/16/24    Authorization Type [employer]    PT Start Time 1345    PT Stop Time 1425    PT Time Calculation (min) 40 min    Activity Tolerance Patient tolerated treatment well    Behavior During Therapy WFL for tasks assessed/performed          Past Medical History:  Diagnosis Date   Anemia, iron deficiency    Arthritis    Depression    Diabetes mellitus without complication (HCC)    Dysmenorrhea    Gout    Heart valve problem    Hypertension    Menorrhagia    Mitral regurgitation    Pneumonia    Past Surgical History:  Procedure Laterality Date   HAMMER TOE SURGERY     KNEE ARTHROSCOPY     Left   Patient Active Problem List   Diagnosis Date Noted   Chronic left hip pain 04/01/2022   Hot flashes due to menopause 09/24/2021   Chronic midline low back pain 07/12/2018   Glaucoma 05/21/2017   Class 1 obesity due to excess calories with serious comorbidity and body mass index (BMI) of 33.0 to 33.9 in adult 09/19/2016   Vitamin D  deficiency 09/19/2016   Dyslipidemia associated with type 2 diabetes mellitus (HCC) 07/10/2015   Hormone replacement therapy (postmenopausal) 10/04/2014   Mitral regurgitation 11/05/2012   Valvular heart disease 10/21/2011   Anxiety and depression 03/04/2010   GOUT 12/16/2006   ANEMIA, IRON DEFICIENCY NOS 12/16/2006   Current smoker 12/16/2006   Hypertension associated with diabetes (HCC) 12/16/2006    PCP: Purcell Emil Schanz, MD  REFERRING PROVIDER: Joane Artist RAMAN, MD  REFERRING DIAG: Chronic midline low back pain without sciatica; Chronic left hip pain  Rationale for Evaluation and Treatment: Rehabilitation  THERAPY DIAG:  Other low back pain  Pain  in left hip  Muscle weakness (generalized)  ONSET DATE: Chronic   SUBJECTIVE:           SUBJECTIVE STATEMENT: Patient reports lower back pain and left hip pain. States she can hardly walk. Pain has been going on for 1-2 years and is across her back. She got a shot that lasted for about a year and then it started hurting again. The back will bother her when she is walking, standing while cooking, sitting, bending, and sleeping. It will sometimes wake her up at night. She also reports left hip pain on the outside when she lays on that side. She did get a shot in her hip the other day which has helped with the pain.   PERTINENT HISTORY:  See PMH above  PAIN:  Are you having pain? Yes:  NPRS scale: 0/10, pain can get to 10/10 Pain location: Lower back Pain description: Ache Aggravating factors: Standing, sitting, walking, bending Relieving factors: Creams, heating pad  NPRS scale: 4/10 currently, pain can get to 10/10 Pain location: Left  Pain description: Ache Aggravating factors: Lying on left side Relieving factors: Cream  PRECAUTIONS: None  RED FLAGS: None   WEIGHT BEARING RESTRICTIONS: No  FALLS:  Has patient fallen in last 6 months? No  PLOF: Independent  PATIENT GOALS: Pain relief   OBJECTIVE:  Note: Objective measures were completed at Evaluation unless otherwise noted. PATIENT SURVEYS:  PSFS: 6.5 Sitting in church: 5 Standing to cook: 8 Walking: 5 Bending: 8  COGNITION: Overall cognitive status: Within functional limits for tasks assessed     SENSATION: WFL  MUSCLE LENGTH: Hamstring flexibility grossly WFL  POSTURE:   Rounded shoulder posture, bilateral knee valgus  PALPATION: Tender left greater trochanter and left lumbar paraspinals  LUMBAR ROM:   AROM eval  Flexion WFL  Extension 50%  Right lateral flexion 75%  Left lateral flexion 75%  Right rotation 50%  Left rotation 50%   (Blank rows = not tested)  LOWER EXTREMITY ROM:       Hip PROM grossly WFL  LOWER EXTREMITY MMT:    MMT Right eval Left eval  Hip flexion 4 4-  Hip extension 3 3  Hip abduction 4- 3+  Hip adduction    Hip internal rotation    Hip external rotation    Knee flexion 5 4  Knee extension 5 4  Ankle dorsiflexion    Ankle plantarflexion    Ankle inversion    Ankle eversion     (Blank rows = not tested)  LUMBAR SPECIAL TESTS:  Radicular testing negative  FUNCTIONAL TESTS:  5 times sit to stand: 14 seconds  GAIT: Assistive device utilized: None Level of assistance: Complete Independence Comments: Trendelenburg, bilateral knee valgus   TREATMENT [facility] Adult PT Treatment:                                                DATE: 01/20/2024 LTR SLR Bridge Sidelying hip abduction Sit to stand  PATIENT EDUCATION:  Education details: Exam findings, POC, HEP Person educated: Patient Education method: Programmer, multimedia, Demonstration, Tactile cues, Verbal cues, and Handouts Education comprehension: verbalized understanding, returned demonstration, verbal cues required, tactile cues required, and needs further education  HOME EXERCISE PROGRAM: Access Code: [email or web address]    ASSESSMENT: CLINICAL IMPRESSION: Patient is a 63 y.o. female who was seen today for physical therapy evaluation and treatment for chronic lower back and left hip pain. Currently she demonstrates limitations in her lumbar mobility and gross core and hip strength that is likely contributing to her pain and impacting her functional ability.   OBJECTIVE IMPAIRMENTS: Abnormal gait, decreased activity tolerance, decreased ROM, decreased strength, postural dysfunction, and pain.   ACTIVITY LIMITATIONS: lifting, bending, sitting, standing, sleeping, and locomotion level  PARTICIPATION LIMITATIONS: meal prep, cleaning, shopping, and community activity  PERSONAL FACTORS: Fitness, Past/current experiences, and Time since onset of injury/illness/exacerbation are also affecting  patient's functional outcome.   REHAB POTENTIAL: Excellent  CLINICAL DECISION MAKING: Stable/uncomplicated  EVALUATION COMPLEXITY: Low   GOALS: Goals reviewed with patient? Yes  SHORT TERM GOALS: Target date: 02/17/2024  Patient will be I with initial HEP in order to progress with therapy. Baseline: HEP provided at eval Goal status: INITIAL  2.  Patient will report low back and left hip pain at worst </= 5/10 in order to reduce functional limitations Baseline: 10/10 at worst Goal status: INITIAL  LONG TERM GOALS: Target date: 03/16/2024  Patient will be I with final HEP to maintain progress from PT. Baseline: HEP provided at eval Goal status: INITIAL  2.  Patient will report PSFS >/= 8 in order to indicate improvement in their functional ability. Baseline: 6.5 Goal status: INITIAL  3.  Patient will  demonstrate gross hip strength >/= 4-/5 MMT in order to improve her standing and walking tolerance Baseline: see limitations above Goal status: INITIAL  4.  Patient will demonstrate 5xSTS </= 10 seconds in order to indicate improvement in strength and mobility Baseline: 14 seconds Goal status: INITIAL   PLAN: PT FREQUENCY: 1x/week  PT DURATION: 8 weeks  PLANNED INTERVENTIONS: 97164- PT Re-evaluation, 97750- Physical Performance Testing, 97110-Therapeutic exercises, 97530- Therapeutic activity, [id number]- Neuromuscular re-education, 97535- Self Care, [id number]- Manual therapy, [id number]- Gait training, [phone]- Ionotophoresis 4mg /ml Dexamethasone , Patient/Family education, Joint mobilization, Joint manipulation, Spinal manipulation, Spinal mobilization, Cryotherapy, and Moist heat.  PLAN FOR NEXT SESSION: Review HEP and progress PRN, manual/stretching for lumbar mobility, progress core and hip strengthening as tolerated   Elaine Daring, PT, DPT, LAT, ATC 01/20/24  2:29 PM Phone: [phone] Fax: [phone]

## 2024-01-20 NOTE — Patient Instructions (Signed)
 Access Code: [email or web address] URL: [email or web address] Date: 01/20/2024 Prepared by: Elaine Daring  Exercises - Supine Lower Trunk Rotation  - 1 x daily - 10 reps - 5 seconds hold - Straight Leg Raise  - 1 x daily - 2 sets - 10 reps - Bridge  - 1 x daily - 2 sets - 10 reps - Sidelying Hip Abduction  - 1 x daily - 2 sets - 10 reps - Sit to Stand Without Arm Support  - 1 x daily - 2 sets - 10 reps

## 2024-02-03 ENCOUNTER — Other Ambulatory Visit: Payer: Self-pay

## 2024-02-03 ENCOUNTER — Encounter: Payer: Self-pay | Admitting: Physical Therapy

## 2024-02-03 ENCOUNTER — Ambulatory Visit (INDEPENDENT_AMBULATORY_CARE_PROVIDER_SITE_OTHER): Admitting: Physical Therapy

## 2024-02-03 DIAGNOSIS — M5459 Other low back pain: Secondary | ICD-10-CM

## 2024-02-03 DIAGNOSIS — M25552 Pain in left hip: Secondary | ICD-10-CM

## 2024-02-03 DIAGNOSIS — M6281 Muscle weakness (generalized): Secondary | ICD-10-CM

## 2024-02-03 NOTE — Therapy (Addendum)
 " OUTPATIENT PHYSICAL THERAPY TREATMENT  DISCHARGE   Patient Name: Stephanie Joseph MRN: [id number] DOB:[birth year], 63 y.o., female Today's Date: 02/03/2024   END OF SESSION:  PT End of Session - 02/03/24 1348     Visit Number 2    Number of Visits 9    Date for PT Re-Evaluation 03/16/24    Authorization Type [employer]    PT Start Time 1345    PT Stop Time 1425    PT Time Calculation (min) 40 min    Activity Tolerance Patient tolerated treatment well    Behavior During Therapy WFL for tasks assessed/performed           Past Medical History:  Diagnosis Date   Anemia, iron deficiency    Arthritis    Depression    Diabetes mellitus without complication (HCC)    Dysmenorrhea    Gout    Heart valve problem    Hypertension    Menorrhagia    Mitral regurgitation    Pneumonia    Past Surgical History:  Procedure Laterality Date   HAMMER TOE SURGERY     KNEE ARTHROSCOPY     Left   Patient Active Problem List   Diagnosis Date Noted   Chronic left hip pain 04/01/2022   Hot flashes due to menopause 09/24/2021   Chronic midline low back pain 07/12/2018   Glaucoma 05/21/2017   Class 1 obesity due to excess calories with serious comorbidity and body mass index (BMI) of 33.0 to 33.9 in adult 09/19/2016   Vitamin D  deficiency 09/19/2016   Dyslipidemia associated with type 2 diabetes mellitus (HCC) 07/10/2015   Hormone replacement therapy (postmenopausal) 10/04/2014   Mitral regurgitation 11/05/2012   Valvular heart disease 10/21/2011   Anxiety and depression 03/04/2010   GOUT 12/16/2006   ANEMIA, IRON DEFICIENCY NOS 12/16/2006   Current smoker 12/16/2006   Hypertension associated with diabetes (HCC) 12/16/2006    PCP: [facility] Emil Schanz, MD  REFERRING PROVIDER: Joane Artist RAMAN, MD  REFERRING DIAG: Chronic midline low back pain without sciatica; Chronic left hip pain  Rationale for Evaluation and Treatment: Rehabilitation  THERAPY DIAG:  Other low back  pain  Pain in left hip  Muscle weakness (generalized)  ONSET DATE: Chronic   SUBJECTIVE:           SUBJECTIVE STATEMENT: Patient reports she is doing good, the exercises is helping her.   Eval: Patient reports lower back pain and left hip pain. States she can hardly walk. Pain has been going on for 1-2 years and is across her back. She got a shot that lasted for about a year and then it started hurting again. The back will bother her when she is walking, standing while cooking, sitting, bending, and sleeping. It will sometimes wake her up at night. She also reports left hip pain on the outside when she lays on that side. She did get a shot in her hip the other day which has helped with the pain.   PERTINENT HISTORY:  See PMH above  PAIN:  Are you having pain? Yes:  NPRS scale: 0/10 currently Pain location: Lower back Pain description: Ache Aggravating factors: Standing, sitting, walking, bending Relieving factors: Creams, heating pad  NPRS scale: 2/10 currently Pain location: Left hip Pain description: Ache Aggravating factors: Lying on left side Relieving factors: Cream  PRECAUTIONS: None  PATIENT GOALS: Pain relief   OBJECTIVE:  Note: Objective measures were completed at Evaluation unless otherwise noted. PATIENT SURVEYS:  PSFS: 6.5  Sitting in church: 5 Standing to cook: 8 Walking: 5 Bending: 8  MUSCLE LENGTH: Hamstring flexibility grossly WFL  POSTURE:   Rounded shoulder posture, bilateral knee valgus  PALPATION: Tender left greater trochanter and left lumbar paraspinals  LUMBAR ROM:   AROM eval  Flexion WFL  Extension 50%  Right lateral flexion 75%  Left lateral flexion 75%  Right rotation 50%  Left rotation 50%   (Blank rows = not tested)  LOWER EXTREMITY ROM:      Hip PROM grossly WFL  LOWER EXTREMITY MMT:    MMT Right eval Left eval Left 02/03/2024  Hip flexion 4 4-   Hip extension 3 3   Hip abduction 4- 3+ 3+  Hip adduction     Hip  internal rotation     Hip external rotation     Knee flexion 5 4   Knee extension 5 4 4   Ankle dorsiflexion     Ankle plantarflexion     Ankle inversion     Ankle eversion      (Blank rows = not tested)  LUMBAR SPECIAL TESTS:  Radicular testing negative  FUNCTIONAL TESTS:  5 times sit to stand: 14 seconds  GAIT: Assistive device utilized: None Level of assistance: Complete Independence Comments: Trendelenburg, bilateral knee valgus   TREATMENT [facility] Adult PT Treatment:                                                DATE: 02/03/2024 Recumbent bike L4 x 6 min to improve endurance and workload capacity LTR x 10 Piriformis stretch 3 x 20 sec each SLR 2 x 10 - partial range on left Bridge 2 x 10 Hooklying clamshell with black 2 x 10 LAQ with 3# 2 x 10 each Standing hip abduction with yellow at knees 2 x 10 each Sit to stand 2 x 10 Deadlift from 8 box with 15# 2 x 10  PATIENT EDUCATION:  Education details: HEP Person educated: Patient Education method: Programmer, Multimedia, Demonstration, Actor cues, Verbal cues Education comprehension: verbalized understanding, returned demonstration, verbal cues required, tactile cues required, and needs further education  HOME EXERCISE PROGRAM: Access Code: [email or web address]    ASSESSMENT: CLINICAL IMPRESSION: Patient tolerated therapy well with no adverse effects. Therapy focused primarily on progressing her core, hip, and LE strengthening with good tolerance. She was able to progress with her hip strengthening and incorporated some lifting this visit. She does require cueing for proper lifting mechanics using hip hinge technique. She does continue to exhibit left hip and knee strength deficits. No changes made to her HEP this visit. Patient would benefit from continued skilled PT to progress mobility and strength in order to reduce pain and maximize functional ability.  Eval: Patient is a 63 y.o. female who was seen today for physical therapy  evaluation and treatment for chronic lower back and left hip pain. Currently she demonstrates limitations in her lumbar mobility and gross core and hip strength that is likely contributing to her pain and impacting her functional ability.   OBJECTIVE IMPAIRMENTS: Abnormal gait, decreased activity tolerance, decreased ROM, decreased strength, postural dysfunction, and pain.   ACTIVITY LIMITATIONS: lifting, bending, sitting, standing, sleeping, and locomotion level  PARTICIPATION LIMITATIONS: meal prep, cleaning, shopping, and community activity  PERSONAL FACTORS: Fitness, Past/current experiences, and Time since onset of injury/illness/exacerbation are also affecting patient's functional outcome.  GOALS: Goals reviewed with patient? Yes  SHORT TERM GOALS: Target date: 02/17/2024  Patient will be I with initial HEP in order to progress with therapy. Baseline: HEP provided at eval Goal status: INITIAL  2.  Patient will report low back and left hip pain at worst </= 5/10 in order to reduce functional limitations Baseline: 10/10 at worst Goal status: INITIAL  LONG TERM GOALS: Target date: 03/16/2024  Patient will be I with final HEP to maintain progress from PT. Baseline: HEP provided at eval Goal status: INITIAL  2.  Patient will report PSFS >/= 8 in order to indicate improvement in their functional ability. Baseline: 6.5 Goal status: INITIAL  3.  Patient will demonstrate gross hip strength >/= 4-/5 MMT in order to improve her standing and walking tolerance Baseline: see limitations above Goal status: INITIAL  4.  Patient will demonstrate 5xSTS </= 10 seconds in order to indicate improvement in strength and mobility Baseline: 14 seconds Goal status: INITIAL   PLAN: PT FREQUENCY: 1x/week  PT DURATION: 8 weeks  PLANNED INTERVENTIONS: [id number]- PT Re-evaluation, 97750- Physical Performance Testing, 97110-Therapeutic exercises, 97530- Therapeutic activity, [id number]- Neuromuscular  re-education, 97535- Self Care, [id number]- Manual therapy, [id number]- Gait training, [phone]- Ionotophoresis 4mg /ml Dexamethasone , Patient/Family education, Joint mobilization, Joint manipulation, Spinal manipulation, Spinal mobilization, Cryotherapy, and Moist heat.  PLAN FOR NEXT SESSION: Review HEP and progress PRN, manual/stretching for lumbar mobility, progress core and hip strengthening as tolerated   Elaine Daring, PT, DPT, LAT, ATC 02/03/24  2:29 PM Phone: [phone] Fax: [phone]    PHYSICAL THERAPY DISCHARGE SUMMARY  Visits from Start of Care: 2  Current functional level related to goals / functional outcomes: See above   Remaining deficits: See above   Education / Equipment: HEP   Patient agrees to discharge. Patient goals were not met. Patient is being discharged due to not returning since the last visit.  Elaine Daring, PT, DPT, LAT, ATC 07/20/2024  10:34 AM Phone: [phone] Fax: [phone]   "

## 2024-02-10 ENCOUNTER — Encounter: Admitting: Physical Therapy

## 2024-02-18 ENCOUNTER — Encounter: Admitting: Physical Therapy

## 2024-02-23 ENCOUNTER — Ambulatory Visit (HOSPITAL_COMMUNITY)
Admission: RE | Admit: 2024-02-23 | Discharge: 2024-02-23 | Disposition: A | Discharge status: Home / Self Care | Source: Ambulatory Visit | Attending: Internal Medicine | Admitting: Internal Medicine

## 2024-02-23 ENCOUNTER — Ambulatory Visit: Payer: Self-pay | Admitting: Cardiovascular Disease

## 2024-02-23 DIAGNOSIS — I351 Nonrheumatic aortic (valve) insufficiency: Secondary | ICD-10-CM

## 2024-02-23 DIAGNOSIS — E785 Hyperlipidemia, unspecified: Secondary | ICD-10-CM | POA: Diagnosis present

## 2024-02-23 DIAGNOSIS — I340 Nonrheumatic mitral (valve) insufficiency: Secondary | ICD-10-CM | POA: Diagnosis not present

## 2024-02-23 DIAGNOSIS — E1169 Type 2 diabetes mellitus with other specified complication: Secondary | ICD-10-CM | POA: Insufficient documentation

## 2024-02-23 DIAGNOSIS — E1159 Type 2 diabetes mellitus with other circulatory complications: Secondary | ICD-10-CM | POA: Insufficient documentation

## 2024-02-23 DIAGNOSIS — I152 Hypertension secondary to endocrine disorders: Secondary | ICD-10-CM | POA: Diagnosis present

## 2024-02-23 LAB — ECHOCARDIOGRAM COMPLETE
Area-P 1/2: 4.06 cm2
MV M vel: 5.57 m/s
MV Peak grad: 124.1 mmHg
P 1/2 time: 312 ms
S' Lateral: 3.5 cm

## 2024-02-24 ENCOUNTER — Encounter: Admitting: Physical Therapy

## 2024-02-25 ENCOUNTER — Ambulatory Visit: Admitting: Sports Medicine

## 2024-03-10 ENCOUNTER — Ambulatory Visit: Admitting: Emergency Medicine

## 2024-03-10 ENCOUNTER — Encounter: Payer: Self-pay | Admitting: Emergency Medicine

## 2024-03-10 VITALS — BP 110/72 | HR 71 | Temp 98.3°F | Ht 68.0 in | Wt 204.0 lb

## 2024-03-10 DIAGNOSIS — E785 Hyperlipidemia, unspecified: Secondary | ICD-10-CM | POA: Diagnosis not present

## 2024-03-10 DIAGNOSIS — I38 Endocarditis, valve unspecified: Secondary | ICD-10-CM

## 2024-03-10 DIAGNOSIS — F17200 Nicotine dependence, unspecified, uncomplicated: Secondary | ICD-10-CM

## 2024-03-10 DIAGNOSIS — I152 Hypertension secondary to endocrine disorders: Secondary | ICD-10-CM

## 2024-03-10 DIAGNOSIS — E1159 Type 2 diabetes mellitus with other circulatory complications: Secondary | ICD-10-CM

## 2024-03-10 DIAGNOSIS — Z7985 Long-term (current) use of injectable non-insulin antidiabetic drugs: Secondary | ICD-10-CM

## 2024-03-10 DIAGNOSIS — E1169 Type 2 diabetes mellitus with other specified complication: Secondary | ICD-10-CM

## 2024-03-10 LAB — POCT GLYCOSYLATED HEMOGLOBIN (HGB A1C): Hemoglobin A1C: 6.6 % — AB (ref 4.0–5.6)

## 2024-03-10 NOTE — Assessment & Plan Note (Signed)
 Chronic stable condition. Continue atorvastatin  10 mg daily Diet and nutrition discussed

## 2024-03-10 NOTE — Assessment & Plan Note (Signed)
 BP Readings from Last 3 Encounters:  03/10/24 110/72  01/12/24 116/78  12/31/23 (!) 172/88   Well-controlled hypertension Continue amlodipine  10 mg daily and Hyzaar 100-12.5 mg daily Hemoglobin A1c today at 6.6.  At goal Cardiovascular risks associated with hypertension and diabetes discussed Diet and nutrition discussed Recommend to continue Mounjaro  7.5 mg weekly and continue glimepiride  2 mg daily Follow-up in 6 months

## 2024-03-10 NOTE — Progress Notes (Signed)
 Stephanie Joseph 63 y.o.   Chief Complaint  Patient presents with   Follow-up    Patient here for 3 month f/u for HTN / DM. No other concerns.     HISTORY OF PRESENT ILLNESS: This is a 63 y.o. female here for 61-month follow-up of hypertension and diabetes Still smoking. Compliant with medications Not so compliant with diet No other complaints or medical concerns today. Wt Readings from Last 3 Encounters:  03/10/24 204 lb (92.5 kg)  01/12/24 203 lb 12.8 oz (92.4 kg)  12/31/23 206 lb (93.4 kg)     HPI   Prior to Admission medications   Medication Sig Start Date End Date Taking? Authorizing Provider  albuterol  (VENTOLIN  HFA) 108 (90 Base) MCG/ACT inhaler Inhale 1-2 puffs into the lungs every 6 (six) hours as needed for wheezing or shortness of breath. 09/15/23  Yes Christopher Savannah, PA-C  amLODipine  (NORVASC ) 10 MG tablet Take 1 tablet (10 mg total) by mouth daily. 10/08/23  Yes Darleth Eustache, Emil Schanz, MD  atorvastatin  (LIPITOR) 10 MG tablet Take 1 tablet (10 mg total) by mouth daily. 06/22/23  Yes Saralynn Langhorst, Emil Schanz, MD  brimonidine-timolol  (COMBIGAN) 0.2-0.5 % ophthalmic solution Place 1 drop into both eyes every 12 (twelve) hours.   Yes [provider]  busPIRone  (BUSPAR ) 7.5 MG tablet Take 1 tablet (7.5 mg total) by mouth 2 (two) times daily. 10/27/22  Yes Kobe Jansma Jose, MD  estrogen, conjugated,-medroxyprogesterone  (PREMPRO ) 0.3-1.5 MG tablet Take 1 tablet by mouth daily. 10/29/22  Yes Zina Jerilynn LABOR, MD  fluticasone  (FLONASE ) 50 MCG/ACT nasal spray Place 2 sprays into both nostrils daily. 05/13/21  Yes Wendee Lynwood HERO, NP  glimepiride  (AMARYL ) 2 MG tablet TAKE ONE TABLET BY MOUTH EVERY DAY WITH BREAKFAST. 12/22/23  Yes Bethanie Bloxom, Emil Schanz, MD  glucose blood (ACCU-CHEK AVIVA) test strip Use to check blood sugars 1-3 times daily. Dx Code: E11.9 01/20/18  Yes Jason Leita Repine, FNP  hydrOXYzine  (ATARAX ) 25 MG tablet Take 1 tablet (25 mg total) by mouth  every 8 (eight) hours as needed for itching. 04/01/22  Yes Jaela Yepez, Emil Schanz, MD  Lancets (ACCU-CHEK SOFT TOUCH) lancets Use as instructed 05/21/17  Yes Dragnev, Romualdo Capers, NP  losartan -hydrochlorothiazide (HYZAAR) 100-12.5 MG tablet Take 1 tablet by mouth daily. 12/22/23  Yes Soundra Lampley, Emil Schanz, MD  QUEtiapine  Fumarate (SEROQUEL  XR) 150 MG 24 hr tablet Take 1 tablet (150 mg total) by mouth at bedtime. 10/10/21  Yes Penn, Cicely, NP  tirzepatide  (MOUNJARO ) 7.5 MG/0.5ML Pen Inject 7.5 mg into the skin once a week. 11/09/23  Yes Mycah Formica, Emil Schanz, MD  TRAVEL SICKNESS 25 MG CHEW CHEW ONE TABLET BY MOUTH THREE TIMES A DAY - AS NEEDED FOR DIZZINESS (TABLETS MAY BE CHEWED OR SWALLOWED WHOLE) 06/23/22  Yes Taimur Fier, Emil Schanz, MD  venlafaxine  (EFFEXOR ) 75 MG tablet Take 1 tablet (75 mg total) by mouth 3 (three) times daily with meals. 09/26/21  Yes Penn, Cicely, NP  azelastine  (ASTELIN ) 0.1 % nasal spray Place 1 spray into both nostrils 2 (two) times daily. Use in each nostril as directed Patient not taking: Reported on 03/10/2024 08/03/22   Vivienne Delon HERO, PA-C  promethazine -dextromethorphan (PROMETHAZINE -DM) 6.25-15 MG/5ML syrup Take 5 mLs by mouth 3 (three) times daily as needed for cough. Patient not taking: Reported on 03/10/2024 09/15/23   Christopher Savannah, PA-C    No Known Allergies  Patient Active Problem List   Diagnosis Date Noted   Chronic left hip pain 04/01/2022   Hot flashes due  to menopause 09/24/2021   Chronic midline low back pain 07/12/2018   Glaucoma 05/21/2017   Class 1 obesity due to excess calories with serious comorbidity and body mass index (BMI) of 33.0 to 33.9 in adult 09/19/2016   Vitamin D  deficiency 09/19/2016   Dyslipidemia associated with type 2 diabetes mellitus (HCC) 07/10/2015   Hormone replacement therapy (postmenopausal) 10/04/2014   Mitral regurgitation 11/05/2012   Valvular heart disease 10/21/2011   Anxiety and depression 03/04/2010   GOUT  12/16/2006   ANEMIA, IRON DEFICIENCY NOS 12/16/2006   Current smoker 12/16/2006   Hypertension associated with diabetes (HCC) 12/16/2006    Past Medical History:  Diagnosis Date   Anemia, iron deficiency    Arthritis    Depression    Diabetes mellitus without complication (HCC)    Dysmenorrhea    Gout    Heart valve problem    Hypertension    Menorrhagia    Mitral regurgitation    Pneumonia     Past Surgical History:  Procedure Laterality Date   HAMMER TOE SURGERY     KNEE ARTHROSCOPY     Left    Social History   Socioeconomic History   Marital status: Married    Spouse name: Not on file   Number of children: 1   Years of education: 12   Highest education level: Not on file  Occupational History   Occupation: Disability Pending  Tobacco Use   Smoking status: Every Day    Current packs/day: 0.50    Average packs/day: 0.5 packs/day for 7.0 years (3.5 ttl pk-yrs)    Types: Cigarettes   Smokeless tobacco: Never  Vaping Use   Vaping status: Never Used  Substance and Sexual Activity   Alcohol use: No   Drug use: Not Currently   Sexual activity: Not Currently    Birth control/protection: None, Post-menopausal  Other Topics Concern   Not on file  Social History Narrative   Fun: Go to church   Denies abuse and feels safe at home.    Social Drivers of Corporate investment banker Strain: Not on file  Food Insecurity: Not on file  Transportation Needs: Not on file  Physical Activity: Not on file  Stress: Not on file  Social Connections: Not on file  Intimate Partner Violence: Not on file    Family History  Problem Relation Age of Onset   Hypertension Mother    Heart disease Sister    Stroke Paternal Grandmother    Heart disease Brother    Breast cancer Neg Hx      Review of Systems  Constitutional: Negative.  Negative for chills and fever.  HENT: Negative.  Negative for congestion and sore throat.   Respiratory: Negative.  Negative for cough and  shortness of breath.   Cardiovascular: Negative.  Negative for chest pain and palpitations.  Gastrointestinal:  Negative for abdominal pain, diarrhea, nausea and vomiting.  Genitourinary: Negative.  Negative for dysuria and hematuria.  Skin: Negative.  Negative for rash.  Neurological: Negative.  Negative for dizziness and headaches.    Vitals:   03/10/24 0950  BP: 110/72  Pulse: 71  Temp: 98.3 F (36.8 C)  SpO2: 98%    Physical Exam Vitals reviewed.  Constitutional:      Appearance: Normal appearance. She is obese.  HENT:     Head: Normocephalic.  Eyes:     Extraocular Movements: Extraocular movements intact.     Pupils: Pupils are equal, round, and reactive to light.  Cardiovascular:     Rate and Rhythm: Normal rate and regular rhythm.     Heart sounds: Murmur heard.  Pulmonary:     Effort: Pulmonary effort is normal.     Breath sounds: Normal breath sounds.  Musculoskeletal:     Cervical back: No tenderness.  Lymphadenopathy:     Cervical: No cervical adenopathy.  Skin:    General: Skin is warm and dry.     Capillary Refill: Capillary refill takes less than 2 seconds.  Neurological:     General: No focal deficit present.     Mental Status: She is alert and oriented to person, place, and time.  Psychiatric:        Mood and Affect: Mood normal.        Behavior: Behavior normal.    Results for orders placed or performed in visit on 03/10/24 (from the past 24 hours)  POCT HgB A1C     Status: Abnormal   Collection Time: 03/10/24 10:20 AM  Result Value Ref Range   Hemoglobin A1C 6.6 (A) 4.0 - 5.6 %   HbA1c POC (<> result, manual entry)     HbA1c, POC (prediabetic range)     HbA1c, POC (controlled diabetic range)       ASSESSMENT & PLAN: A total of 42 minutes was spent with the patient and counseling/coordination of care regarding preparing for this visit, review of most recent office visit notes, review of multiple chronic medical conditions and their  management, cardiovascular risks associated with diabetes hypertension and smoking, review of all medications, review of most recent bloodwork results including interpretation of today's hemoglobin A1c, review of health maintenance items, education on nutrition, prognosis, documentation, and need for follow up.   Problem List Items Addressed This Visit       Cardiovascular and Mediastinum   Hypertension associated with diabetes (HCC) - Primary   BP Readings from Last 3 Encounters:  03/10/24 110/72  01/12/24 116/78  12/31/23 (!) 172/88   Well-controlled hypertension Continue amlodipine  10 mg daily and Hyzaar 100-12.5 mg daily Hemoglobin A1c today at 6.6.  At goal Cardiovascular risks associated with hypertension and diabetes discussed Diet and nutrition discussed Recommend to continue Mounjaro  7.5 mg weekly and continue glimepiride  2 mg daily Follow-up in 6 months      Valvular heart disease   Stable. Recent echocardiogram results reviewed. Sees cardiologist on a regular basi         Endocrine   Dyslipidemia associated with type 2 diabetes mellitus (HCC)   Chronic stable condition Continue atorvastatin  10 mg daily Diet and nutrition discussed       Relevant Orders   POCT HgB A1C (Completed)     Other   Current smoker   Cardiovascular and cancer risk associated with smoking discussed Smoking cessation advice given. Not motivated to quit      Patient Instructions  Diabetes: Healthy Eating for Adults When you have diabetes, also called diabetes mellitus, it's important to have healthy eating habits. Your blood sugar (glucose) levels are greatly affected by what you eat and drink. You need to eat healthy foods in the right amounts, at about the same times each day. Doing this can help you: Manage your blood sugar. Lower your risk of heart disease. Improve your blood pressure. Reach or stay at a healthy weight. What can affect my meal plan? Every person with diabetes  is different. And each person has different needs for a meal plan. Your health care provider may suggest that  you work with an Financial controller in healthy eating called a dietitian. They can help you make a meal plan that's best for you. How do carbohydrates affect me? Carbohydrates, also called carbs, affect your blood sugar level more than any other type of food. Eating carbs raises the amount of sugar in your blood. It's important to know how many carbs you can safely have in each meal. This is different for every person. Your dietitian can help you calculate how many carbs you should have at each meal and for each snack. How does alcohol affect me? Alcohol can cause a decrease in blood sugar (hypoglycemia), especially if you use insulin or take certain diabetes medicines by mouth. Hypoglycemia can be a life-threatening condition. Symptoms of hypoglycemia are similar to those of having too much alcohol. They include confusion, being sleepy, and feeling dizzy. Do not drink alcohol if: Your provider tells you not to drink. You're pregnant, may be pregnant, or plan to become pregnant. What are tips for following this plan? Reading food labels Start by checking the serving size on the Nutrition Facts label of packaged foods and drinks. The number of calories and the amount of carbs, fats, and other nutrients listed on the label are based on one serving of the item. Many items contain more than one serving per package. Check the total grams (g) of carbs in one serving. Check the number of grams of saturated fats and trans fats in one serving. Choose foods that have a low amount or none of these fats. Check the number of milligrams (mg) of salt (sodium) in one serving. Most people should limit their total sodium intake to less than 2,300 mg per day. Always check the nutrition information of foods labeled as low-fat or nonfat. These foods may be higher in added sugar or refined carbs and should be  avoided. Talk to your dietitian to identify your daily goals for nutrients listed on the label. Shopping Avoid buying canned, pre-made, or processed foods. These foods tend to be high in fat, sodium, and added sugar. Shop around the outside edge of the grocery store. This is where you'll most often find fresh fruits and vegetables, bulk grains, fresh meats, and fresh dairy products. Cooking Use low-heat cooking methods, such as baking, instead of high-heat methods like deep frying. Cook using healthy oils, such as olive, canola, or sunflower oil. Avoid cooking with butter, cream, or high-fat meats. Meal planning  Eat meals and snacks regularly. Try to eat them at the same times every day. Avoid going too long without eating. Eat foods that are high in fiber, such as fresh fruits, vegetables, beans, and whole grains. Eat 4-6 oz (112-168 g) of lean protein each day, such as lean meat, chicken, fish, eggs, or tofu. One ounce (oz) (28 g) of lean protein is equal to: 1 oz (28 g) of meat, chicken, or fish. 1 egg.  cup (62 g) of tofu. Eat some foods each day that contain healthy fats, such as avocado, nuts, seeds, and fish. What foods should I eat? Fruits Berries. Apples. Oranges. Peaches. Apricots. Plums. Grapes. Mangoes. Papayas. Pomegranates. Kiwi. Cherries. Vegetables Leafy greens, including lettuce, spinach, kale, chard, collard greens, mustard greens, and cabbage. Beets. Cauliflower. Broccoli. Carrots. Green beans. Tomatoes. Peppers. Onions. Cucumbers. Brussels sprouts. Grains Whole grains, such as whole-wheat or whole-grain bread, crackers, tortillas, cereal, and pasta. Unsweetened oatmeal. Quinoa. Brown or wild rice. Meats and other proteins Seafood. Poultry without skin. Lean cuts of poultry and beef. Tofu. Nuts. Seeds.  Dairy Low-fat or fat-free dairy products such as milk, yogurt, and cheese. The items listed above may not be all the foods and drinks you can have. Talk with a  dietitian to learn more. What foods should I avoid? Fruits Fruits canned with syrup. Vegetables Canned vegetables. Frozen vegetables with butter or cream sauce. Grains Refined white flour and flour products such as bread, pasta, snack foods, and cereals. Avoid all processed foods. Meats and other proteins Fatty cuts of meat. Poultry with skin. Breaded or fried meats. Processed meat. Avoid saturated fats. Dairy Full-fat yogurt, cheese, or milk. Beverages Sweetened drinks, such as soda or iced tea. The items listed above may not be all the foods and drinks you should avoid. Talk with a dietitian to learn more. Where to find more information: To learn more, go to: Academy of Nutrition and Dietetics at DeathPrevention.it. Click Search and type diabetes. Find the link you need. Centers for Disease Control and Prevention at TonerPromos.no. Click Search and type diabetes. Find the link you need. American Diabetes Association: diabetes.org/food-nutrition [employer] of Diabetes and Digestive and Kidney Diseases: StageSync.si This information is not intended to replace advice given to you by your health care provider. Make sure you discuss any questions you have with your health care provider. Document Revised: 06/11/2023 Document Reviewed: 06/11/2023 Elsevier Patient Education  2025 Elsevier Inc.       Emil Schaumann, MD [facility]

## 2024-03-10 NOTE — Assessment & Plan Note (Signed)
 Stable. Recent echocardiogram results reviewed. Sees cardiologist on a regular basi

## 2024-03-10 NOTE — Assessment & Plan Note (Signed)
 Cardiovascular and cancer risk associated with smoking discussed Smoking cessation advice given. Not motivated to quit

## 2024-03-10 NOTE — Patient Instructions (Signed)

## 2024-04-13 ENCOUNTER — Ambulatory Visit: Admitting: Podiatry

## 2024-04-13 ENCOUNTER — Encounter: Payer: Self-pay | Admitting: Podiatry

## 2024-04-13 DIAGNOSIS — E1142 Type 2 diabetes mellitus with diabetic polyneuropathy: Secondary | ICD-10-CM

## 2024-04-13 MED ORDER — GABAPENTIN 300 MG PO CAPS: 300.0000 mg | ORAL_CAPSULE | Freq: Every day | ORAL | 3 refills | Status: AC

## 2024-04-13 NOTE — Progress Notes (Signed)
  Subjective:  Patient ID: Stephanie Joseph, female    DOB: 05-Aug-1960,   MRN: [id number]  Chief Complaint  Patient presents with   Diabetes    My right foot gets numb at night.  Saw Dr. Emil Sagardia - 03/10/2024; A1c - 6.6    63 y.o. female presents for concern of numbness in feet. Relates this is mostly happening at night and in the right foot. Relates numb feeling and nothing else.  Does have some history of lower back issues.  Patient is diabetic and last A1c was  Lab Results  Component Value Date   HGBA1C 6.6 (A) 03/10/2024   .   PCP:  [facility] Emil Schanz, MD    . Denies any other pedal complaints. Denies n/v/f/c.   Past Medical History:  Diagnosis Date   Anemia, iron deficiency    Arthritis    Depression    Diabetes mellitus without complication (HCC)    Dysmenorrhea    Gout    Heart valve problem    Hypertension    Menorrhagia    Mitral regurgitation    Pneumonia     Objective:  Physical Exam: Vascular: DP/PT pulses 2/4 bilateral. CFT <3 seconds. Normal hair growth on digits. No edema.  Skin. No lacerations or abrasions bilateral feet.  Musculoskeletal: MMT 5/5 bilateral lower extremities in DF, PF, Inversion and Eversion. Deceased ROM in DF of ankle joint.  Neurological: Sensation intact to light touch.   Assessment:   1. Type 2 diabetes mellitus with peripheral neuropathy (HCC)      Plan:  Patient was evaluated and treated and all questions answered. Discussed neuropathy vs radiculopathy and etiology as well as treatment with patient.  -Discussed and educated patient on diabetic foot care, especially with  regards to the vascular, neurological and musculoskeletal systems.  -Stressed the importance of good glycemic control and the detriment of not  controlling glucose levels in relation to the foot. -Discussed supportive shoes at all times and checking feet regularly.  -Will try gabapentin 300 mg nightly to see if this helps. Discussed  likely coming from back so advised to follow-up on this as well.  -Patient to return in 3 months for follow-up evaluation.    Asberry Failing, DPM

## 2024-06-07 ENCOUNTER — Ambulatory Visit: Payer: Self-pay

## 2024-06-07 NOTE — Telephone Encounter (Signed)
 FYI Only or Action Required?: FYI only for provider: appointment scheduled on 06/08/24.  Patient was last seen in primary care on 03/10/2024 by [facility] Emil Schanz, MD.  Called Nurse Triage reporting Blood Sugar Problem.  Symptoms began several days ago.  Interventions attempted: Nothing.  Symptoms are: stable.  Triage Disposition: Call PCP Within 24 Hours  Patient/caregiver understands and will follow disposition?: Yes   Reason for Disposition  [1] Blood glucose 70 mg/dL (3.9 mmol/L) or below OR symptomatic, now improved with Care Advice AND [2] cause unknown  Answer Assessment - Initial Assessment Questions Patient states she's been feeling off since Thanksgiving, states a blood glucose level of 117 is low for her and she feels best when its 170. Patient states yesterday it was 170 before leaving the house and it was 110 when she got home.   1. SYMPTOMS: What symptoms are you concerned about?     Nausea, hot flashes, nervous  2. ONSET:  When did the symptoms start?     Off and on since Thanksgiving   3. BLOOD GLUCOSE: What is your blood glucose level?      117  4. USUAL RANGE: What is your blood glucose level usually? (e.g., usual fasting morning value, usual evening value)     170's  5. TYPE 1 or 2:  Do you know what type of diabetes you have?  (e.g., Type 1, Type 2, Gestational; doesn't know)      Type 2  6. INSULIN: Do you take insulin? What type of insulin(s) do you use? What is the mode of delivery? (syringe, pen; injection or pump) When did you last give yourself an insulin dose? (i.e., time or hours/minutes ago) How much did you give? (i.e., how many units)     Denies  7. DIABETES PILLS: Do you take any pills for your diabetes? If Yes, ask: What is the name of the medicine(s) that you take for high blood sugar?     No, but takes Monjaro once weekly   8. OTHER SYMPTOMS: Do you have any symptoms? (e.g., fever, frequent urination, difficulty  breathing, vomiting)     Denied  9. LOW BLOOD GLUCOSE TREATMENT: What have you done so far to treat the low blood glucose level?     Drink soda  10. FOOD: When did you last eat or drink?       Ate at 1030, and drank some soda and water just now  11. ALONE: Are you alone right now or is someone with you?        Husband is home  Protocols used: Diabetes - Low Blood Sugar-A-AH  Message from Alfonso ORN sent at 06/07/2024  3:21 PM EST  Reason for Triage: pt requesting call back from nurse stating blood sugar has dropped on and off since thanksgiving is currently 117 and has been feeling nauseous with hot flashes

## 2024-06-08 ENCOUNTER — Ambulatory Visit: Admitting: Emergency Medicine

## 2024-06-08 ENCOUNTER — Encounter: Payer: Self-pay | Admitting: Emergency Medicine

## 2024-06-08 VITALS — BP 124/80 | HR 99 | Temp 98.2°F | Ht 68.0 in | Wt 198.0 lb

## 2024-06-08 DIAGNOSIS — E1169 Type 2 diabetes mellitus with other specified complication: Secondary | ICD-10-CM

## 2024-06-08 DIAGNOSIS — I38 Endocarditis, valve unspecified: Secondary | ICD-10-CM

## 2024-06-08 DIAGNOSIS — F17200 Nicotine dependence, unspecified, uncomplicated: Secondary | ICD-10-CM

## 2024-06-08 DIAGNOSIS — E1159 Type 2 diabetes mellitus with other circulatory complications: Secondary | ICD-10-CM

## 2024-06-08 DIAGNOSIS — R5383 Other fatigue: Secondary | ICD-10-CM | POA: Insufficient documentation

## 2024-06-08 LAB — URINALYSIS
Hgb urine dipstick: NEGATIVE
Ketones, ur: NEGATIVE
Leukocytes,Ua: NEGATIVE
Nitrite: NEGATIVE
Specific Gravity, Urine: >=1.03 — AB (ref 1.000–1.030)
Urine Glucose: NEGATIVE
Urobilinogen, UA: 1 (ref 0.0–1.0)
pH: 6 (ref 5.0–8.0)

## 2024-06-08 LAB — CBC WITH DIFFERENTIAL/PLATELET
Basophils Absolute: 0 K/uL (ref 0.0–0.1)
Basophils Relative: 0.7 % (ref 0.0–3.0)
Eosinophils Absolute: 0.1 K/uL (ref 0.0–0.7)
Eosinophils Relative: 1.5 % (ref 0.0–5.0)
HCT: 37.5 % (ref 36.0–46.0)
Hemoglobin: 12.3 g/dL (ref 12.0–15.0)
Lymphocytes Relative: 29.5 % (ref 12.0–46.0)
Lymphs Abs: 1.7 K/uL (ref 0.7–4.0)
MCHC: 32.7 g/dL (ref 30.0–36.0)
MCV: 83 fl (ref 78.0–100.0)
Monocytes Absolute: 0.4 K/uL (ref 0.1–1.0)
Monocytes Relative: 7.7 % (ref 3.0–12.0)
Neutro Abs: 3.4 K/uL (ref 1.4–7.7)
Neutrophils Relative %: 60.6 % (ref 43.0–77.0)
Platelets: 271 K/uL (ref 150.0–400.0)
RBC: 4.52 Mil/uL (ref 3.87–5.11)
RDW: 14.9 % (ref 11.5–15.5)
WBC: 5.6 K/uL (ref 4.0–10.5)

## 2024-06-08 LAB — COMPREHENSIVE METABOLIC PANEL WITH GFR
ALT: 9 U/L (ref 0–35)
AST: 13 U/L (ref 0–37)
Albumin: 4.1 g/dL (ref 3.5–5.2)
Alkaline Phosphatase: 82 U/L (ref 39–117)
BUN: 8 mg/dL (ref 6–23)
CO2: 27 meq/L (ref 19–32)
Calcium: 9.7 mg/dL (ref 8.4–10.5)
Chloride: 102 meq/L (ref 96–112)
Creatinine, Ser: 0.82 mg/dL (ref 0.40–1.20)
GFR: 76.02 mL/min (ref >60.00)
Glucose, Bld: 137 mg/dL — ABNORMAL HIGH (ref 70–99)
Potassium: 3.3 meq/L — ABNORMAL LOW (ref 3.5–5.1)
Sodium: 140 meq/L (ref 135–145)
Total Bilirubin: 0.4 mg/dL (ref 0.2–1.2)
Total Protein: 7.4 g/dL (ref 6.0–8.3)

## 2024-06-08 LAB — POCT GLYCOSYLATED HEMOGLOBIN (HGB A1C): HbA1c POC (<> result, manual entry): 6.6 % (ref 4.0–5.6)

## 2024-06-08 LAB — LIPID PANEL
Cholesterol: 125 mg/dL (ref 0–200)
HDL: 64.1 mg/dL (ref >39.00)
LDL Cholesterol: 48 mg/dL (ref 0–99)
NonHDL: 61.38
Total CHOL/HDL Ratio: 2
Triglycerides: 68 mg/dL (ref 0.0–149.0)
VLDL: 13.6 mg/dL (ref 0.0–40.0)

## 2024-06-08 LAB — VITAMIN B12: Vitamin B-12: 251 pg/mL (ref 211–911)

## 2024-06-08 LAB — TSH: TSH: 0.6 u[IU]/mL (ref 0.35–5.50)

## 2024-06-08 LAB — VITAMIN D 25 HYDROXY (VIT D DEFICIENCY, FRACTURES): VITD: 20.52 ng/mL — ABNORMAL LOW (ref 30.00–100.00)

## 2024-06-08 NOTE — Progress Notes (Signed)
 Stephanie Joseph 63 y.o.   Chief Complaint  Patient presents with   Follow-up    Blood sugar problems and patient has not been feeling good  reading have been 117 between 170    HISTORY OF PRESENT ILLNESS: This is a 63 y.o. female here for follow-up of multiple chronic medical conditions including diabetes and hypertension Not feeling good.  Feels weak and tired Blood glucose at home between 110 and 170 No hypoglycemic episodes Not eating as much.  Lack of appetite.  Not taking in enough calories. No other complaints or medical concerns today. Wt Readings from Last 3 Encounters:  06/08/24 198 lb (89.8 kg)  03/10/24 204 lb (92.5 kg)  01/12/24 203 lb 12.8 oz (92.4 kg)   BP Readings from Last 3 Encounters:  06/08/24 124/80  03/10/24 110/72  01/12/24 116/78     HPI   Prior to Admission medications   Medication Sig Start Date End Date Taking? Authorizing Provider  albuterol  (VENTOLIN  HFA) 108 (90 Base) MCG/ACT inhaler Inhale 1-2 puffs into the lungs every 6 (six) hours as needed for wheezing or shortness of breath. 09/15/23   Christopher Savannah, PA-C  amLODipine  (NORVASC ) 10 MG tablet Take 1 tablet (10 mg total) by mouth daily. 10/08/23   Purcell Emil Schanz, MD  atorvastatin  (LIPITOR) 10 MG tablet Take 1 tablet (10 mg total) by mouth daily. 06/22/23   Purcell Emil Schanz, MD  azelastine  (ASTELIN ) 0.1 % nasal spray Place 1 spray into both nostrils 2 (two) times daily. Use in each nostril as directed Patient not taking: Reported on 04/13/2024 08/03/22   Vivienne Delon HERO, PA-C  brimonidine-timolol  (COMBIGAN) 0.2-0.5 % ophthalmic solution Place 1 drop into both eyes every 12 (twelve) hours.    [provider]  busPIRone  (BUSPAR ) 7.5 MG tablet Take 1 tablet (7.5 mg total) by mouth 2 (two) times daily. 10/27/22   Shevelle Smither Jose, MD  estrogen, conjugated,-medroxyprogesterone  (PREMPRO ) 0.3-1.5 MG tablet Take 1 tablet by mouth daily. 10/29/22   Zina Jerilynn LABOR, MD   fluticasone  (FLONASE ) 50 MCG/ACT nasal spray Place 2 sprays into both nostrils daily. 05/13/21   Wendee Lynwood HERO, NP  gabapentin  (NEURONTIN ) 300 MG capsule Take 1 capsule (300 mg total) by mouth at bedtime. 04/13/24   Joya Stabs, DPM  glimepiride  (AMARYL ) 2 MG tablet TAKE ONE TABLET BY MOUTH EVERY DAY WITH BREAKFAST. 12/22/23   Arbadella Kimbler Jose, MD  glucose blood (ACCU-CHEK AVIVA) test strip Use to check blood sugars 1-3 times daily. Dx Code: E11.9 01/20/18   Jason Leita Repine, FNP  hydrOXYzine  (ATARAX ) 25 MG tablet Take 1 tablet (25 mg total) by mouth every 8 (eight) hours as needed for itching. 04/01/22   Purcell Emil Schanz, MD  Lancets (ACCU-CHEK SOFT TOUCH) lancets Use as instructed 05/21/17   Kathlyne Romualdo Capers, NP  losartan -hydrochlorothiazide (HYZAAR) 100-12.5 MG tablet Take 1 tablet by mouth daily. 12/22/23   Purcell Emil Schanz, MD  promethazine -dextromethorphan (PROMETHAZINE -DM) 6.25-15 MG/5ML syrup Take 5 mLs by mouth 3 (three) times daily as needed for cough. Patient not taking: Reported on 04/13/2024 09/15/23   Christopher Savannah, PA-C  QUEtiapine  Fumarate (SEROQUEL  XR) 150 MG 24 hr tablet Take 1 tablet (150 mg total) by mouth at bedtime. 10/10/21   Penn, Reymundo, NP  tirzepatide  (MOUNJARO ) 7.5 MG/0.5ML Pen Inject 7.5 mg into the skin once a week. 11/09/23   Purcell Emil Schanz, MD  TRAVEL SICKNESS 25 MG CHEW CHEW ONE TABLET BY MOUTH THREE TIMES A DAY - AS NEEDED FOR  DIZZINESS (TABLETS MAY BE CHEWED OR SWALLOWED WHOLE) 06/23/22   Purcell Emil Schanz, MD  venlafaxine  (EFFEXOR ) 75 MG tablet Take 1 tablet (75 mg total) by mouth 3 (three) times daily with meals. 09/26/21   Sammy Molder, NP    No Known Allergies  Patient Active Problem List   Diagnosis Date Noted   Chronic left hip pain 04/01/2022   Hot flashes due to menopause 09/24/2021   Chronic midline low back pain 07/12/2018   Glaucoma 05/21/2017   Class 1 obesity due to excess calories with serious comorbidity and  body mass index (BMI) of 33.0 to 33.9 in adult 09/19/2016   Vitamin D  deficiency 09/19/2016   Dyslipidemia associated with type 2 diabetes mellitus (HCC) 07/10/2015   Hormone replacement therapy (postmenopausal) 10/04/2014   Mitral regurgitation 11/05/2012   Valvular heart disease 10/21/2011   Anxiety and depression 03/04/2010   GOUT 12/16/2006   ANEMIA, IRON DEFICIENCY NOS 12/16/2006   Current smoker 12/16/2006   Hypertension associated with diabetes (HCC) 12/16/2006    Past Medical History:  Diagnosis Date   Anemia, iron deficiency    Arthritis    Depression    Diabetes mellitus without complication (HCC)    Dysmenorrhea    Gout    Heart valve problem    Hypertension    Menorrhagia    Mitral regurgitation    Pneumonia     Past Surgical History:  Procedure Laterality Date   HAMMER TOE SURGERY     KNEE ARTHROSCOPY     Left    Social History   Socioeconomic History   Marital status: Married    Spouse name: Not on file   Number of children: 1   Years of education: 12   Highest education level: Not on file  Occupational History   Occupation: Disability Pending  Tobacco Use   Smoking status: Every Day    Current packs/day: 0.50    Average packs/day: 0.5 packs/day for 7.0 years (3.5 ttl pk-yrs)    Types: Cigarettes   Smokeless tobacco: Never  Vaping Use   Vaping status: Never Used  Substance and Sexual Activity   Alcohol use: No   Drug use: Not Currently   Sexual activity: Not Currently    Birth control/protection: None, Post-menopausal  Other Topics Concern   Not on file  Social History Narrative   Fun: Go to church   Denies abuse and feels safe at home.    Social Drivers of Corporate Investment Banker Strain: Not on file  Food Insecurity: Not on file  Transportation Needs: Not on file  Physical Activity: Not on file  Stress: Not on file  Social Connections: Not on file  Intimate Partner Violence: Not on file    Family History  Problem Relation  Age of Onset   Hypertension Mother    Heart disease Sister    Stroke Paternal Grandmother    Heart disease Brother    Breast cancer Neg Hx      Review of Systems  Constitutional:  Positive for malaise/fatigue. Negative for chills and fever.  HENT: Negative.  Negative for congestion and sore throat.   Respiratory: Negative.  Negative for cough and shortness of breath.   Cardiovascular: Negative.  Negative for chest pain and palpitations.  Gastrointestinal:  Negative for abdominal pain, diarrhea, nausea and vomiting.  Genitourinary: Negative.  Negative for dysuria and hematuria.  Skin: Negative.  Negative for rash.  Neurological: Negative.  Negative for dizziness and headaches.  All other systems  reviewed and are negative.   Vitals:   06/08/24 1042  BP: 124/80  Pulse: 99  Temp: 98.2 F (36.8 C)  SpO2: 98%    Physical Exam Vitals reviewed.  Constitutional:      Appearance: Normal appearance.  HENT:     Head: Normocephalic.     Mouth/Throat:     Mouth: Mucous membranes are moist.     Pharynx: Oropharynx is clear.  Eyes:     Extraocular Movements: Extraocular movements intact.     Conjunctiva/sclera: Conjunctivae normal.     Pupils: Pupils are equal, round, and reactive to light.  Cardiovascular:     Rate and Rhythm: Normal rate and regular rhythm.     Pulses: Normal pulses.     Heart sounds: Normal heart sounds.  Pulmonary:     Effort: Pulmonary effort is normal.     Breath sounds: Normal breath sounds.  Abdominal:     Palpations: Abdomen is soft.     Tenderness: There is no abdominal tenderness.  Musculoskeletal:     Cervical back: No tenderness.  Lymphadenopathy:     Cervical: No cervical adenopathy.  Skin:    General: Skin is warm and dry.     Capillary Refill: Capillary refill takes less than 2 seconds.  Neurological:     General: No focal deficit present.     Mental Status: She is alert and oriented to person, place, and time.  Psychiatric:        Mood  and Affect: Mood normal.        Behavior: Behavior normal.    Results for orders placed or performed in visit on 06/08/24 (from the past 24 hours)  POCT HgB A1C     Status: Abnormal   Collection Time: 06/08/24 10:55 AM  Result Value Ref Range   Hemoglobin A1C     HbA1c POC (<> result, manual entry) 6.6 4.0 - 5.6 %   HbA1c, POC (prediabetic range)     HbA1c, POC (controlled diabetic range)       ASSESSMENT & PLAN: A total of 40 minutes was spent with the patient and counseling/coordination of care regarding preparing for this visit, review of most recent office visit notes, review of multiple chronic medical conditions and their management, cardiovascular risks associated with hypertension and diabetes, differential diagnosis of tiredness and need to increase amount of daily caloric intake and protein intake, review of all medications, review of most recent bloodwork results including interpretation of today's hemoglobin A1c, review of health maintenance items, education on nutrition, prognosis, documentation, and need for follow up.   Problem List Items Addressed This Visit       Cardiovascular and Mediastinum   Hypertension associated with diabetes (HCC) - Primary   BP Readings from Last 3 Encounters:  06/08/24 124/80  03/10/24 110/72  01/12/24 116/78  Will troll hypertension Continue Hyzaar 100-12.5 mg daily and amlodipine  10 mg daily Well-controlled diabetes with hemoglobin A1c 6.6 Continue weekly Mounjaro  7.5 mg and daily glimepiride  2 mg Cardiovascular risks associated with hypertension and diabetes discussed Diet and nutrition discussed Blood work today Follow-up in 3 months       Relevant Orders   POCT HgB A1C (Completed)   CBC with Differential/Platelet   Comprehensive metabolic panel with GFR   Lipid panel   TSH   Vitamin B12   VITAMIN D  25 Hydroxy (Vit-D Deficiency, Fractures)   Valvular heart disease   Stable. Recent echocardiogram results reviewed. Sees  cardiologist on a regular basis History  of mitral valve regurgitation        Endocrine   Dyslipidemia associated with type 2 diabetes mellitus (HCC)   Chronic stable condition Continue atorvastatin  10 mg daily Diet and nutrition discussed Lipid profile done today      Relevant Orders   CBC with Differential/Platelet   Comprehensive metabolic panel with GFR   Lipid panel   TSH   Vitamin B12   VITAMIN D  25 Hydroxy (Vit-D Deficiency, Fractures)     Other   Current smoker   Cardiovascular and cancer risk associated with smoking discussed Smoking cessation advice given.       Relevant Orders   CBC with Differential/Platelet   Tiredness   Clinically stable.  No red flag signs or symptoms. Medication list reviewed with patient Continues weekly Mounjaro  which is suppressing her appetite As a result not taking it enough daily calories Diet and nutrition discussed Recommend blood work today Advised to stay well-hydrated and take at least 1500 cal daily Increase amount of daily protein intake      Relevant Orders   CBC with Differential/Platelet   Comprehensive metabolic panel with GFR   Lipid panel   TSH   Vitamin B12   VITAMIN D  25 Hydroxy (Vit-D Deficiency, Fractures)   Urinalysis   Patient Instructions  Not Eating Enough Protein, Fat, and Calories (Protein-Energy Malnutrition): What to Know Protein-energy malnutrition is when a person doesn't eat enough protein, fat, and calories. Over time, this can lead to severe muscle tissue loss, also called muscle wasting. It also affects the body's defense system (immune system) and can cause other health problems. What are the causes? Certain long-term (chronic) medical problems. Not eating enough. What increases the risk? Not being able to afford food. Staying in the hospital for a long time. Alcohol or drug dependency. Eating disorders, such as: Anorexia nervosa, often just called anorexia. Bulimia. Chewing or  swallowing problems. Certain conditions, such as: Inflammatory bowel disease. Cancer. AIDS. Chronic heart failure. Cystic fibrosis. Diets that restrict protein, fat, or calorie intake. What are the signs or symptoms? Feeling tired. Feeling weak or dizzy. Fainting. Weight loss. Loss of muscle tone and mass. Missed periods. Poor memory. Hair loss. Skin changes. How is this diagnosed? Protein-energy malnutrition may be diagnosed based on: Your medical history. Your diet history. A physical exam. This may include a measurement of your body mass index. Blood tests. How is this treated? Nutrition therapy. You may work with an financial controller in healthy eating called a dietitian to make an eating plan that works for you. Treating the problem that's causing malnutrition. Feeding assistance. This means you may have someone help you eat. Staying in the hospital if the malnutrition is very bad. You may need IV nutrition and fluids. Follow these instructions at home:  Add at least one food that's high in protein to each meal. These include: Meat, poultry, and fish. Eggs, cheese, and milk. Beans and nuts. Eat foods rich in nutrients that are easy to swallow and digest, such as: Fruit and yogurt smoothies. Oatmeal with nut butter. Nutrition supplement drinks. Eat smaller meals more often. For example, try to eat 6 small meals each day instead of 3 large meals. Take vitamin and protein supplements as told by your health care team. Exercise as told. Keep all follow-up visits. Your team may need to change your treatment plan over time. Contact a health care provider if: You have new symptoms. Your symptoms get worse. This information is not intended to replace advice given  to you by your health care provider. Make sure you discuss any questions you have with your health care provider. Document Revised: 02/15/2023 Document Reviewed: 02/15/2023 Elsevier Patient Education  2024 Else[employer]  Inc.     Emil Schaumann, MD [facility]

## 2024-06-08 NOTE — Patient Instructions (Signed)
 Not Eating Enough Protein, Fat, and Calories (Protein-Energy Malnutrition): What to Know Protein-energy malnutrition is when a person doesn't eat enough protein, fat, and calories. Over time, this can lead to severe muscle tissue loss, also called muscle wasting. It also affects the body's defense system (immune system) and can cause other health problems. What are the causes? Certain long-term (chronic) medical problems. Not eating enough. What increases the risk? Not being able to afford food. Staying in the hospital for a long time. Alcohol or drug dependency. Eating disorders, such as: Anorexia nervosa, often just called anorexia. Bulimia. Chewing or swallowing problems. Certain conditions, such as: Inflammatory bowel disease. Cancer. AIDS. Chronic heart failure. Cystic fibrosis. Diets that restrict protein, fat, or calorie intake. What are the signs or symptoms? Feeling tired. Feeling weak or dizzy. Fainting. Weight loss. Loss of muscle tone and mass. Missed periods. Poor memory. Hair loss. Skin changes. How is this diagnosed? Protein-energy malnutrition may be diagnosed based on: Your medical history. Your diet history. A physical exam. This may include a measurement of your body mass index. Blood tests. How is this treated? Nutrition therapy. You may work with an Financial controller in healthy eating called a dietitian to make an eating plan that works for you. Treating the problem that's causing malnutrition. Feeding assistance. This means you may have someone help you eat. Staying in the hospital if the malnutrition is very bad. You may need IV nutrition and fluids. Follow these instructions at home:  Add at least one food that's high in protein to each meal. These include: Meat, poultry, and fish. Eggs, cheese, and milk. Beans and nuts. Eat foods rich in nutrients that are easy to swallow and digest, such as: Fruit and yogurt smoothies. Oatmeal with nut  butter. Nutrition supplement drinks. Eat smaller meals more often. For example, try to eat 6 small meals each day instead of 3 large meals. Take vitamin and protein supplements as told by your health care team. Exercise as told. Keep all follow-up visits. Your team may need to change your treatment plan over time. Contact a health care provider if: You have new symptoms. Your symptoms get worse. This information is not intended to replace advice given to you by your health care provider. Make sure you discuss any questions you have with your health care provider. Document Revised: 02/15/2023 Document Reviewed: 02/15/2023 Elsevier Patient Education  2024 [employer].

## 2024-06-08 NOTE — Assessment & Plan Note (Signed)
Cardiovascular and cancer risk associated with smoking discussed. Smoking cessation advice given. 

## 2024-06-08 NOTE — Assessment & Plan Note (Signed)
 Clinically stable.  No red flag signs or symptoms. Medication list reviewed with patient Continues weekly Mounjaro  which is suppressing her appetite As a result not taking it enough daily calories Diet and nutrition discussed Recommend blood work today Advised to stay well-hydrated and take at least 1500 cal daily Increase amount of daily protein intake

## 2024-06-08 NOTE — Assessment & Plan Note (Signed)
 BP Readings from Last 3 Encounters:  06/08/24 124/80  03/10/24 110/72  01/12/24 116/78  Will troll hypertension Continue Hyzaar 100-12.5 mg daily and amlodipine  10 mg daily Well-controlled diabetes with hemoglobin A1c 6.6 Continue weekly Mounjaro  7.5 mg and daily glimepiride  2 mg Cardiovascular risks associated with hypertension and diabetes discussed Diet and nutrition discussed Blood work today Follow-up in 3 months

## 2024-06-08 NOTE — Assessment & Plan Note (Signed)
 Stable. Recent echocardiogram results reviewed. Sees cardiologist on a regular basis History of mitral valve regurgitation

## 2024-06-08 NOTE — Assessment & Plan Note (Signed)
Chronic stable condition. Continue atorvastatin 10 mg daily Diet and nutrition discussed. Lipid profile done today.

## 2024-06-09 ENCOUNTER — Ambulatory Visit: Payer: Self-pay | Admitting: Emergency Medicine

## 2024-06-09 NOTE — Telephone Encounter (Signed)
 Please advise

## 2024-06-09 NOTE — Telephone Encounter (Signed)
1500:

## 2024-07-27 ENCOUNTER — Other Ambulatory Visit: Payer: Self-pay | Admitting: Emergency Medicine

## 2024-07-27 DIAGNOSIS — Z1231 Encounter for screening mammogram for malignant neoplasm of breast: Secondary | ICD-10-CM

## 2024-09-12 ENCOUNTER — Ambulatory Visit

## 2024-09-15 ENCOUNTER — Ambulatory Visit
